# Patient Record
Sex: Female | Born: 1935 | Race: Black or African American | Hispanic: No | Smoking: Never smoker
Health system: Southern US, Community
[De-identification: ages and names within clinical notes are randomized; demographics above are authoritative.]

## PROBLEM LIST (undated history)

## (undated) DIAGNOSIS — M5430 Sciatica, unspecified side: Secondary | ICD-10-CM

## (undated) DIAGNOSIS — F0280 Dementia in other diseases classified elsewhere without behavioral disturbance: Secondary | ICD-10-CM

## (undated) DIAGNOSIS — M1990 Unspecified osteoarthritis, unspecified site: Secondary | ICD-10-CM

## (undated) DIAGNOSIS — H409 Unspecified glaucoma: Secondary | ICD-10-CM

## (undated) DIAGNOSIS — C50919 Malignant neoplasm of unspecified site of unspecified female breast: Secondary | ICD-10-CM

## (undated) DIAGNOSIS — R5381 Other malaise: Secondary | ICD-10-CM

## (undated) DIAGNOSIS — F329 Major depressive disorder, single episode, unspecified: Secondary | ICD-10-CM

## (undated) DIAGNOSIS — N183 Chronic kidney disease, stage 3 unspecified: Secondary | ICD-10-CM

## (undated) DIAGNOSIS — Z9181 History of falling: Secondary | ICD-10-CM

## (undated) DIAGNOSIS — K589 Irritable bowel syndrome without diarrhea: Secondary | ICD-10-CM

## (undated) DIAGNOSIS — K5730 Diverticulosis of large intestine without perforation or abscess without bleeding: Secondary | ICD-10-CM

## (undated) DIAGNOSIS — F32A Depression, unspecified: Secondary | ICD-10-CM

## (undated) DIAGNOSIS — D72819 Decreased white blood cell count, unspecified: Secondary | ICD-10-CM

## (undated) DIAGNOSIS — J069 Acute upper respiratory infection, unspecified: Secondary | ICD-10-CM

## (undated) DIAGNOSIS — L723 Sebaceous cyst: Secondary | ICD-10-CM

## (undated) DIAGNOSIS — M353 Polymyalgia rheumatica: Secondary | ICD-10-CM

## (undated) DIAGNOSIS — N3941 Urge incontinence: Secondary | ICD-10-CM

## (undated) DIAGNOSIS — E785 Hyperlipidemia, unspecified: Secondary | ICD-10-CM

## (undated) DIAGNOSIS — K5090 Crohn's disease, unspecified, without complications: Secondary | ICD-10-CM

## (undated) DIAGNOSIS — R269 Unspecified abnormalities of gait and mobility: Secondary | ICD-10-CM

## (undated) DIAGNOSIS — R42 Dizziness and giddiness: Secondary | ICD-10-CM

## (undated) DIAGNOSIS — D649 Anemia, unspecified: Secondary | ICD-10-CM

## (undated) DIAGNOSIS — I10 Essential (primary) hypertension: Secondary | ICD-10-CM

## (undated) DIAGNOSIS — K219 Gastro-esophageal reflux disease without esophagitis: Secondary | ICD-10-CM

## (undated) DIAGNOSIS — E538 Deficiency of other specified B group vitamins: Secondary | ICD-10-CM

## (undated) DIAGNOSIS — E559 Vitamin D deficiency, unspecified: Secondary | ICD-10-CM

## (undated) DIAGNOSIS — R5383 Other fatigue: Secondary | ICD-10-CM

## (undated) DIAGNOSIS — K5792 Diverticulitis of intestine, part unspecified, without perforation or abscess without bleeding: Secondary | ICD-10-CM

## (undated) DIAGNOSIS — R413 Other amnesia: Secondary | ICD-10-CM

## (undated) DIAGNOSIS — R600 Localized edema: Secondary | ICD-10-CM

## (undated) DIAGNOSIS — R635 Abnormal weight gain: Secondary | ICD-10-CM

## (undated) DIAGNOSIS — K37 Unspecified appendicitis: Secondary | ICD-10-CM

## (undated) DIAGNOSIS — M19019 Primary osteoarthritis, unspecified shoulder: Secondary | ICD-10-CM

## (undated) DIAGNOSIS — N9489 Other specified conditions associated with female genital organs and menstrual cycle: Secondary | ICD-10-CM

## (undated) HISTORY — PX: BREAST SURGERY: SHX581

## (undated) HISTORY — DX: Malignant neoplasm of unspecified site of unspecified female breast: C50.919

## (undated) HISTORY — DX: Major depressive disorder, single episode, unspecified: F32.9

## (undated) HISTORY — DX: Gastro-esophageal reflux disease without esophagitis: K21.9

## (undated) HISTORY — DX: Irritable bowel syndrome, unspecified: K58.9

## (undated) HISTORY — DX: Unspecified glaucoma: H40.9

## (undated) HISTORY — DX: Other malaise: R53.81

## (undated) HISTORY — DX: Depression, unspecified: F32.A

## (undated) HISTORY — DX: Sciatica, unspecified side: M54.30

## (undated) HISTORY — DX: Chronic kidney disease, stage 3 unspecified: N18.30

## (undated) HISTORY — DX: Essential (primary) hypertension: I10

## (undated) HISTORY — DX: Unspecified appendicitis: K37

## (undated) HISTORY — DX: Diverticulitis of intestine, part unspecified, without perforation or abscess without bleeding: K57.92

## (undated) HISTORY — DX: Vitamin D deficiency, unspecified: E55.9

## (undated) HISTORY — DX: Other specified conditions associated with female genital organs and menstrual cycle: N94.89

## (undated) HISTORY — DX: Polymyalgia rheumatica: M35.3

## (undated) HISTORY — DX: Localized edema: R60.0

## (undated) HISTORY — DX: Crohn's disease, unspecified, without complications: K50.90

## (undated) HISTORY — DX: Decreased white blood cell count, unspecified: D72.819

## (undated) HISTORY — DX: Dizziness and giddiness: R42

## (undated) HISTORY — DX: Other amnesia: R41.3

## (undated) HISTORY — DX: Dementia in other diseases classified elsewhere, unspecified severity, without behavioral disturbance, psychotic disturbance, mood disturbance, and anxiety: F02.80

## (undated) HISTORY — DX: Unspecified abnormalities of gait and mobility: R26.9

## (undated) HISTORY — DX: Urge incontinence: N39.41

## (undated) HISTORY — DX: Abnormal weight gain: R63.5

## (undated) HISTORY — DX: Acute upper respiratory infection, unspecified: J06.9

## (undated) HISTORY — DX: Primary osteoarthritis, unspecified shoulder: M19.019

## (undated) HISTORY — DX: Hyperlipidemia, unspecified: E78.5

## (undated) HISTORY — DX: Other fatigue: R53.83

## (undated) HISTORY — DX: Deficiency of other specified B group vitamins: E53.8

## (undated) HISTORY — DX: Anemia, unspecified: D64.9

## (undated) HISTORY — DX: History of falling: Z91.81

## (undated) HISTORY — DX: Chronic kidney disease, stage 3 (moderate): N18.3

## (undated) HISTORY — DX: Sebaceous cyst: L72.3

## (undated) HISTORY — DX: Diverticulosis of large intestine without perforation or abscess without bleeding: K57.30

---

## 1964-01-31 HISTORY — PX: NASAL SINUS SURGERY: SHX719

## 1970-01-30 HISTORY — PX: CHOLECYSTECTOMY: SHX55

## 1988-01-31 HISTORY — PX: CATARACT EXTRACTION: SUR2

## 2009-06-30 LAB — HM DEXA SCAN: HM Dexa Scan: NORMAL

## 2010-04-27 ENCOUNTER — Ambulatory Visit
Admission: RE | Admit: 2010-04-27 | Discharge: 2010-04-27 | Disposition: A | Discharge status: Home / Self Care | Payer: Medicare Other | Source: Ambulatory Visit | Attending: Internal Medicine | Admitting: Internal Medicine

## 2010-04-27 ENCOUNTER — Other Ambulatory Visit: Payer: Self-pay | Admitting: Internal Medicine

## 2010-04-27 DIAGNOSIS — R52 Pain, unspecified: Secondary | ICD-10-CM

## 2010-05-25 ENCOUNTER — Other Ambulatory Visit: Payer: Self-pay | Admitting: Internal Medicine

## 2010-05-25 ENCOUNTER — Ambulatory Visit
Admission: RE | Admit: 2010-05-25 | Discharge: 2010-05-25 | Disposition: A | Discharge status: Home / Self Care | Payer: Medicare Other | Source: Ambulatory Visit | Attending: Internal Medicine | Admitting: Internal Medicine

## 2010-05-25 DIAGNOSIS — M1990 Unspecified osteoarthritis, unspecified site: Secondary | ICD-10-CM

## 2010-08-26 ENCOUNTER — Other Ambulatory Visit: Payer: Self-pay | Admitting: Internal Medicine

## 2010-08-26 DIAGNOSIS — Z1231 Encounter for screening mammogram for malignant neoplasm of breast: Secondary | ICD-10-CM

## 2010-09-05 ENCOUNTER — Encounter: Payer: Medicare Other | Admitting: Oncology

## 2010-09-05 ENCOUNTER — Other Ambulatory Visit: Payer: Self-pay | Admitting: Oncology

## 2010-09-05 DIAGNOSIS — D72819 Decreased white blood cell count, unspecified: Secondary | ICD-10-CM

## 2010-09-05 LAB — HIV ANTIBODY (ROUTINE TESTING W REFLEX): HIV: NONREACTIVE

## 2010-09-05 LAB — CBC WITH DIFFERENTIAL/PLATELET
BASO%: 0.4 % (ref 0.0–2.0)
EOS%: 1.8 % (ref 0.0–7.0)
HCT: 33.2 % — ABNORMAL LOW (ref 34.8–46.6)
LYMPH%: 28.9 % (ref 14.0–49.7)
MCH: 31 pg (ref 25.1–34.0)
MCHC: 33.9 g/dL (ref 31.5–36.0)
NEUT%: 60.4 % (ref 38.4–76.8)
Platelets: 175 10*3/uL (ref 145–400)
RBC: 3.63 10*6/uL — ABNORMAL LOW (ref 3.70–5.45)
WBC: 3.6 10*3/uL — ABNORMAL LOW (ref 3.9–10.3)

## 2010-09-05 LAB — CHCC SMEAR

## 2010-09-16 ENCOUNTER — Ambulatory Visit
Admission: RE | Admit: 2010-09-16 | Discharge: 2010-09-16 | Disposition: A | Discharge status: Home / Self Care | Payer: Medicare Other | Source: Ambulatory Visit | Attending: Internal Medicine | Admitting: Internal Medicine

## 2010-09-16 DIAGNOSIS — Z1231 Encounter for screening mammogram for malignant neoplasm of breast: Secondary | ICD-10-CM

## 2010-09-21 ENCOUNTER — Other Ambulatory Visit: Payer: Self-pay | Admitting: Internal Medicine

## 2010-09-21 DIAGNOSIS — R928 Other abnormal and inconclusive findings on diagnostic imaging of breast: Secondary | ICD-10-CM

## 2010-09-27 ENCOUNTER — Ambulatory Visit
Admission: RE | Admit: 2010-09-27 | Discharge: 2010-09-27 | Disposition: A | Discharge status: Home / Self Care | Payer: Medicare Other | Source: Ambulatory Visit | Attending: Internal Medicine | Admitting: Internal Medicine

## 2010-09-27 ENCOUNTER — Other Ambulatory Visit: Payer: Self-pay | Admitting: Internal Medicine

## 2010-09-27 DIAGNOSIS — R928 Other abnormal and inconclusive findings on diagnostic imaging of breast: Secondary | ICD-10-CM

## 2010-09-27 DIAGNOSIS — R921 Mammographic calcification found on diagnostic imaging of breast: Secondary | ICD-10-CM

## 2010-09-30 ENCOUNTER — Ambulatory Visit
Admission: RE | Admit: 2010-09-30 | Discharge: 2010-09-30 | Disposition: A | Discharge status: Home / Self Care | Payer: Medicare Other | Source: Ambulatory Visit | Attending: Internal Medicine | Admitting: Internal Medicine

## 2010-09-30 ENCOUNTER — Other Ambulatory Visit: Payer: Self-pay | Admitting: Diagnostic Radiology

## 2010-09-30 DIAGNOSIS — R921 Mammographic calcification found on diagnostic imaging of breast: Secondary | ICD-10-CM

## 2010-10-04 ENCOUNTER — Other Ambulatory Visit: Payer: Self-pay | Admitting: Internal Medicine

## 2010-10-04 DIAGNOSIS — C50912 Malignant neoplasm of unspecified site of left female breast: Secondary | ICD-10-CM

## 2010-10-06 ENCOUNTER — Encounter (INDEPENDENT_AMBULATORY_CARE_PROVIDER_SITE_OTHER): Payer: Medicare Other | Admitting: Surgery

## 2010-10-19 ENCOUNTER — Ambulatory Visit (INDEPENDENT_AMBULATORY_CARE_PROVIDER_SITE_OTHER): Payer: Medicare Other | Admitting: General Surgery

## 2010-10-19 ENCOUNTER — Encounter: Payer: Medicare Other | Admitting: Oncology

## 2010-10-19 ENCOUNTER — Other Ambulatory Visit: Payer: Self-pay | Admitting: Oncology

## 2010-10-19 ENCOUNTER — Encounter (INDEPENDENT_AMBULATORY_CARE_PROVIDER_SITE_OTHER): Payer: Self-pay | Admitting: General Surgery

## 2010-10-19 ENCOUNTER — Ambulatory Visit: Payer: Medicare Other | Admitting: Physical Therapy

## 2010-10-19 ENCOUNTER — Ambulatory Visit (HOSPITAL_BASED_OUTPATIENT_CLINIC_OR_DEPARTMENT_OTHER): Payer: Medicare Other | Admitting: General Surgery

## 2010-10-19 ENCOUNTER — Encounter (HOSPITAL_BASED_OUTPATIENT_CLINIC_OR_DEPARTMENT_OTHER): Payer: Medicare Other | Admitting: Oncology

## 2010-10-19 VITALS — BP 138/77 | HR 62 | Temp 97.7°F | Resp 20 | Ht 64.0 in | Wt 173.8 lb

## 2010-10-19 DIAGNOSIS — D0510 Intraductal carcinoma in situ of unspecified breast: Secondary | ICD-10-CM

## 2010-10-19 DIAGNOSIS — D0590 Unspecified type of carcinoma in situ of unspecified breast: Secondary | ICD-10-CM

## 2010-10-19 DIAGNOSIS — D72819 Decreased white blood cell count, unspecified: Secondary | ICD-10-CM

## 2010-10-19 LAB — COMPREHENSIVE METABOLIC PANEL
ALT: 13 U/L (ref 0–35)
AST: 18 U/L (ref 0–37)
Albumin: 3.2 g/dL — ABNORMAL LOW (ref 3.5–5.2)
CO2: 27 mEq/L (ref 19–32)
Calcium: 9.2 mg/dL (ref 8.4–10.5)
Chloride: 105 mEq/L (ref 96–112)
Potassium: 4.5 mEq/L (ref 3.5–5.3)
Sodium: 140 mEq/L (ref 135–145)
Total Protein: 6.8 g/dL (ref 6.0–8.3)

## 2010-10-19 LAB — CBC WITH DIFFERENTIAL/PLATELET
BASO%: 0.3 % (ref 0.0–2.0)
EOS%: 1.5 % (ref 0.0–7.0)
HCT: 34.2 % — ABNORMAL LOW (ref 34.8–46.6)
MCH: 30.7 pg (ref 25.1–34.0)
MCHC: 33.4 g/dL (ref 31.5–36.0)
MONO#: 0.2 10*3/uL (ref 0.1–0.9)
RDW: 13.5 % (ref 11.2–14.5)
WBC: 3.3 10*3/uL — ABNORMAL LOW (ref 3.9–10.3)
lymph#: 0.9 10*3/uL (ref 0.9–3.3)

## 2010-10-19 NOTE — Patient Instructions (Signed)
DO NOT TAKE ASPIRIN OR IBUPROFEN/NAPROXEN WITHIN A WEEK BEFORE SURGERY.  The main risks of surgery are bleeding, infection, damage to other structures, and seroma (accumulation of fluid) under the incision site(s).    These complications may lead to additional procedures such as drainage of seroma/infection.  If cancer is found, you may need other surgeries to obtain negative margins or to take more lymph nodes.   Most women do accumulate fluid in the breast cavity where the specimen was removed. We do not always have to drain this fluid.  If your breast is very tense, painful, or red, then we may need to numb the skin and use a needle to aspirate the fluid.  We do provide patients with a Breast Binder.  The purpose of this is to avoid the use of tape on the sensitive tissue of the breast and to provide some compression to minimize the risk of seroma.  If the binder is uncomfortable, you may find that a tank top with a built-in shelf bra or a loose sports bra works better for you.  I recommend wearing this around the clock for the first 1-2 weeks except in the shower.    You may remove your dressings and may shower 48 hours after surgery     Many patients have some constipation in the week after surgery due to the narcotics and anesthesia.  You may need over the counter stool softeners or laxatives if you experience difficulty having bowel movements.    If the following occur, call our office at [phone]: If you have a fever over 101 or pain that is severe despite narcotics. If you have redness or drainage at the wound. If you develop persistent nausea or vomiting.  I will follow you back up in 1-4 weeks.    Please submit any paperwork about time off work/insurance forms to the front desk.

## 2010-10-19 NOTE — Progress Notes (Signed)
Chief Complaint  Patient presents with  . Breast Cancer    HISTORY: Patient is a 77 year old female who presents with a screening detected left breast mass.  She has had a prior R surgical breast biopsy, but has not had other relatives with breast cancer.  She has eliminated most of her medications.  Her menses was onset at age 14.  She is post menopausal.  She is accompanied by her daughter Michelle [name].  She does not have a palpable abnormality.  This is in the lower outer quadrant of her L breast.  Biopsy demonstrated high grade DCIS.  MRI negative for other lesions.    Past Medical History  Diagnosis Date  . Hypertension   . Hyperlipidemia   . Polymyalgia rheumatica   . GERD (gastroesophageal reflux disease)   . Irritable bowel syndrome   . Diverticulitis   . Sciatica   . Vertigo   . Edema of leg   . Glaucoma   . Appendicitis     Past Surgical History  Procedure Date  . Nasal sinus surgery 1966  . Cholecystectomy 1972  . Cataract extraction 1990  . Breast surgery     R breast lumpectomy for benign disease    Current Outpatient Prescriptions  Medication Sig Dispense Refill  . amLODipine (NORVASC) 5 MG tablet Take 5 mg by mouth daily.        Marland Kitchen aspirin 81 MG chewable tablet Chew 81 mg by mouth daily.        . calcium carbonate 200 MG capsule Take 250 mg by mouth 2 (two) times daily with a meal.        . docusate-casanthranol (PERICOLACE) 100-30 MG per capsule Take 1 capsule by mouth 2 (two) times daily.        Marland Kitchen lovastatin (MEVACOR) 20 MG tablet Take 20 mg by mouth at bedtime.        . Multiple Vitamin (MULTIVITAMIN) capsule Take 1 capsule by mouth daily.        Marland Kitchen omeprazole (PRILOSEC) 10 MG capsule Take 10 mg by mouth daily.        . psyllium (REGULOID) 0.52 G capsule Take 0.52 g by mouth 2 (two) times daily.        Marland Kitchen tolterodine (DETROL LA) 4 MG 24 hr capsule Take 4 mg by mouth 4 (four) times a week.           Allergies  Allergen Reactions  . Codeine     States she blacks  out     Family History  Problem Relation Age of Onset  . Stroke Father   . Hypertension Mother   . Stroke Mother   . Breast cancer Mother   . Heart attack Brother   . Cancer Brother   . Stroke Brother   . Heart attack Son   . Colon cancer Sister      History   Social History  . Marital Status: Widowed    Spouse Name: N/A    Number of Children: N/A  . Years of Education: N/A   Occupational History  . retired    Social History Main Topics  . Smoking status: Never Smoker   . Smokeless tobacco: None  . Alcohol Use: No  . Drug Use: None  . Sexually Active: None   Other Topics Concern  . None   Social History Narrative  . None     REVIEW OF SYSTEMS - PERTINENT POSITIVES ONLY: 12 point review of systems negative other  than HPI and PMH except for shoulder pain, incontinence, and arthritis.    EXAM: Filed Vitals:   10/19/10 1338  BP: 138/77  Pulse: 62  Temp: 97.7 F (36.5 C)  Resp: 20    Gen:  No acute distress.  Well nourished and well groomed.   Neurological: Alert and oriented to person, place, and time. Coordination normal.  Head: Normocephalic and atraumatic.  Eyes: Conjunctivae are normal. Pupils are equal, round, and reactive to light. No scleral icterus.  Neck: Normal range of motion. Neck supple. No tracheal deviation or thyromegaly present.  Cardiovascular: Normal rate, regular rhythm, normal heart sounds and intact distal pulses.  Exam reveals no gallop and no friction rub.  No murmur heard. Breast:  Symmetric ptotic breasts bilaterally.  Biopsy site hematoma at lower left breast 6 o'clock.  No axillary adenopathy.  No masses palpable in either breast.  R breast with old incision upper breast, well healed.   Respiratory: Effort normal.  No respiratory distress. No chest wall tenderness. Breath sounds normal.  No wheezes, rales or rhonchi.  GI: Soft. Bowel sounds are normal. The abdomen is soft and nontender.  There is no rebound and no guarding.    Musculoskeletal: Decreased range of motion R shoulder.  Antalgic gait.   Extremities are nontender.  Lymphadenopathy: No cervical, preauricular, postauricular or axillary adenopathy is present Skin: Skin is warm and dry. No rash noted. No diaphoresis. No erythema. No pallor. No clubbing, cyanosis, or edema.   Psychiatric: Normal mood and affect. Behavior is normal. Judgment and thought content normal.    LABORATORY RESULTS: Available labs are reviewed. CBC with mild anemia.   RADIOLOGY RESULTS: See E-Chart for most recent results.  Images and reports are reviewed.   ASSESSMENT AND PLAN: Left DCIS (ductal carcinoma in situ), lower inner quadrant L breast, +/+ HR Recommend needle localized excisional lumpectomy.    The surgical procedure was described to the patient.  I discussed the incision type and location and whether we would need radiology involved on the day of surgery with a wire marker and/or sentinel node.      The risks and benefits of the procedure were described to the patient and he/she wishes to proceed.    We discussed the risks bleeding, infection, damage to other structures, need for further procedures/surgeries.  We discussed the risk of seroma.  The patient was advised if the area in the breast in cancer, we may need to go back to surgery for additional tissue to obtain negative margins or for a lymph node biopsy. The patient was advised that these are the most common complications, but that others can occur as well.  They were advised against taking aspirin or other anti-inflammatory agents/blood thinners the week before surgery.          Maudry Diego MD Surgical Oncology, General and Endocrine Surgery [facility]      Visit Diagnoses: 1. Ductal carcinoma in situ of breast   2. Left DCIS (ductal carcinoma in situ), lower inner quadrant L breast, +/+ HR     Primary Care Physician: Doran Durand, MD

## 2010-10-19 NOTE — Assessment & Plan Note (Signed)
Recommend needle localized excisional lumpectomy.    The surgical procedure was described to the patient.  I discussed the incision type and location and whether we would need radiology involved on the day of surgery with a wire marker and/or sentinel node.      The risks and benefits of the procedure were described to the patient and he/she wishes to proceed.    We discussed the risks bleeding, infection, damage to other structures, need for further procedures/surgeries.  We discussed the risk of seroma.  The patient was advised if the area in the breast in cancer, we may need to go back to surgery for additional tissue to obtain negative margins or for a lymph node biopsy. The patient was advised that these are the most common complications, but that others can occur as well.  They were advised against taking aspirin or other anti-inflammatory agents/blood thinners the week before surgery.

## 2010-10-20 ENCOUNTER — Other Ambulatory Visit (INDEPENDENT_AMBULATORY_CARE_PROVIDER_SITE_OTHER): Payer: Self-pay | Admitting: General Surgery

## 2010-10-20 DIAGNOSIS — C50919 Malignant neoplasm of unspecified site of unspecified female breast: Secondary | ICD-10-CM

## 2010-10-25 ENCOUNTER — Encounter (HOSPITAL_COMMUNITY)
Admission: RE | Admit: 2010-10-25 | Discharge: 2010-10-25 | Disposition: A | Discharge status: Home / Self Care | Payer: Medicare Other | Source: Ambulatory Visit | Attending: General Surgery | Admitting: General Surgery

## 2010-10-25 LAB — CBC
HCT: 35.1 % — ABNORMAL LOW (ref 36.0–46.0)
MCHC: 31.9 g/dL (ref 30.0–36.0)
Platelets: 160 10*3/uL (ref 150–400)
RDW: 13.2 % (ref 11.5–15.5)
WBC: 3.8 10*3/uL — ABNORMAL LOW (ref 4.0–10.5)

## 2010-10-25 LAB — BASIC METABOLIC PANEL
BUN: 30 mg/dL — ABNORMAL HIGH (ref 6–23)
GFR calc Af Amer: 49 mL/min — ABNORMAL LOW (ref >60)
GFR calc non Af Amer: 41 mL/min — ABNORMAL LOW (ref >60)
Potassium: 4.9 mEq/L (ref 3.5–5.1)
Sodium: 141 mEq/L (ref 135–145)

## 2010-10-25 LAB — SURGICAL PCR SCREEN: MRSA, PCR: NEGATIVE

## 2010-10-26 NOTE — Progress Notes (Signed)
Quick Note:  Labs ok for surgery ______ 

## 2010-10-28 ENCOUNTER — Ambulatory Visit
Admission: RE | Admit: 2010-10-28 | Discharge: 2010-10-28 | Disposition: A | Discharge status: Home / Self Care | Payer: Medicare Other | Source: Ambulatory Visit | Attending: General Surgery | Admitting: General Surgery

## 2010-10-28 ENCOUNTER — Other Ambulatory Visit (INDEPENDENT_AMBULATORY_CARE_PROVIDER_SITE_OTHER): Payer: Self-pay | Admitting: General Surgery

## 2010-10-28 ENCOUNTER — Ambulatory Visit (HOSPITAL_COMMUNITY)
Admission: RE | Admit: 2010-10-28 | Discharge: 2010-10-28 | Disposition: A | Discharge status: Home / Self Care | Payer: Medicare Other | Source: Ambulatory Visit | Attending: General Surgery | Admitting: General Surgery

## 2010-10-28 DIAGNOSIS — C50919 Malignant neoplasm of unspecified site of unspecified female breast: Secondary | ICD-10-CM

## 2010-10-28 DIAGNOSIS — K589 Irritable bowel syndrome without diarrhea: Secondary | ICD-10-CM | POA: Insufficient documentation

## 2010-10-28 DIAGNOSIS — H409 Unspecified glaucoma: Secondary | ICD-10-CM | POA: Insufficient documentation

## 2010-10-28 DIAGNOSIS — Z01812 Encounter for preprocedural laboratory examination: Secondary | ICD-10-CM | POA: Insufficient documentation

## 2010-10-28 DIAGNOSIS — D0590 Unspecified type of carcinoma in situ of unspecified breast: Secondary | ICD-10-CM

## 2010-10-28 DIAGNOSIS — K5730 Diverticulosis of large intestine without perforation or abscess without bleeding: Secondary | ICD-10-CM | POA: Insufficient documentation

## 2010-10-28 DIAGNOSIS — Z01810 Encounter for preprocedural cardiovascular examination: Secondary | ICD-10-CM | POA: Insufficient documentation

## 2010-10-28 HISTORY — PX: BREAST LUMPECTOMY: SHX2

## 2010-11-04 ENCOUNTER — Telehealth (INDEPENDENT_AMBULATORY_CARE_PROVIDER_SITE_OTHER): Payer: Self-pay

## 2010-11-04 NOTE — Telephone Encounter (Signed)
Patient daughter called and said her mother is getting a cleaning at the dentist office and they need medical clearance from Dr. Donell Beers since she just had surgery. I told her I would fax clearance when I got it to [phone]. I paged Donell Beers but have not had a call back yet.

## 2010-11-07 ENCOUNTER — Telehealth (INDEPENDENT_AMBULATORY_CARE_PROVIDER_SITE_OTHER): Payer: Self-pay | Admitting: General Surgery

## 2010-11-07 NOTE — Telephone Encounter (Signed)
[name] [name]. DAUGHTER-IN-LAW OF PT Michelle [name] CALLED TO REQUEST NOTE FAXED RE OK FOR DENTAL CLEANING AFTER SURGERY. DR. Marilynne Halsted NOTIFIED AND SAID OK FOR DENTAL CLEANING PROCEDURE. NOTE FAXED TO [phone] PER REQUEST.

## 2010-11-14 ENCOUNTER — Other Ambulatory Visit (INDEPENDENT_AMBULATORY_CARE_PROVIDER_SITE_OTHER): Payer: Self-pay

## 2010-11-14 ENCOUNTER — Other Ambulatory Visit (INDEPENDENT_AMBULATORY_CARE_PROVIDER_SITE_OTHER): Payer: Self-pay | Admitting: General Surgery

## 2010-11-14 ENCOUNTER — Ambulatory Visit (INDEPENDENT_AMBULATORY_CARE_PROVIDER_SITE_OTHER): Payer: Medicare Other | Admitting: General Surgery

## 2010-11-14 ENCOUNTER — Encounter (INDEPENDENT_AMBULATORY_CARE_PROVIDER_SITE_OTHER): Payer: Self-pay | Admitting: General Surgery

## 2010-11-14 VITALS — BP 106/78 | HR 64 | Temp 96.8°F | Resp 14 | Ht 64.0 in | Wt 174.2 lb

## 2010-11-14 DIAGNOSIS — D0590 Unspecified type of carcinoma in situ of unspecified breast: Secondary | ICD-10-CM

## 2010-11-14 DIAGNOSIS — D0510 Intraductal carcinoma in situ of unspecified breast: Secondary | ICD-10-CM

## 2010-11-14 MED ORDER — HYDROCODONE-ACETAMINOPHEN 5-325 MG PO TABS: 1.0000 | ORAL_TABLET | Freq: Four times a day (QID) | ORAL | Status: DC | PRN

## 2010-11-14 NOTE — Progress Notes (Signed)
HISTORY: Pt doing well post operatively.  She has had significant arthritis pain in her shoulders, which is not unexpected.  She is having minimal breast pain.  She denies fever/chills.  She is not sure when her follow up is with Dr. Darnelle Catalan.  Pt was discussed at breast conference, but at that time, there was an understanding that her lateral margin was close.  This is not the case.    PERTINENT REVIEW OF SYSTEMS: See HPI.  EXAM: Head: Normocephalic and atraumatic.  Eyes:  Conjunctivae are normal. Pupils are equal, round, and reactive to light. No scleral icterus.  Resp: No respiratory distress, normal effort. Breast:  L breast without large seroma or infection.  Incision healing well.    Neurological: Alert and oriented to person, place, and time. Antalgic gait, using walker.  Skin: Skin is warm and dry. No rash noted. No diaphoretic. No erythema. No pallor.  Psychiatric: Normal mood and affect. Normal behavior. Judgment and thought content normal.     Pathology reviewed and demonstrates: High grade DCIS with comedonecrosis. Anterior margin <1 mm (from skin) Other margins negative.    ASSESSMENT AND PLAN:   Left DCIS (ductal carcinoma in situ),LIQ L breast, +/+ HR, s/p lumpectomy 10/28/2010 Surgical scars appear to be healing well. Released to Dr. Darnelle Catalan. Discussed with Dr. Mitzi Hansen and Dr. Colonel Bald.  Only close margin is anterior margin which is skin.  Lateral margin is not less than 2 mm. Follow up in 6 months.      Maudry Diego, MD Surgical Oncology, General & Endocrine Surgery [facility]  Doran Durand, MD Doran Durand, MD

## 2010-11-14 NOTE — Patient Instructions (Signed)
Follow up with Dr. Darnelle Catalan to discuss anti hormone treatment.

## 2010-11-14 NOTE — Assessment & Plan Note (Signed)
Surgical scars appear to be healing well. Released to Dr. Darnelle Catalan. Discussed with Dr. Mitzi Hansen and Dr. Colonel Bald.  Only close margin is anterior margin which is skin.  Lateral margin is not less than 2 mm. Follow up in 6 months.

## 2010-11-15 NOTE — Op Note (Signed)
  [name], Michelle [name] NO.:  [id number]  MEDICAL RECORD NO.:  [id number]  LOCATION:  [facility]                         FACILITY:  [facility]  PHYSICIAN:  Almond Lint, MD       DATE OF BIRTH:  October 02, 1920  DATE OF PROCEDURE:  10/28/2010 DATE OF DISCHARGE:                              OPERATIVE REPORT   PREOPERATIVE DIAGNOSIS:  Left breast ductal carcinoma in situ.  POSTOPERATIVE DIAGNOSIS:  Left breast ductal carcinoma in situ.  PROCEDURE:  Left needle-localized lumpectomy.  SURGEON:  Almond Lint, MD  ASSISTANT:  None.  ANESTHESIA:  General and local.  FINDINGS:  Clips and wire in the specimen.  SPECIMEN:  Left breast lumpectomy to Pathology.  ESTIMATED BLOOD LOSS:  Minimal.  COMPLICATIONS:  None known.  PROCEDURE:  [name] was identified in the holding area and taken to operating room where she was placed supine on the operating room table. General anesthesia was induced.  Her left breast was prepped and draped in sterile fashion.  Time-out was performed according to the surgical safety check list.  When all was correct, we continued.  The area where the wires were was identified and this was cut a little bit shorter to be more manageable.  The skin was anesthetized over the top of this and the skin was incised with a #10 blade.  Skin hooks were used to elevate the skin and thick flaps were created superiorly, inferiorly over thinner flaps created.  The skin overlying the mass was very thin.  The wire was identified and pulled through the skin into this incision.  This breast tissue was then elevated with 2 Allis clamps.  The curved Mayo scissors were used to take the specimen out.  The wire was not encountered.  This was taken in the shape of a cylinder.   The specimen was marked with the margin marker paint kit and then passed off the table for specimen mammogram.  Clips and the wire were in the specimen.  Radiology confirmed that this was accurate.   The posterior portion of the specimen was firm and so an additional posterior and medial margin were taken.  These were marked with a margin marker paint kit.  These were passed off the table.  Hemostasis was achieved with the cautery.  The wound was irrigated.  Large clips were placed in the cavity with one posterior, one medial, one lateral, one superior, and one inferior.  The skin was then reapproximated with 3-0 Vicryl deep dermal interrupted sutures and 4-0 Monocryl running subcuticular sutures.  The wound was cleaned, dried, and dressed with Steri-Strips, gauze, ABDs, and a breast binder.  The patient was awakened from anesthesia and taken to the PACU in stable condition.  Needle, sponge, and instrument counts were correct.     Almond Lint, MD     FB/MEDQ  D:  10/28/2010  T:  10/28/2010  Job:  [id number]  Electronically Signed by Almond Lint MD on 11/15/2010 07:38:20 PM

## 2010-11-23 ENCOUNTER — Encounter (HOSPITAL_BASED_OUTPATIENT_CLINIC_OR_DEPARTMENT_OTHER): Payer: Medicare Other | Admitting: Oncology

## 2010-11-23 DIAGNOSIS — D0590 Unspecified type of carcinoma in situ of unspecified breast: Secondary | ICD-10-CM

## 2010-11-23 DIAGNOSIS — Z170 Estrogen receptor positive status [ER+]: Secondary | ICD-10-CM

## 2010-12-05 ENCOUNTER — Other Ambulatory Visit: Payer: Medicare Other | Admitting: Lab

## 2011-01-14 ENCOUNTER — Telehealth: Payer: Self-pay | Admitting: Oncology

## 2011-01-14 NOTE — Telephone Encounter (Signed)
will mail pts [phone] to her home on 01/14/2011

## 2011-02-12 ENCOUNTER — Encounter (HOSPITAL_COMMUNITY): Payer: Self-pay

## 2011-02-12 ENCOUNTER — Other Ambulatory Visit: Payer: Self-pay

## 2011-02-12 ENCOUNTER — Emergency Department (HOSPITAL_COMMUNITY): Payer: Medicare Other

## 2011-02-12 ENCOUNTER — Observation Stay (HOSPITAL_COMMUNITY)
Admission: EM | Admit: 2011-02-12 | Discharge: 2011-02-13 | Disposition: A | Discharge status: Home / Self Care | Payer: Medicare Other | Attending: Internal Medicine | Admitting: Internal Medicine

## 2011-02-12 DIAGNOSIS — R5383 Other fatigue: Secondary | ICD-10-CM | POA: Diagnosis not present

## 2011-02-12 DIAGNOSIS — T1490XA Injury, unspecified, initial encounter: Secondary | ICD-10-CM | POA: Diagnosis not present

## 2011-02-12 DIAGNOSIS — M25559 Pain in unspecified hip: Secondary | ICD-10-CM | POA: Insufficient documentation

## 2011-02-12 DIAGNOSIS — M47817 Spondylosis without myelopathy or radiculopathy, lumbosacral region: Secondary | ICD-10-CM | POA: Insufficient documentation

## 2011-02-12 DIAGNOSIS — R262 Difficulty in walking, not elsewhere classified: Secondary | ICD-10-CM | POA: Diagnosis present

## 2011-02-12 DIAGNOSIS — M353 Polymyalgia rheumatica: Secondary | ICD-10-CM | POA: Diagnosis present

## 2011-02-12 DIAGNOSIS — W19XXXA Unspecified fall, initial encounter: Secondary | ICD-10-CM | POA: Diagnosis present

## 2011-02-12 DIAGNOSIS — N390 Urinary tract infection, site not specified: Principal | ICD-10-CM | POA: Diagnosis present

## 2011-02-12 DIAGNOSIS — Y92009 Unspecified place in unspecified non-institutional (private) residence as the place of occurrence of the external cause: Secondary | ICD-10-CM | POA: Insufficient documentation

## 2011-02-12 DIAGNOSIS — I10 Essential (primary) hypertension: Secondary | ICD-10-CM | POA: Insufficient documentation

## 2011-02-12 DIAGNOSIS — R5381 Other malaise: Secondary | ICD-10-CM | POA: Diagnosis not present

## 2011-02-12 DIAGNOSIS — E782 Mixed hyperlipidemia: Secondary | ICD-10-CM | POA: Diagnosis not present

## 2011-02-12 DIAGNOSIS — I517 Cardiomegaly: Secondary | ICD-10-CM | POA: Diagnosis not present

## 2011-02-12 DIAGNOSIS — J9819 Other pulmonary collapse: Secondary | ICD-10-CM | POA: Diagnosis not present

## 2011-02-12 DIAGNOSIS — R404 Transient alteration of awareness: Secondary | ICD-10-CM | POA: Diagnosis not present

## 2011-02-12 DIAGNOSIS — D0510 Intraductal carcinoma in situ of unspecified breast: Secondary | ICD-10-CM | POA: Diagnosis present

## 2011-02-12 HISTORY — DX: Unspecified osteoarthritis, unspecified site: M19.90

## 2011-02-12 LAB — URINALYSIS, ROUTINE W REFLEX MICROSCOPIC
Bilirubin Urine: NEGATIVE
Nitrite: POSITIVE — AB
Specific Gravity, Urine: 1.012 (ref 1.005–1.030)
Urobilinogen, UA: 0.2 mg/dL (ref 0.0–1.0)
pH: 7.5 (ref 5.0–8.0)

## 2011-02-12 LAB — URINE MICROSCOPIC-ADD ON

## 2011-02-12 LAB — CBC
HCT: 36.1 % (ref 36.0–46.0)
MCH: 30.1 pg (ref 26.0–34.0)
MCV: 93.8 fL (ref 78.0–100.0)
Platelets: 170 10*3/uL (ref 150–400)
RBC: 3.85 MIL/uL — ABNORMAL LOW (ref 3.87–5.11)
RDW: 13.2 % (ref 11.5–15.5)

## 2011-02-12 LAB — COMPREHENSIVE METABOLIC PANEL
ALT: 12 U/L (ref 0–35)
CO2: 25 mEq/L (ref 19–32)
Calcium: 10.1 mg/dL (ref 8.4–10.5)
Creatinine, Ser: 1.38 mg/dL — ABNORMAL HIGH (ref 0.50–1.10)
GFR calc Af Amer: 38 mL/min — ABNORMAL LOW (ref >90)
GFR calc non Af Amer: 33 mL/min — ABNORMAL LOW (ref >90)
Glucose, Bld: 100 mg/dL — ABNORMAL HIGH (ref 70–99)
Sodium: 139 mEq/L (ref 135–145)
Total Protein: 7.3 g/dL (ref 6.0–8.3)

## 2011-02-12 LAB — URINE CULTURE: Culture  Setup Time: 201301140157

## 2011-02-12 LAB — DIFFERENTIAL
Eosinophils Absolute: 0 10*3/uL (ref 0.0–0.7)
Eosinophils Relative: 1 % (ref 0–5)
Lymphs Abs: 1.3 10*3/uL (ref 0.7–4.0)
Monocytes Absolute: 0.4 10*3/uL (ref 0.1–1.0)

## 2011-02-12 MED ORDER — HYDROCODONE-ACETAMINOPHEN 5-325 MG PO TABS
1.0000 | ORAL_TABLET | Freq: Once | ORAL | Status: AC
  Administered 2011-02-12: 1 via ORAL
  Filled 2011-02-12: qty 1

## 2011-02-12 MED ORDER — SODIUM CHLORIDE 0.9 % IV SOLN
INTRAVENOUS | Status: DC
  Administered 2011-02-12: 125 mL/h via INTRAVENOUS

## 2011-02-12 MED ORDER — SODIUM CHLORIDE 0.9 % IV SOLN: INTRAVENOUS | Status: DC

## 2011-02-12 NOTE — H&P (Signed)
Michelle [name] is an 78 y.o. female.   PCP - [facility]. Chief Complaint: Difficulty walking. HPI: 78 year-old female with history of breast cancer status post lumpectomy last month, history of polymyalgia rheumatica off steroids (patient does not recall if she was on steroids), hypertension had a fall today in the assisted-living facility where she lives. Patient states that she tried to pick the glasses which fell on the floor. In doing so she fell on the floor but did not lose consciousness. Subsequent to which she felt very weak and not able to walk. Patient was brought to the ER and in the ER patient was found to be requiring 2 people to help  his walk and her legs give way. Patient does not have any low back pain did not have any incontinence of urine or bowel. Denies any neck pain. She does have chronic shoulder pain which she states is not new. A CT of the head and neck has been ordered by the ER physicians, results are still pending. Patient will be admitted for further observation and management. In addition patient has UTI. Patient denies any chest pain, shortness of breath, nausea vomiting, abdominal pain, dysuria discharges or diarrhea.  Past Medical History  Diagnosis Date  . Hypertension   . Hyperlipidemia   . Polymyalgia rheumatica   . GERD (gastroesophageal reflux disease)   . Irritable bowel syndrome   . Diverticulitis   . Sciatica   . Vertigo   . Edema of leg   . Glaucoma   . Appendicitis   . Arthritis     Past Surgical History  Procedure Date  . Nasal sinus surgery 1966  . Cholecystectomy 1972  . Cataract extraction 1990  . Breast surgery     R breast lumpectomy for benign disease  . Breast lumpectomy 10/28/10    left breast     Family History  Problem Relation Age of Onset  . Stroke Father   . Hypertension Mother   . Stroke Mother   . Breast cancer Mother   . Heart attack Brother   . Cancer Brother   . Stroke Brother   . Heart attack Son   .  Colon cancer Sister    Social History:  reports that she has never smoked. She has never used smokeless tobacco. She reports that she does not drink alcohol or use illicit drugs.  Allergies:  Allergies  Allergen Reactions  . Codeine     States she blacks out    Medications Prior to Admission  Medication Dose Route Frequency Provider Last Rate Last Dose  . 0.9 %  sodium chloride infusion   Intravenous Continuous Carleene Cooper III, MD 125 mL/hr at 02/12/11 1833 125 mL/hr at 02/12/11 1833  . 0.9 %  sodium chloride infusion   Intravenous Continuous Carleene Cooper III, MD      . HYDROcodone-acetaminophen [facility]) 5-325 MG per tablet 1 tablet  1 tablet Oral Once Carleene Cooper III, MD   1 tablet at 02/12/11 2246   Medications Prior to Admission  Medication Sig Dispense Refill  . aspirin 81 MG chewable tablet Chew 81 mg by mouth daily.        . bimatoprost (LUMIGAN) 0.01 % SOLN Place 1 drop into both eyes at bedtime.       . brimonidine-timolol (COMBIGAN) 0.2-0.5 % ophthalmic solution Place 1 drop into both eyes 2 (two) times daily.       . calcium carbonate 200 MG capsule Take 250 mg by  mouth 2 (two) times daily with a meal.        . dorzolamide (TRUSOPT) 2 % ophthalmic solution Place 1 drop into both eyes 2 (two) times daily.        Marland Kitchen esomeprazole (NEXIUM) 20 MG capsule Take 20 mg by mouth daily before breakfast.        . Multiple Vitamin (MULTIVITAMIN) capsule Take 1 capsule by mouth daily.        Marland Kitchen tolterodine (DETROL LA) 4 MG 24 hr capsule Take 4 mg by mouth 4 (four) times a week.       Marland Kitchen HYDROcodone-acetaminophen (NORCO) 5-325 MG per tablet Take 1 tablet by mouth every 6 (six) hours as needed.  30 tablet  0    Results for orders placed during the hospital encounter of 02/12/11 (from the past 48 hour(s))  CBC     Status: Abnormal   Collection Time   02/12/11  6:28 PM      Component Value Range Comment   WBC 5.0  4.0 - 10.5 (K/uL)    RBC 3.85 (*) 3.87 - 5.11 (MIL/uL)    Hemoglobin 11.6  (*) 12.0 - 15.0 (g/dL)    HCT 21.3  08.6 - 57.8 (%)    MCV 93.8  78.0 - 100.0 (fL)    MCH 30.1  26.0 - 34.0 (pg)    MCHC 32.1  30.0 - 36.0 (g/dL)    RDW 46.9  62.9 - 52.8 (%)    Platelets 170  150 - 400 (K/uL)   DIFFERENTIAL     Status: Normal   Collection Time   02/12/11  6:28 PM      Component Value Range Comment   Neutrophils Relative 66  43 - 77 (%)    Neutro Abs 3.3  1.7 - 7.7 (K/uL)    Lymphocytes Relative 26  12 - 46 (%)    Lymphs Abs 1.3  0.7 - 4.0 (K/uL)    Monocytes Relative 8  3 - 12 (%)    Monocytes Absolute 0.4  0.1 - 1.0 (K/uL)    Eosinophils Relative 1  0 - 5 (%)    Eosinophils Absolute 0.0  0.0 - 0.7 (K/uL)    Basophils Relative 0  0 - 1 (%)    Basophils Absolute 0.0  0.0 - 0.1 (K/uL)   COMPREHENSIVE METABOLIC PANEL     Status: Abnormal   Collection Time   02/12/11  6:28 PM      Component Value Range Comment   Sodium 139  135 - 145 (mEq/L)    Potassium 4.9  3.5 - 5.1 (mEq/L)    Chloride 105  96 - 112 (mEq/L)    CO2 25  19 - 32 (mEq/L)    Glucose, Bld 100 (*) 70 - 99 (mg/dL)    BUN 34 (*) 6 - 23 (mg/dL)    Creatinine, Ser 4.13 (*) 0.50 - 1.10 (mg/dL)    Calcium 24.4  8.4 - 10.5 (mg/dL)    Total Protein 7.3  6.0 - 8.3 (g/dL)    Albumin 3.8  3.5 - 5.2 (g/dL)    AST 21  0 - 37 (U/L)    ALT 12  0 - 35 (U/L)    Alkaline Phosphatase 63  39 - 117 (U/L)    Total Bilirubin 0.5  0.3 - 1.2 (mg/dL)    GFR calc non Af Amer 33 (*) >90 (mL/min)    GFR calc Af Amer 38 (*) >90 (mL/min)   URINALYSIS, ROUTINE W  REFLEX MICROSCOPIC     Status: Abnormal   Collection Time   02/12/11  7:06 PM      Component Value Range Comment   Color, Urine YELLOW  YELLOW     APPearance CLOUDY (*) CLEAR     Specific Gravity, Urine 1.012  1.005 - 1.030     pH 7.5  5.0 - 8.0     Glucose, UA NEGATIVE  NEGATIVE (mg/dL)    Hgb urine dipstick NEGATIVE  NEGATIVE     Bilirubin Urine NEGATIVE  NEGATIVE     Ketones, ur NEGATIVE  NEGATIVE (mg/dL)    Protein, ur NEGATIVE  NEGATIVE (mg/dL)     Urobilinogen, UA 0.2  0.0 - 1.0 (mg/dL)    Nitrite POSITIVE (*) NEGATIVE     Leukocytes, UA LARGE (*) NEGATIVE    URINE MICROSCOPIC-ADD ON     Status: Abnormal   Collection Time   02/12/11  7:06 PM      Component Value Range Comment   Squamous Epithelial / LPF FEW (*) RARE     WBC, UA 11-20  <3 (WBC/hpf)    Bacteria, UA MANY (*) RARE     Dg Chest 1 View  02/12/2011  *RADIOLOGY REPORT*  Clinical Data: Fall, right hip pain  CHEST - 1 VIEW  Comparison: 04/27/2010  Findings: Heart is enlarged with vascular congestion and basilar atelectasis.  Low lung volumes noted.  No definite CHF, pneumonia, collapse, consolidation, effusion, or pneumothorax.  Degenerative changes of the spine.  Atherosclerosis of the aorta.  IMPRESSION: Cardiomegaly with vascular congestion.  Low lung volumes.  Original Report Authenticated By: Judie Petit. Ruel Favors, M.D.   Dg Hip Complete Right  02/12/2011  *RADIOLOGY REPORT*  Clinical Data: Fall, right hip pain  RIGHT HIP - COMPLETE 2+ VIEW  Comparison: None.  Findings: Mild degenerative changes of the lower lumbar spine, pelvis and hips.  Symmetric hips and normal alignment.  No acute displaced fracture or trabecular pattern disruption.  Pelvis intact.  No diastasis.  IMPRESSION: No acute finding by plain radiography.  Original Report Authenticated By: Judie Petit. Ruel Favors, M.D.    Review of Systems  HENT: Negative.   Eyes: Negative.   Respiratory: Negative.   Cardiovascular: Negative.   Gastrointestinal: Negative.   Musculoskeletal: Positive for joint pain.       Both shoulder hurts on abducting.  Skin: Negative.   Neurological: Positive for weakness.  Psychiatric/Behavioral: Negative.     Blood pressure 174/84, pulse 60, temperature 97.3 F (36.3 C), temperature source Oral, resp. rate 13, height 5\' 4"  (1.626 m), weight 79.833 kg (176 lb), SpO2 100.00%. Physical Exam  Constitutional: She is oriented to person, place, and time. She appears well-developed and well-nourished.  No distress.  HENT:  Head: Normocephalic and atraumatic.  Right Ear: External ear normal.  Left Ear: External ear normal.  Nose: Nose normal.  Mouth/Throat: Oropharynx is clear and moist. No oropharyngeal exudate.  Eyes: Conjunctivae and EOM are normal. Pupils are equal, round, and reactive to light. Right eye exhibits no discharge. Left eye exhibits no discharge. No scleral icterus.  Neck: Normal range of motion. Neck supple.  Cardiovascular: Normal rate, regular rhythm and normal heart sounds.   Respiratory: Effort normal and breath sounds normal. No respiratory distress. She has no wheezes. She has no rales.  GI: Soft. Bowel sounds are normal. She exhibits no distension. There is no tenderness. There is no rebound.  Musculoskeletal: She exhibits no edema.       Both shoulders have  pain on abducting. Patient has good strength in all extremities.  Neurological: She is alert and oriented to person, place, and time. She has normal reflexes.       Moves all extremities. Upper extremity movements are limited by shoulder pain. Patient states the shoulder pain are chronic. I don't see any effusion in the joints. There is no local tenderness. Patient has no obvious facial asymmetry. Patient has got good reflexes.  Skin: Skin is warm and dry. No rash noted. She is not diaphoretic. No erythema.  Psychiatric: Her behavior is normal.     Assessment/Plan #1. Difficulty walking - patient has good reflexes. The CT head and CT C-spine results are still pending, results have to be followed. Given her history of breast cancer we have to rule out myositis (like dermatomyositis/polymyositis) so we will check a CK level, and given the history of polymyalgia rheumatica we will check a sedimentation rate and CRP levels. For now will hydrate patient and also get a PT OT consult. Further recommendations based on the tests results. #2. UTI - continue ceftriaxone check urine culture. #3. History of hypertension -  continue present medications.  CODE STATUS - full code.  Eduard Clos. 02/12/2011, 11:41 PM

## 2011-02-12 NOTE — ED Notes (Signed)
Pt started c/o rt hip pain when the ERMD was examining the patient

## 2011-02-12 NOTE — ED Notes (Signed)
Patient report given to [facility], RN.  Patient moved to [place].

## 2011-02-12 NOTE — ED Provider Notes (Cosign Needed Addendum)
History     CSN: [id number]  Arrival date & time 02/12/11  1742   First MD Initiated Contact with Patient 02/12/11 1804      Chief Complaint  Patient presents with  . Weakness  . Fall    (Consider location/radiation/quality/duration/timing/severity/associated sxs/prior treatment) HPI Comments: The patient is an 78 year old woman who said that she fell on the floor. She could not get up. She called EMS, who helped her get up. When she got up she couldn't stand because she was shaking. She says she fell on her right hip, and has mild pain over the right hip. EMS brought her to [facility] for evaluation. There was no loss of consciousness. She had no chest pain, unilateral weakness, or difficulty with speech. There was no prior history of falls.  Patient is a 78 y.o. female presenting with fall. The history is provided by the patient. No language interpreter was used.  Fall The accident occurred less than 1 hour ago. The fall occurred while walking. She fell from a height of 1 to 2 ft. She landed on carpet. There was no blood loss. The point of impact was the right hip. The pain is present in the right hip. The pain is mild. She was not ambulatory at the scene. There was no entrapment after the fall. There was no drug use involved in the accident. There was no alcohol use involved in the accident. Pertinent negatives include no fever. The symptoms are aggravated by standing. Prehospitalization: Transported to ED by EMS.     Past Medical History  Diagnosis Date  . Hypertension   . Hyperlipidemia   . Polymyalgia rheumatica   . GERD (gastroesophageal reflux disease)   . Irritable bowel syndrome   . Diverticulitis   . Sciatica   . Vertigo   . Edema of leg   . Glaucoma   . Appendicitis   . Arthritis     Past Surgical History  Procedure Date  . Nasal sinus surgery 1966  . Cholecystectomy 1972  . Cataract extraction 1990  . Breast surgery     R breast lumpectomy for benign  disease  . Breast lumpectomy 10/28/10    left breast     Family History  Problem Relation Age of Onset  . Stroke Father   . Hypertension Mother   . Stroke Mother   . Breast cancer Mother   . Heart attack Brother   . Cancer Brother   . Stroke Brother   . Heart attack Son   . Colon cancer Sister     History  Substance Use Topics  . Smoking status: Never Smoker   . Smokeless tobacco: Never Used  . Alcohol Use: No    OB History    Grav Para Term Preterm Abortions TAB SAB Ect Mult Living                  Review of Systems  Constitutional: Negative for fever.  HENT: Negative.   Eyes: Negative.   Respiratory: Negative.   Cardiovascular: Negative.   Gastrointestinal: Negative.   Musculoskeletal:       Right hip pain.  Skin: Negative.   Neurological: Positive for weakness.  Psychiatric/Behavioral: Negative.     Allergies  Codeine  Home Medications   Current Outpatient Rx  Name Route Sig Dispense Refill  . AMLODIPINE BESYLATE 2.5 MG PO TABS Oral Take 5 mg by mouth daily.    . ASPIRIN 81 MG PO CHEW Oral Chew 81 mg  by mouth daily.      Marland Kitchen BIMATOPROST 0.01 % OP SOLN  1 drop at bedtime.      Marland Kitchen BRIMONIDINE TARTRATE-TIMOLOL 0.2-0.5 % OP SOLN Both Eyes Place 1 drop into both eyes 2 (two) times daily.     Marland Kitchen CITALOPRAM HYDROBROMIDE 10 MG PO TABS Oral Take by mouth daily. Pt does not remember the dose    . DORZOLAMIDE HCL 2 % OP SOLN Both Eyes Place 1 drop into both eyes 2 (two) times daily.      Marland Kitchen ESOMEPRAZOLE MAGNESIUM 20 MG PO CPDR Oral Take 20 mg by mouth daily before breakfast.      . LETROZOLE 2.5 MG PO TABS Oral Take by mouth daily. Pt does not remember    . MULTIVITAMINS PO CAPS Oral Take 1 capsule by mouth daily.      . TOLTERODINE TARTRATE ER 4 MG PO CP24 Oral Take 4 mg by mouth 4 (four) times a week.     Marland Kitchen CALCIUM CARBONATE 200 MG PO CAPS Oral Take 250 mg by mouth 2 (two) times daily with a meal.      . HYDROCODONE-ACETAMINOPHEN 5-325 MG PO TABS Oral Take 1 tablet  by mouth every 6 (six) hours as needed. 30 tablet 0    BP 171/78  Pulse 60  Temp(Src) 98.3 F (36.8 C) (Oral)  Resp 18  Ht 5\' 4"  (1.626 m)  Wt 176 lb (79.833 kg)  BMI 30.21 kg/m2  SpO2 100%  Physical Exam  Nursing note and vitals reviewed. Constitutional: She is oriented to person, place, and time.       Is an elderly lady who speaks with a mild [place] accident. She is in no distress at rest.  HENT:  Head: Normocephalic and atraumatic.  Right Ear: External ear normal.  Left Ear: External ear normal.  Nose: Nose normal.  Mouth/Throat: Oropharynx is clear and moist.  Eyes: Conjunctivae and EOM are normal. Pupils are equal, round, and reactive to light.  Neck: Normal range of motion. Neck supple.  Cardiovascular: Normal rate, regular rhythm and normal heart sounds.   Pulmonary/Chest: Effort normal and breath sounds normal.  Abdominal: Soft. Bowel sounds are normal. She exhibits no distension. There is no tenderness.  Musculoskeletal:       She localizes pain over the right greater trochanter. Range of motion of the hip is full, without apparent pain. She has no bony deformity of her arms, thighs, lower legs ankles or feet.  Neurological: She is alert and oriented to person, place, and time.       No sensory or motor deficit.  Skin: Skin is warm and dry.  Psychiatric: She has a normal mood and affect. Her behavior is normal.    ED Course  Procedures (including critical care time)   Labs Reviewed  CBC  DIFFERENTIAL  COMPREHENSIVE METABOLIC PANEL  URINALYSIS, ROUTINE W REFLEX MICROSCOPIC  URINE CULTURE   6:20 PM Patient was seen and had physical examination. Laboratory tests and x-rays were ordered. Old charts were reviewed; she had had a left lumpectomy for carcinoma in situ of the breast last Fall.  7:08 PM  Date: 02/12/2011  Rate: 57  Rhythm: normal sinus rhythm  QRS Axis: left  Intervals: normal QRS:  Left ventricular hypertrophy  ST/T Wave abnormalities:  normal  Conduction Disutrbances:none  Narrative Interpretation: Abnormal EKG  Old EKG Reviewed: unchanged  9:03 PM Results for orders placed during the hospital encounter of 02/12/11  CBC  Component Value Range   WBC 5.0  4.0 - 10.5 (K/uL)   RBC 3.85 (*) 3.87 - 5.11 (MIL/uL)   Hemoglobin 11.6 (*) 12.0 - 15.0 (g/dL)   HCT 11.9  14.7 - 82.9 (%)   MCV 93.8  78.0 - 100.0 (fL)   MCH 30.1  26.0 - 34.0 (pg)   MCHC 32.1  30.0 - 36.0 (g/dL)   RDW 56.2  13.0 - 86.5 (%)   Platelets 170  150 - 400 (K/uL)  DIFFERENTIAL      Component Value Range   Neutrophils Relative 66  43 - 77 (%)   Neutro Abs 3.3  1.7 - 7.7 (K/uL)   Lymphocytes Relative 26  12 - 46 (%)   Lymphs Abs 1.3  0.7 - 4.0 (K/uL)   Monocytes Relative 8  3 - 12 (%)   Monocytes Absolute 0.4  0.1 - 1.0 (K/uL)   Eosinophils Relative 1  0 - 5 (%)   Eosinophils Absolute 0.0  0.0 - 0.7 (K/uL)   Basophils Relative 0  0 - 1 (%)   Basophils Absolute 0.0  0.0 - 0.1 (K/uL)  COMPREHENSIVE METABOLIC PANEL      Component Value Range   Sodium 139  135 - 145 (mEq/L)   Potassium 4.9  3.5 - 5.1 (mEq/L)   Chloride 105  96 - 112 (mEq/L)   CO2 25  19 - 32 (mEq/L)   Glucose, Bld 100 (*) 70 - 99 (mg/dL)   BUN 34 (*) 6 - 23 (mg/dL)   Creatinine, Ser 7.84 (*) 0.50 - 1.10 (mg/dL)   Calcium 69.6  8.4 - 10.5 (mg/dL)   Total Protein 7.3  6.0 - 8.3 (g/dL)   Albumin 3.8  3.5 - 5.2 (g/dL)   AST 21  0 - 37 (U/L)   ALT 12  0 - 35 (U/L)   Alkaline Phosphatase 63  39 - 117 (U/L)   Total Bilirubin 0.5  0.3 - 1.2 (mg/dL)   GFR calc non Af Amer 33 (*) >90 (mL/min)   GFR calc Af Amer 38 (*) >90 (mL/min)  URINALYSIS, ROUTINE W REFLEX MICROSCOPIC      Component Value Range   Color, Urine YELLOW  YELLOW    APPearance CLOUDY (*) CLEAR    Specific Gravity, Urine 1.012  1.005 - 1.030    pH 7.5  5.0 - 8.0    Glucose, UA NEGATIVE  NEGATIVE (mg/dL)   Hgb urine dipstick NEGATIVE  NEGATIVE    Bilirubin Urine NEGATIVE  NEGATIVE    Ketones, ur NEGATIVE   NEGATIVE (mg/dL)   Protein, ur NEGATIVE  NEGATIVE (mg/dL)   Urobilinogen, UA 0.2  0.0 - 1.0 (mg/dL)   Nitrite POSITIVE (*) NEGATIVE    Leukocytes, UA LARGE (*) NEGATIVE   URINE MICROSCOPIC-ADD ON      Component Value Range   Squamous Epithelial / LPF FEW (*) RARE    WBC, UA 11-20  <3 (WBC/hpf)   Bacteria, UA MANY (*) RARE    Dg Chest 1 View  02/12/2011  *RADIOLOGY REPORT*  Clinical Data: Fall, right hip pain  CHEST - 1 VIEW  Comparison: 04/27/2010  Findings: Heart is enlarged with vascular congestion and basilar atelectasis.  Low lung volumes noted.  No definite CHF, pneumonia, collapse, consolidation, effusion, or pneumothorax.  Degenerative changes of the spine.  Atherosclerosis of the aorta.  IMPRESSION: Cardiomegaly with vascular congestion.  Low lung volumes.  Original Report Authenticated By: Judie Petit. Ruel Favors, M.D.   Dg Hip Complete Right  02/12/2011  *RADIOLOGY REPORT*  Clinical Data: Fall, right hip pain  RIGHT HIP - COMPLETE 2+ VIEW  Comparison: None.  Findings: Mild degenerative changes of the lower lumbar spine, pelvis and hips.  Symmetric hips and normal alignment.  No acute displaced fracture or trabecular pattern disruption.  Pelvis intact.  No diastasis.  IMPRESSION: No acute finding by plain radiography.  Original Report Authenticated By: Judie Petit. Ruel Favors, M.D.    Lab tests showed a urinary tract infection. X-ray showed no fracture.  Will ask for orthostatic VS and to have pt walk.  If she can walk all right, can go home on po antibiotics.   9:22 PM Pt unable to stand, RN thinks due to weakness.  Will order IV fluids and work towards admission.   1. Fall   2. Urinary tract infection           Carleene Cooper III, MD 02/12/11 1827  Carleene Cooper III, MD 02/12/11 [phone]

## 2011-02-12 NOTE — ED Notes (Signed)
EMS claimed that the patient was found on the floor. Pt used her  alert button and initially just wanted to be helped off the floor but patient was unable to move as normal. Pt A/A/Ox4, denies any dizziness, no LOC no chest pain

## 2011-02-12 NOTE — ED Notes (Signed)
Attempted to give report to [place].  Will call back in 15 minutes.

## 2011-02-12 NOTE — ED Notes (Signed)
MD at bedside. 

## 2011-02-12 NOTE — ED Notes (Signed)
Dr.Davidson given copy of ecg. 

## 2011-02-13 ENCOUNTER — Encounter (HOSPITAL_COMMUNITY): Payer: Self-pay | Admitting: *Deleted

## 2011-02-13 DIAGNOSIS — R262 Difficulty in walking, not elsewhere classified: Secondary | ICD-10-CM | POA: Diagnosis not present

## 2011-02-13 DIAGNOSIS — I10 Essential (primary) hypertension: Secondary | ICD-10-CM | POA: Diagnosis not present

## 2011-02-13 DIAGNOSIS — E782 Mixed hyperlipidemia: Secondary | ICD-10-CM | POA: Diagnosis not present

## 2011-02-13 DIAGNOSIS — M353 Polymyalgia rheumatica: Secondary | ICD-10-CM | POA: Diagnosis not present

## 2011-02-13 DIAGNOSIS — W19XXXA Unspecified fall, initial encounter: Secondary | ICD-10-CM | POA: Diagnosis present

## 2011-02-13 DIAGNOSIS — N390 Urinary tract infection, site not specified: Secondary | ICD-10-CM | POA: Diagnosis present

## 2011-02-13 LAB — SEDIMENTATION RATE: Sed Rate: 40 mm/hr — ABNORMAL HIGH (ref 0–22)

## 2011-02-13 LAB — COMPREHENSIVE METABOLIC PANEL
AST: 24 U/L (ref 0–37)
Albumin: 2.9 g/dL — ABNORMAL LOW (ref 3.5–5.2)
Calcium: 9.1 mg/dL (ref 8.4–10.5)
Chloride: 107 mEq/L (ref 96–112)
Creatinine, Ser: 1.08 mg/dL (ref 0.50–1.10)
Sodium: 140 mEq/L (ref 135–145)

## 2011-02-13 LAB — CBC
HCT: 34.6 % — ABNORMAL LOW (ref 36.0–46.0)
Hemoglobin: 10.9 g/dL — ABNORMAL LOW (ref 12.0–15.0)
MCH: 29.6 pg (ref 26.0–34.0)
MCV: 94 fL (ref 78.0–100.0)
RBC: 3.68 MIL/uL — ABNORMAL LOW (ref 3.87–5.11)

## 2011-02-13 LAB — URINE CULTURE
Colony Count: >=100000
Culture  Setup Time: 201301140837

## 2011-02-13 MED ORDER — TIMOLOL MALEATE 0.5 % OP SOLN
1.0000 [drp] | Freq: Two times a day (BID) | OPHTHALMIC | Status: DC
  Administered 2011-02-13: 1 [drp] via OPHTHALMIC
  Filled 2011-02-13: qty 5

## 2011-02-13 MED ORDER — ADULT MULTIVITAMIN W/MINERALS CH
1.0000 | ORAL_TABLET | Freq: Every day | ORAL | Status: DC
  Administered 2011-02-13: 1 via ORAL
  Filled 2011-02-13: qty 1

## 2011-02-13 MED ORDER — ACETAMINOPHEN 650 MG RE SUPP: 650.0000 mg | Freq: Four times a day (QID) | RECTAL | Status: DC | PRN

## 2011-02-13 MED ORDER — TOLTERODINE TARTRATE ER 4 MG PO CP24: 4.0000 mg | ORAL_CAPSULE | ORAL | Status: DC

## 2011-02-13 MED ORDER — BIMATOPROST 0.01 % OP SOLN
1.0000 [drp] | Freq: Every day | OPHTHALMIC | Status: DC
  Filled 2011-02-13: qty 5

## 2011-02-13 MED ORDER — SODIUM CHLORIDE 0.9 % IV SOLN
INTRAVENOUS | Status: DC
  Administered 2011-02-13: 03:00:00 via INTRAVENOUS

## 2011-02-13 MED ORDER — AMLODIPINE BESYLATE 5 MG PO TABS
5.0000 mg | ORAL_TABLET | Freq: Every day | ORAL | Status: DC
  Administered 2011-02-13: 5 mg via ORAL
  Filled 2011-02-13: qty 1

## 2011-02-13 MED ORDER — LETROZOLE 2.5 MG PO TABS
2.5000 mg | ORAL_TABLET | Freq: Every day | ORAL | Status: DC
  Administered 2011-02-13: 2.5 mg via ORAL
  Filled 2011-02-13: qty 1

## 2011-02-13 MED ORDER — BRIMONIDINE TARTRATE 0.2 % OP SOLN
1.0000 [drp] | Freq: Two times a day (BID) | OPHTHALMIC | Status: DC
  Administered 2011-02-13: 1 [drp] via OPHTHALMIC
  Filled 2011-02-13: qty 5

## 2011-02-13 MED ORDER — CIPROFLOXACIN HCL 500 MG PO TABS: 500.0000 mg | ORAL_TABLET | Freq: Two times a day (BID) | ORAL | Status: AC

## 2011-02-13 MED ORDER — HYDROCODONE-ACETAMINOPHEN 5-325 MG PO TABS: 1.0000 | ORAL_TABLET | Freq: Four times a day (QID) | ORAL | Status: DC | PRN

## 2011-02-13 MED ORDER — BRIMONIDINE TARTRATE-TIMOLOL 0.2-0.5 % OP SOLN: 1.0000 [drp] | Freq: Two times a day (BID) | OPHTHALMIC | Status: DC

## 2011-02-13 MED ORDER — ONDANSETRON HCL 4 MG/2ML IJ SOLN: 4.0000 mg | Freq: Four times a day (QID) | INTRAMUSCULAR | Status: DC | PRN

## 2011-02-13 MED ORDER — CITALOPRAM HYDROBROMIDE 10 MG PO TABS
10.0000 mg | ORAL_TABLET | Freq: Every day | ORAL | Status: DC
  Administered 2011-02-13: 10 mg via ORAL
  Filled 2011-02-13: qty 1

## 2011-02-13 MED ORDER — MULTIVITAMINS PO CAPS: 1.0000 | ORAL_CAPSULE | Freq: Every day | ORAL | Status: DC

## 2011-02-13 MED ORDER — DORZOLAMIDE HCL 2 % OP SOLN
1.0000 [drp] | Freq: Two times a day (BID) | OPHTHALMIC | Status: DC
  Administered 2011-02-13: 1 [drp] via OPHTHALMIC
  Filled 2011-02-13: qty 10

## 2011-02-13 MED ORDER — CITALOPRAM HYDROBROMIDE 10 MG PO TABS: 10.0000 mg | ORAL_TABLET | Freq: Every day | ORAL | Status: DC

## 2011-02-13 MED ORDER — PANTOPRAZOLE SODIUM 40 MG PO TBEC
40.0000 mg | DELAYED_RELEASE_TABLET | Freq: Every day | ORAL | Status: DC
  Administered 2011-02-13: 40 mg via ORAL
  Filled 2011-02-13: qty 1

## 2011-02-13 MED ORDER — ACETAMINOPHEN 325 MG PO TABS: 650.0000 mg | ORAL_TABLET | Freq: Four times a day (QID) | ORAL | Status: DC | PRN

## 2011-02-13 MED ORDER — DEXTROSE 5 % IV SOLN
1.0000 g | Freq: Every day | INTRAVENOUS | Status: DC
  Administered 2011-02-13: 1 g via INTRAVENOUS
  Filled 2011-02-13 (×2): qty 10

## 2011-02-13 MED ORDER — ONDANSETRON HCL 4 MG PO TABS: 4.0000 mg | ORAL_TABLET | Freq: Four times a day (QID) | ORAL | Status: DC | PRN

## 2011-02-13 NOTE — Progress Notes (Signed)
   CARE MANAGEMENT NOTE 02/13/2011  Patient:  Michelle [name], Michelle [name]   Account Number:  [id number]  Date Initiated:  02/13/2011  Documentation initiated by:  Alvira Philips Assessment:   78 yr-old female adm with UTI/fall; lives alone @ [place], has paid caregivers through [facility].     Action/Plan:   Anticipated DC Date:  02/13/2011   Anticipated DC Plan:  HOME W HOME HEALTH SERVICES      DC Planning Services  CM consult      [facility] Choice  HOME HEALTH   Choice offered to / List presented to:  C-4 Adult Children        HH arranged  HH-2 PT      [facility] agency  [facility]   Status of service:  Completed, signed off Medicare Important Message given?   (If response is "NO", the following Medicare IM given date fields will be blank) Date Medicare IM given:   Date Additional Medicare IM given:    Discharge Disposition:  HOME W HOME HEALTH SERVICES  Per UR Regulation:  Reviewed for med. necessity/level of care/duration of stay  Comments:  PCP:  Dr. Doran Durand with [facility]  01/13/12 1420 [address] RN MSN CCM Per aide from [employer], they were providing care previously from 8a-12p and 5p-12a but will now provide 24/7 care.  Pt will need home health PT - provided list to pt but she wants daughter to choose agency.  Daughter to call CM upon arrival @ hospital. 1450 Discussed home health services, [facility] liaison notified, documents provided per request.

## 2011-02-13 NOTE — Progress Notes (Signed)
Patient is being discharged from PT/ OT/ SLP services secondary to:   Pt discharged to home by MD prior to PT arrival.    Theron Arista L. Journie Howson DPT [phone]

## 2011-02-13 NOTE — Discharge Summary (Signed)
Patient ID: Michelle [name] MRN: [id number] DOB/AGE: 1920/06/04 78 y.o.  Admit date: 02/12/2011 Discharge date: 02/13/2011  Primary Care Physician:  Doran Durand, MD  Discharge Diagnoses:    Present on Admission:  .Difficulty walking .UTI (lower urinary tract infection) .Polymyalgia rheumatica .HTN (hypertension) .Left DCIS (ductal carcinoma in situ),LIQ L breast, +/+ HR, s/p lumpectomy 10/28/2010, TIS .Fall .UTI (lower urinary tract infection)  Principal Problem:  *Fall Active Problems:  UTI (lower urinary tract infection)  Difficulty walking  Polymyalgia rheumatica  HTN (hypertension)   Current Discharge Medication List    START taking these medications   Details  ciprofloxacin (CIPRO) 500 MG tablet Take 1 tablet (500 mg total) by mouth 2 (two) times daily. Qty: 10 tablet, Refills: 0      CONTINUE these medications which have CHANGED   Details  HYDROcodone-acetaminophen (NORCO) 5-325 MG per tablet Take 1 tablet by mouth every 6 (six) hours as needed. Qty: 45 tablet, Refills: 0      CONTINUE these medications which have NOT CHANGED   Details  amLODipine (NORVASC) 2.5 MG tablet Take 5 mg by mouth daily.    aspirin 81 MG chewable tablet Chew 81 mg by mouth daily.      bimatoprost (LUMIGAN) 0.01 % SOLN Place 1 drop into both eyes at bedtime.     brimonidine-timolol (COMBIGAN) 0.2-0.5 % ophthalmic solution Place 1 drop into both eyes 2 (two) times daily.     calcium carbonate 200 MG capsule Take 250 mg by mouth 2 (two) times daily with a meal.      dorzolamide (TRUSOPT) 2 % ophthalmic solution Place 1 drop into both eyes 2 (two) times daily.      esomeprazole (NEXIUM) 20 MG capsule Take 20 mg by mouth daily before breakfast.      letrozole (FEMARA) 2.5 MG tablet Take by mouth daily. Pt does not remember    Multiple Vitamin (MULTIVITAMIN) capsule Take 1 capsule by mouth daily.      tolterodine (DETROL LA) 4 MG 24 hr capsule Take 4 mg by mouth 4 (four)  times a week.       STOP taking these medications     citalopram (CELEXA) 10 MG tablet         Disposition and Follow-up: with PCP in 1 week  Consults:   1. Physical therapy  Significant Diagnostic Studies:  No results found.  Brief H and P: 78 year-old female with history of breast cancer status post lumpectomy last month, history of polymyalgia rheumatica off steroids (patient does not recall if she was on steroids), hypertension had a fall today in the assisted-living facility where she lives. Patient states that she tried to pick the glasses which fell on the floor. In doing so she fell on the floor but did not lose consciousness. Subsequent to which she felt very weak and not able to walk. Patient was brought to the ER and in the ER patient was found to be requiring 2 people to help his walk and her legs give way. Patient does not have any low back pain did not have any incontinence of urine or bowel. Denies any neck pain. She does have chronic shoulder pain which she states is not new. A CT of the head and neck has been ordered by the ER physicians, results are still pending. Patient will be admitted for further observation and management. In addition patient has UTI. Patient denies any chest pain, shortness of breath, nausea vomiting, abdominal pain, dysuria discharges or diarrhea.  Physical Exam on Discharge:  Filed Vitals:   02/12/11 2139 02/13/11 0100 02/13/11 0500 02/13/11 1000  BP: 174/84 183/72 152/75 148/76  Pulse: 60 55 58 60  Temp: 97.3 F (36.3 C) 98.5 F (36.9 C) 98.3 F (36.8 C) 98.6 F (37 C)  TempSrc: Oral Oral Oral Oral  Resp: 13 16 16 18   Height:  5\' 4"  (1.626 m)    Weight:  77.6 kg (171 lb 1.2 oz)    SpO2: 100% 96% 94% 95%     Intake/Output Summary (Last 24 hours) at 02/13/11 1337 Last data filed at 02/13/11 0900  Gross per 24 hour  Intake    240 ml  Output      0 ml  Net    240 ml    General: Alert, awake, oriented x3, in no acute  distress. HEENT: No bruits, no goiter. Heart: Regular rate and rhythm, without murmurs, rubs, gallops. Lungs: Clear to auscultation bilaterally. Abdomen: Soft, nontender, nondistended, positive bowel sounds. Extremities: No clubbing cyanosis or edema with positive pedal pulses. Neuro: Grossly intact, nonfocal.  CBC:    Component Value Date/Time   WBC 3.7* 02/13/2011 0655   WBC 3.3* 10/19/2010 1235   HGB 10.9* 02/13/2011 0655   HGB 11.4* 10/19/2010 1235   HCT 34.6* 02/13/2011 0655   HCT 34.2* 10/19/2010 1235   PLT 150 02/13/2011 0655   PLT 166 10/19/2010 1235   MCV 94.0 02/13/2011 0655   MCV 92.0 10/19/2010 1235   NEUTROABS 3.3 02/12/2011 1828   NEUTROABS 2.1 10/19/2010 1235   LYMPHSABS 1.3 02/12/2011 1828   LYMPHSABS 0.9 10/19/2010 1235   MONOABS 0.4 02/12/2011 1828   MONOABS 0.2 10/19/2010 1235   EOSABS 0.0 02/12/2011 1828   EOSABS 0.0 10/19/2010 1235   BASOSABS 0.0 02/12/2011 1828   BASOSABS 0.0 10/19/2010 1235    Basic Metabolic Panel:    Component Value Date/Time   NA 140 02/13/2011 0655   K 4.1 02/13/2011 0655   CL 107 02/13/2011 0655   CO2 22 02/13/2011 0655   BUN 28* 02/13/2011 0655   CREATININE 1.08 02/13/2011 0655   GLUCOSE 93 02/13/2011 0655   CALCIUM 9.1 02/13/2011 0655    Hospital Course:   Principal Problem:   *Fall - no fractures identified on xray's - CT head negative - PT evaluation  Active Problems:   UTI (lower urinary tract infection) - continue ciprofloxacin for 5 days upon discharge   Difficulty walking - PT evaluation   HTN (hypertension) - at goal - continue home medications  Disposition - patient is medically stable to be discharged home   Time spent on Discharge: Greater than 30 minutes  Signed: Aasim Restivo 02/13/2011, 1:37 PM

## 2011-02-13 NOTE — Progress Notes (Signed)
Discussed discharge paperwork and prescriptions with pt and family member. No barriers to learning evident. Assessment unchanged from morning. IV discontinued and belongings sent with patient.

## 2011-02-13 NOTE — Progress Notes (Signed)
Utilization Review Completed.Michelle [name] T1/14/2013   

## 2011-02-14 DIAGNOSIS — R262 Difficulty in walking, not elsewhere classified: Secondary | ICD-10-CM | POA: Diagnosis not present

## 2011-02-14 DIAGNOSIS — I10 Essential (primary) hypertension: Secondary | ICD-10-CM | POA: Diagnosis not present

## 2011-02-14 DIAGNOSIS — M353 Polymyalgia rheumatica: Secondary | ICD-10-CM | POA: Diagnosis not present

## 2011-02-14 DIAGNOSIS — IMO0001 Reserved for inherently not codable concepts without codable children: Secondary | ICD-10-CM | POA: Diagnosis not present

## 2011-02-14 DIAGNOSIS — N390 Urinary tract infection, site not specified: Secondary | ICD-10-CM | POA: Diagnosis not present

## 2011-02-20 DIAGNOSIS — M353 Polymyalgia rheumatica: Secondary | ICD-10-CM | POA: Diagnosis not present

## 2011-02-20 DIAGNOSIS — R262 Difficulty in walking, not elsewhere classified: Secondary | ICD-10-CM | POA: Diagnosis not present

## 2011-02-20 DIAGNOSIS — N390 Urinary tract infection, site not specified: Secondary | ICD-10-CM | POA: Diagnosis not present

## 2011-02-20 DIAGNOSIS — I10 Essential (primary) hypertension: Secondary | ICD-10-CM | POA: Diagnosis not present

## 2011-02-20 DIAGNOSIS — IMO0001 Reserved for inherently not codable concepts without codable children: Secondary | ICD-10-CM | POA: Diagnosis not present

## 2011-02-21 ENCOUNTER — Encounter: Payer: Self-pay | Admitting: *Deleted

## 2011-02-21 DIAGNOSIS — I739 Peripheral vascular disease, unspecified: Secondary | ICD-10-CM | POA: Diagnosis not present

## 2011-02-21 DIAGNOSIS — Q828 Other specified congenital malformations of skin: Secondary | ICD-10-CM | POA: Diagnosis not present

## 2011-02-21 DIAGNOSIS — M216X9 Other acquired deformities of unspecified foot: Secondary | ICD-10-CM | POA: Diagnosis not present

## 2011-02-22 DIAGNOSIS — IMO0001 Reserved for inherently not codable concepts without codable children: Secondary | ICD-10-CM | POA: Diagnosis not present

## 2011-02-22 DIAGNOSIS — I10 Essential (primary) hypertension: Secondary | ICD-10-CM | POA: Diagnosis not present

## 2011-02-22 DIAGNOSIS — N390 Urinary tract infection, site not specified: Secondary | ICD-10-CM | POA: Diagnosis not present

## 2011-02-22 DIAGNOSIS — M353 Polymyalgia rheumatica: Secondary | ICD-10-CM | POA: Diagnosis not present

## 2011-02-22 DIAGNOSIS — R262 Difficulty in walking, not elsewhere classified: Secondary | ICD-10-CM | POA: Diagnosis not present

## 2011-02-23 DIAGNOSIS — N390 Urinary tract infection, site not specified: Secondary | ICD-10-CM | POA: Diagnosis not present

## 2011-02-23 DIAGNOSIS — R269 Unspecified abnormalities of gait and mobility: Secondary | ICD-10-CM | POA: Diagnosis not present

## 2011-02-23 DIAGNOSIS — I10 Essential (primary) hypertension: Secondary | ICD-10-CM | POA: Diagnosis not present

## 2011-02-23 DIAGNOSIS — M353 Polymyalgia rheumatica: Secondary | ICD-10-CM | POA: Diagnosis not present

## 2011-02-27 DIAGNOSIS — I10 Essential (primary) hypertension: Secondary | ICD-10-CM | POA: Diagnosis not present

## 2011-02-27 DIAGNOSIS — IMO0001 Reserved for inherently not codable concepts without codable children: Secondary | ICD-10-CM | POA: Diagnosis not present

## 2011-02-27 DIAGNOSIS — N390 Urinary tract infection, site not specified: Secondary | ICD-10-CM | POA: Diagnosis not present

## 2011-02-27 DIAGNOSIS — M353 Polymyalgia rheumatica: Secondary | ICD-10-CM | POA: Diagnosis not present

## 2011-02-27 DIAGNOSIS — R262 Difficulty in walking, not elsewhere classified: Secondary | ICD-10-CM | POA: Diagnosis not present

## 2011-02-28 DIAGNOSIS — N390 Urinary tract infection, site not specified: Secondary | ICD-10-CM | POA: Diagnosis not present

## 2011-02-28 DIAGNOSIS — M353 Polymyalgia rheumatica: Secondary | ICD-10-CM | POA: Diagnosis not present

## 2011-02-28 DIAGNOSIS — R262 Difficulty in walking, not elsewhere classified: Secondary | ICD-10-CM | POA: Diagnosis not present

## 2011-02-28 DIAGNOSIS — I10 Essential (primary) hypertension: Secondary | ICD-10-CM | POA: Diagnosis not present

## 2011-02-28 DIAGNOSIS — IMO0001 Reserved for inherently not codable concepts without codable children: Secondary | ICD-10-CM | POA: Diagnosis not present

## 2011-03-02 DIAGNOSIS — R262 Difficulty in walking, not elsewhere classified: Secondary | ICD-10-CM | POA: Diagnosis not present

## 2011-03-02 DIAGNOSIS — I10 Essential (primary) hypertension: Secondary | ICD-10-CM | POA: Diagnosis not present

## 2011-03-02 DIAGNOSIS — N390 Urinary tract infection, site not specified: Secondary | ICD-10-CM | POA: Diagnosis not present

## 2011-03-02 DIAGNOSIS — IMO0001 Reserved for inherently not codable concepts without codable children: Secondary | ICD-10-CM | POA: Diagnosis not present

## 2011-03-02 DIAGNOSIS — M353 Polymyalgia rheumatica: Secondary | ICD-10-CM | POA: Diagnosis not present

## 2011-03-06 DIAGNOSIS — IMO0001 Reserved for inherently not codable concepts without codable children: Secondary | ICD-10-CM | POA: Diagnosis not present

## 2011-03-06 DIAGNOSIS — M353 Polymyalgia rheumatica: Secondary | ICD-10-CM | POA: Diagnosis not present

## 2011-03-06 DIAGNOSIS — R262 Difficulty in walking, not elsewhere classified: Secondary | ICD-10-CM | POA: Diagnosis not present

## 2011-03-06 DIAGNOSIS — I10 Essential (primary) hypertension: Secondary | ICD-10-CM | POA: Diagnosis not present

## 2011-03-06 DIAGNOSIS — N390 Urinary tract infection, site not specified: Secondary | ICD-10-CM | POA: Diagnosis not present

## 2011-03-07 DIAGNOSIS — N390 Urinary tract infection, site not specified: Secondary | ICD-10-CM | POA: Diagnosis not present

## 2011-03-07 DIAGNOSIS — M353 Polymyalgia rheumatica: Secondary | ICD-10-CM | POA: Diagnosis not present

## 2011-03-07 DIAGNOSIS — R262 Difficulty in walking, not elsewhere classified: Secondary | ICD-10-CM | POA: Diagnosis not present

## 2011-03-07 DIAGNOSIS — I10 Essential (primary) hypertension: Secondary | ICD-10-CM | POA: Diagnosis not present

## 2011-03-07 DIAGNOSIS — IMO0001 Reserved for inherently not codable concepts without codable children: Secondary | ICD-10-CM | POA: Diagnosis not present

## 2011-03-08 ENCOUNTER — Ambulatory Visit: Payer: Medicare Other | Admitting: Oncology

## 2011-03-08 ENCOUNTER — Other Ambulatory Visit: Payer: Medicare Other | Admitting: Lab

## 2011-03-09 DIAGNOSIS — IMO0001 Reserved for inherently not codable concepts without codable children: Secondary | ICD-10-CM | POA: Diagnosis not present

## 2011-03-09 DIAGNOSIS — I10 Essential (primary) hypertension: Secondary | ICD-10-CM | POA: Diagnosis not present

## 2011-03-09 DIAGNOSIS — R262 Difficulty in walking, not elsewhere classified: Secondary | ICD-10-CM | POA: Diagnosis not present

## 2011-03-09 DIAGNOSIS — M353 Polymyalgia rheumatica: Secondary | ICD-10-CM | POA: Diagnosis not present

## 2011-03-09 DIAGNOSIS — N390 Urinary tract infection, site not specified: Secondary | ICD-10-CM | POA: Diagnosis not present

## 2011-03-11 NOTE — Progress Notes (Signed)
No show

## 2011-03-13 DIAGNOSIS — I10 Essential (primary) hypertension: Secondary | ICD-10-CM | POA: Diagnosis not present

## 2011-03-13 DIAGNOSIS — M353 Polymyalgia rheumatica: Secondary | ICD-10-CM | POA: Diagnosis not present

## 2011-03-13 DIAGNOSIS — N390 Urinary tract infection, site not specified: Secondary | ICD-10-CM | POA: Diagnosis not present

## 2011-03-13 DIAGNOSIS — H4011X0 Primary open-angle glaucoma, stage unspecified: Secondary | ICD-10-CM | POA: Diagnosis not present

## 2011-03-13 DIAGNOSIS — IMO0001 Reserved for inherently not codable concepts without codable children: Secondary | ICD-10-CM | POA: Diagnosis not present

## 2011-03-13 DIAGNOSIS — R262 Difficulty in walking, not elsewhere classified: Secondary | ICD-10-CM | POA: Diagnosis not present

## 2011-03-14 DIAGNOSIS — IMO0001 Reserved for inherently not codable concepts without codable children: Secondary | ICD-10-CM | POA: Diagnosis not present

## 2011-03-14 DIAGNOSIS — M353 Polymyalgia rheumatica: Secondary | ICD-10-CM | POA: Diagnosis not present

## 2011-03-14 DIAGNOSIS — N390 Urinary tract infection, site not specified: Secondary | ICD-10-CM | POA: Diagnosis not present

## 2011-03-14 DIAGNOSIS — I10 Essential (primary) hypertension: Secondary | ICD-10-CM | POA: Diagnosis not present

## 2011-03-14 DIAGNOSIS — R262 Difficulty in walking, not elsewhere classified: Secondary | ICD-10-CM | POA: Diagnosis not present

## 2011-03-16 DIAGNOSIS — M353 Polymyalgia rheumatica: Secondary | ICD-10-CM | POA: Diagnosis not present

## 2011-03-16 DIAGNOSIS — N390 Urinary tract infection, site not specified: Secondary | ICD-10-CM | POA: Diagnosis not present

## 2011-03-16 DIAGNOSIS — R262 Difficulty in walking, not elsewhere classified: Secondary | ICD-10-CM | POA: Diagnosis not present

## 2011-03-16 DIAGNOSIS — I10 Essential (primary) hypertension: Secondary | ICD-10-CM | POA: Diagnosis not present

## 2011-03-16 DIAGNOSIS — IMO0001 Reserved for inherently not codable concepts without codable children: Secondary | ICD-10-CM | POA: Diagnosis not present

## 2011-03-17 DIAGNOSIS — IMO0001 Reserved for inherently not codable concepts without codable children: Secondary | ICD-10-CM | POA: Diagnosis not present

## 2011-03-17 DIAGNOSIS — R262 Difficulty in walking, not elsewhere classified: Secondary | ICD-10-CM | POA: Diagnosis not present

## 2011-03-17 DIAGNOSIS — M353 Polymyalgia rheumatica: Secondary | ICD-10-CM | POA: Diagnosis not present

## 2011-03-17 DIAGNOSIS — N390 Urinary tract infection, site not specified: Secondary | ICD-10-CM | POA: Diagnosis not present

## 2011-03-17 DIAGNOSIS — I10 Essential (primary) hypertension: Secondary | ICD-10-CM | POA: Diagnosis not present

## 2011-03-20 DIAGNOSIS — I10 Essential (primary) hypertension: Secondary | ICD-10-CM | POA: Diagnosis not present

## 2011-03-20 DIAGNOSIS — N390 Urinary tract infection, site not specified: Secondary | ICD-10-CM | POA: Diagnosis not present

## 2011-03-20 DIAGNOSIS — IMO0001 Reserved for inherently not codable concepts without codable children: Secondary | ICD-10-CM | POA: Diagnosis not present

## 2011-03-20 DIAGNOSIS — M353 Polymyalgia rheumatica: Secondary | ICD-10-CM | POA: Diagnosis not present

## 2011-03-20 DIAGNOSIS — R262 Difficulty in walking, not elsewhere classified: Secondary | ICD-10-CM | POA: Diagnosis not present

## 2011-03-21 DIAGNOSIS — I10 Essential (primary) hypertension: Secondary | ICD-10-CM | POA: Diagnosis not present

## 2011-03-21 DIAGNOSIS — M353 Polymyalgia rheumatica: Secondary | ICD-10-CM | POA: Diagnosis not present

## 2011-03-21 DIAGNOSIS — R262 Difficulty in walking, not elsewhere classified: Secondary | ICD-10-CM | POA: Diagnosis not present

## 2011-03-21 DIAGNOSIS — N390 Urinary tract infection, site not specified: Secondary | ICD-10-CM | POA: Diagnosis not present

## 2011-03-21 DIAGNOSIS — IMO0001 Reserved for inherently not codable concepts without codable children: Secondary | ICD-10-CM | POA: Diagnosis not present

## 2011-03-23 DIAGNOSIS — R262 Difficulty in walking, not elsewhere classified: Secondary | ICD-10-CM | POA: Diagnosis not present

## 2011-03-23 DIAGNOSIS — N390 Urinary tract infection, site not specified: Secondary | ICD-10-CM | POA: Diagnosis not present

## 2011-03-23 DIAGNOSIS — I10 Essential (primary) hypertension: Secondary | ICD-10-CM | POA: Diagnosis not present

## 2011-03-23 DIAGNOSIS — IMO0001 Reserved for inherently not codable concepts without codable children: Secondary | ICD-10-CM | POA: Diagnosis not present

## 2011-03-23 DIAGNOSIS — M353 Polymyalgia rheumatica: Secondary | ICD-10-CM | POA: Diagnosis not present

## 2011-03-24 DIAGNOSIS — R262 Difficulty in walking, not elsewhere classified: Secondary | ICD-10-CM | POA: Diagnosis not present

## 2011-03-24 DIAGNOSIS — N390 Urinary tract infection, site not specified: Secondary | ICD-10-CM | POA: Diagnosis not present

## 2011-03-24 DIAGNOSIS — M353 Polymyalgia rheumatica: Secondary | ICD-10-CM | POA: Diagnosis not present

## 2011-03-24 DIAGNOSIS — I10 Essential (primary) hypertension: Secondary | ICD-10-CM | POA: Diagnosis not present

## 2011-03-24 DIAGNOSIS — IMO0001 Reserved for inherently not codable concepts without codable children: Secondary | ICD-10-CM | POA: Diagnosis not present

## 2011-04-06 DIAGNOSIS — N390 Urinary tract infection, site not specified: Secondary | ICD-10-CM | POA: Diagnosis not present

## 2011-04-06 DIAGNOSIS — A009 Cholera, unspecified: Secondary | ICD-10-CM | POA: Diagnosis not present

## 2011-04-06 DIAGNOSIS — I10 Essential (primary) hypertension: Secondary | ICD-10-CM | POA: Diagnosis not present

## 2011-05-15 ENCOUNTER — Ambulatory Visit (INDEPENDENT_AMBULATORY_CARE_PROVIDER_SITE_OTHER): Payer: Medicare Other | Admitting: General Surgery

## 2011-05-15 ENCOUNTER — Encounter (INDEPENDENT_AMBULATORY_CARE_PROVIDER_SITE_OTHER): Payer: Self-pay | Admitting: General Surgery

## 2011-05-15 VITALS — BP 111/75 | HR 53 | Temp 97.4°F | Ht 64.0 in | Wt 170.0 lb

## 2011-05-15 DIAGNOSIS — D0510 Intraductal carcinoma in situ of unspecified breast: Secondary | ICD-10-CM | POA: Insufficient documentation

## 2011-05-15 DIAGNOSIS — D0590 Unspecified type of carcinoma in situ of unspecified breast: Secondary | ICD-10-CM

## 2011-05-15 NOTE — Patient Instructions (Addendum)
Get mammogram in August.  Follow up in 6 months with me and as scheduled with Dr. Darnelle Catalan.

## 2011-05-15 NOTE — Progress Notes (Signed)
HISTORY: Patient has been doing reasonably well since I saw her last.  She is to have issues with unsteady gait bowling. This is unchanged. She has not had any thing happen with her breasts that she is aware of. She denies breast pain, nipple discharge, skin dimpling, new masses. She has not had a mammogram. She is taking Femara at the direction of Dr. Darnelle Catalan without issue. She is not having significant issues with her shoulder arthritis that she was on I saw her last.   PERTINENT REVIEW OF SYSTEMS: Otherwise negative.     EXAM: Head: Normocephalic and atraumatic.  Eyes:  Conjunctivae injected, and there is swelling around both eyes. Pupils are equal, round, and reactive to light. No scleral icterus.  Neck:  Normal range of motion. Neck supple. No tracheal deviation present. No thyromegaly present.  Resp: No respiratory distress, normal effort. Breast:  There are no palpable masses in either breast.  There is some thickening of the scar on the left breast that softens up when examined supine.  There is no skin dimpling or nipple retraction.   Abd:  Abdomen is soft, non distended and non tender. No masses are palpable.  There is no rebound and no guarding.  Neurological: Alert and oriented to person, place, and time. Coordination impaired. Skin: Skin is warm and dry. No rash noted. No diaphoretic. No erythema. No pallor.  Psychiatric: Normal mood and affect. Normal behavior. Judgment and thought content normal.      ASSESSMENT AND PLAN:   DCIS (ductal carcinoma in situ), s/p lumpectomy 10/28/2010 Pt due for mammograms in August 2013.  Pt continues on daily femara.  Follow up in 6 months. No clinical evidence of recurrent disease.         Maudry Diego, MD Surgical Oncology, General & Endocrine Surgery [facility]  REED,TIFFANY L., DO, DO Doran Durand, MD

## 2011-05-15 NOTE — Assessment & Plan Note (Signed)
Pt due for mammograms in August 2013.  Pt continues on daily femara.  Follow up in 6 months. No clinical evidence of recurrent disease.

## 2011-05-25 DIAGNOSIS — M19019 Primary osteoarthritis, unspecified shoulder: Secondary | ICD-10-CM | POA: Diagnosis not present

## 2011-05-25 DIAGNOSIS — K5090 Crohn's disease, unspecified, without complications: Secondary | ICD-10-CM | POA: Diagnosis not present

## 2011-05-25 DIAGNOSIS — I10 Essential (primary) hypertension: Secondary | ICD-10-CM | POA: Diagnosis not present

## 2011-05-25 DIAGNOSIS — M353 Polymyalgia rheumatica: Secondary | ICD-10-CM | POA: Diagnosis not present

## 2011-06-09 DIAGNOSIS — N9489 Other specified conditions associated with female genital organs and menstrual cycle: Secondary | ICD-10-CM | POA: Diagnosis not present

## 2011-06-14 DIAGNOSIS — L723 Sebaceous cyst: Secondary | ICD-10-CM | POA: Diagnosis not present

## 2011-07-10 DIAGNOSIS — L723 Sebaceous cyst: Secondary | ICD-10-CM | POA: Diagnosis not present

## 2011-07-13 DIAGNOSIS — R269 Unspecified abnormalities of gait and mobility: Secondary | ICD-10-CM | POA: Diagnosis not present

## 2011-07-13 DIAGNOSIS — Z9181 History of falling: Secondary | ICD-10-CM | POA: Diagnosis not present

## 2011-07-20 DIAGNOSIS — M353 Polymyalgia rheumatica: Secondary | ICD-10-CM | POA: Diagnosis not present

## 2011-07-20 DIAGNOSIS — R269 Unspecified abnormalities of gait and mobility: Secondary | ICD-10-CM | POA: Diagnosis not present

## 2011-07-20 DIAGNOSIS — I129 Hypertensive chronic kidney disease with stage 1 through stage 4 chronic kidney disease, or unspecified chronic kidney disease: Secondary | ICD-10-CM | POA: Diagnosis not present

## 2011-07-20 DIAGNOSIS — R42 Dizziness and giddiness: Secondary | ICD-10-CM | POA: Diagnosis not present

## 2011-07-21 DIAGNOSIS — M353 Polymyalgia rheumatica: Secondary | ICD-10-CM | POA: Diagnosis not present

## 2011-07-21 DIAGNOSIS — R269 Unspecified abnormalities of gait and mobility: Secondary | ICD-10-CM | POA: Diagnosis not present

## 2011-07-21 DIAGNOSIS — R42 Dizziness and giddiness: Secondary | ICD-10-CM | POA: Diagnosis not present

## 2011-07-21 DIAGNOSIS — I129 Hypertensive chronic kidney disease with stage 1 through stage 4 chronic kidney disease, or unspecified chronic kidney disease: Secondary | ICD-10-CM | POA: Diagnosis not present

## 2011-07-24 DIAGNOSIS — M353 Polymyalgia rheumatica: Secondary | ICD-10-CM | POA: Diagnosis not present

## 2011-07-24 DIAGNOSIS — I129 Hypertensive chronic kidney disease with stage 1 through stage 4 chronic kidney disease, or unspecified chronic kidney disease: Secondary | ICD-10-CM | POA: Diagnosis not present

## 2011-07-24 DIAGNOSIS — R42 Dizziness and giddiness: Secondary | ICD-10-CM | POA: Diagnosis not present

## 2011-07-24 DIAGNOSIS — R269 Unspecified abnormalities of gait and mobility: Secondary | ICD-10-CM | POA: Diagnosis not present

## 2011-07-26 DIAGNOSIS — R42 Dizziness and giddiness: Secondary | ICD-10-CM | POA: Diagnosis not present

## 2011-07-26 DIAGNOSIS — M353 Polymyalgia rheumatica: Secondary | ICD-10-CM | POA: Diagnosis not present

## 2011-07-26 DIAGNOSIS — I129 Hypertensive chronic kidney disease with stage 1 through stage 4 chronic kidney disease, or unspecified chronic kidney disease: Secondary | ICD-10-CM | POA: Diagnosis not present

## 2011-07-26 DIAGNOSIS — R269 Unspecified abnormalities of gait and mobility: Secondary | ICD-10-CM | POA: Diagnosis not present

## 2011-07-27 DIAGNOSIS — L723 Sebaceous cyst: Secondary | ICD-10-CM | POA: Diagnosis not present

## 2011-07-28 DIAGNOSIS — R42 Dizziness and giddiness: Secondary | ICD-10-CM | POA: Diagnosis not present

## 2011-07-28 DIAGNOSIS — M353 Polymyalgia rheumatica: Secondary | ICD-10-CM | POA: Diagnosis not present

## 2011-07-28 DIAGNOSIS — I129 Hypertensive chronic kidney disease with stage 1 through stage 4 chronic kidney disease, or unspecified chronic kidney disease: Secondary | ICD-10-CM | POA: Diagnosis not present

## 2011-07-28 DIAGNOSIS — R269 Unspecified abnormalities of gait and mobility: Secondary | ICD-10-CM | POA: Diagnosis not present

## 2011-07-31 DIAGNOSIS — I129 Hypertensive chronic kidney disease with stage 1 through stage 4 chronic kidney disease, or unspecified chronic kidney disease: Secondary | ICD-10-CM | POA: Diagnosis not present

## 2011-07-31 DIAGNOSIS — M353 Polymyalgia rheumatica: Secondary | ICD-10-CM | POA: Diagnosis not present

## 2011-07-31 DIAGNOSIS — R269 Unspecified abnormalities of gait and mobility: Secondary | ICD-10-CM | POA: Diagnosis not present

## 2011-07-31 DIAGNOSIS — R42 Dizziness and giddiness: Secondary | ICD-10-CM | POA: Diagnosis not present

## 2011-08-01 DIAGNOSIS — R42 Dizziness and giddiness: Secondary | ICD-10-CM | POA: Diagnosis not present

## 2011-08-01 DIAGNOSIS — R269 Unspecified abnormalities of gait and mobility: Secondary | ICD-10-CM | POA: Diagnosis not present

## 2011-08-01 DIAGNOSIS — M353 Polymyalgia rheumatica: Secondary | ICD-10-CM | POA: Diagnosis not present

## 2011-08-01 DIAGNOSIS — I129 Hypertensive chronic kidney disease with stage 1 through stage 4 chronic kidney disease, or unspecified chronic kidney disease: Secondary | ICD-10-CM | POA: Diagnosis not present

## 2011-08-02 DIAGNOSIS — M353 Polymyalgia rheumatica: Secondary | ICD-10-CM | POA: Diagnosis not present

## 2011-08-02 DIAGNOSIS — R269 Unspecified abnormalities of gait and mobility: Secondary | ICD-10-CM | POA: Diagnosis not present

## 2011-08-02 DIAGNOSIS — R42 Dizziness and giddiness: Secondary | ICD-10-CM | POA: Diagnosis not present

## 2011-08-02 DIAGNOSIS — I129 Hypertensive chronic kidney disease with stage 1 through stage 4 chronic kidney disease, or unspecified chronic kidney disease: Secondary | ICD-10-CM | POA: Diagnosis not present

## 2011-08-09 DIAGNOSIS — R269 Unspecified abnormalities of gait and mobility: Secondary | ICD-10-CM | POA: Diagnosis not present

## 2011-08-09 DIAGNOSIS — I129 Hypertensive chronic kidney disease with stage 1 through stage 4 chronic kidney disease, or unspecified chronic kidney disease: Secondary | ICD-10-CM | POA: Diagnosis not present

## 2011-08-09 DIAGNOSIS — M353 Polymyalgia rheumatica: Secondary | ICD-10-CM | POA: Diagnosis not present

## 2011-08-09 DIAGNOSIS — R42 Dizziness and giddiness: Secondary | ICD-10-CM | POA: Diagnosis not present

## 2011-08-11 DIAGNOSIS — R42 Dizziness and giddiness: Secondary | ICD-10-CM | POA: Diagnosis not present

## 2011-08-11 DIAGNOSIS — I129 Hypertensive chronic kidney disease with stage 1 through stage 4 chronic kidney disease, or unspecified chronic kidney disease: Secondary | ICD-10-CM | POA: Diagnosis not present

## 2011-08-11 DIAGNOSIS — M353 Polymyalgia rheumatica: Secondary | ICD-10-CM | POA: Diagnosis not present

## 2011-08-11 DIAGNOSIS — R269 Unspecified abnormalities of gait and mobility: Secondary | ICD-10-CM | POA: Diagnosis not present

## 2011-08-14 DIAGNOSIS — I129 Hypertensive chronic kidney disease with stage 1 through stage 4 chronic kidney disease, or unspecified chronic kidney disease: Secondary | ICD-10-CM | POA: Diagnosis not present

## 2011-08-14 DIAGNOSIS — R42 Dizziness and giddiness: Secondary | ICD-10-CM | POA: Diagnosis not present

## 2011-08-14 DIAGNOSIS — R269 Unspecified abnormalities of gait and mobility: Secondary | ICD-10-CM | POA: Diagnosis not present

## 2011-08-14 DIAGNOSIS — M353 Polymyalgia rheumatica: Secondary | ICD-10-CM | POA: Diagnosis not present

## 2011-08-15 DIAGNOSIS — R269 Unspecified abnormalities of gait and mobility: Secondary | ICD-10-CM | POA: Diagnosis not present

## 2011-08-15 DIAGNOSIS — R42 Dizziness and giddiness: Secondary | ICD-10-CM | POA: Diagnosis not present

## 2011-08-15 DIAGNOSIS — I129 Hypertensive chronic kidney disease with stage 1 through stage 4 chronic kidney disease, or unspecified chronic kidney disease: Secondary | ICD-10-CM | POA: Diagnosis not present

## 2011-08-15 DIAGNOSIS — M353 Polymyalgia rheumatica: Secondary | ICD-10-CM | POA: Diagnosis not present

## 2011-08-17 DIAGNOSIS — I129 Hypertensive chronic kidney disease with stage 1 through stage 4 chronic kidney disease, or unspecified chronic kidney disease: Secondary | ICD-10-CM | POA: Diagnosis not present

## 2011-08-17 DIAGNOSIS — M353 Polymyalgia rheumatica: Secondary | ICD-10-CM | POA: Diagnosis not present

## 2011-08-17 DIAGNOSIS — R42 Dizziness and giddiness: Secondary | ICD-10-CM | POA: Diagnosis not present

## 2011-08-17 DIAGNOSIS — R269 Unspecified abnormalities of gait and mobility: Secondary | ICD-10-CM | POA: Diagnosis not present

## 2011-08-22 DIAGNOSIS — R269 Unspecified abnormalities of gait and mobility: Secondary | ICD-10-CM | POA: Diagnosis not present

## 2011-08-22 DIAGNOSIS — I129 Hypertensive chronic kidney disease with stage 1 through stage 4 chronic kidney disease, or unspecified chronic kidney disease: Secondary | ICD-10-CM | POA: Diagnosis not present

## 2011-08-22 DIAGNOSIS — R42 Dizziness and giddiness: Secondary | ICD-10-CM | POA: Diagnosis not present

## 2011-08-22 DIAGNOSIS — M353 Polymyalgia rheumatica: Secondary | ICD-10-CM | POA: Diagnosis not present

## 2011-08-24 ENCOUNTER — Other Ambulatory Visit: Payer: Self-pay | Admitting: Internal Medicine

## 2011-08-24 DIAGNOSIS — R42 Dizziness and giddiness: Secondary | ICD-10-CM | POA: Diagnosis not present

## 2011-08-24 DIAGNOSIS — Z853 Personal history of malignant neoplasm of breast: Secondary | ICD-10-CM

## 2011-08-24 DIAGNOSIS — R269 Unspecified abnormalities of gait and mobility: Secondary | ICD-10-CM | POA: Diagnosis not present

## 2011-08-24 DIAGNOSIS — I129 Hypertensive chronic kidney disease with stage 1 through stage 4 chronic kidney disease, or unspecified chronic kidney disease: Secondary | ICD-10-CM | POA: Diagnosis not present

## 2011-08-24 DIAGNOSIS — Z9889 Other specified postprocedural states: Secondary | ICD-10-CM

## 2011-08-24 DIAGNOSIS — M353 Polymyalgia rheumatica: Secondary | ICD-10-CM | POA: Diagnosis not present

## 2011-08-29 DIAGNOSIS — M353 Polymyalgia rheumatica: Secondary | ICD-10-CM | POA: Diagnosis not present

## 2011-08-29 DIAGNOSIS — R269 Unspecified abnormalities of gait and mobility: Secondary | ICD-10-CM | POA: Diagnosis not present

## 2011-08-29 DIAGNOSIS — I129 Hypertensive chronic kidney disease with stage 1 through stage 4 chronic kidney disease, or unspecified chronic kidney disease: Secondary | ICD-10-CM | POA: Diagnosis not present

## 2011-08-29 DIAGNOSIS — R42 Dizziness and giddiness: Secondary | ICD-10-CM | POA: Diagnosis not present

## 2011-08-31 DIAGNOSIS — I129 Hypertensive chronic kidney disease with stage 1 through stage 4 chronic kidney disease, or unspecified chronic kidney disease: Secondary | ICD-10-CM | POA: Diagnosis not present

## 2011-08-31 DIAGNOSIS — R42 Dizziness and giddiness: Secondary | ICD-10-CM | POA: Diagnosis not present

## 2011-08-31 DIAGNOSIS — M353 Polymyalgia rheumatica: Secondary | ICD-10-CM | POA: Diagnosis not present

## 2011-08-31 DIAGNOSIS — R269 Unspecified abnormalities of gait and mobility: Secondary | ICD-10-CM | POA: Diagnosis not present

## 2011-09-12 ENCOUNTER — Emergency Department (HOSPITAL_COMMUNITY)
Admission: EM | Admit: 2011-09-12 | Discharge: 2011-09-12 | Disposition: A | Discharge status: Home / Self Care | Payer: Medicare Other | Attending: Emergency Medicine | Admitting: Emergency Medicine

## 2011-09-12 ENCOUNTER — Emergency Department (HOSPITAL_COMMUNITY): Payer: Medicare Other

## 2011-09-12 ENCOUNTER — Encounter (HOSPITAL_COMMUNITY): Payer: Self-pay

## 2011-09-12 DIAGNOSIS — F29 Unspecified psychosis not due to a substance or known physiological condition: Secondary | ICD-10-CM | POA: Insufficient documentation

## 2011-09-12 DIAGNOSIS — I10 Essential (primary) hypertension: Secondary | ICD-10-CM | POA: Diagnosis not present

## 2011-09-12 DIAGNOSIS — R55 Syncope and collapse: Secondary | ICD-10-CM | POA: Diagnosis not present

## 2011-09-12 DIAGNOSIS — T1490XA Injury, unspecified, initial encounter: Secondary | ICD-10-CM | POA: Diagnosis not present

## 2011-09-12 DIAGNOSIS — R51 Headache: Secondary | ICD-10-CM | POA: Diagnosis not present

## 2011-09-12 DIAGNOSIS — M545 Low back pain, unspecified: Secondary | ICD-10-CM | POA: Insufficient documentation

## 2011-09-12 DIAGNOSIS — R6889 Other general symptoms and signs: Secondary | ICD-10-CM | POA: Diagnosis not present

## 2011-09-12 DIAGNOSIS — R296 Repeated falls: Secondary | ICD-10-CM | POA: Diagnosis not present

## 2011-09-12 DIAGNOSIS — R42 Dizziness and giddiness: Secondary | ICD-10-CM | POA: Diagnosis not present

## 2011-09-12 DIAGNOSIS — J90 Pleural effusion, not elsewhere classified: Secondary | ICD-10-CM | POA: Diagnosis not present

## 2011-09-12 DIAGNOSIS — W19XXXA Unspecified fall, initial encounter: Secondary | ICD-10-CM | POA: Insufficient documentation

## 2011-09-12 DIAGNOSIS — M542 Cervicalgia: Secondary | ICD-10-CM | POA: Insufficient documentation

## 2011-09-12 LAB — CBC WITH DIFFERENTIAL/PLATELET
Basophils Absolute: 0 10*3/uL (ref 0.0–0.1)
Basophils Relative: 0 % (ref 0–1)
Eosinophils Relative: 2 % (ref 0–5)
Lymphocytes Relative: 29 % (ref 12–46)
MCHC: 31.8 g/dL (ref 30.0–36.0)
MCV: 94.4 fL (ref 78.0–100.0)
Platelets: 152 10*3/uL (ref 150–400)
RDW: 13.2 % (ref 11.5–15.5)
WBC: 3.7 10*3/uL — ABNORMAL LOW (ref 4.0–10.5)

## 2011-09-12 LAB — URINALYSIS, ROUTINE W REFLEX MICROSCOPIC
Ketones, ur: NEGATIVE mg/dL
Nitrite: POSITIVE — AB
Protein, ur: NEGATIVE mg/dL
Urobilinogen, UA: 0.2 mg/dL (ref 0.0–1.0)

## 2011-09-12 LAB — BASIC METABOLIC PANEL
CO2: 28 mEq/L (ref 19–32)
Calcium: 9.7 mg/dL (ref 8.4–10.5)
Creatinine, Ser: 1.17 mg/dL — ABNORMAL HIGH (ref 0.50–1.10)
GFR calc Af Amer: 46 mL/min — ABNORMAL LOW (ref >90)
GFR calc non Af Amer: 40 mL/min — ABNORMAL LOW (ref >90)
Sodium: 142 mEq/L (ref 135–145)

## 2011-09-12 LAB — TROPONIN I: Troponin I: <0.3 ng/mL (ref <0.30)

## 2011-09-12 MED ORDER — SODIUM CHLORIDE 0.9 % IV SOLN: INTRAVENOUS | Status: DC

## 2011-09-12 NOTE — ED Notes (Addendum)
Pt helped back into gown she arrived in. Care taker at bedside will transport back to independent living facility.

## 2011-09-12 NOTE — ED Notes (Signed)
Pt denies any neck pain at this time, reports mild discomfort to back of head where small hematoma noted. No bleeding. ROM to all extremities intact. Pt eating and drinking without any complaints of nausea or dizziness.

## 2011-09-12 NOTE — ED Notes (Signed)
Patient was brought in by ambulance S/P fall. Patient stated that she was in the tub was trying to get out and fell backwards. Patient is complaining of pain all over, hematoma to the head. Patient denies any LOC.

## 2011-09-12 NOTE — ED Notes (Addendum)
Pt reports that she had finished taking a shower and was standing with her walker and looking in the mirror when her aid walked away and she started to feel dizzy. Pt reports falling backwards and hitting occipital region of head on floor. Denies LOC. PERRL. Pt reports pain to hematoma and upper neck. Denies any dizziness at this time. Moves all extremities without difficulty.

## 2011-09-12 NOTE — ED Notes (Signed)
Pt advised that no PO fluids will be given till CT results, pt verbalized understanding.

## 2011-09-12 NOTE — ED Notes (Signed)
KED removed. Pt placed in gown. Pt and visitor at bedside updated on poc.

## 2011-09-12 NOTE — ED Provider Notes (Addendum)
History     CSN: [id number]  Arrival date & time 09/12/11  1007   First MD Initiated Contact with Patient 09/12/11 1013      Chief Complaint  Patient presents with  . Fall    (Consider location/radiation/quality/duration/timing/severity/associated sxs/prior treatment) Patient is a 78 y.o. female presenting with fall. The history is provided by the patient.  Fall Associated symptoms include headaches. Pertinent negatives include no numbness, no abdominal pain, no nausea and no vomiting.   patient states that she became dizzy while in the shower and fell. She has pain in her neck head and back. No loss of consciousness. She states she has histories of getting dizzy like this. No chest pain. No abdominal pain. No numbness or weakness.  Past Medical History  Diagnosis Date  . Hypertension   . Hyperlipidemia   . Polymyalgia rheumatica   . GERD (gastroesophageal reflux disease)   . Irritable bowel syndrome   . Diverticulitis   . Sciatica   . Vertigo   . Edema of leg   . Glaucoma   . Appendicitis   . Arthritis     Past Surgical History  Procedure Date  . Nasal sinus surgery 1966  . Cholecystectomy 1972  . Cataract extraction 1990  . Breast surgery     R breast lumpectomy for benign disease  . Breast lumpectomy 10/28/10    left breast     Family History  Problem Relation Age of Onset  . Stroke Father   . Hypertension Mother   . Stroke Mother   . Breast cancer Mother   . Heart attack Brother   . Cancer Brother   . Stroke Brother   . Heart attack Son   . Colon cancer Sister     History  Substance Use Topics  . Smoking status: Never Smoker   . Smokeless tobacco: Never Used  . Alcohol Use: No    OB History    Grav Para Term Preterm Abortions TAB SAB Ect Mult Living                  Review of Systems  Constitutional: Negative for activity change and appetite change.  HENT: Positive for neck pain. Negative for neck stiffness.   Eyes: Negative for pain.    Respiratory: Negative for chest tightness and shortness of breath.   Cardiovascular: Negative for chest pain and leg swelling.  Gastrointestinal: Negative for nausea, vomiting, abdominal pain and diarrhea.  Genitourinary: Negative for flank pain.  Musculoskeletal: Positive for back pain.       Neck pain  Skin: Negative for rash.  Neurological: Positive for dizziness and headaches. Negative for weakness and numbness.  Psychiatric/Behavioral: Negative for behavioral problems.    Allergies  Codeine  Home Medications   Current Outpatient Rx  Name Route Sig Dispense Refill  . AMLODIPINE BESYLATE 5 MG PO TABS Oral Take 5 mg by mouth daily.    . ASPIRIN 81 MG PO CHEW Oral Chew 81 mg by mouth every other day.     Marland Kitchen BIMATOPROST 0.01 % OP SOLN Both Eyes Place 1 drop into both eyes at bedtime.     Marland Kitchen BRIMONIDINE TARTRATE-TIMOLOL 0.2-0.5 % OP SOLN Both Eyes Place 1 drop into both eyes 2 (two) times daily.     Marland Kitchen VITAMIN D 1000 UNITS PO TABS Oral Take 1,000 Units by mouth daily.    . COD LIVER OIL PO Oral Take 1 capsule by mouth daily.    . DORZOLAMIDE HCL 2 %  OP SOLN Both Eyes Place 1 drop into both eyes 2 (two) times daily.      Marland Kitchen ESOMEPRAZOLE MAGNESIUM 20 MG PO CPDR Oral Take 20 mg by mouth daily before breakfast.      . ADULT MULTIVITAMIN W/MINERALS CH Oral Take 1 tablet by mouth daily.    . TRAMADOL HCL 50 MG PO TABS Oral Take 50 mg by mouth 2 (two) times daily as needed. For pain      BP 149/76  Pulse 54  Temp 98 F (36.7 C) (Oral)  Resp 18  Ht 5\' 4"  (1.626 m)  Wt 170 lb (77.111 kg)  BMI 29.18 kg/m2  SpO2 94%  Physical Exam  Nursing note and vitals reviewed. Constitutional: She is oriented to person, place, and time. She appears well-developed and well-nourished.  HENT:  Head: Normocephalic and atraumatic.  Eyes: EOM are normal. Pupils are equal, round, and reactive to light.  Neck:       Patient is immobilized in cervical collar. She is tender over proximal cervical spine.   No crepitance or deformity  Cardiovascular: Normal rate, regular rhythm and normal heart sounds.   No murmur heard. Pulmonary/Chest: Effort normal and breath sounds normal. No respiratory distress. She has no wheezes. She has no rales.  Abdominal: Soft. Bowel sounds are normal. She exhibits no distension. There is no tenderness. There is no rebound and no guarding.  Musculoskeletal:       Tenderness over lower thoracic spine. No crepitance or deformity. No ecchymosis.  Neurological: She is alert and oriented to person, place, and time. No cranial nerve deficit.  Skin: Skin is warm and dry.  Psychiatric: She has a normal mood and affect. Her speech is normal.    ED Course  Procedures (including critical care time)  Labs Reviewed  CBC WITH DIFFERENTIAL - Abnormal; Notable for the following:    WBC 3.7 (*)     RBC 3.76 (*)     Hemoglobin 11.3 (*)     HCT 35.5 (*)     All other components within normal limits  BASIC METABOLIC PANEL - Abnormal; Notable for the following:    BUN 27 (*)     Creatinine, Ser 1.17 (*)     GFR calc non Af Amer 40 (*)     GFR calc Af Amer 46 (*)     All other components within normal limits  URINALYSIS, ROUTINE W REFLEX MICROSCOPIC - Abnormal; Notable for the following:    Hgb urine dipstick SMALL (*)     Nitrite POSITIVE (*)     Leukocytes, UA SMALL (*)     All other components within normal limits  URINE MICROSCOPIC-ADD ON - Abnormal; Notable for the following:    Bacteria, UA MANY (*)     All other components within normal limits  TROPONIN I  LAB REPORT - SCANNED   Dg Chest 2 View  09/12/2011  *RADIOLOGY REPORT*  Clinical Data: History of fall with lower back pain.  Syncope.  CHEST - 2 VIEW  Comparison: Chest x-ray 02/12/2011.  Findings: Lung volumes are low.  Small left pleural effusion.  No acute consolidative airspace disease.  Pulmonary vasculature is normal.  Heart size is within normal limits. No pneumothorax.  The patient is rotated to the left  on today's exam, resulting in distortion of the mediastinal contours and reduced diagnostic sensitivity and specificity for mediastinal pathology. Atherosclerosis in the thoracic aorta. No definite acute displaced rib fractures are noted.  Surgical clips in  the left breast.  IMPRESSION: 1.  Low lung volumes with small left-sided pleural effusion. 2.  Atherosclerosis.  Original Report Authenticated By: Florencia Reasons, M.D.   Dg Lumbar Spine Complete  09/12/2011  *RADIOLOGY REPORT*  Clinical Data: 78 year old female status post fall, syncope, confusion, pain.  LUMBAR SPINE - COMPLETE 4+ VIEW  Comparison: None.  Findings: Normal lumbar segmentation, hypoplastic ribs at T12.  No lumbar compression deformity identified.  Intermittent lumbar disc space loss, maximal L4-L5 and L5-S1.  Associated endplate spurring, maximal L4.  No pars fracture.  Moderate lower lumbar facet hypertrophy.  Sacral ala and SI joints within normal limits. Calcified atherosclerosis of the abdominal aorta.  IMPRESSION: No acute fracture or listhesis identified in the lumbar spine.  Original Report Authenticated By: Harley Hallmark, M.D.   Ct Head Wo Contrast  09/12/2011  *RADIOLOGY REPORT*  Clinical Data: Dizzy.  The patient fell backwards hitting occiput region of the head.  No loss of consciousness.  CT HEAD WITHOUT CONTRAST  Technique:  Contiguous axial images were obtained from the base of the skull through the vertex without contrast.  Comparison: None.  Findings: The ventricles are normal in size, for this patient's age, and normal in configuration.  There are no parenchymal masses or mass effect.  There are no areas of abnormal parenchymal attenuation to suggest a recent infarct.  There are no extra-axial masses or abnormal fluid collections.  There is no intracranial hemorrhage.  No skull fracture.  Chronic changes from the right maxillary sinus suggest previous surgery.  This is stable.  Mucous retention cyst in the left  maxillary sinus.  Clear mastoid air cells.  IMPRESSION: No acute intracranial abnormalities.  Original Report Authenticated By: Domenic Moras, M.D.   Ct Cervical Spine Wo Contrast  09/12/2011  *RADIOLOGY REPORT*  Clinical Data: Larey Seat with neck pain.  CT CERVICAL SPINE WITHOUT CONTRAST  Technique:  Multidetector CT imaging of the cervical spine was performed. Multiplanar CT image reconstructions were also generated.  Comparison: None.  Findings: No fracture or spondylolisthesis.  There is mild loss of disc height with small endplate osteophytes at C4-C5 through C6-C7. There is no significant stenosis.  Mild facet degenerative changes are noted most prominently on the right at C4-C5.  The bones are demineralized.  The surrounding soft tissues demonstrate vascular calcifications and mild thyroid enlargement and heterogeneity which suggests multiple nodules.  Soft tissues otherwise unremarkable.  The lung apices are clear.  IMPRESSION: No fracture or acute finding.  Original Report Authenticated By: Domenic Moras, M.D.     1. Fall       MDM  patietn with fall in bathtub. Negative xrays and CT. History of vertigo with previous falls. Will d/c.     Date: 10/09/2011  Rate: 59  Rhythm: sinus bradycardia  QRS Axis: normal  Intervals: normal  ST/T Wave abnormalities: normal  Conduction Disutrbances:none  Narrative Interpretation:   Old EKG Reviewed: unchanged       Wynter Grave R. Rubin Payor, MD 09/13/11 [id number]  Juliet Rude. Rubin Payor, MD 10/09/11 2057

## 2011-09-18 ENCOUNTER — Ambulatory Visit
Admission: RE | Admit: 2011-09-18 | Discharge: 2011-09-18 | Disposition: A | Discharge status: Home / Self Care | Payer: Medicare Other | Source: Ambulatory Visit | Attending: Internal Medicine | Admitting: Internal Medicine

## 2011-09-18 DIAGNOSIS — Z853 Personal history of malignant neoplasm of breast: Secondary | ICD-10-CM | POA: Diagnosis not present

## 2011-09-18 DIAGNOSIS — Z9889 Other specified postprocedural states: Secondary | ICD-10-CM

## 2011-09-21 DIAGNOSIS — R42 Dizziness and giddiness: Secondary | ICD-10-CM | POA: Diagnosis not present

## 2011-09-21 DIAGNOSIS — I10 Essential (primary) hypertension: Secondary | ICD-10-CM | POA: Diagnosis not present

## 2011-09-21 DIAGNOSIS — Z9181 History of falling: Secondary | ICD-10-CM | POA: Diagnosis not present

## 2011-10-19 ENCOUNTER — Encounter (INDEPENDENT_AMBULATORY_CARE_PROVIDER_SITE_OTHER): Payer: Self-pay | Admitting: General Surgery

## 2011-10-26 DIAGNOSIS — Z23 Encounter for immunization: Secondary | ICD-10-CM | POA: Diagnosis not present

## 2011-11-23 DIAGNOSIS — D649 Anemia, unspecified: Secondary | ICD-10-CM | POA: Diagnosis not present

## 2011-11-23 DIAGNOSIS — E538 Deficiency of other specified B group vitamins: Secondary | ICD-10-CM | POA: Diagnosis not present

## 2011-11-23 DIAGNOSIS — R5381 Other malaise: Secondary | ICD-10-CM | POA: Diagnosis not present

## 2011-11-23 DIAGNOSIS — I10 Essential (primary) hypertension: Secondary | ICD-10-CM | POA: Diagnosis not present

## 2011-11-23 DIAGNOSIS — E559 Vitamin D deficiency, unspecified: Secondary | ICD-10-CM | POA: Diagnosis not present

## 2011-11-23 DIAGNOSIS — R413 Other amnesia: Secondary | ICD-10-CM | POA: Diagnosis not present

## 2011-11-23 DIAGNOSIS — R269 Unspecified abnormalities of gait and mobility: Secondary | ICD-10-CM | POA: Diagnosis not present

## 2011-11-23 DIAGNOSIS — R5383 Other fatigue: Secondary | ICD-10-CM | POA: Diagnosis not present

## 2011-11-27 ENCOUNTER — Encounter (INDEPENDENT_AMBULATORY_CARE_PROVIDER_SITE_OTHER): Payer: Self-pay | Admitting: General Surgery

## 2011-11-27 ENCOUNTER — Ambulatory Visit (INDEPENDENT_AMBULATORY_CARE_PROVIDER_SITE_OTHER): Payer: Medicare Other | Admitting: General Surgery

## 2011-11-27 VITALS — BP 132/74 | HR 60 | Temp 98.0°F | Resp 18 | Ht 64.0 in | Wt 178.4 lb

## 2011-11-27 DIAGNOSIS — D0590 Unspecified type of carcinoma in situ of unspecified breast: Secondary | ICD-10-CM | POA: Diagnosis not present

## 2011-11-27 DIAGNOSIS — D0510 Intraductal carcinoma in situ of unspecified breast: Secondary | ICD-10-CM

## 2011-11-27 NOTE — Patient Instructions (Addendum)
Follow up with me in 1 year.    Stay on track with mammograms.  See Dr. Darnelle Catalan as instructed.

## 2011-12-05 DIAGNOSIS — H4011X0 Primary open-angle glaucoma, stage unspecified: Secondary | ICD-10-CM | POA: Diagnosis not present

## 2011-12-05 DIAGNOSIS — H409 Unspecified glaucoma: Secondary | ICD-10-CM | POA: Diagnosis not present

## 2011-12-05 NOTE — Progress Notes (Signed)
HISTORY: Pt has no change in clinical history or breast issues.  She has occasional breast pain on the left.  She has not had new health problems.  She had a mammogram demonstrating scarring, but no concerning lesions.    PERTINENT REVIEW OF SYSTEMS: Otherwise negative.    EXAM: Head: Normocephalic and atraumatic.  Eyes:  Conjunctivae injected, and there is swelling around both eyes. Pupils are equal, round, and reactive to light. No scleral icterus.  Neck:  Normal range of motion. Neck supple. No tracheal deviation present. No thyromegaly present.  Resp: No respiratory distress, normal effort. Breast:  There are no palpable masses in either breast.  There is some thickening of the scar on the left breast that softens up when examined supine.  There is no skin dimpling or nipple retraction.   Abd:  Abdomen is soft, non distended and non tender. No masses are palpable.  There is no rebound and no guarding.  Neurological: Alert and oriented to person, place, and time. Coordination impaired. Skin: Skin is warm and dry. No rash noted. No diaphoretic. No erythema. No pallor.  Psychiatric: Normal mood and affect. Normal behavior. Judgment and thought content normal.      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  Stay on track for mammograms.  Follow up in 1 year.         Maudry Diego, MD Surgical Oncology, General & Endocrine Surgery [facility]  REED, TIFFANY, DO Reed, Tiffany L, DO

## 2011-12-05 NOTE — Assessment & Plan Note (Addendum)
Pt without clinical evidence of disease.  Stay on track for mammograms.  Follow up in 1 year.

## 2011-12-06 ENCOUNTER — Telehealth: Payer: Self-pay | Admitting: Oncology

## 2011-12-06 NOTE — Telephone Encounter (Signed)
This pt called to get an appt with dr Darnelle Catalan . The pt was supposed to have came back in aug 2013. R/s the appts to the next available dates.

## 2012-01-04 ENCOUNTER — Telehealth: Payer: Self-pay | Admitting: *Deleted

## 2012-01-04 ENCOUNTER — Ambulatory Visit (HOSPITAL_BASED_OUTPATIENT_CLINIC_OR_DEPARTMENT_OTHER): Payer: Medicare Other | Admitting: Oncology

## 2012-01-04 ENCOUNTER — Other Ambulatory Visit (HOSPITAL_BASED_OUTPATIENT_CLINIC_OR_DEPARTMENT_OTHER): Payer: Medicare Other | Admitting: Lab

## 2012-01-04 VITALS — BP 130/79 | HR 56 | Temp 98.2°F | Resp 20 | Ht 64.0 in | Wt 178.9 lb

## 2012-01-04 DIAGNOSIS — D0590 Unspecified type of carcinoma in situ of unspecified breast: Secondary | ICD-10-CM

## 2012-01-04 DIAGNOSIS — Z170 Estrogen receptor positive status [ER+]: Secondary | ICD-10-CM | POA: Diagnosis not present

## 2012-01-04 DIAGNOSIS — M353 Polymyalgia rheumatica: Secondary | ICD-10-CM

## 2012-01-04 DIAGNOSIS — I10 Essential (primary) hypertension: Secondary | ICD-10-CM

## 2012-01-04 DIAGNOSIS — D0510 Intraductal carcinoma in situ of unspecified breast: Secondary | ICD-10-CM

## 2012-01-04 LAB — CBC WITH DIFFERENTIAL/PLATELET
Basophils Absolute: 0 10*3/uL (ref 0.0–0.1)
EOS%: 2.5 % (ref 0.0–7.0)
Eosinophils Absolute: 0.1 10*3/uL (ref 0.0–0.5)
HCT: 32.9 % — ABNORMAL LOW (ref 34.8–46.6)
HGB: 10.3 g/dL — ABNORMAL LOW (ref 11.6–15.9)
MCH: 29.6 pg (ref 25.1–34.0)
MCV: 94.5 fL (ref 79.5–101.0)
MONO%: 11.1 % (ref 0.0–14.0)
NEUT#: 1.7 10*3/uL (ref 1.5–6.5)
NEUT%: 52 % (ref 38.4–76.8)

## 2012-01-04 LAB — COMPREHENSIVE METABOLIC PANEL (CC13)
Albumin: 3.3 g/dL — ABNORMAL LOW (ref 3.5–5.0)
Alkaline Phosphatase: 67 U/L (ref 40–150)
BUN: 29 mg/dL — ABNORMAL HIGH (ref 7.0–26.0)
CO2: 25 mEq/L (ref 22–29)
Glucose: 88 mg/dl (ref 70–99)
Potassium: 4.4 mEq/L (ref 3.5–5.1)
Sodium: 141 mEq/L (ref 136–145)
Total Bilirubin: 0.63 mg/dL (ref 0.20–1.20)
Total Protein: 6.5 g/dL (ref 6.4–8.3)

## 2012-01-04 NOTE — Progress Notes (Signed)
ID: Michelle [name]   DOB: December 07, 1920  MR#: [id number]  WUJ#:[id number]  PCP: Michelle Spikes, DO GYN:  SU: faera byerly OTHER MD: Michelle [name]   HISTORY OF PRESENT ILLNESS: Michelle [name] had routine screening mammography September 16, 2010.  There were new microcalcifications noted in the left breast and she was brought back for additional views on September 27, 2010.  Bilateral diagnostic mammography on that day showed a cluster of calcifications in the lower inner quadrant of the left breast measuring up to 2.9 cm.  There was a faint parenchymal density associated with these calcifications.  Accordingly, the patient proceeded to stereotactically guided vacuum-assisted biopsy of the left breast October 04, 2010.  The pathology from that procedure ([id number]) showed a ductal carcinoma in situ.  This appeared to be intermediate to high-grade and it was strongly estrogen receptor positive at 100%, weakly progesterone receptor positive at 6%. Her subsequent history is as detailed below  INTERVAL HISTORY: Michelle [name] returns today with her caregiver Michelle [name] for followup of the patient's breast cancer. The interval history is unremarkable. She has her caregiver there during the day and they took her at night. Her daughter lives nearby. She gets out of the house about 3 or 4 times a week, doing some shopping, going to church, etc.  REVIEW OF SYSTEMS: She thinks she may need glasses to read. She has problems with heartburn, which is helped by her Nexium. She has a bowel movement about every other day. They are not hard. She does have urinary leakage issues and problems with urinary tract infections at times. She has mild chronic back and joint pain. Otherwise a detailed review of systems today was entirely noncontributory  PAST MEDICAL HISTORY: Past Medical History  Diagnosis Date  . Hypertension   . Hyperlipidemia   . Polymyalgia rheumatica   . GERD (gastroesophageal reflux disease)   . Irritable bowel  syndrome   . Diverticulitis   . Sciatica   . Vertigo   . Edema of leg   . Glaucoma(365)   . Appendicitis   . Arthritis   The patient has an extensive medical history including hypertension, hyperlipidemia, polymyalgia rheumatica, GERD, irritable bowel syndrome, diverticulosis, sciatica, hyperactive bladder, vertigo, phlebitis, glaucoma and remote appendicitis.  She has undergone sinus surgery, cataract surgery and is status post cholecystectomy.  PAST SURGICAL HISTORY: Past Surgical History  Procedure Date  . Nasal sinus surgery 1966  . Cholecystectomy 1972  . Cataract extraction 1990  . Breast surgery     R breast lumpectomy for benign disease  . Breast lumpectomy 10/28/10    left breast     FAMILY HISTORY Family History  Problem Relation Age of Onset  . Stroke Father   . Hypertension Mother   . Stroke Mother   . Breast cancer Mother   . Heart attack Brother   . Cancer Brother   . Stroke Brother   . Heart attack Son   . Colon cancer Sister   The patient's father died from a stroke at the age of 75.  The patient's mother died at the age of 18 from breast cancer.  The patient had a brother who died at age 75 from a stroke and a sister who died at age 45 from colon cancer.  Another brother had Hodgkin's disease.  There were altogether 5 brothers and 5 sisters.  GYNECOLOGIC HISTORY: She had menarche age 80.  She is GX, P1, age at first live birth of 59.  She never took hormone replacement.  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   Her daughter, Michelle [name], present today, is Insurance risk surveyor at [employer] for [employer]  The patient's husband, Michelle [name], died many years ago.  Michelle [name] lives in a retirement home and, aside from breakfast, she does not cook any meals, no longer drives and mostly stays around the retirement home.  She does do some rehab exercises there. She at tends [employer] the [employer]   ADVANCED DIRECTIVES:  HEALTH MAINTENANCE: History  Substance Use Topics  . Smoking status: Never Smoker   . Smokeless tobacco: Never Used  . Alcohol Use: No     Colonoscopy:  PAP:  Bone density:  Lipid panel:  Allergies  Allergen Reactions  . Codeine     States she blacks out    Current Outpatient Prescriptions  Medication Sig Dispense Refill  . amLODipine (NORVASC) 5 MG tablet Take 5 mg by mouth daily.      Marland Kitchen aspirin 81 MG chewable tablet Chew 81 mg by mouth every other day.       . bimatoprost (LUMIGAN) 0.01 % SOLN Place 1 drop into both eyes at bedtime.       . brimonidine-timolol (COMBIGAN) 0.2-0.5 % ophthalmic solution Place 1 drop into both eyes 2 (two) times daily.       . cholecalciferol (VITAMIN D) 1000 UNITS tablet Take 1,000 Units by mouth daily.      . COD LIVER OIL PO Take 1 capsule by mouth daily.      . dorzolamide (TRUSOPT) 2 % ophthalmic solution Place 1 drop into both eyes 2 (two) times daily.        Marland Kitchen esomeprazole (NEXIUM) 20 MG capsule Take 20 mg by mouth daily before breakfast.        . Multiple Vitamin (MULTIVITAMIN WITH MINERALS) TABS Take 1 tablet by mouth daily.      . traMADol (ULTRAM) 50 MG tablet Take 50 mg by mouth 2 (two) times daily as needed. For pain        OBJECTIVE: Elderly African American woman examined in a wheelchair Filed Vitals:   01/04/12 1157  BP: 130/79  Pulse: 56  Temp: 98.2 F (36.8 C)  Resp: 20     Body mass index is 30.71 kg/(m^2).    ECOG FS: 2  Sclerae unicteric Oropharynx clear No cervical or supraclavicular adenopathy Lungs no rales or rhonchi Heart regular rate and rhythm Abd benign MSK no focal spinal tenderness Neuro: nonfocal, alert and oriented x3, positive affect Breasts: The right breast is unremarkable. The left breast is status post lumpectomy. There is no evidence of local recurrence. The left axilla is benign   LAB RESULTS: Lab Results  Component Value Date   WBC 3.2* 01/04/2012    NEUTROABS 1.7 01/04/2012   HGB 10.3* 01/04/2012   HCT 32.9* 01/04/2012   MCV 94.5 01/04/2012   PLT 160 01/04/2012      Chemistry      Component Value Date/Time   NA 142 09/12/2011 1057   K 4.4 09/12/2011 1057   CL 107 09/12/2011 1057   CO2 28 09/12/2011 1057   BUN 27* 09/12/2011 1057   CREATININE 1.17* 09/12/2011 1057      Component Value Date/Time   CALCIUM 9.7 09/12/2011 1057   ALKPHOS 56 02/13/2011 0655   AST 24 02/13/2011 0655   ALT 10 02/13/2011 0655   BILITOT 0.5 02/13/2011 4540  No results found for this basename: LABCA2    No components found with this basename: [id number]    No results found for this basename: INR:1;PROTIME:1 in the last 168 hours  Urinalysis    Component Value Date/Time   COLORURINE YELLOW 09/12/2011 1210   APPEARANCEUR CLEAR 09/12/2011 1210   LABSPEC 1.018 09/12/2011 1210   PHURINE 6.5 09/12/2011 1210   GLUCOSEU NEGATIVE 09/12/2011 1210   HGBUR SMALL* 09/12/2011 1210   BILIRUBINUR NEGATIVE 09/12/2011 1210   KETONESUR NEGATIVE 09/12/2011 1210   PROTEINUR NEGATIVE 09/12/2011 1210   UROBILINOGEN 0.2 09/12/2011 1210   NITRITE POSITIVE* 09/12/2011 1210   LEUKOCYTESUR SMALL* 09/12/2011 1210    STUDIES: No results found.  ASSESSMENT: 78 y.o. old [place] woman status post left lumpectomy October 28, 2010 for a 1.8 cm ductal carcinoma in situ, high-grade, estrogen and progesterone receptor positive at 100% and 6%, with negative margins. She did not receive radiation, but took letrozole for one year, stopping October 2013  PLAN:  She has really felt no different since she stopped the letrozole. Nevertheless I am not sure that it is of any benefit for her to take anti-estrogens, given that she will be turning 89 in a few months. We may be weakening her bones at a time when a fall could be fatal for her. For that reason I am comfortable stopping the letrozole and that is what we did today.  Otherwise she will see [place] again in October of 2014. She will be due  for repeat mammography August of 2014. She knows to call for any problems that may develop before the next visit.   Ezekiah Massie C    01/04/2012

## 2012-01-04 NOTE — Telephone Encounter (Signed)
Gave patient appointment for 10-2012

## 2012-01-04 NOTE — Telephone Encounter (Signed)
Gave patient appointment for 11-07-2012 at 12:00pm

## 2012-01-05 NOTE — Addendum Note (Signed)
Addended by: Lorenza Evangelist A on: 01/05/2012 02:25 PM   Modules accepted: Orders

## 2012-01-11 ENCOUNTER — Ambulatory Visit
Admission: RE | Admit: 2012-01-11 | Discharge: 2012-01-11 | Disposition: A | Discharge status: Home / Self Care | Payer: Medicare Other | Source: Ambulatory Visit | Attending: Internal Medicine | Admitting: Internal Medicine

## 2012-01-11 ENCOUNTER — Other Ambulatory Visit: Payer: Self-pay | Admitting: Internal Medicine

## 2012-01-11 DIAGNOSIS — J90 Pleural effusion, not elsewhere classified: Secondary | ICD-10-CM

## 2012-01-11 DIAGNOSIS — N183 Chronic kidney disease, stage 3 unspecified: Secondary | ICD-10-CM | POA: Diagnosis not present

## 2012-01-11 DIAGNOSIS — D649 Anemia, unspecified: Secondary | ICD-10-CM | POA: Diagnosis not present

## 2012-01-11 DIAGNOSIS — R079 Chest pain, unspecified: Secondary | ICD-10-CM | POA: Diagnosis not present

## 2012-01-11 DIAGNOSIS — R0781 Pleurodynia: Secondary | ICD-10-CM

## 2012-01-11 DIAGNOSIS — N390 Urinary tract infection, site not specified: Secondary | ICD-10-CM | POA: Diagnosis not present

## 2012-01-11 DIAGNOSIS — J984 Other disorders of lung: Secondary | ICD-10-CM | POA: Diagnosis not present

## 2012-01-11 DIAGNOSIS — I10 Essential (primary) hypertension: Secondary | ICD-10-CM | POA: Diagnosis not present

## 2012-01-16 DIAGNOSIS — M216X9 Other acquired deformities of unspecified foot: Secondary | ICD-10-CM | POA: Diagnosis not present

## 2012-01-17 DIAGNOSIS — R269 Unspecified abnormalities of gait and mobility: Secondary | ICD-10-CM | POA: Diagnosis not present

## 2012-01-17 DIAGNOSIS — M353 Polymyalgia rheumatica: Secondary | ICD-10-CM | POA: Diagnosis not present

## 2012-01-17 DIAGNOSIS — IMO0001 Reserved for inherently not codable concepts without codable children: Secondary | ICD-10-CM | POA: Diagnosis not present

## 2012-01-17 DIAGNOSIS — I129 Hypertensive chronic kidney disease with stage 1 through stage 4 chronic kidney disease, or unspecified chronic kidney disease: Secondary | ICD-10-CM | POA: Diagnosis not present

## 2012-01-17 DIAGNOSIS — M6281 Muscle weakness (generalized): Secondary | ICD-10-CM | POA: Diagnosis not present

## 2012-01-19 DIAGNOSIS — R269 Unspecified abnormalities of gait and mobility: Secondary | ICD-10-CM | POA: Diagnosis not present

## 2012-01-19 DIAGNOSIS — M353 Polymyalgia rheumatica: Secondary | ICD-10-CM | POA: Diagnosis not present

## 2012-01-19 DIAGNOSIS — I129 Hypertensive chronic kidney disease with stage 1 through stage 4 chronic kidney disease, or unspecified chronic kidney disease: Secondary | ICD-10-CM | POA: Diagnosis not present

## 2012-01-19 DIAGNOSIS — IMO0001 Reserved for inherently not codable concepts without codable children: Secondary | ICD-10-CM | POA: Diagnosis not present

## 2012-01-19 DIAGNOSIS — M6281 Muscle weakness (generalized): Secondary | ICD-10-CM | POA: Diagnosis not present

## 2012-01-22 DIAGNOSIS — R269 Unspecified abnormalities of gait and mobility: Secondary | ICD-10-CM | POA: Diagnosis not present

## 2012-01-22 DIAGNOSIS — M6281 Muscle weakness (generalized): Secondary | ICD-10-CM | POA: Diagnosis not present

## 2012-01-22 DIAGNOSIS — I129 Hypertensive chronic kidney disease with stage 1 through stage 4 chronic kidney disease, or unspecified chronic kidney disease: Secondary | ICD-10-CM | POA: Diagnosis not present

## 2012-01-22 DIAGNOSIS — M353 Polymyalgia rheumatica: Secondary | ICD-10-CM | POA: Diagnosis not present

## 2012-01-22 DIAGNOSIS — IMO0001 Reserved for inherently not codable concepts without codable children: Secondary | ICD-10-CM | POA: Diagnosis not present

## 2012-01-26 DIAGNOSIS — R269 Unspecified abnormalities of gait and mobility: Secondary | ICD-10-CM | POA: Diagnosis not present

## 2012-01-26 DIAGNOSIS — IMO0001 Reserved for inherently not codable concepts without codable children: Secondary | ICD-10-CM | POA: Diagnosis not present

## 2012-01-26 DIAGNOSIS — M6281 Muscle weakness (generalized): Secondary | ICD-10-CM | POA: Diagnosis not present

## 2012-01-26 DIAGNOSIS — I129 Hypertensive chronic kidney disease with stage 1 through stage 4 chronic kidney disease, or unspecified chronic kidney disease: Secondary | ICD-10-CM | POA: Diagnosis not present

## 2012-01-26 DIAGNOSIS — M353 Polymyalgia rheumatica: Secondary | ICD-10-CM | POA: Diagnosis not present

## 2012-01-29 DIAGNOSIS — IMO0001 Reserved for inherently not codable concepts without codable children: Secondary | ICD-10-CM | POA: Diagnosis not present

## 2012-01-29 DIAGNOSIS — M353 Polymyalgia rheumatica: Secondary | ICD-10-CM | POA: Diagnosis not present

## 2012-01-29 DIAGNOSIS — I129 Hypertensive chronic kidney disease with stage 1 through stage 4 chronic kidney disease, or unspecified chronic kidney disease: Secondary | ICD-10-CM | POA: Diagnosis not present

## 2012-01-29 DIAGNOSIS — R269 Unspecified abnormalities of gait and mobility: Secondary | ICD-10-CM | POA: Diagnosis not present

## 2012-01-29 DIAGNOSIS — M6281 Muscle weakness (generalized): Secondary | ICD-10-CM | POA: Diagnosis not present

## 2012-02-01 DIAGNOSIS — R269 Unspecified abnormalities of gait and mobility: Secondary | ICD-10-CM | POA: Diagnosis not present

## 2012-02-01 DIAGNOSIS — I129 Hypertensive chronic kidney disease with stage 1 through stage 4 chronic kidney disease, or unspecified chronic kidney disease: Secondary | ICD-10-CM | POA: Diagnosis not present

## 2012-02-01 DIAGNOSIS — M353 Polymyalgia rheumatica: Secondary | ICD-10-CM | POA: Diagnosis not present

## 2012-02-01 DIAGNOSIS — IMO0001 Reserved for inherently not codable concepts without codable children: Secondary | ICD-10-CM | POA: Diagnosis not present

## 2012-02-01 DIAGNOSIS — M6281 Muscle weakness (generalized): Secondary | ICD-10-CM | POA: Diagnosis not present

## 2012-02-06 DIAGNOSIS — IMO0001 Reserved for inherently not codable concepts without codable children: Secondary | ICD-10-CM | POA: Diagnosis not present

## 2012-02-06 DIAGNOSIS — M6281 Muscle weakness (generalized): Secondary | ICD-10-CM | POA: Diagnosis not present

## 2012-02-06 DIAGNOSIS — R269 Unspecified abnormalities of gait and mobility: Secondary | ICD-10-CM | POA: Diagnosis not present

## 2012-02-06 DIAGNOSIS — M353 Polymyalgia rheumatica: Secondary | ICD-10-CM | POA: Diagnosis not present

## 2012-02-06 DIAGNOSIS — I129 Hypertensive chronic kidney disease with stage 1 through stage 4 chronic kidney disease, or unspecified chronic kidney disease: Secondary | ICD-10-CM | POA: Diagnosis not present

## 2012-02-08 DIAGNOSIS — R269 Unspecified abnormalities of gait and mobility: Secondary | ICD-10-CM | POA: Diagnosis not present

## 2012-02-08 DIAGNOSIS — M6281 Muscle weakness (generalized): Secondary | ICD-10-CM | POA: Diagnosis not present

## 2012-02-08 DIAGNOSIS — I129 Hypertensive chronic kidney disease with stage 1 through stage 4 chronic kidney disease, or unspecified chronic kidney disease: Secondary | ICD-10-CM | POA: Diagnosis not present

## 2012-02-08 DIAGNOSIS — M353 Polymyalgia rheumatica: Secondary | ICD-10-CM | POA: Diagnosis not present

## 2012-02-08 DIAGNOSIS — IMO0001 Reserved for inherently not codable concepts without codable children: Secondary | ICD-10-CM | POA: Diagnosis not present

## 2012-04-04 DIAGNOSIS — N390 Urinary tract infection, site not specified: Secondary | ICD-10-CM | POA: Diagnosis not present

## 2012-04-04 DIAGNOSIS — E785 Hyperlipidemia, unspecified: Secondary | ICD-10-CM | POA: Diagnosis not present

## 2012-04-08 DIAGNOSIS — F0280 Dementia in other diseases classified elsewhere without behavioral disturbance: Secondary | ICD-10-CM | POA: Diagnosis not present

## 2012-04-08 DIAGNOSIS — D649 Anemia, unspecified: Secondary | ICD-10-CM | POA: Diagnosis not present

## 2012-04-08 DIAGNOSIS — E785 Hyperlipidemia, unspecified: Secondary | ICD-10-CM | POA: Diagnosis not present

## 2012-04-08 DIAGNOSIS — I10 Essential (primary) hypertension: Secondary | ICD-10-CM | POA: Diagnosis not present

## 2012-04-08 DIAGNOSIS — E559 Vitamin D deficiency, unspecified: Secondary | ICD-10-CM | POA: Diagnosis not present

## 2012-04-08 DIAGNOSIS — N3941 Urge incontinence: Secondary | ICD-10-CM | POA: Diagnosis not present

## 2012-05-13 ENCOUNTER — Other Ambulatory Visit: Payer: Self-pay | Admitting: *Deleted

## 2012-05-13 MED ORDER — TRAMADOL HCL 50 MG PO TABS: ORAL_TABLET | ORAL | Status: DC

## 2012-05-23 ENCOUNTER — Encounter: Payer: Self-pay | Admitting: Nurse Practitioner

## 2012-05-23 ENCOUNTER — Ambulatory Visit (INDEPENDENT_AMBULATORY_CARE_PROVIDER_SITE_OTHER): Payer: Medicare Other | Admitting: Nurse Practitioner

## 2012-05-23 ENCOUNTER — Ambulatory Visit: Payer: Self-pay | Admitting: Nurse Practitioner

## 2012-05-23 ENCOUNTER — Encounter: Payer: Self-pay | Admitting: Geriatric Medicine

## 2012-05-23 VITALS — BP 110/80 | HR 58 | Temp 97.6°F | Resp 16 | Ht 62.0 in | Wt 175.0 lb

## 2012-05-23 DIAGNOSIS — R42 Dizziness and giddiness: Secondary | ICD-10-CM | POA: Diagnosis not present

## 2012-05-23 DIAGNOSIS — H6120 Impacted cerumen, unspecified ear: Secondary | ICD-10-CM | POA: Diagnosis not present

## 2012-05-23 DIAGNOSIS — W19XXXA Unspecified fall, initial encounter: Secondary | ICD-10-CM | POA: Diagnosis not present

## 2012-05-23 DIAGNOSIS — H6121 Impacted cerumen, right ear: Secondary | ICD-10-CM

## 2012-05-23 DIAGNOSIS — R413 Other amnesia: Secondary | ICD-10-CM | POA: Insufficient documentation

## 2012-05-23 DIAGNOSIS — R262 Difficulty in walking, not elsewhere classified: Secondary | ICD-10-CM

## 2012-05-23 MED ORDER — DONEPEZIL HCL 5 MG PO TABS: 5.0000 mg | ORAL_TABLET | Freq: Every day | ORAL | Status: DC

## 2012-05-23 NOTE — Assessment & Plan Note (Signed)
Score of 20/30 on MMSE failed clock drawing. Will start aricept 5mg  qhs for now and if tolerating this dose can increase to 10mg  when she has her appt with Dr Renato Gails

## 2012-05-23 NOTE — Progress Notes (Signed)
Patient ID: Michelle [name], female   DOB: Jan 08, 1921, 79 y.o.   MRN: [id number]   Allergies  Allergen Reactions  . Codeine     States she blacks out    Chief Complaint  Patient presents with  . Acute Visit    Patient has fallen 4 times in the last month.    HPI: Patient is a 79 y.o. female seen in the office today for an acute visit. Reports she has had 4 falls in the past 1 month.  Reports she has dizziness and this happens before she falls. Reports she passes out for an instant and then realizes she's on the floor and she has fallen. Denies chest pains or palpitation. after being question is she actually unsure if she has actually passed out or not. After a fall she has a life alert and and is able to call EMS to help her. Happens all times of the day- when she is already in motion when she is getting up.  Last fall was 1 week ago- 2 bruises on back and is sore from this.   Review of Systems:  Review of Systems  Constitutional: Negative for fever and chills.  HENT: Negative for congestion, sore throat and tinnitus.   Eyes:       Glaucoma   Respiratory: Negative for cough, sputum production, shortness of breath and wheezing.   Cardiovascular: Negative for chest pain, palpitations and leg swelling.  Gastrointestinal: Negative for abdominal pain, diarrhea and constipation.  Genitourinary: Positive for frequency. Negative for dysuria and urgency.  Musculoskeletal: Positive for myalgias and back pain.  Skin: Negative for itching and rash.       bruising on back due to recent fall  Neurological: Positive for dizziness and weakness. Negative for headaches.  Psychiatric/Behavioral: Negative for depression. The patient is not nervous/anxious and does not have insomnia.      Past Medical History  Diagnosis Date  . Hypertension   . Hyperlipidemia   . Polymyalgia rheumatica   . GERD (gastroesophageal reflux disease)   . Irritable bowel syndrome   . Diverticulitis   . Sciatica   .  Vertigo   . Edema of leg   . Glaucoma(365)   . Appendicitis   . Arthritis   . Acute upper respiratory infections of unspecified site   . Urge incontinence   . Other specified symptom associated with female genital organs   . Regional enteritis of unspecified site   . Sebaceous cyst   . Depression   . Other malaise and fatigue   . Abnormal weight gain   . Leukocytopenia, unspecified   . Dementia in conditions classified elsewhere without behavioral disturbance(294.10)   . Anemia, unspecified   . Chronic kidney disease, stage III (moderate)   . Unspecified arthropathy, shoulder region   . Other B-complex deficiencies   . Unspecified vitamin D deficiency   . Memory loss   . Abnormality of gait   . Personal history of fall   . Malignant neoplasm of breast (female), unspecified site   . Unspecified glaucoma(365.9)   . Diverticulosis of colon (without mention of hemorrhage)   . Irritable bowel syndrome   . Sciatica   . Dizziness and giddiness    Past Surgical History  Procedure Laterality Date  . Nasal sinus surgery  1966  . Cholecystectomy  1972  . Cataract extraction  1990  . Breast surgery      R breast lumpectomy for benign disease  . Breast lumpectomy  10/28/10  left breast    Social History:   reports that she has never smoked. She has never used smokeless tobacco. She reports that she does not drink alcohol or use illicit drugs.  Family History  Problem Relation Age of Onset  . Stroke Father   . Hypertension Mother   . Stroke Mother   . Breast cancer Mother   . Heart attack Brother   . Cancer Brother   . Stroke Brother   . Heart attack Son   . Colon cancer Sister     Medications: Patient's Medications  New Prescriptions   No medications on file  Previous Medications   AMLODIPINE (NORVASC) 5 MG TABLET    Take 5 mg by mouth daily.   ASPIRIN 81 MG CHEWABLE TABLET    Chew 81 mg by mouth every other day.    BIMATOPROST (LUMIGAN) 0.01 % SOLN    Place 1 drop  into both eyes at bedtime.    BRIMONIDINE-TIMOLOL (COMBIGAN) 0.2-0.5 % OPHTHALMIC SOLUTION    Place 1 drop into both eyes 2 (two) times daily.    CALCIUM CARBONATE 200 MG CAPSULE    Take 200 mg by mouth daily.    DORZOLAMIDE (TRUSOPT) 2 % OPHTHALMIC SOLUTION    Place 1 drop into both eyes 2 (two) times daily.     ESOMEPRAZOLE (NEXIUM) 20 MG CAPSULE    Take 20 mg by mouth daily before breakfast.     HYDROCODONE-ACETAMINOPHEN (NORCO/VICODIN) 5-325 MG PER TABLET    Take 1 tablet by mouth every 6 (six) hours as needed.   LETROZOLE (FEMARA) 2.5 MG TABLET    Take 2.5 mg by mouth daily.   MULTIPLE VITAMIN (MULTIVITAMIN WITH MINERALS) TABS    Take 1 tablet by mouth daily.   SIMVASTATIN (ZOCOR) 10 MG TABLET       TOLTERODINE (DETROL LA) 4 MG 24 HR CAPSULE    Take 4 mg by mouth daily.   TRAMADOL (ULTRAM) 50 MG TABLET    Take one tablet three times a day as needed for pain  Modified Medications   No medications on file  Discontinued Medications   CITALOPRAM (CELEXA) 10 MG TABLET    Take 10 mg by mouth daily.     Physical Exam: Physical Exam  Constitutional: She is oriented to person, place, and time. She appears well-developed and well-nourished. No distress.  HENT:  Head: Normocephalic and atraumatic.  Right Ear: External ear normal.  Left Ear: External ear normal.  Nose: Nose normal.  Mouth/Throat: Oropharynx is clear and moist.  Cerumen impaction on the right   Eyes: EOM are normal. Pupils are equal, round, and reactive to light.  Neck: Normal range of motion. Neck supple.  Cardiovascular: Normal rate, regular rhythm and normal heart sounds.   Pulmonary/Chest: Effort normal and breath sounds normal.  Abdominal: Soft. Bowel sounds are normal.  Musculoskeletal:  Slow cautious gait- uses walker  Neurological: She is alert and oriented to person, place, and time.  Skin: Skin is warm and dry. She is not diaphoretic.    Filed Vitals:   05/23/12 0853 05/23/12 0949 05/23/12 0952 05/23/12 0954   BP: 130/70 122/78 118/82 110/80  Pulse: 78 44 86 58  Temp: 97.6 F (36.4 C)     TempSrc: Oral     Resp: 16     Height: 5\' 2"  (1.575 m)     Weight: 175 lb (79.379 kg)     SpO2: 95% 95%        Labs reviewed:  Basic Metabolic Panel:  Recent Labs  16/10/96 1057 01/04/12 1144  NA 142 141  K 4.4 4.4  CL 107 109*  CO2 28 25  GLUCOSE 84 88  BUN 27* 29.0*  CREATININE 1.17* 1.3*  CALCIUM 9.7 9.2   Liver Function Tests:  Recent Labs  01/04/12 1144  AST 17  ALT 13  ALKPHOS 67  BILITOT 0.63  PROT 6.5  ALBUMIN 3.3*   No results found for this basename: LIPASE, AMYLASE,  in the last 8760 hours No results found for this basename: AMMONIA,  in the last 8760 hours CBC:  Recent Labs  09/12/11 1057 01/04/12 1144  WBC 3.7* 3.2*  NEUTROABS 2.2 1.7  HGB 11.3* 10.3*  HCT 35.5* 32.9*  MCV 94.4 94.5  PLT 152 160     Assessment/Plan Fall multiple falls- will consult PT/OT and SW at this time. Also recommend more supervision and help throughout the day- she currently has only 8 hours of help and otherwise she is at home alone.   Difficulty walking Ongoing- slow gait with difficutly walking and balance puts pts at increase risk of falls.  Memory loss Score of 20/30 on MMSE failed clock drawing. Will start aricept 5mg  qhs for now and if tolerating this dose can increase to 10mg  when she has her appt with Dr Renato Gails    Cerumen impaction- ear wash done- pt tolerated well reports she can hear better out of that ear  EKG- normal  Orthostatic VS done without evidence of orthostatic hypotension

## 2012-05-23 NOTE — Assessment & Plan Note (Signed)
multiple falls- will consult PT/OT and SW at this time. Also recommend more supervision and help throughout the day- she currently has only 8 hours of help and otherwise she is at home alone.

## 2012-05-23 NOTE — Patient Instructions (Signed)
Recommend more care throughout the day to help with ambulation Will get PT/OT and HH SW involved  Stop taking detrol Start aricept 5mg  daily and may increase to 10 at next visit if there is no side effects

## 2012-05-23 NOTE — Assessment & Plan Note (Addendum)
Ongoing- slow gait with difficutly walking and balance puts pts at increase risk of falls.

## 2012-05-24 DIAGNOSIS — M79609 Pain in unspecified limb: Secondary | ICD-10-CM | POA: Diagnosis not present

## 2012-05-24 DIAGNOSIS — Z9181 History of falling: Secondary | ICD-10-CM

## 2012-05-24 DIAGNOSIS — I10 Essential (primary) hypertension: Secondary | ICD-10-CM | POA: Diagnosis not present

## 2012-05-24 DIAGNOSIS — B351 Tinea unguium: Secondary | ICD-10-CM | POA: Diagnosis not present

## 2012-05-24 DIAGNOSIS — M6281 Muscle weakness (generalized): Secondary | ICD-10-CM

## 2012-05-24 DIAGNOSIS — R262 Difficulty in walking, not elsewhere classified: Secondary | ICD-10-CM

## 2012-05-24 DIAGNOSIS — R413 Other amnesia: Secondary | ICD-10-CM | POA: Diagnosis not present

## 2012-05-24 DIAGNOSIS — M25519 Pain in unspecified shoulder: Secondary | ICD-10-CM | POA: Diagnosis not present

## 2012-05-24 DIAGNOSIS — L84 Corns and callosities: Secondary | ICD-10-CM | POA: Diagnosis not present

## 2012-05-24 DIAGNOSIS — IMO0001 Reserved for inherently not codable concepts without codable children: Secondary | ICD-10-CM

## 2012-05-28 ENCOUNTER — Telehealth: Payer: Self-pay | Admitting: *Deleted

## 2012-05-28 DIAGNOSIS — IMO0001 Reserved for inherently not codable concepts without codable children: Secondary | ICD-10-CM | POA: Diagnosis not present

## 2012-05-28 DIAGNOSIS — Z9181 History of falling: Secondary | ICD-10-CM | POA: Diagnosis not present

## 2012-05-28 DIAGNOSIS — M25519 Pain in unspecified shoulder: Secondary | ICD-10-CM | POA: Diagnosis not present

## 2012-05-28 DIAGNOSIS — R413 Other amnesia: Secondary | ICD-10-CM | POA: Diagnosis not present

## 2012-05-28 DIAGNOSIS — R262 Difficulty in walking, not elsewhere classified: Secondary | ICD-10-CM | POA: Diagnosis not present

## 2012-05-28 DIAGNOSIS — M6281 Muscle weakness (generalized): Secondary | ICD-10-CM | POA: Diagnosis not present

## 2012-05-28 NOTE — Telephone Encounter (Signed)
Per [name], Patient memory is worse, that she recommend at least 16 to 24 hours a day care someone needs to stay with her. Due to patient increase memory loss and falls. Patient was started on Aricept.   Michelle [name] has been notified

## 2012-05-30 DIAGNOSIS — M25519 Pain in unspecified shoulder: Secondary | ICD-10-CM | POA: Diagnosis not present

## 2012-05-30 DIAGNOSIS — R413 Other amnesia: Secondary | ICD-10-CM | POA: Diagnosis not present

## 2012-05-30 DIAGNOSIS — IMO0001 Reserved for inherently not codable concepts without codable children: Secondary | ICD-10-CM | POA: Diagnosis not present

## 2012-05-30 DIAGNOSIS — Z9181 History of falling: Secondary | ICD-10-CM | POA: Diagnosis not present

## 2012-05-30 DIAGNOSIS — R262 Difficulty in walking, not elsewhere classified: Secondary | ICD-10-CM | POA: Diagnosis not present

## 2012-05-30 DIAGNOSIS — M6281 Muscle weakness (generalized): Secondary | ICD-10-CM | POA: Diagnosis not present

## 2012-06-03 DIAGNOSIS — M25519 Pain in unspecified shoulder: Secondary | ICD-10-CM | POA: Diagnosis not present

## 2012-06-03 DIAGNOSIS — M6281 Muscle weakness (generalized): Secondary | ICD-10-CM | POA: Diagnosis not present

## 2012-06-03 DIAGNOSIS — IMO0001 Reserved for inherently not codable concepts without codable children: Secondary | ICD-10-CM | POA: Diagnosis not present

## 2012-06-03 DIAGNOSIS — R262 Difficulty in walking, not elsewhere classified: Secondary | ICD-10-CM | POA: Diagnosis not present

## 2012-06-03 DIAGNOSIS — R413 Other amnesia: Secondary | ICD-10-CM | POA: Diagnosis not present

## 2012-06-03 DIAGNOSIS — Z9181 History of falling: Secondary | ICD-10-CM | POA: Diagnosis not present

## 2012-06-04 DIAGNOSIS — Z9181 History of falling: Secondary | ICD-10-CM | POA: Diagnosis not present

## 2012-06-04 DIAGNOSIS — M6281 Muscle weakness (generalized): Secondary | ICD-10-CM | POA: Diagnosis not present

## 2012-06-04 DIAGNOSIS — R413 Other amnesia: Secondary | ICD-10-CM | POA: Diagnosis not present

## 2012-06-04 DIAGNOSIS — R262 Difficulty in walking, not elsewhere classified: Secondary | ICD-10-CM | POA: Diagnosis not present

## 2012-06-04 DIAGNOSIS — IMO0001 Reserved for inherently not codable concepts without codable children: Secondary | ICD-10-CM | POA: Diagnosis not present

## 2012-06-04 DIAGNOSIS — M25519 Pain in unspecified shoulder: Secondary | ICD-10-CM | POA: Diagnosis not present

## 2012-06-06 ENCOUNTER — Other Ambulatory Visit: Payer: Self-pay | Admitting: *Deleted

## 2012-06-06 DIAGNOSIS — E559 Vitamin D deficiency, unspecified: Secondary | ICD-10-CM

## 2012-06-06 DIAGNOSIS — D649 Anemia, unspecified: Secondary | ICD-10-CM

## 2012-06-06 DIAGNOSIS — E785 Hyperlipidemia, unspecified: Secondary | ICD-10-CM

## 2012-06-06 DIAGNOSIS — I10 Essential (primary) hypertension: Secondary | ICD-10-CM

## 2012-06-10 DIAGNOSIS — M25519 Pain in unspecified shoulder: Secondary | ICD-10-CM | POA: Diagnosis not present

## 2012-06-10 DIAGNOSIS — R262 Difficulty in walking, not elsewhere classified: Secondary | ICD-10-CM | POA: Diagnosis not present

## 2012-06-10 DIAGNOSIS — R413 Other amnesia: Secondary | ICD-10-CM | POA: Diagnosis not present

## 2012-06-10 DIAGNOSIS — Z9181 History of falling: Secondary | ICD-10-CM | POA: Diagnosis not present

## 2012-06-10 DIAGNOSIS — IMO0001 Reserved for inherently not codable concepts without codable children: Secondary | ICD-10-CM | POA: Diagnosis not present

## 2012-06-10 DIAGNOSIS — M6281 Muscle weakness (generalized): Secondary | ICD-10-CM | POA: Diagnosis not present

## 2012-06-11 DIAGNOSIS — R262 Difficulty in walking, not elsewhere classified: Secondary | ICD-10-CM | POA: Diagnosis not present

## 2012-06-11 DIAGNOSIS — Z9181 History of falling: Secondary | ICD-10-CM | POA: Diagnosis not present

## 2012-06-11 DIAGNOSIS — R413 Other amnesia: Secondary | ICD-10-CM | POA: Diagnosis not present

## 2012-06-11 DIAGNOSIS — H4011X0 Primary open-angle glaucoma, stage unspecified: Secondary | ICD-10-CM | POA: Diagnosis not present

## 2012-06-11 DIAGNOSIS — IMO0001 Reserved for inherently not codable concepts without codable children: Secondary | ICD-10-CM | POA: Diagnosis not present

## 2012-06-11 DIAGNOSIS — M6281 Muscle weakness (generalized): Secondary | ICD-10-CM | POA: Diagnosis not present

## 2012-06-11 DIAGNOSIS — H409 Unspecified glaucoma: Secondary | ICD-10-CM | POA: Diagnosis not present

## 2012-06-11 DIAGNOSIS — M25519 Pain in unspecified shoulder: Secondary | ICD-10-CM | POA: Diagnosis not present

## 2012-06-13 DIAGNOSIS — M6281 Muscle weakness (generalized): Secondary | ICD-10-CM | POA: Diagnosis not present

## 2012-06-13 DIAGNOSIS — M25519 Pain in unspecified shoulder: Secondary | ICD-10-CM | POA: Diagnosis not present

## 2012-06-13 DIAGNOSIS — IMO0001 Reserved for inherently not codable concepts without codable children: Secondary | ICD-10-CM | POA: Diagnosis not present

## 2012-06-13 DIAGNOSIS — R262 Difficulty in walking, not elsewhere classified: Secondary | ICD-10-CM | POA: Diagnosis not present

## 2012-06-13 DIAGNOSIS — Z9181 History of falling: Secondary | ICD-10-CM | POA: Diagnosis not present

## 2012-06-13 DIAGNOSIS — R413 Other amnesia: Secondary | ICD-10-CM | POA: Diagnosis not present

## 2012-06-17 DIAGNOSIS — M25519 Pain in unspecified shoulder: Secondary | ICD-10-CM | POA: Diagnosis not present

## 2012-06-17 DIAGNOSIS — M6281 Muscle weakness (generalized): Secondary | ICD-10-CM | POA: Diagnosis not present

## 2012-06-17 DIAGNOSIS — R262 Difficulty in walking, not elsewhere classified: Secondary | ICD-10-CM | POA: Diagnosis not present

## 2012-06-17 DIAGNOSIS — IMO0001 Reserved for inherently not codable concepts without codable children: Secondary | ICD-10-CM | POA: Diagnosis not present

## 2012-06-17 DIAGNOSIS — R413 Other amnesia: Secondary | ICD-10-CM | POA: Diagnosis not present

## 2012-06-17 DIAGNOSIS — Z9181 History of falling: Secondary | ICD-10-CM | POA: Diagnosis not present

## 2012-06-19 DIAGNOSIS — M25519 Pain in unspecified shoulder: Secondary | ICD-10-CM | POA: Diagnosis not present

## 2012-06-19 DIAGNOSIS — IMO0001 Reserved for inherently not codable concepts without codable children: Secondary | ICD-10-CM | POA: Diagnosis not present

## 2012-06-19 DIAGNOSIS — R262 Difficulty in walking, not elsewhere classified: Secondary | ICD-10-CM | POA: Diagnosis not present

## 2012-06-19 DIAGNOSIS — Z9181 History of falling: Secondary | ICD-10-CM | POA: Diagnosis not present

## 2012-06-19 DIAGNOSIS — M6281 Muscle weakness (generalized): Secondary | ICD-10-CM | POA: Diagnosis not present

## 2012-06-19 DIAGNOSIS — R413 Other amnesia: Secondary | ICD-10-CM | POA: Diagnosis not present

## 2012-06-20 DIAGNOSIS — Z9181 History of falling: Secondary | ICD-10-CM | POA: Diagnosis not present

## 2012-06-20 DIAGNOSIS — IMO0001 Reserved for inherently not codable concepts without codable children: Secondary | ICD-10-CM | POA: Diagnosis not present

## 2012-06-20 DIAGNOSIS — R413 Other amnesia: Secondary | ICD-10-CM | POA: Diagnosis not present

## 2012-06-20 DIAGNOSIS — M6281 Muscle weakness (generalized): Secondary | ICD-10-CM | POA: Diagnosis not present

## 2012-06-20 DIAGNOSIS — M25519 Pain in unspecified shoulder: Secondary | ICD-10-CM | POA: Diagnosis not present

## 2012-06-20 DIAGNOSIS — R262 Difficulty in walking, not elsewhere classified: Secondary | ICD-10-CM | POA: Diagnosis not present

## 2012-06-25 DIAGNOSIS — IMO0001 Reserved for inherently not codable concepts without codable children: Secondary | ICD-10-CM | POA: Diagnosis not present

## 2012-06-25 DIAGNOSIS — R262 Difficulty in walking, not elsewhere classified: Secondary | ICD-10-CM | POA: Diagnosis not present

## 2012-06-25 DIAGNOSIS — M6281 Muscle weakness (generalized): Secondary | ICD-10-CM | POA: Diagnosis not present

## 2012-06-25 DIAGNOSIS — M25519 Pain in unspecified shoulder: Secondary | ICD-10-CM | POA: Diagnosis not present

## 2012-06-25 DIAGNOSIS — R413 Other amnesia: Secondary | ICD-10-CM | POA: Diagnosis not present

## 2012-06-25 DIAGNOSIS — Z9181 History of falling: Secondary | ICD-10-CM | POA: Diagnosis not present

## 2012-06-26 ENCOUNTER — Telehealth: Payer: Self-pay | Admitting: Geriatric Medicine

## 2012-06-26 DIAGNOSIS — Z9181 History of falling: Secondary | ICD-10-CM | POA: Diagnosis not present

## 2012-06-26 DIAGNOSIS — M25519 Pain in unspecified shoulder: Secondary | ICD-10-CM | POA: Diagnosis not present

## 2012-06-26 DIAGNOSIS — M6281 Muscle weakness (generalized): Secondary | ICD-10-CM | POA: Diagnosis not present

## 2012-06-26 DIAGNOSIS — R413 Other amnesia: Secondary | ICD-10-CM | POA: Diagnosis not present

## 2012-06-26 DIAGNOSIS — IMO0001 Reserved for inherently not codable concepts without codable children: Secondary | ICD-10-CM | POA: Diagnosis not present

## 2012-06-26 DIAGNOSIS — R262 Difficulty in walking, not elsewhere classified: Secondary | ICD-10-CM | POA: Diagnosis not present

## 2012-06-26 NOTE — Telephone Encounter (Signed)
Made an appointment for patient to be seen for medications on 06/27/2012

## 2012-06-27 ENCOUNTER — Ambulatory Visit (INDEPENDENT_AMBULATORY_CARE_PROVIDER_SITE_OTHER): Payer: Medicare Other | Admitting: Internal Medicine

## 2012-06-27 ENCOUNTER — Encounter: Payer: Self-pay | Admitting: Internal Medicine

## 2012-06-27 VITALS — BP 126/84 | HR 51 | Temp 98.1°F | Resp 14 | Ht 62.0 in | Wt 185.8 lb

## 2012-06-27 DIAGNOSIS — R262 Difficulty in walking, not elsewhere classified: Secondary | ICD-10-CM | POA: Diagnosis not present

## 2012-06-27 DIAGNOSIS — I10 Essential (primary) hypertension: Secondary | ICD-10-CM | POA: Diagnosis not present

## 2012-06-27 DIAGNOSIS — E559 Vitamin D deficiency, unspecified: Secondary | ICD-10-CM | POA: Diagnosis not present

## 2012-06-27 DIAGNOSIS — D649 Anemia, unspecified: Secondary | ICD-10-CM | POA: Diagnosis not present

## 2012-06-27 DIAGNOSIS — N3941 Urge incontinence: Secondary | ICD-10-CM | POA: Diagnosis not present

## 2012-06-27 DIAGNOSIS — IMO0001 Reserved for inherently not codable concepts without codable children: Secondary | ICD-10-CM | POA: Diagnosis not present

## 2012-06-27 DIAGNOSIS — M353 Polymyalgia rheumatica: Secondary | ICD-10-CM

## 2012-06-27 DIAGNOSIS — Z9181 History of falling: Secondary | ICD-10-CM | POA: Diagnosis not present

## 2012-06-27 DIAGNOSIS — E785 Hyperlipidemia, unspecified: Secondary | ICD-10-CM

## 2012-06-27 DIAGNOSIS — M6281 Muscle weakness (generalized): Secondary | ICD-10-CM | POA: Diagnosis not present

## 2012-06-27 DIAGNOSIS — G309 Alzheimer's disease, unspecified: Secondary | ICD-10-CM

## 2012-06-27 DIAGNOSIS — F0280 Dementia in other diseases classified elsewhere without behavioral disturbance: Secondary | ICD-10-CM

## 2012-06-27 DIAGNOSIS — R413 Other amnesia: Secondary | ICD-10-CM | POA: Diagnosis not present

## 2012-06-27 DIAGNOSIS — M25519 Pain in unspecified shoulder: Secondary | ICD-10-CM | POA: Diagnosis not present

## 2012-06-27 MED ORDER — MIRABEGRON ER 25 MG PO TB24: 25.0000 mg | ORAL_TABLET | Freq: Every day | ORAL | Status: DC

## 2012-06-27 MED ORDER — MIRABEGRON ER 50 MG PO TB24: 50.0000 mg | ORAL_TABLET | Freq: Every day | ORAL | Status: DC

## 2012-06-27 NOTE — Patient Instructions (Addendum)
Checked cholesterol and electrolytes today. Try new medication, myrbetriq for your urinary frequency at night.

## 2012-06-27 NOTE — Progress Notes (Signed)
Patient ID: Michelle [name], female   DOB: Aug 14, 1920, 79 y.o.   MRN: [id number] Code Status: DNR  Allergies  Allergen Reactions  . Codeine     States she blacks out    Chief Complaint  Patient presents with  . Medication Problem    Patient wants to go back on Detrol and stopped taking Aricept because of reaction of a nervous feeling.    HPI: Patient is a 79 y.o. female with dementia, urge urinary incontinence and PMR seen in the office today for medication problems.    Has been up 3x at night to urinate since stopped detrol xl.  Otherwise will wet herself.  I had stopped this b/c it was affecting her mentation and that was notable to staff at her facility and myself at her appointment.    Aricept gave bad dreams for 3 days so stopped it also.    Sometimes constipated.  Hot water helps her to go.    Going to therapy 2x per week.  No falls.  Has helped dramatically.    Review of Systems:  Review of Systems  Constitutional: Positive for malaise/fatigue. Negative for fever and chills.  HENT: Negative for congestion.   Eyes: Negative for blurred vision.  Respiratory: Negative for cough and shortness of breath.   Cardiovascular: Negative for chest pain and leg swelling.  Gastrointestinal: Negative for abdominal pain and constipation.  Genitourinary: Positive for urgency and frequency. Negative for dysuria, hematuria and flank pain.  Musculoskeletal: Negative for falls.  Skin: Negative for rash.  Neurological: Positive for weakness. Negative for focal weakness, loss of consciousness and headaches.  Psychiatric/Behavioral: Positive for depression and memory loss. The patient has insomnia.        Due to urinary frequency     Past Medical History  Diagnosis Date  . Hypertension   . Hyperlipidemia LDL goal < 100   . Polymyalgia rheumatica   . GERD (gastroesophageal reflux disease)   . Irritable bowel syndrome   . Diverticulitis   . Sciatica   . Vertigo   . Edema of leg   .  Glaucoma   . Appendicitis   . Arthritis   . Acute upper respiratory infections of unspecified site   . Urge incontinence   . Other specified symptom associated with female genital organs   . Regional enteritis of unspecified site   . Sebaceous cyst   . Depression   . Other malaise and fatigue   . Abnormal weight gain   . Leukocytopenia, unspecified   . Dementia in conditions classified elsewhere without behavioral disturbance(294.10)   . Anemia, unspecified   . Chronic kidney disease, stage III (moderate)   . Unspecified arthropathy, shoulder region   . Other B-complex deficiencies   . Unspecified vitamin D deficiency   . Memory loss   . Abnormality of gait   . Personal history of fall   . Malignant neoplasm of breast (female), unspecified site   . Unspecified glaucoma(365.9)   . Diverticulosis of colon (without mention of hemorrhage)   . Irritable bowel syndrome   . Sciatica   . Dizziness and giddiness    Past Surgical History  Procedure Laterality Date  . Nasal sinus surgery  1966  . Cholecystectomy  1972  . Cataract extraction  1990  . Breast surgery      R breast lumpectomy for benign disease  . Breast lumpectomy  10/28/10    left breast    Social History:   reports that she has  never smoked. She has never used smokeless tobacco. She reports that she does not drink alcohol or use illicit drugs.  Family History  Problem Relation Age of Onset  . Stroke Father   . Hypertension Mother   . Stroke Mother   . Breast cancer Mother   . Heart attack Brother   . Cancer Brother   . Stroke Brother   . Heart attack Son   . Colon cancer Sister     Medications: Patient's Medications  New Prescriptions   MIRABEGRON ER (MYRBETRIQ) 25 MG TB24    Take 1 tablet (25 mg total) by mouth daily. For one week.   MIRABEGRON ER (MYRBETRIQ) 50 MG TB24    Take 1 tablet (50 mg total) by mouth daily.  Previous Medications   AMLODIPINE (NORVASC) 5 MG TABLET    Take 5 mg by mouth daily.    ASPIRIN 81 MG CHEWABLE TABLET    Chew 81 mg by mouth every other day.    BIMATOPROST (LUMIGAN) 0.01 % SOLN    Place 1 drop into both eyes at bedtime.    BRIMONIDINE-TIMOLOL (COMBIGAN) 0.2-0.5 % OPHTHALMIC SOLUTION    Place 1 drop into both eyes 2 (two) times daily.    CALCIUM CARBONATE 200 MG CAPSULE    Take 200 mg by mouth daily.    CITALOPRAM (CELEXA) 10 MG TABLET       DORZOLAMIDE (TRUSOPT) 2 % OPHTHALMIC SOLUTION    Place 1 drop into both eyes 2 (two) times daily.     HYDROCODONE-ACETAMINOPHEN (NORCO/VICODIN) 5-325 MG PER TABLET    Take 1 tablet by mouth every 6 (six) hours as needed.   LETROZOLE (FEMARA) 2.5 MG TABLET    Take 2.5 mg by mouth daily.   LIDOCAINE (LIDODERM) 5 %       MULTIPLE VITAMIN (MULTIVITAMIN WITH MINERALS) TABS    Take 1 tablet by mouth daily.   SIMVASTATIN (ZOCOR) 10 MG TABLET       TRAMADOL (ULTRAM) 50 MG TABLET    Take one tablet three times a day as needed for pain  Modified Medications   No medications on file  Discontinued Medications   DONEPEZIL (ARICEPT) 5 MG TABLET    Take 1 tablet (5 mg total) by mouth at bedtime.   ESOMEPRAZOLE (NEXIUM) 20 MG CAPSULE    Take 20 mg by mouth daily before breakfast.     TOLTERODINE (DETROL LA) 4 MG 24 HR CAPSULE    Take 4 mg by mouth daily.     Physical Exam: Filed Vitals:   06/27/12 0935  BP: 126/84  Pulse: 51  Temp: 98.1 F (36.7 C)  TempSrc: Oral  Resp: 14  Height: 5\' 2"  (1.575 m)  Weight: 185 lb 12.8 oz (84.278 kg)   Physical Exam  Constitutional: She appears well-developed and well-nourished. No distress.  HENT:  Head: Normocephalic and atraumatic.  Neck: No JVD present.  Cardiovascular: Normal rate, regular rhythm, normal heart sounds and intact distal pulses.   No murmur heard. Pulmonary/Chest: Effort normal and breath sounds normal. No respiratory distress. She has no wheezes. She has no rales.  Abdominal: Soft. Bowel sounds are normal. She exhibits no distension and no mass. There is no tenderness.   Musculoskeletal: Normal range of motion.  Neurological: She is alert. No cranial nerve deficit.  Oriented to person and place, not exact time  Skin: Skin is warm and dry.  Psychiatric: She has a normal mood and affect. Her speech is normal and behavior is  normal. Thought content normal. Cognition and memory are impaired. She expresses inappropriate judgment. She exhibits abnormal recent memory.      Labs reviewed: Basic Metabolic Panel:  Recent Labs  16/10/96 1057 01/04/12 1144 06/27/12 1028  NA 142 141 142  K 4.4 4.4 4.5  CL 107 109* 105  CO2 28 25 24   GLUCOSE 84 88 88  BUN 27* 29.0* 22  CREATININE 1.17* 1.3* 1.30*  CALCIUM 9.7 9.2 9.6   Liver Function Tests:  Recent Labs  01/04/12 1144 06/27/12 1028  AST 17 19  ALT 13 14  ALKPHOS 67 71  BILITOT 0.63 0.7  PROT 6.5 6.6  ALBUMIN 3.3*  --    CBC:  Recent Labs  09/12/11 1057 01/04/12 1144  WBC 3.7* 3.2*  NEUTROABS 2.2 1.7  HGB 11.3* 10.3*  HCT 35.5* 32.9*  MCV 94.4 94.5  PLT 152 160   Assessment/Plan Polymyalgia rheumatica Stable.  Off steroids for a long time.  Does best when she exercises regularly.  HTN (hypertension) At goal with comorbidities and age of 78.  Difficulty walking Continue working with PT on this especially for muscle strengthening and balance  Alzheimer's dementia Scored 20/30 in April off the detrol.  Was started on aricept but she stopped it due to nightmares.  Will plan to start namenda instead next visit.  Urge incontinence Seems to have some aspect of overactive bladder.  Would prefer not to use detrol due to delirium risks and other anticholinergic effects (yes better than ditropan, but myrbetriq is to have even fewer concerning effects).  Will start at 25mg  for one week and go up to 50mg  and monitor incontinence and frequency.  Pt to notify me if she has side effects.  She and her caregiver expressed understanding.  Hyperlipidemia LDL goal < 100 Lipids are at goal with  simvastatin.     Labs/tests ordered:  None today

## 2012-06-28 DIAGNOSIS — M25519 Pain in unspecified shoulder: Secondary | ICD-10-CM | POA: Diagnosis not present

## 2012-06-28 DIAGNOSIS — M6281 Muscle weakness (generalized): Secondary | ICD-10-CM | POA: Diagnosis not present

## 2012-06-28 DIAGNOSIS — R413 Other amnesia: Secondary | ICD-10-CM | POA: Diagnosis not present

## 2012-06-28 DIAGNOSIS — R262 Difficulty in walking, not elsewhere classified: Secondary | ICD-10-CM | POA: Diagnosis not present

## 2012-06-28 DIAGNOSIS — Z9181 History of falling: Secondary | ICD-10-CM | POA: Diagnosis not present

## 2012-06-28 DIAGNOSIS — IMO0001 Reserved for inherently not codable concepts without codable children: Secondary | ICD-10-CM | POA: Diagnosis not present

## 2012-06-28 LAB — LIPID PANEL
Chol/HDL Ratio: 2.7 ratio units (ref 0.0–4.4)
Cholesterol, Total: 170 mg/dL (ref 100–199)
HDL: 62 mg/dL (ref >39)
LDL Calculated: 91 mg/dL (ref 0–99)
Triglycerides: 83 mg/dL (ref 0–149)
VLDL Cholesterol Cal: 17 mg/dL (ref 5–40)

## 2012-06-28 LAB — COMPREHENSIVE METABOLIC PANEL
ALT: 14 IU/L (ref 0–32)
AST: 19 IU/L (ref 0–40)
Albumin/Globulin Ratio: 1.6 (ref 1.1–2.5)
Albumin: 4.1 g/dL (ref 3.2–4.6)
Alkaline Phosphatase: 71 IU/L (ref 39–117)
BUN/Creatinine Ratio: 17 (ref 11–26)
BUN: 22 mg/dL (ref 10–36)
CO2: 24 mmol/L (ref 19–28)
Calcium: 9.6 mg/dL (ref 8.6–10.2)
Chloride: 105 mmol/L (ref 97–108)
Creatinine, Ser: 1.3 mg/dL — ABNORMAL HIGH (ref 0.57–1.00)
GFR calc Af Amer: 42 mL/min/{1.73_m2} — ABNORMAL LOW (ref >59)
GFR calc non Af Amer: 36 mL/min/{1.73_m2} — ABNORMAL LOW (ref >59)
Globulin, Total: 2.5 g/dL (ref 1.5–4.5)
Glucose: 88 mg/dL (ref 65–99)
Potassium: 4.5 mmol/L (ref 3.5–5.2)
Sodium: 142 mmol/L (ref 134–144)
Total Bilirubin: 0.7 mg/dL (ref 0.0–1.2)
Total Protein: 6.6 g/dL (ref 6.0–8.5)

## 2012-06-30 ENCOUNTER — Encounter: Payer: Self-pay | Admitting: Internal Medicine

## 2012-06-30 DIAGNOSIS — N3941 Urge incontinence: Secondary | ICD-10-CM | POA: Insufficient documentation

## 2012-06-30 DIAGNOSIS — E785 Hyperlipidemia, unspecified: Secondary | ICD-10-CM | POA: Insufficient documentation

## 2012-06-30 DIAGNOSIS — F0281 Dementia in other diseases classified elsewhere with behavioral disturbance: Secondary | ICD-10-CM | POA: Insufficient documentation

## 2012-06-30 DIAGNOSIS — G309 Alzheimer's disease, unspecified: Secondary | ICD-10-CM | POA: Insufficient documentation

## 2012-06-30 NOTE — Assessment & Plan Note (Signed)
Continue working with PT on this especially for muscle strengthening and balance

## 2012-06-30 NOTE — Assessment & Plan Note (Signed)
Scored 20/30 in April off the detrol.  Was started on aricept but she stopped it due to nightmares.  Will plan to start namenda instead next visit.

## 2012-06-30 NOTE — Assessment & Plan Note (Signed)
Lipids are at goal with simvastatin.

## 2012-06-30 NOTE — Assessment & Plan Note (Signed)
Stable.  Off steroids for a long time.  Does best when she exercises regularly.

## 2012-06-30 NOTE — Assessment & Plan Note (Signed)
At goal with comorbidities and age of 36.

## 2012-06-30 NOTE — Assessment & Plan Note (Signed)
Seems to have some aspect of overactive bladder.  Would prefer not to use detrol due to delirium risks and other anticholinergic effects (yes better than ditropan, but myrbetriq is to have even fewer concerning effects).  Will start at 25mg  for one week and go up to 50mg  and monitor incontinence and frequency.  Pt to notify me if she has side effects.  She and her caregiver expressed understanding.

## 2012-07-01 DIAGNOSIS — M25519 Pain in unspecified shoulder: Secondary | ICD-10-CM | POA: Diagnosis not present

## 2012-07-01 DIAGNOSIS — Z9181 History of falling: Secondary | ICD-10-CM | POA: Diagnosis not present

## 2012-07-01 DIAGNOSIS — M6281 Muscle weakness (generalized): Secondary | ICD-10-CM | POA: Diagnosis not present

## 2012-07-01 DIAGNOSIS — IMO0001 Reserved for inherently not codable concepts without codable children: Secondary | ICD-10-CM | POA: Diagnosis not present

## 2012-07-01 DIAGNOSIS — R262 Difficulty in walking, not elsewhere classified: Secondary | ICD-10-CM | POA: Diagnosis not present

## 2012-07-01 DIAGNOSIS — R413 Other amnesia: Secondary | ICD-10-CM | POA: Diagnosis not present

## 2012-07-03 DIAGNOSIS — IMO0001 Reserved for inherently not codable concepts without codable children: Secondary | ICD-10-CM | POA: Diagnosis not present

## 2012-07-03 DIAGNOSIS — Z9181 History of falling: Secondary | ICD-10-CM | POA: Diagnosis not present

## 2012-07-03 DIAGNOSIS — M6281 Muscle weakness (generalized): Secondary | ICD-10-CM | POA: Diagnosis not present

## 2012-07-03 DIAGNOSIS — R413 Other amnesia: Secondary | ICD-10-CM | POA: Diagnosis not present

## 2012-07-03 DIAGNOSIS — R262 Difficulty in walking, not elsewhere classified: Secondary | ICD-10-CM | POA: Diagnosis not present

## 2012-07-03 DIAGNOSIS — M25519 Pain in unspecified shoulder: Secondary | ICD-10-CM | POA: Diagnosis not present

## 2012-07-08 DIAGNOSIS — M25519 Pain in unspecified shoulder: Secondary | ICD-10-CM | POA: Diagnosis not present

## 2012-07-08 DIAGNOSIS — R413 Other amnesia: Secondary | ICD-10-CM | POA: Diagnosis not present

## 2012-07-08 DIAGNOSIS — R262 Difficulty in walking, not elsewhere classified: Secondary | ICD-10-CM | POA: Diagnosis not present

## 2012-07-08 DIAGNOSIS — IMO0001 Reserved for inherently not codable concepts without codable children: Secondary | ICD-10-CM | POA: Diagnosis not present

## 2012-07-08 DIAGNOSIS — M6281 Muscle weakness (generalized): Secondary | ICD-10-CM | POA: Diagnosis not present

## 2012-07-08 DIAGNOSIS — Z9181 History of falling: Secondary | ICD-10-CM | POA: Diagnosis not present

## 2012-07-10 DIAGNOSIS — Z9181 History of falling: Secondary | ICD-10-CM | POA: Diagnosis not present

## 2012-07-10 DIAGNOSIS — R262 Difficulty in walking, not elsewhere classified: Secondary | ICD-10-CM | POA: Diagnosis not present

## 2012-07-10 DIAGNOSIS — M25519 Pain in unspecified shoulder: Secondary | ICD-10-CM | POA: Diagnosis not present

## 2012-07-10 DIAGNOSIS — R413 Other amnesia: Secondary | ICD-10-CM | POA: Diagnosis not present

## 2012-07-10 DIAGNOSIS — IMO0001 Reserved for inherently not codable concepts without codable children: Secondary | ICD-10-CM | POA: Diagnosis not present

## 2012-07-10 DIAGNOSIS — M6281 Muscle weakness (generalized): Secondary | ICD-10-CM | POA: Diagnosis not present

## 2012-07-11 ENCOUNTER — Other Ambulatory Visit: Payer: Self-pay

## 2012-07-12 ENCOUNTER — Encounter: Payer: Self-pay | Admitting: *Deleted

## 2012-07-15 ENCOUNTER — Ambulatory Visit (INDEPENDENT_AMBULATORY_CARE_PROVIDER_SITE_OTHER): Payer: Medicare Other | Admitting: Internal Medicine

## 2012-07-15 ENCOUNTER — Ambulatory Visit: Payer: Self-pay | Admitting: Internal Medicine

## 2012-07-15 ENCOUNTER — Encounter: Payer: Self-pay | Admitting: Internal Medicine

## 2012-07-15 VITALS — BP 122/88 | HR 64 | Temp 96.8°F | Resp 18 | Ht 62.0 in | Wt 186.0 lb

## 2012-07-15 DIAGNOSIS — Z9181 History of falling: Secondary | ICD-10-CM | POA: Diagnosis not present

## 2012-07-15 DIAGNOSIS — D0590 Unspecified type of carcinoma in situ of unspecified breast: Secondary | ICD-10-CM

## 2012-07-15 DIAGNOSIS — M6281 Muscle weakness (generalized): Secondary | ICD-10-CM | POA: Diagnosis not present

## 2012-07-15 DIAGNOSIS — R262 Difficulty in walking, not elsewhere classified: Secondary | ICD-10-CM | POA: Diagnosis not present

## 2012-07-15 DIAGNOSIS — N3941 Urge incontinence: Secondary | ICD-10-CM

## 2012-07-15 DIAGNOSIS — F0280 Dementia in other diseases classified elsewhere without behavioral disturbance: Secondary | ICD-10-CM

## 2012-07-15 DIAGNOSIS — E785 Hyperlipidemia, unspecified: Secondary | ICD-10-CM

## 2012-07-15 DIAGNOSIS — IMO0001 Reserved for inherently not codable concepts without codable children: Secondary | ICD-10-CM | POA: Diagnosis not present

## 2012-07-15 DIAGNOSIS — G309 Alzheimer's disease, unspecified: Secondary | ICD-10-CM

## 2012-07-15 DIAGNOSIS — M25519 Pain in unspecified shoulder: Secondary | ICD-10-CM | POA: Diagnosis not present

## 2012-07-15 DIAGNOSIS — D0512 Intraductal carcinoma in situ of left breast: Secondary | ICD-10-CM

## 2012-07-15 DIAGNOSIS — R413 Other amnesia: Secondary | ICD-10-CM | POA: Diagnosis not present

## 2012-07-15 NOTE — Progress Notes (Signed)
Patient ID: Michelle [name], female   DOB: 02/27/1920, 79 y.o.   MRN: [id number] Location:  [facility]   Allergies  Allergen Reactions  . Codeine     States she blacks out    Chief Complaint  Patient presents with  . Medical Managment of Chronic Issues    refills on meds    HPI: Patient is a 79 y.o. female seen in the office today for medical mgt of chronic diseases.    Is getting used to her new medication.  Doing well.  Still urinating every 3 hours, but is able to hold it now with myrbetriq.  Occasional short-lived headache lasting 15 mins or less.  Had been having before the medication.      Is walking twice a day and doing her three pages of exercises from therapy.  No falls since last time.  Amlodipine and celexa refilled.  Review of Systems:  Review of Systems  Constitutional: Negative for fever and chills.  HENT: Negative for congestion.   Eyes: Positive for blurred vision.  Cardiovascular: Negative for chest pain.  Gastrointestinal: Negative for abdominal pain, constipation, blood in stool and melena.  Genitourinary: Positive for urgency and frequency.  Musculoskeletal: Positive for back pain and falls.  Skin: Negative for rash.  Neurological: Positive for dizziness.  Endo/Heme/Allergies: Bruises/bleeds easily.  Psychiatric/Behavioral: Positive for memory loss.    Past Medical History  Diagnosis Date  . Hypertension   . Hyperlipidemia LDL goal < 100   . Polymyalgia rheumatica   . GERD (gastroesophageal reflux disease)   . Irritable bowel syndrome   . Diverticulitis   . Sciatica   . Vertigo   . Edema of leg   . Glaucoma   . Appendicitis   . Arthritis   . Acute upper respiratory infections of unspecified site   . Urge incontinence   . Other specified symptom associated with female genital organs   . Regional enteritis of unspecified site   . Sebaceous cyst   . Depression   . Other malaise and fatigue   .  Abnormal weight gain   . Leukocytopenia, unspecified   . Dementia in conditions classified elsewhere without behavioral disturbance(294.10)   . Anemia, unspecified   . Chronic kidney disease, stage III (moderate)   . Unspecified arthropathy, shoulder region   . Other B-complex deficiencies   . Unspecified vitamin D deficiency   . Memory loss   . Abnormality of gait   . Personal history of fall   . Malignant neoplasm of breast (female), unspecified site   . Unspecified glaucoma(365.9)   . Diverticulosis of colon (without mention of hemorrhage)   . Irritable bowel syndrome   . Sciatica   . Dizziness and giddiness     Past Surgical History  Procedure Laterality Date  . Nasal sinus surgery  1966  . Cholecystectomy  1972  . Cataract extraction  1990  . Breast surgery      R breast lumpectomy for benign disease  . Breast lumpectomy  10/28/10    left breast     Social History:   reports that she has never smoked. She has never used smokeless tobacco. She reports that she does not drink alcohol or use illicit drugs.  Family History  Problem Relation Age of Onset  . Stroke Father   . Hypertension Mother   . Stroke Mother   . Breast cancer Mother   . Heart attack Brother   . Cancer Brother   .  Stroke Brother   . Heart attack Son   . Colon cancer Sister     Medications: Patient's Medications  New Prescriptions   No medications on file  Previous Medications   AMLODIPINE (NORVASC) 5 MG TABLET    Take 5 mg by mouth daily.   ASPIRIN 81 MG CHEWABLE TABLET    Chew 81 mg by mouth every other day.    BIMATOPROST (LUMIGAN) 0.01 % SOLN    Place 1 drop into both eyes at bedtime.    BRIMONIDINE-TIMOLOL (COMBIGAN) 0.2-0.5 % OPHTHALMIC SOLUTION    Place 1 drop into both eyes 2 (two) times daily.    CALCIUM CARBONATE 200 MG CAPSULE    Take 200 mg by mouth daily.    CITALOPRAM (CELEXA) 10 MG TABLET       DORZOLAMIDE (TRUSOPT) 2 % OPHTHALMIC SOLUTION    Place 1 drop into both eyes 2 (two)  times daily.     HYDROCODONE-ACETAMINOPHEN (NORCO/VICODIN) 5-325 MG PER TABLET    Take 1 tablet by mouth every 6 (six) hours as needed.   LETROZOLE (FEMARA) 2.5 MG TABLET    Take 2.5 mg by mouth daily.   LIDOCAINE (LIDODERM) 5 %       MIRABEGRON ER (MYRBETRIQ) 25 MG TB24    Take 1 tablet (25 mg total) by mouth daily. For one week.   MIRABEGRON ER (MYRBETRIQ) 50 MG TB24    Take 1 tablet (50 mg total) by mouth daily.   MULTIPLE VITAMIN (MULTIVITAMIN WITH MINERALS) TABS    Take 1 tablet by mouth daily.   SIMVASTATIN (ZOCOR) 10 MG TABLET       TRAMADOL (ULTRAM) 50 MG TABLET    Take one tablet three times a day as needed for pain  Modified Medications   No medications on file  Discontinued Medications   No medications on file     Physical Exam: Filed Vitals:   07/15/12 1129  BP: 122/88  Pulse: 64  Temp: 96.8 F (36 C)  TempSrc: Oral  Resp: 18  Height: 5\' 2"  (1.575 m)  Weight: 186 lb (84.369 kg)  SpO2: 95%  Physical Exam  Constitutional: She appears well-developed and well-nourished. No distress.  HENT:  Head: Normocephalic and atraumatic.  Cardiovascular: Normal rate, regular rhythm, normal heart sounds and intact distal pulses.   Pulmonary/Chest: Effort normal and breath sounds normal. No respiratory distress.  Abdominal: Soft. Bowel sounds are normal. She exhibits no distension. There is no tenderness.  Musculoskeletal: Normal range of motion.  Walks with walker very slowly  Neurological: She is alert. No cranial nerve deficit.  Skin: Skin is warm and dry.    Labs reviewed: Basic Metabolic Panel:  Recent Labs  78/29/56 1057 01/04/12 1144 06/27/12 1028  NA 142 141 142  K 4.4 4.4 4.5  CL 107 109* 105  CO2 28 25 24   GLUCOSE 84 88 88  BUN 27* 29.0* 22  CREATININE 1.17* 1.3* 1.30*  CALCIUM 9.7 9.2 9.6   Liver Function Tests:  Recent Labs  01/04/12 1144 06/27/12 1028  AST 17 19  ALT 13 14  ALKPHOS 67 71  BILITOT 0.63 0.7  PROT 6.5 6.6  ALBUMIN 3.3*  --    CBC:  Recent Labs  09/12/11 1057 01/04/12 1144  WBC 3.7* 3.2*  NEUTROABS 2.2 1.7  HGB 11.3* 10.3*  HCT 35.5* 32.9*  MCV 94.4 94.5  PLT 152 160   Lipid Panel:  Recent Labs  06/27/12 1028  HDL 62  LDLCALC 91  TRIG 83  CHOLHDL 2.7   Assessment/Plan 1. DCIS (ductal carcinoma in situ), left -in the past--needs f/u mammography - MM DIGITAL DIAGNOSTIC BILATERAL  2. Hyperlipidemia LDL goal < 100 -cont zocor - CMP; Future  3. Urge incontinence - discussed medication therapy options for this--consider myrbetriq, avoid anticholinergics which will worsen her memory and increase her risk of falls and constipation   4. Alzheimer's dementia - high risk for delirium from infections, volume depletion, cont current meds and activities - CBC with Differential; Future - CMP; Future  Labs/tests ordered: Orders Placed This Encounter  Procedures  . MM DIGITAL DIAGNOSTIC BILATERAL  . CBC with Differential    Standing Status: Future     Number of Occurrences:      Standing Expiration Date: 03/17/2013  . CMP    Standing Status: Future     Number of Occurrences:      Standing Expiration Date: 03/17/2013   Next appt:  3 mos

## 2012-07-17 ENCOUNTER — Other Ambulatory Visit: Payer: Self-pay | Admitting: Geriatric Medicine

## 2012-07-17 DIAGNOSIS — Z9181 History of falling: Secondary | ICD-10-CM | POA: Diagnosis not present

## 2012-07-17 DIAGNOSIS — R413 Other amnesia: Secondary | ICD-10-CM | POA: Diagnosis not present

## 2012-07-17 DIAGNOSIS — M25519 Pain in unspecified shoulder: Secondary | ICD-10-CM | POA: Diagnosis not present

## 2012-07-17 DIAGNOSIS — IMO0001 Reserved for inherently not codable concepts without codable children: Secondary | ICD-10-CM | POA: Diagnosis not present

## 2012-07-17 DIAGNOSIS — R262 Difficulty in walking, not elsewhere classified: Secondary | ICD-10-CM | POA: Diagnosis not present

## 2012-07-17 DIAGNOSIS — M6281 Muscle weakness (generalized): Secondary | ICD-10-CM | POA: Diagnosis not present

## 2012-07-17 MED ORDER — CITALOPRAM HYDROBROMIDE 10 MG PO TABS: 10.0000 mg | ORAL_TABLET | Freq: Every day | ORAL | Status: DC

## 2012-07-17 MED ORDER — AMLODIPINE BESYLATE 5 MG PO TABS: 5.0000 mg | ORAL_TABLET | Freq: Every day | ORAL | Status: DC

## 2012-07-30 ENCOUNTER — Ambulatory Visit (INDEPENDENT_AMBULATORY_CARE_PROVIDER_SITE_OTHER): Payer: Medicare Other | Admitting: Nurse Practitioner

## 2012-07-30 ENCOUNTER — Encounter: Payer: Self-pay | Admitting: Nurse Practitioner

## 2012-07-30 VITALS — BP 110/72 | HR 59 | Temp 97.6°F | Resp 12 | Ht 62.0 in | Wt 177.0 lb

## 2012-07-30 DIAGNOSIS — M25569 Pain in unspecified knee: Secondary | ICD-10-CM

## 2012-07-30 DIAGNOSIS — M25561 Pain in right knee: Secondary | ICD-10-CM

## 2012-07-30 NOTE — Progress Notes (Signed)
Patient ID: Michelle [name], female   DOB: 1920/10/18, 79 y.o.   MRN: [id number]   Allergies  Allergen Reactions  . Codeine     States she blacks out    Chief Complaint  Patient presents with  . Acute Visit    right leg pain    HPI: Patient is a 79 y.o. female seen in the office today for pain in leg Reports on Saturday she twisted her knee when trying to get out of bed to go to the bathroom and now she can not stand up due to the pain. Is able to bear weight but the pain is worse. Has been taking tramadol three times a day and a pain cream ointment from [facility]- reports the pain is getting better and has improved significantly from yesterday to today   Review of Systems:  Review of Systems  Constitutional: Negative for fever, chills and malaise/fatigue.  Cardiovascular: Negative for chest pain and leg swelling.  Genitourinary: Negative for dysuria, urgency and frequency.  Musculoskeletal: Positive for joint pain (knee). Negative for myalgias.  Neurological: Negative for weakness.     Past Medical History  Diagnosis Date  . Hypertension   . Hyperlipidemia LDL goal < 100   . Polymyalgia rheumatica   . GERD (gastroesophageal reflux disease)   . Irritable bowel syndrome   . Diverticulitis   . Sciatica   . Vertigo   . Edema of leg   . Glaucoma   . Appendicitis   . Arthritis   . Acute upper respiratory infections of unspecified site   . Urge incontinence   . Other specified symptom associated with female genital organs   . Regional enteritis of unspecified site   . Sebaceous cyst   . Depression   . Other malaise and fatigue   . Abnormal weight gain   . Leukocytopenia, unspecified   . Dementia in conditions classified elsewhere without behavioral disturbance(294.10)   . Anemia, unspecified   . Chronic kidney disease, stage III (moderate)   . Unspecified arthropathy, shoulder region   . Other B-complex deficiencies   . Unspecified vitamin D deficiency   . Memory loss    . Abnormality of gait   . Personal history of fall   . Malignant neoplasm of breast (female), unspecified site   . Unspecified glaucoma(365.9)   . Diverticulosis of colon (without mention of hemorrhage)   . Irritable bowel syndrome   . Sciatica   . Dizziness and giddiness    Past Surgical History  Procedure Laterality Date  . Nasal sinus surgery  1966  . Cholecystectomy  1972  . Cataract extraction  1990  . Breast surgery      R breast lumpectomy for benign disease  . Breast lumpectomy  10/28/10    left breast    Social History:   reports that she has never smoked. She has never used smokeless tobacco. She reports that she does not drink alcohol or use illicit drugs.  Family History  Problem Relation Age of Onset  . Stroke Father   . Hypertension Mother   . Stroke Mother   . Breast cancer Mother   . Heart attack Brother   . Cancer Brother   . Stroke Brother   . Heart attack Son   . Colon cancer Sister     Medications: Patient's Medications  New Prescriptions   No medications on file  Previous Medications   AMLODIPINE (NORVASC) 5 MG TABLET    Take 1 tablet (5 mg total)  by mouth daily.   ASPIRIN 81 MG CHEWABLE TABLET    Chew 81 mg by mouth every other day.    BIMATOPROST (LUMIGAN) 0.01 % SOLN    Place 1 drop into both eyes at bedtime.    BRIMONIDINE-TIMOLOL (COMBIGAN) 0.2-0.5 % OPHTHALMIC SOLUTION    Place 1 drop into both eyes 2 (two) times daily.    CALCIUM CARBONATE 200 MG CAPSULE    Take 200 mg by mouth daily.    CITALOPRAM (CELEXA) 10 MG TABLET    Take 1 tablet (10 mg total) by mouth daily.   DORZOLAMIDE (TRUSOPT) 2 % OPHTHALMIC SOLUTION    Place 1 drop into both eyes 2 (two) times daily.     HYDROCODONE-ACETAMINOPHEN (NORCO/VICODIN) 5-325 MG PER TABLET    Take 1 tablet by mouth every 6 (six) hours as needed.   LETROZOLE (FEMARA) 2.5 MG TABLET    Take 2.5 mg by mouth daily.   LIDOCAINE (LIDODERM) 5 %       MIRABEGRON ER (MYRBETRIQ) 25 MG TB24    Take 1 tablet (25  mg total) by mouth daily. For one week.   MIRABEGRON ER (MYRBETRIQ) 50 MG TB24    Take 1 tablet (50 mg total) by mouth daily.   MULTIPLE VITAMIN (MULTIVITAMIN WITH MINERALS) TABS    Take 1 tablet by mouth daily.   SIMVASTATIN (ZOCOR) 10 MG TABLET       TRAMADOL (ULTRAM) 50 MG TABLET    Take one tablet three times a day as needed for pain  Modified Medications   No medications on file  Discontinued Medications   No medications on file     Physical Exam:  Filed Vitals:   07/30/12 1020  BP: 110/72  Pulse: 59  Temp: 97.6 F (36.4 C)  TempSrc: Oral  Resp: 12  Height: 5\' 2"  (1.575 m)  Weight: 177 lb (80.287 kg)  SpO2: 97%    Physical Exam  Constitutional: She is well-developed, well-nourished, and in no distress. No distress.  Cardiovascular: Normal rate and regular rhythm.   Pulmonary/Chest: Effort normal and breath sounds normal.  Musculoskeletal:       Right knee: She exhibits normal range of motion, no swelling, no effusion, no ecchymosis, no laceration, normal alignment and normal patellar mobility. Tenderness (to lateral side of right knee) found.  Skin: She is not diaphoretic.   (type .physexam)  Labs reviewed: Basic Metabolic Panel:  Recent Labs  16/10/96 1057 01/04/12 1144 06/27/12 1028  NA 142 141 142  K 4.4 4.4 4.5  CL 107 109* 105  CO2 28 25 24   GLUCOSE 84 88 88  BUN 27* 29.0* 22  CREATININE 1.17* 1.3* 1.30*  CALCIUM 9.7 9.2 9.6   Liver Function Tests:  Recent Labs  01/04/12 1144 06/27/12 1028  AST 17 19  ALT 13 14  ALKPHOS 67 71  BILITOT 0.63 0.7  PROT 6.5 6.6  ALBUMIN 3.3*  --    No results found for this basename: LIPASE, AMYLASE,  in the last 8760 hours No results found for this basename: AMMONIA,  in the last 8760 hours CBC:  Recent Labs  09/12/11 1057 01/04/12 1144  WBC 3.7* 3.2*  NEUTROABS 2.2 1.7  HGB 11.3* 10.3*  HCT 35.5* 32.9*  MCV 94.4 94.5  PLT 152 160   Lipid Panel:  Recent Labs  06/27/12 1028  HDL 62  LDLCALC  91  TRIG 83  CHOLHDL 2.7    Assessment/Plan  Knee pain, right- encouraged good body positioning  and help getting out of bed; Place ice on knee for 20 mins 2-3 times daily ;May use Aleve 1 tablet twice daily for 3 days to help with inflammation- if needed may use for 5 days but do not use aleve after that to Increase hydration throughout the day but after 6 pm just minimally to help reduce the need to get up at night to urinate To follow up if pain get worse or pt unable to bear weight on leg; if there is any redness, swelling, or heat to the joint

## 2012-07-30 NOTE — Patient Instructions (Addendum)
Place ice on knee for 20 mins 2-3 times daily  May use Aleve 1 tablet twice daily for 3 days to help with inflammation- if needed may use for 5 days but do not use aleve after that Increase hydration throughout the day but after 6 pm just minimally to help reduce the need to get up at night to urinate

## 2012-08-12 ENCOUNTER — Other Ambulatory Visit: Payer: Self-pay | Admitting: Geriatric Medicine

## 2012-08-12 DIAGNOSIS — N3941 Urge incontinence: Secondary | ICD-10-CM

## 2012-08-12 MED ORDER — MIRABEGRON ER 50 MG PO TB24: 50.0000 mg | ORAL_TABLET | Freq: Every day | ORAL | Status: DC

## 2012-08-15 ENCOUNTER — Telehealth: Payer: Self-pay | Admitting: Geriatric Medicine

## 2012-08-15 ENCOUNTER — Other Ambulatory Visit: Payer: Self-pay | Admitting: Internal Medicine

## 2012-08-15 DIAGNOSIS — Z853 Personal history of malignant neoplasm of breast: Secondary | ICD-10-CM

## 2012-08-15 NOTE — Telephone Encounter (Signed)
I would recommend she take extra strength tylenol 500mg  2 tablets three times a day along with her tramadol for a few days.  Aleve is bad for her kidneys.  She could also use over the counter topical medication like icy hot or theragesic cream.

## 2012-08-15 NOTE — Telephone Encounter (Signed)
Patient aware.

## 2012-08-15 NOTE — Telephone Encounter (Signed)
Patient is having right knee pain. She takes tramadol 3 times daily. Should the patient go back on Aleve for a few days?

## 2012-08-27 ENCOUNTER — Emergency Department (HOSPITAL_COMMUNITY)
Admission: EM | Admit: 2012-08-27 | Discharge: 2012-08-28 | Disposition: A | Discharge status: Home / Self Care | Payer: Medicare Other | Attending: Emergency Medicine | Admitting: Emergency Medicine

## 2012-08-27 ENCOUNTER — Encounter (HOSPITAL_COMMUNITY): Payer: Self-pay

## 2012-08-27 DIAGNOSIS — S0990XA Unspecified injury of head, initial encounter: Secondary | ICD-10-CM | POA: Diagnosis not present

## 2012-08-27 DIAGNOSIS — F329 Major depressive disorder, single episode, unspecified: Secondary | ICD-10-CM | POA: Diagnosis not present

## 2012-08-27 DIAGNOSIS — Z9181 History of falling: Secondary | ICD-10-CM | POA: Insufficient documentation

## 2012-08-27 DIAGNOSIS — M545 Low back pain: Secondary | ICD-10-CM | POA: Diagnosis not present

## 2012-08-27 DIAGNOSIS — M5430 Sciatica, unspecified side: Secondary | ICD-10-CM | POA: Diagnosis not present

## 2012-08-27 DIAGNOSIS — T1490XA Injury, unspecified, initial encounter: Secondary | ICD-10-CM | POA: Diagnosis not present

## 2012-08-27 DIAGNOSIS — M353 Polymyalgia rheumatica: Secondary | ICD-10-CM | POA: Diagnosis not present

## 2012-08-27 DIAGNOSIS — Y929 Unspecified place or not applicable: Secondary | ICD-10-CM | POA: Insufficient documentation

## 2012-08-27 DIAGNOSIS — E785 Hyperlipidemia, unspecified: Secondary | ICD-10-CM | POA: Diagnosis not present

## 2012-08-27 DIAGNOSIS — Z8742 Personal history of other diseases of the female genital tract: Secondary | ICD-10-CM | POA: Diagnosis not present

## 2012-08-27 DIAGNOSIS — M19019 Primary osteoarthritis, unspecified shoulder: Secondary | ICD-10-CM | POA: Insufficient documentation

## 2012-08-27 DIAGNOSIS — R9431 Abnormal electrocardiogram [ECG] [EKG]: Secondary | ICD-10-CM | POA: Diagnosis not present

## 2012-08-27 DIAGNOSIS — M129 Arthropathy, unspecified: Secondary | ICD-10-CM | POA: Diagnosis not present

## 2012-08-27 DIAGNOSIS — W19XXXA Unspecified fall, initial encounter: Secondary | ICD-10-CM

## 2012-08-27 DIAGNOSIS — Z862 Personal history of diseases of the blood and blood-forming organs and certain disorders involving the immune mechanism: Secondary | ICD-10-CM | POA: Diagnosis not present

## 2012-08-27 DIAGNOSIS — Z8679 Personal history of other diseases of the circulatory system: Secondary | ICD-10-CM | POA: Insufficient documentation

## 2012-08-27 DIAGNOSIS — Z79899 Other long term (current) drug therapy: Secondary | ICD-10-CM | POA: Diagnosis not present

## 2012-08-27 DIAGNOSIS — IMO0002 Reserved for concepts with insufficient information to code with codable children: Secondary | ICD-10-CM | POA: Diagnosis not present

## 2012-08-27 DIAGNOSIS — Z853 Personal history of malignant neoplasm of breast: Secondary | ICD-10-CM | POA: Diagnosis not present

## 2012-08-27 DIAGNOSIS — Z872 Personal history of diseases of the skin and subcutaneous tissue: Secondary | ICD-10-CM | POA: Insufficient documentation

## 2012-08-27 DIAGNOSIS — S3981XA Other specified injuries of abdomen, initial encounter: Secondary | ICD-10-CM | POA: Diagnosis not present

## 2012-08-27 DIAGNOSIS — T07XXXA Unspecified multiple injuries, initial encounter: Secondary | ICD-10-CM

## 2012-08-27 DIAGNOSIS — R296 Repeated falls: Secondary | ICD-10-CM | POA: Insufficient documentation

## 2012-08-27 DIAGNOSIS — Z8719 Personal history of other diseases of the digestive system: Secondary | ICD-10-CM | POA: Insufficient documentation

## 2012-08-27 DIAGNOSIS — Z8659 Personal history of other mental and behavioral disorders: Secondary | ICD-10-CM | POA: Insufficient documentation

## 2012-08-27 DIAGNOSIS — F3289 Other specified depressive episodes: Secondary | ICD-10-CM | POA: Insufficient documentation

## 2012-08-27 DIAGNOSIS — I10 Essential (primary) hypertension: Secondary | ICD-10-CM | POA: Insufficient documentation

## 2012-08-27 DIAGNOSIS — Z7982 Long term (current) use of aspirin: Secondary | ICD-10-CM | POA: Diagnosis not present

## 2012-08-27 DIAGNOSIS — H409 Unspecified glaucoma: Secondary | ICD-10-CM | POA: Diagnosis not present

## 2012-08-27 DIAGNOSIS — N9489 Other specified conditions associated with female genital organs and menstrual cycle: Secondary | ICD-10-CM | POA: Diagnosis not present

## 2012-08-27 DIAGNOSIS — S0083XA Contusion of other part of head, initial encounter: Secondary | ICD-10-CM | POA: Diagnosis not present

## 2012-08-27 DIAGNOSIS — Y9389 Activity, other specified: Secondary | ICD-10-CM | POA: Insufficient documentation

## 2012-08-27 DIAGNOSIS — E559 Vitamin D deficiency, unspecified: Secondary | ICD-10-CM | POA: Insufficient documentation

## 2012-08-27 NOTE — ED Notes (Signed)
JYN:[id number]<AO> Expected date:08/27/12<BR> Expected time:10:57 PM<BR> Means of arrival:Ambulance<BR> Comments:<BR> 79 yo F  Fall  Right flank pain, bump on head

## 2012-08-27 NOTE — ED Notes (Signed)
Pt presents from [place] with c/o a fall. Per staff at facility, pt tripped over her walker. Per EMS, pt was sitting upright on the floor when she got there. Pt told EMS that she had right flank pain and pt also has bruise to left side of her forehead.

## 2012-08-28 ENCOUNTER — Emergency Department (HOSPITAL_COMMUNITY): Payer: Medicare Other

## 2012-08-28 DIAGNOSIS — F0390 Unspecified dementia without behavioral disturbance: Secondary | ICD-10-CM | POA: Diagnosis not present

## 2012-08-28 DIAGNOSIS — M19019 Primary osteoarthritis, unspecified shoulder: Secondary | ICD-10-CM | POA: Diagnosis not present

## 2012-08-28 DIAGNOSIS — G40909 Epilepsy, unspecified, not intractable, without status epilepticus: Secondary | ICD-10-CM | POA: Diagnosis not present

## 2012-08-28 DIAGNOSIS — S3981XA Other specified injuries of abdomen, initial encounter: Secondary | ICD-10-CM | POA: Diagnosis not present

## 2012-08-28 DIAGNOSIS — M545 Low back pain: Secondary | ICD-10-CM | POA: Diagnosis not present

## 2012-08-28 DIAGNOSIS — S0990XA Unspecified injury of head, initial encounter: Secondary | ICD-10-CM | POA: Diagnosis not present

## 2012-08-28 DIAGNOSIS — IMO0002 Reserved for concepts with insufficient information to code with codable children: Secondary | ICD-10-CM | POA: Diagnosis not present

## 2012-08-28 MED ORDER — ACETAMINOPHEN 325 MG PO TABS
650.0000 mg | ORAL_TABLET | Freq: Once | ORAL | Status: AC
  Administered 2012-08-28: 650 mg via ORAL
  Filled 2012-08-28: qty 2

## 2012-08-28 NOTE — ED Provider Notes (Signed)
CSN: [id number]     Arrival date & time 08/27/12  2316 History     First MD Initiated Contact with Patient 08/27/12 2339     Chief Complaint  Patient presents with  . Fall   (Consider location/radiation/quality/duration/timing/severity/associated sxs/prior Treatment) HPI Hx per PT -  Getting ready for bed and lost her balance falling onto her back, now has lower back pain.  EMS was called, and reports R flank pain, PT denies any ABD pain or flank pain. She has some "arthritis" in her right shoulder no weakness or numbness to extremities. PT has ecchymosis L forehead - she denies any neck pain. She declines any pain medications - 2 tylenol PTA  Past Medical History  Diagnosis Date  . Hypertension   . Hyperlipidemia LDL goal < 100   . Polymyalgia rheumatica   . GERD (gastroesophageal reflux disease)   . Irritable bowel syndrome   . Diverticulitis   . Sciatica   . Vertigo   . Edema of leg   . Glaucoma   . Appendicitis   . Arthritis   . Acute upper respiratory infections of unspecified site   . Urge incontinence   . Other specified symptom associated with female genital organs   . Regional enteritis of unspecified site   . Sebaceous cyst   . Depression   . Other malaise and fatigue   . Abnormal weight gain   . Leukocytopenia, unspecified   . Dementia in conditions classified elsewhere without behavioral disturbance(294.10)   . Anemia, unspecified   . Chronic kidney disease, stage III (moderate)   . Unspecified arthropathy, shoulder region   . Other B-complex deficiencies   . Unspecified vitamin D deficiency   . Memory loss   . Abnormality of gait   . Personal history of fall   . Malignant neoplasm of breast (female), unspecified site   . Unspecified glaucoma(365.9)   . Diverticulosis of colon (without mention of hemorrhage)   . Irritable bowel syndrome   . Sciatica   . Dizziness and giddiness    Past Surgical History  Procedure Laterality Date  . Nasal sinus  surgery  1966  . Cholecystectomy  1972  . Cataract extraction  1990  . Breast surgery      R breast lumpectomy for benign disease  . Breast lumpectomy  10/28/10    left breast    Family History  Problem Relation Age of Onset  . Stroke Father   . Hypertension Mother   . Stroke Mother   . Breast cancer Mother   . Heart attack Brother   . Cancer Brother   . Stroke Brother   . Heart attack Son   . Colon cancer Sister    History  Substance Use Topics  . Smoking status: Never Smoker   . Smokeless tobacco: Never Used  . Alcohol Use: No   OB History   Grav Para Term Preterm Abortions TAB SAB Ect Mult Living                 Review of Systems  Constitutional: Negative for fever and chills.  HENT: Negative for neck pain and neck stiffness.   Eyes: Negative for visual disturbance.  Respiratory: Negative for shortness of breath.   Cardiovascular: Negative for chest pain.  Gastrointestinal: Negative for vomiting and abdominal pain.  Genitourinary: Negative for flank pain.  Musculoskeletal: Negative for back pain.  Skin: Positive for wound. Negative for rash.  Neurological: Negative for headaches.  All other systems reviewed  and are negative.    Allergies  Codeine  Home Medications   Current Outpatient Rx  Name  Route  Sig  Dispense  Refill  . amLODipine (NORVASC) 5 MG tablet   Oral   Take 1 tablet (5 mg total) by mouth daily.   30 tablet   3   . aspirin 81 MG chewable tablet   Oral   Chew 81 mg by mouth every other day.          . bimatoprost (LUMIGAN) 0.01 % SOLN   Both Eyes   Place 1 drop into both eyes at bedtime.          . brimonidine-timolol (COMBIGAN) 0.2-0.5 % ophthalmic solution   Both Eyes   Place 1 drop into both eyes 2 (two) times daily.          . calcium carbonate 200 MG capsule   Oral   Take 200 mg by mouth daily.          . citalopram (CELEXA) 10 MG tablet   Oral   Take 1 tablet (10 mg total) by mouth daily.   30 tablet   3   .  dorzolamide (TRUSOPT) 2 % ophthalmic solution   Both Eyes   Place 1 drop into both eyes 2 (two) times daily.           Marland Kitchen lidocaine (LIDODERM) 5 %   Transdermal   Place 1 patch onto the skin daily. Arthritis in shoulder         . mirabegron ER (MYRBETRIQ) 50 MG TB24   Oral   Take 1 tablet (50 mg total) by mouth daily.   30 tablet   3   . Multiple Vitamin (MULTIVITAMIN WITH MINERALS) TABS   Oral   Take 1 tablet by mouth daily.         . simvastatin (ZOCOR) 10 MG tablet   Oral   Take 10 mg by mouth at bedtime.          . traMADol (ULTRAM) 50 MG tablet      Take one tablet three times a day as needed for pain   90 tablet   5   . letrozole (FEMARA) 2.5 MG tablet   Oral   Take 2.5 mg by mouth daily.          BP 125/56  Pulse 68  Temp(Src) 97.7 F (36.5 C) (Oral)  Resp 16  SpO2 95% Physical Exam  Constitutional: She is oriented to person, place, and time. She appears well-developed and well-nourished.  HENT:  Head: Normocephalic.  L forehead small area of swelling and ecchymosis, no epistaxis, no trismus  Eyes: EOM are normal. Pupils are equal, round, and reactive to light.  Neck: Neck supple.  No c spine tenderness or deformity  Cardiovascular: Normal rate, regular rhythm and intact distal pulses.   Pulmonary/Chest: Effort normal and breath sounds normal. No respiratory distress. She exhibits no tenderness.  Abdominal: Soft. She exhibits no distension. There is no tenderness.  Musculoskeletal: Normal range of motion. She exhibits no edema and no tenderness.  Lumbar tenderness no deformity, no LE deficits  Neurological: She is alert and oriented to person, place, and time.  Skin: Skin is warm and dry.    ED Course   Procedures (including critical care time)   Date: 08/28/2012  Rate: 56  Rhythm: sinus bradycardia  QRS Axis: left  Intervals: normal  ST/T Wave abnormalities: nonspecific ST changes  Conduction Disutrbances:none  Narrative  Interpretation: Sinus with LVH unchanged from previous EKG 09/12/2011  Old EKG Reviewed: unchanged  Results for orders placed in visit on 06/27/12  COMPREHENSIVE METABOLIC PANEL      Result Value Range   Glucose 88  65 - 99 mg/dL   BUN 22  10 - 36 mg/dL   Creatinine, Ser 1.61 (*) 0.57 - 1.00 mg/dL   GFR calc non Af Amer 36 (*) >59 mL/min/1.73   GFR calc Af Amer 42 (*) >59 mL/min/1.73   BUN/Creatinine Ratio 17  11 - 26   Sodium 142  134 - 144 mmol/L   Potassium 4.5  3.5 - 5.2 mmol/L   Chloride 105  97 - 108 mmol/L   CO2 24  19 - 28 mmol/L   Calcium 9.6  8.6 - 10.2 mg/dL   Total Protein 6.6  6.0 - 8.5 g/dL   Albumin 4.1  3.2 - 4.6 g/dL   Globulin, Total 2.5  1.5 - 4.5 g/dL   Albumin/Globulin Ratio 1.6  1.1 - 2.5   Total Bilirubin 0.7  0.0 - 1.2 mg/dL   Alkaline Phosphatase 71  39 - 117 IU/L   AST 19  0 - 40 IU/L   ALT 14  0 - 32 IU/L  LIPID PANEL      Result Value Range   Cholesterol, Total 170  100 - 199 mg/dL   Triglycerides 83  0 - 149 mg/dL   HDL 62  >09 mg/dL   VLDL Cholesterol Cal 17  5 - 40 mg/dL   LDL Calculated 91  0 - 99 mg/dL   Chol/HDL Ratio 2.7  0.0 - 4.4 ratio units   Dg Lumbar Spine Complete  08/28/2012   *RADIOLOGY REPORT*  Clinical Data: Fall with low back pain.  LUMBAR SPINE - COMPLETE 4+ VIEW  Comparison: 09/14/2011.  Findings: No evidence of acute fracture or subluxation.  The appearance of the lumbar spine is similar to 09/12/2011.  Diffuse degenerative disc narrowing and endplate spurring. There is lower lumbar facet osteoarthritis with sclerosis and bony overgrowth.  Imaged portions of the sacrum and pelvic ring are intact.  No spondylolysis.  Extensive aortic and iliac atherosclerosis.  IMPRESSION: No evidence of acute fracture or subluxation.   Original Report Authenticated By: Tiburcio Pea   Ct Head Wo Contrast  08/28/2012   *RADIOLOGY REPORT*  Clinical Data: Fall.  Head trauma.  CT HEAD WITHOUT CONTRAST  Technique:  Contiguous axial images were  obtained from the base of the skull through the vertex without contrast.  Comparison: 09/12/2011.  Findings: No mass lesion, mass effect, midline shift, hydrocephalus, hemorrhage.  No acute territorial cortical ischemia/infarct. Atrophy and chronic ischemic white matter disease is present.  The calvarium is intact.  The mastoid air cells are clear.  Old right maxillary antrectomy with mucoperiosteal thickening.  Mucous retention cyst/polyp in the left maxillary floor.  Intracranial atherosclerosis.  IMPRESSION: Atrophy and chronic ischemic white matter disease without acute intracranial abnormality.   Original Report Authenticated By: Andreas Newport, M.D.   Patient requesting Tylenol which was provided. Ice applied forehead  On recheck patient states she's feeling better and is requesting to be discharged back to her facility. Plan rest, continue Tylenol and close followup with primary care physician.  MDM  Fall with left for head contusion and low back pain. No lower any deficits.   Imaging reviewed as above - no obvious fractures or abnormalities on imaging  medication provided  Vital signs and nursing notes reviewed and considered  Sunnie Nielsen, MD 08/28/12 [phone]

## 2012-08-28 NOTE — ED Notes (Signed)
PTAR to transport 

## 2012-08-28 NOTE — ED Notes (Signed)
Pt requesting tylenol. Pt asked to be ambulated after she receives medication.

## 2012-08-28 NOTE — ED Notes (Signed)
Pt assisted to sitting position. Upon standing pt with two person assist, pt is very unsteady and unable to keep balance. Pt taken back to sitting position.

## 2012-09-05 ENCOUNTER — Encounter: Payer: Self-pay | Admitting: Nurse Practitioner

## 2012-09-05 ENCOUNTER — Ambulatory Visit (INDEPENDENT_AMBULATORY_CARE_PROVIDER_SITE_OTHER): Payer: Medicare Other | Admitting: Nurse Practitioner

## 2012-09-05 VITALS — BP 108/62 | HR 81 | Temp 97.4°F | Resp 18 | Ht 62.0 in | Wt 189.8 lb

## 2012-09-05 DIAGNOSIS — W19XXXS Unspecified fall, sequela: Secondary | ICD-10-CM

## 2012-09-05 DIAGNOSIS — F0280 Dementia in other diseases classified elsewhere without behavioral disturbance: Secondary | ICD-10-CM | POA: Diagnosis not present

## 2012-09-05 DIAGNOSIS — G309 Alzheimer's disease, unspecified: Secondary | ICD-10-CM

## 2012-09-05 NOTE — Progress Notes (Signed)
Patient ID: Michelle [name], female   DOB: Oct 23, 1920, 79 y.o.   MRN: [id number]   Allergies  Allergen Reactions  . Codeine     States she blacks out    Chief Complaint  Patient presents with  . Hospitalization Follow-up    fell from balance issues,pain from lower back to right hip     HPI: Patient is a 79 y.o. female seen in the office today for follow up fall  She currently has aid from 8-12 and 4-8. After aid left pt was getting ready to go to bed and she fell. Reported she lost her balance falling onto her back, Pt hit head and back. Now has right flank pain  When to the ED and the workup was negative for contusions or fx. Pt reports pain in the back but pain improves with tramadol and tylenol. Bruising has improving.  Pt takes care of her own medication.  [facility] is the agency that Michelle [name] works with and they have sent a nurse out twice to evaluate her after the fall.  Pt with memory loss and frequent falls. It has been recommended for more care  Pt has had 2-3 falls within the last month. Not able to count the amount of times over the past year.  pts daughter is in a different country and unable to be reached Pt reports she can not afford additional care.  Review of Systems:  Review of Systems  Constitutional: Negative for malaise/fatigue.  Eyes:       Wears corrective lens  Respiratory: Negative for cough and shortness of breath.   Cardiovascular: Negative for chest pain and palpitations.  Gastrointestinal: Negative for abdominal pain, diarrhea and constipation.  Genitourinary: Negative for dysuria and urgency. Frequency: improved on myrbetriq.  Musculoskeletal: Positive for myalgias, back pain and falls.  Neurological: Positive for weakness. Negative for dizziness and headaches.  Psychiatric/Behavioral: Positive for memory loss.     Past Medical History  Diagnosis Date  . Hypertension   . Hyperlipidemia LDL goal < 100   . Polymyalgia rheumatica    . GERD (gastroesophageal reflux disease)   . Irritable bowel syndrome   . Diverticulitis   . Sciatica   . Vertigo   . Edema of leg   . Glaucoma   . Appendicitis   . Arthritis   . Acute upper respiratory infections of unspecified site   . Urge incontinence   . Other specified symptom associated with female genital organs   . Regional enteritis of unspecified site   . Sebaceous cyst   . Depression   . Other malaise and fatigue   . Abnormal weight gain   . Leukocytopenia, unspecified   . Dementia in conditions classified elsewhere without behavioral disturbance(294.10)   . Anemia, unspecified   . Chronic kidney disease, stage III (moderate)   . Unspecified arthropathy, shoulder region   . Other B-complex deficiencies   . Unspecified vitamin D deficiency   . Memory loss   . Abnormality of gait   . Personal history of fall   . Malignant neoplasm of breast (female), unspecified site   . Unspecified glaucoma(365.9)   . Diverticulosis of colon (without mention of hemorrhage)   . Irritable bowel syndrome   . Sciatica   . Dizziness and giddiness    Past Surgical History  Procedure Laterality Date  . Nasal sinus surgery  1966  . Cholecystectomy  1972  . Cataract extraction  1990  . Breast surgery  R breast lumpectomy for benign disease  . Breast lumpectomy  10/28/10    left breast    Social History:   reports that she has never smoked. She has never used smokeless tobacco. She reports that she does not drink alcohol or use illicit drugs.  Family History  Problem Relation Age of Onset  . Stroke Father   . Hypertension Mother   . Stroke Mother   . Breast cancer Mother   . Heart attack Brother   . Cancer Brother   . Stroke Brother   . Heart attack Son   . Colon cancer Sister     Medications: Patient's Medications  New Prescriptions   No medications on file  Previous Medications   AMLODIPINE (NORVASC) 5 MG TABLET    Take 1 tablet (5 mg total) by mouth daily.    ASPIRIN 81 MG CHEWABLE TABLET    Chew 81 mg by mouth every other day.    BIMATOPROST (LUMIGAN) 0.01 % SOLN    Place 1 drop into both eyes at bedtime.    BRIMONIDINE-TIMOLOL (COMBIGAN) 0.2-0.5 % OPHTHALMIC SOLUTION    Place 1 drop into both eyes 2 (two) times daily.    CALCIUM CARBONATE 200 MG CAPSULE    Take 200 mg by mouth daily.    CITALOPRAM (CELEXA) 10 MG TABLET    Take 1 tablet (10 mg total) by mouth daily.   DORZOLAMIDE (TRUSOPT) 2 % OPHTHALMIC SOLUTION    Place 1 drop into both eyes 2 (two) times daily.     LETROZOLE (FEMARA) 2.5 MG TABLET    Take 2.5 mg by mouth daily.   LIDOCAINE (LIDODERM) 5 %    Place 1 patch onto the skin daily. Arthritis in shoulder   MIRABEGRON ER (MYRBETRIQ) 50 MG TB24    Take 1 tablet (50 mg total) by mouth daily.   MULTIPLE VITAMIN (MULTIVITAMIN WITH MINERALS) TABS    Take 1 tablet by mouth daily.   SIMVASTATIN (ZOCOR) 10 MG TABLET    Take 10 mg by mouth at bedtime.    TRAMADOL (ULTRAM) 50 MG TABLET    Take one tablet three times a day as needed for pain  Modified Medications   No medications on file  Discontinued Medications   No medications on file     Physical Exam:  Filed Vitals:   09/05/12 1046  BP: 108/62  Pulse: 81  Temp: 97.4 F (36.3 C)  TempSrc: Oral  Resp: 18  Height: 5\' 2"  (1.575 m)  Weight: 189 lb 12.8 oz (86.093 kg)  SpO2: 95%    Physical Exam  Vitals reviewed. Constitutional: She is oriented to person, place, and time and well-developed, well-nourished, and in no distress. No distress.  Cardiovascular: Normal rate, regular rhythm and normal heart sounds.   Pulmonary/Chest: Effort normal and breath sounds normal.  Abdominal: Soft. Bowel sounds are normal.  Musculoskeletal: Normal range of motion. She exhibits tenderness (to right flank/back area- no tenderness over spine). She exhibits no edema.  Neurological: She is alert and oriented to person, place, and time.  Skin: Skin is warm and dry. She is not diaphoretic.  Small  amount of ecchymosis to right flank     Labs reviewed: Basic Metabolic Panel:  Recent Labs  16/10/96 1057 01/04/12 1144 06/27/12 1028  NA 142 141 142  K 4.4 4.4 4.5  CL 107 109* 105  CO2 28 25 24   GLUCOSE 84 88 88  BUN 27* 29.0* 22  CREATININE 1.17* 1.3* 1.30*  CALCIUM 9.7 9.2 9.6   Liver Function Tests:  Recent Labs  01/04/12 1144 06/27/12 1028  AST 17 19  ALT 13 14  ALKPHOS 67 71  BILITOT 0.63 0.7  PROT 6.5 6.6  ALBUMIN 3.3*  --    No results found for this basename: LIPASE, AMYLASE,  in the last 8760 hours No results found for this basename: AMMONIA,  in the last 8760 hours CBC:  Recent Labs  09/12/11 1057 01/04/12 1144  WBC 3.7* 3.2*  NEUTROABS 2.2 1.7  HGB 11.3* 10.3*  HCT 35.5* 32.9*  MCV 94.4 94.5  PLT 152 160   Lipid Panel:  Recent Labs  06/27/12 1028  HDL 62  LDLCALC 91  TRIG 83  CHOLHDL 2.7    Assessment/Plan 1. Fall, sequela multiple falls will consult  Ambulatory referral to Home Health  2. Alzheimer's dementia Increased memory loss todays MMSE has actually improved from previous exams however pt does have ongoing memory loss with multiple falls will consult [facility] to make sure pt has proper care and to evaluate need for additional help - Ambulatory referral to Home Health

## 2012-09-05 NOTE — Patient Instructions (Signed)
recommenced to get additional care or look into assisted living  Will get home heath nrusing and SW to come out to house to evaluate

## 2012-09-12 DIAGNOSIS — M79609 Pain in unspecified limb: Secondary | ICD-10-CM | POA: Diagnosis not present

## 2012-09-12 DIAGNOSIS — Q828 Other specified congenital malformations of skin: Secondary | ICD-10-CM | POA: Diagnosis not present

## 2012-09-12 DIAGNOSIS — B351 Tinea unguium: Secondary | ICD-10-CM | POA: Diagnosis not present

## 2012-09-18 ENCOUNTER — Ambulatory Visit
Admission: RE | Admit: 2012-09-18 | Discharge: 2012-09-18 | Disposition: A | Discharge status: Home / Self Care | Payer: Medicare Other | Source: Ambulatory Visit | Attending: Internal Medicine | Admitting: Internal Medicine

## 2012-09-18 DIAGNOSIS — Z853 Personal history of malignant neoplasm of breast: Secondary | ICD-10-CM | POA: Diagnosis not present

## 2012-09-20 ENCOUNTER — Encounter: Payer: Self-pay | Admitting: Geriatric Medicine

## 2012-09-27 ENCOUNTER — Other Ambulatory Visit: Payer: Self-pay | Admitting: *Deleted

## 2012-09-27 MED ORDER — LIDOCAINE 5 % EX PTCH: MEDICATED_PATCH | CUTANEOUS | Status: DC

## 2012-10-09 ENCOUNTER — Encounter (INDEPENDENT_AMBULATORY_CARE_PROVIDER_SITE_OTHER): Payer: Self-pay | Admitting: General Surgery

## 2012-10-28 ENCOUNTER — Ambulatory Visit (INDEPENDENT_AMBULATORY_CARE_PROVIDER_SITE_OTHER): Payer: Medicare Other | Admitting: Internal Medicine

## 2012-10-28 ENCOUNTER — Encounter: Payer: Self-pay | Admitting: Internal Medicine

## 2012-10-28 VITALS — BP 134/78 | HR 56 | Temp 97.7°F | Wt 186.0 lb

## 2012-10-28 DIAGNOSIS — R1011 Right upper quadrant pain: Secondary | ICD-10-CM | POA: Diagnosis not present

## 2012-10-28 DIAGNOSIS — R197 Diarrhea, unspecified: Secondary | ICD-10-CM

## 2012-10-28 NOTE — Progress Notes (Signed)
Patient ID: Michelle [name], female   DOB: Nov 09, 1920, 79 y.o.   MRN: [id number] Location:  [facility]   Allergies  Allergen Reactions  . Codeine     States she blacks out    Chief Complaint  Patient presents with  . Diarrhea    off/on x 2 weeks, last episode this am   . Flank Pain    right side x 1 week, no known injury     HPI: Patient is a 79 y.o.  female seen in the office today for diarrhea and right side pain--lives at [facility] .   For the last 3 weeks, they (home care) thought she had the stomach flu, so they began to push fluids, the diarrhea improved at first. Then returned. Now patient is having right side pain and a small amount of diarrhea this morning. Pt. Has not taken any medications. Caregiver states that the right side is tender to palpation. Pt. Denies having a fall and trauma to the right side. Caregiver states that she has not had much of an appetite. Pt. States she notices the pain more after she eats. States there are certain things she can no longer such as pork. Pt. States she had her gall bladder removed several years back.   Review of Systems:  Review of Systems  Constitutional: Positive for malaise/fatigue. Negative for fever and weight loss.  Gastrointestinal: Positive for heartburn, abdominal pain and diarrhea. Negative for vomiting.       Bitter taste in mouth  flautus   Genitourinary: Positive for urgency.       Overactive bladder- Medication was changed and it has improved urgency  Musculoskeletal: Negative for falls.     Past Medical History  Diagnosis Date  . Hypertension   . Hyperlipidemia LDL goal < 100   . Polymyalgia rheumatica   . GERD (gastroesophageal reflux disease)   . Irritable bowel syndrome   . Diverticulitis   . Sciatica   . Vertigo   . Edema of leg   . Glaucoma   . Appendicitis   . Arthritis   . Acute upper respiratory infections of unspecified site   . Urge incontinence    . Other specified symptom associated with female genital organs   . Regional enteritis of unspecified site   . Sebaceous cyst   . Depression   . Other malaise and fatigue   . Abnormal weight gain   . Leukocytopenia, unspecified   . Dementia in conditions classified elsewhere without behavioral disturbance(294.10)   . Anemia, unspecified   . Chronic kidney disease, stage III (moderate)   . Unspecified arthropathy, shoulder region   . Other B-complex deficiencies   . Unspecified vitamin D deficiency   . Memory loss   . Abnormality of gait   . Personal history of fall   . Malignant neoplasm of breast (female), unspecified site   . Unspecified glaucoma(365.9)   . Diverticulosis of colon (without mention of hemorrhage)   . Irritable bowel syndrome   . Sciatica   . Dizziness and giddiness     Past Surgical History  Procedure Laterality Date  . Nasal sinus surgery  1966  . Cholecystectomy  1972  . Cataract extraction  1990  . Breast surgery      R breast lumpectomy for benign disease  . Breast lumpectomy  10/28/10    left breast     Social History:   reports that she has never smoked. She has  never used smokeless tobacco. She reports that she does not drink alcohol or use illicit drugs.  Family History  Problem Relation Age of Onset  . Stroke Father   . Hypertension Mother   . Stroke Mother   . Breast cancer Mother   . Heart attack Brother   . Cancer Brother   . Stroke Brother   . Heart attack Son   . Colon cancer Sister     Medications: Patient's Medications  New Prescriptions   No medications on file  Previous Medications   AMLODIPINE (NORVASC) 5 MG TABLET    Take 1 tablet (5 mg total) by mouth daily.   ASPIRIN 81 MG CHEWABLE TABLET    Chew 81 mg by mouth every other day.    BIMATOPROST (LUMIGAN) 0.01 % SOLN    Place 1 drop into both eyes at bedtime.    BRIMONIDINE-TIMOLOL (COMBIGAN) 0.2-0.5 % OPHTHALMIC SOLUTION    Place 1 drop into both eyes 2 (two) times  daily.    CALCIUM CARBONATE 200 MG CAPSULE    Take 200 mg by mouth daily.    CITALOPRAM (CELEXA) 10 MG TABLET    Take 1 tablet (10 mg total) by mouth daily.   DORZOLAMIDE (TRUSOPT) 2 % OPHTHALMIC SOLUTION    Place 1 drop into both eyes 2 (two) times daily.     LIDOCAINE (LIDODERM) 5 %    Apply one patch to each shoulder, Keep on 12 hours, Then keep off for 12 hours   MIRABEGRON ER (MYRBETRIQ) 50 MG TB24    Take 1 tablet (50 mg total) by mouth daily.   MULTIPLE VITAMIN (MULTIVITAMIN WITH MINERALS) TABS    Take 1 tablet by mouth daily.   SIMVASTATIN (ZOCOR) 10 MG TABLET    Take 10 mg by mouth at bedtime.    TRAMADOL (ULTRAM) 50 MG TABLET    Take one tablet three times a day as needed for pain  Modified Medications   No medications on file  Discontinued Medications   LETROZOLE (FEMARA) 2.5 MG TABLET    Take 2.5 mg by mouth daily.     Physical Exam: Filed Vitals:   10/28/12 1512  BP: 134/78  Pulse: 56  Temp: 97.7 F (36.5 C)  TempSrc: Oral  Weight: 186 lb (84.369 kg)  SpO2: 95%   Physical Exam  Constitutional: She appears well-developed and well-nourished.  Cardiovascular: Normal rate, regular rhythm, normal heart sounds and intact distal pulses.   Pulmonary/Chest: Effort normal and breath sounds normal.  Abdominal: Soft. Bowel sounds are normal. There is tenderness.  RUQ  Musculoskeletal:  Walking much slower today and more difficulty sitting up on her own  Neurological: She is alert.  Skin: Skin is dry.  Psychiatric: She has a normal mood and affect.     Labs reviewed: Basic Metabolic Panel:  Recent Labs  24/40/10 1144 06/27/12 1028  NA 141 142  K 4.4 4.5  CL 109* 105  CO2 25 24  GLUCOSE 88 88  BUN 29.0* 22  CREATININE 1.3* 1.30*  CALCIUM 9.2 9.6   Liver Function Tests:  Recent Labs  01/04/12 1144 06/27/12 1028  AST 17 19  ALT 13 14  ALKPHOS 67 71  BILITOT 0.63 0.7  PROT 6.5 6.6  ALBUMIN 3.3*  --     CBC:  Recent Labs  01/04/12 1144  WBC 3.2*   NEUTROABS 1.7  HGB 10.3*  HCT 32.9*  MCV 94.5  PLT 160   Lipid Panel:  Recent Labs  06/27/12 1028  HDL 62  LDLCALC 91  TRIG 83  CHOLHDL 2.7   Assessment/Plan 1. Abdominal pain, right upper quadrant - CBC with Differential - Lipase - Amylase - Hepatic Function Panel -etiology unclear at this time w/o fever and intermittent nature 2. Diarrhea -push po fluids and cont to monitor, is weaker than usual during exam (walking much more slowly) -will order imaging if it does not get better and depending on results  Labs/tests ordered:  Cbc, amylase, lipase, hepatic function Next appt:  As scheduled

## 2012-10-28 NOTE — Patient Instructions (Signed)
Push fluids Will check labs to make sure liver, pancreas are ok and blood counts

## 2012-10-29 LAB — HEPATIC FUNCTION PANEL
ALT: 12 IU/L (ref 0–32)
AST: 20 IU/L (ref 0–40)
Albumin: 4 g/dL (ref 3.2–4.6)
Alkaline Phosphatase: 82 IU/L (ref 39–117)
Bilirubin, Direct: 0.16 mg/dL (ref 0.00–0.40)
Total Bilirubin: 0.6 mg/dL (ref 0.0–1.2)
Total Protein: 6.6 g/dL (ref 6.0–8.5)

## 2012-10-29 LAB — CBC WITH DIFFERENTIAL/PLATELET
Basophils Absolute: 0 10*3/uL (ref 0.0–0.2)
Basos: 0 %
Eos: 3 %
Eosinophils Absolute: 0.1 10*3/uL (ref 0.0–0.4)
HCT: 34.7 % (ref 34.0–46.6)
Hemoglobin: 11.2 g/dL (ref 11.1–15.9)
Immature Grans (Abs): 0 10*3/uL (ref 0.0–0.1)
Immature Granulocytes: 0 %
Lymphocytes Absolute: 1.2 10*3/uL (ref 0.7–3.1)
Lymphs: 34 %
MCH: 30.2 pg (ref 26.6–33.0)
MCHC: 32.3 g/dL (ref 31.5–35.7)
MCV: 94 fL (ref 79–97)
Monocytes Absolute: 0.5 10*3/uL (ref 0.1–0.9)
Monocytes: 14 %
Neutrophils Absolute: 1.7 10*3/uL (ref 1.4–7.0)
Neutrophils Relative %: 49 %
RBC: 3.71 x10E6/uL — ABNORMAL LOW (ref 3.77–5.28)
RDW: 13.2 % (ref 12.3–15.4)
WBC: 3.4 10*3/uL (ref 3.4–10.8)

## 2012-10-29 LAB — LIPASE: Lipase: 13 U/L (ref 0–59)

## 2012-10-29 LAB — AMYLASE: Amylase: 33 U/L (ref 31–124)

## 2012-11-07 ENCOUNTER — Telehealth: Payer: Self-pay | Admitting: *Deleted

## 2012-11-07 ENCOUNTER — Ambulatory Visit (HOSPITAL_BASED_OUTPATIENT_CLINIC_OR_DEPARTMENT_OTHER): Payer: Medicare Other | Admitting: Oncology

## 2012-11-07 VITALS — BP 139/81 | HR 60 | Temp 97.7°F | Resp 19 | Ht 62.0 in | Wt 189.3 lb

## 2012-11-07 DIAGNOSIS — D0512 Intraductal carcinoma in situ of left breast: Secondary | ICD-10-CM

## 2012-11-07 DIAGNOSIS — D0590 Unspecified type of carcinoma in situ of unspecified breast: Secondary | ICD-10-CM

## 2012-11-07 DIAGNOSIS — Z170 Estrogen receptor positive status [ER+]: Secondary | ICD-10-CM | POA: Diagnosis not present

## 2012-11-07 DIAGNOSIS — Z23 Encounter for immunization: Secondary | ICD-10-CM

## 2012-11-07 DIAGNOSIS — K5900 Constipation, unspecified: Secondary | ICD-10-CM

## 2012-11-07 DIAGNOSIS — K219 Gastro-esophageal reflux disease without esophagitis: Secondary | ICD-10-CM

## 2012-11-07 DIAGNOSIS — C50919 Malignant neoplasm of unspecified site of unspecified female breast: Secondary | ICD-10-CM

## 2012-11-07 MED ORDER — OMEPRAZOLE 20 MG PO CPDR: 20.0000 mg | DELAYED_RELEASE_CAPSULE | Freq: Every day | ORAL | Status: DC

## 2012-11-07 MED ORDER — INFLUENZA VAC SPLIT QUAD 0.5 ML IM SUSP
0.5000 mL | Freq: Once | INTRAMUSCULAR | Status: AC
  Administered 2012-11-07: 0.5 mL via INTRAMUSCULAR
  Filled 2012-11-07: qty 0.5

## 2012-11-07 MED ORDER — POLYETHYLENE GLYCOL 3350 17 G PO PACK: 17.0000 g | PACK | Freq: Every day | ORAL | Status: DC

## 2012-11-07 NOTE — Progress Notes (Signed)
ID: Michelle [name]   DOB: [birth year]  MR#: [id number]  WUJ#:[id number]  PCP: Michelle Spikes, DO GYN:  SU: Almond Lint OTHER MD: Dorothy Puffer   HISTORY OF PRESENT ILLNESS: Ms. [name] had routine screening mammography September 16, 2010.  There were new microcalcifications noted in the left breast and she was brought back for additional views on September 27, 2010.  Bilateral diagnostic mammography on that day showed a cluster of calcifications in the lower inner quadrant of the left breast measuring up to 2.9 cm.  There was a faint parenchymal density associated with these calcifications.  Accordingly, the patient proceeded to stereotactically guided vacuum-assisted biopsy of the left breast October 04, 2010.  The pathology from that procedure ([id number]) showed a ductal carcinoma in situ.  This appeared to be intermediate to high-grade and it was strongly estrogen receptor positive at 100%, weakly progesterone receptor positive at 6%. Her subsequent history is as detailed below  INTERVAL HISTORY: Michelle [name] returns today with her caregiverfor followup of the patient's breast cancer. The interval history is unremarkable. "They do a lot for me", so during the day she reads, prays, and listens to CDs.  REVIEW OF SYSTEMS: When asked whether worse problem is she points straight to the middle of her epigastrium and says it hurts right here. This is not accompanied by nausea or vomiting. When she eats she has a lot of gas. Her stool is very hard. He took her 1 hour to have a bowel movement today and "it was like a break". She describes herself is fatigued. She has some pain in her shoulders and hips. This is not more intense or persistent than before. She does not hear very well. She has a bit of a running nose and some sinus symptoms secondary to the change in weather. Her ankles swell chronically. She has urinary stress incontinence. No these symptoms are new or more worrisome than before. A detailed review of  systems was otherwise stable.  PAST MEDICAL HISTORY: Past Medical History  Diagnosis Date  . Hypertension   . Hyperlipidemia LDL goal < 100   . Polymyalgia rheumatica   . GERD (gastroesophageal reflux disease)   . Irritable bowel syndrome   . Diverticulitis   . Sciatica   . Vertigo   . Edema of leg   . Glaucoma   . Appendicitis   . Arthritis   . Acute upper respiratory infections of unspecified site   . Urge incontinence   . Other specified symptom associated with female genital organs   . Regional enteritis of unspecified site   . Sebaceous cyst   . Depression   . Other malaise and fatigue   . Abnormal weight gain   . Leukocytopenia, unspecified   . Dementia in conditions classified elsewhere without behavioral disturbance(294.10)   . Anemia, unspecified   . Chronic kidney disease, stage III (moderate)   . Unspecified arthropathy, shoulder region   . Other B-complex deficiencies   . Unspecified vitamin D deficiency   . Memory loss   . Abnormality of gait   . Personal history of fall   . Malignant neoplasm of breast (female), unspecified site   . Unspecified glaucoma(365.9)   . Diverticulosis of colon (without mention of hemorrhage)   . Irritable bowel syndrome   . Sciatica   . Dizziness and giddiness   The patient has an extensive medical history including hypertension, hyperlipidemia, polymyalgia rheumatica, GERD, irritable bowel syndrome, diverticulosis, sciatica, hyperactive bladder, vertigo, phlebitis, glaucoma and remote appendicitis.  She has undergone sinus surgery, cataract surgery and is status post cholecystectomy.  PAST SURGICAL HISTORY: Past Surgical History  Procedure Laterality Date  . Nasal sinus surgery  1966  . Cholecystectomy  1972  . Cataract extraction  1990  . Breast surgery      R breast lumpectomy for benign disease  . Breast lumpectomy  10/28/10    left breast     FAMILY HISTORY Family History  Problem Relation Age of Onset  . Stroke  Father   . Hypertension Mother   . Stroke Mother   . Breast cancer Mother   . Heart attack Brother   . Cancer Brother   . Stroke Brother   . Heart attack Son   . Colon cancer Sister   The patient's father died from a stroke at the age of 82.  The patient's mother died at the age of 38 from breast cancer.  The patient had a brother who died at age 24 from a stroke and a sister who died at age 20 from colon cancer.  Another brother had Hodgkin's disease.  There were altogether 5 brothers and 5 sisters.  GYNECOLOGIC HISTORY: She had menarche age 76.  She is GX, P1, age at first live birth of 68.  She never took hormone replacement.  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   Her daughter, Darrold Span, present today, is Insurance risk surveyor at [employer] for [employer]  The patient's husband, Michelle [name], died many years ago.  Michelle [name] lives in a retirement home and, aside from breakfast, she does not cook any meals, no longer drives and mostly stays around the retirement home.  She does do some rehab exercises there. She at tends [employer] the [employer]   ADVANCED DIRECTIVES:  HEALTH MAINTENANCE: History  Substance Use Topics  . Smoking status: Never Smoker   . Smokeless tobacco: Never Used  . Alcohol Use: No     Colonoscopy:  PAP:  Bone density:  Lipid panel:  Allergies  Allergen Reactions  . Codeine     States she blacks out    Current Outpatient Prescriptions  Medication Sig Dispense Refill  . amLODipine (NORVASC) 5 MG tablet Take 1 tablet (5 mg total) by mouth daily.  30 tablet  3  . aspirin 81 MG chewable tablet Chew 81 mg by mouth every other day.       . bimatoprost (LUMIGAN) 0.01 % SOLN Place 1 drop into both eyes at bedtime.       . brimonidine-timolol (COMBIGAN) 0.2-0.5 % ophthalmic solution Place 1 drop into both eyes 2 (two) times daily.       . calcium carbonate 200 MG capsule Take 200  mg by mouth daily.       . citalopram (CELEXA) 10 MG tablet Take 1 tablet (10 mg total) by mouth daily.  30 tablet  3  . dorzolamide (TRUSOPT) 2 % ophthalmic solution Place 1 drop into both eyes 2 (two) times daily.        Marland Kitchen lidocaine (LIDODERM) 5 % Apply one patch to each shoulder, Keep on 12 hours, Then keep off for 12 hours  60 patch  5  . mirabegron ER (MYRBETRIQ) 50 MG TB24 Take 1 tablet (50 mg total) by mouth daily.  30 tablet  3  . Multiple Vitamin (MULTIVITAMIN WITH MINERALS) TABS Take 1 tablet by mouth daily.      Marland Kitchen  simvastatin (ZOCOR) 10 MG tablet Take 10 mg by mouth at bedtime.       . traMADol (ULTRAM) 50 MG tablet Take one tablet three times a day as needed for pain  90 tablet  5   No current facility-administered medications for this visit.    OBJECTIVE: Elderly African American woman examined in a wheelchair Filed Vitals:   11/07/12 1148  BP: 139/81  Pulse: 60  Temp: 97.7 F (36.5 C)  Resp: 19     Body mass index is 34.61 kg/(m^2).    ECOG FS: 2  Sclerae unicteric, extraocular movements intact Oropharynx no thrush or lesions No cervical or supraclavicular adenopathy Lungs no rales or rhonchi Heart regular rate and rhythm Abd soft, nontender, positive bowel sounds MSK no focal spinal tenderness, no upper extremity lymphedema Neuro: nonfocal, well oriented, positive affect ("I am happy person".) Breasts: The right breast is unremarkable. The left breast is status post lumpectomy. There is no evidence of local recurrence. The left axilla is benign Skin: She wanted me to see a "spot" in her lower abdomen. This is a seborrheic keratosis. I reassured her that it was benign  LAB RESULTS: Lab Results  Component Value Date   WBC 3.4 10/28/2012   NEUTROABS 1.7 10/28/2012   HGB 11.2 10/28/2012   HCT 34.7 10/28/2012   MCV 94 10/28/2012   PLT 160 01/04/2012      Chemistry      Component Value Date/Time   NA 142 06/27/2012 1028   NA 141 01/04/2012 1144   NA 142 09/12/2011  1057   K 4.5 06/27/2012 1028   K 4.4 01/04/2012 1144   CL 105 06/27/2012 1028   CL 109* 01/04/2012 1144   CO2 24 06/27/2012 1028   CO2 25 01/04/2012 1144   BUN 22 06/27/2012 1028   BUN 29.0* 01/04/2012 1144   BUN 27* 09/12/2011 1057   CREATININE 1.30* 06/27/2012 1028   CREATININE 1.3* 01/04/2012 1144      Component Value Date/Time   CALCIUM 9.6 06/27/2012 1028   CALCIUM 9.2 01/04/2012 1144   ALKPHOS 82 10/28/2012 1602   ALKPHOS 67 01/04/2012 1144   AST 20 10/28/2012 1602   AST 17 01/04/2012 1144   ALT 12 10/28/2012 1602   ALT 13 01/04/2012 1144   BILITOT 0.6 10/28/2012 1602   BILITOT 0.63 01/04/2012 1144       No results found for this basename: LABCA2    No components found with this basename: LABCA125    No results found for this basename: INR,  in the last 168 hours  Urinalysis    Component Value Date/Time   COLORURINE YELLOW 09/12/2011 1210   APPEARANCEUR CLEAR 09/12/2011 1210   LABSPEC 1.018 09/12/2011 1210   PHURINE 6.5 09/12/2011 1210   GLUCOSEU NEGATIVE 09/12/2011 1210   HGBUR SMALL* 09/12/2011 1210   BILIRUBINUR NEGATIVE 09/12/2011 1210   KETONESUR NEGATIVE 09/12/2011 1210   PROTEINUR NEGATIVE 09/12/2011 1210   UROBILINOGEN 0.2 09/12/2011 1210   NITRITE POSITIVE* 09/12/2011 1210   LEUKOCYTESUR SMALL* 09/12/2011 1210    STUDIES: Mammography 09/18/2012 was unremarkable   ASSESSMENT: 79 y.o. old [place] woman status post left lumpectomy October 28, 2010 for a 1.8 cm ductal carcinoma in situ, high-grade, estrogen and progesterone receptor positive at 100% and 6%, with negative margins. She did not receive radiation, but took letrozole for one year, stopping October 2013  PLAN:  She is doing fine from a breast cancer point of view, and of course has an  excellent prognosis. I think she is having significant issues with reflux and I wrote her for omeprazole 20 mg  to take every evening. She is also having significant constipation and very hard bowel movements. I have started her on  MiraLAX daily. If that proves to be too much she will take it every other day.  Otherwise she will return to see [place] again in one year. She knows to call for any problems that may develop before her next visit here.   Edem Tiegs C    11/07/2012

## 2012-11-07 NOTE — Telephone Encounter (Signed)
appts made and printed...td 

## 2012-11-08 ENCOUNTER — Other Ambulatory Visit: Payer: Self-pay | Admitting: *Deleted

## 2012-11-08 MED ORDER — AMLODIPINE BESYLATE 5 MG PO TABS: 5.0000 mg | ORAL_TABLET | Freq: Every day | ORAL | Status: DC

## 2012-11-12 ENCOUNTER — Telehealth: Payer: Self-pay | Admitting: *Deleted

## 2012-11-12 NOTE — Telephone Encounter (Signed)
Lm gv appt for dermatologist for 03/24/13 @ 2pm. i also gv info for the the address and phone...td

## 2012-11-14 ENCOUNTER — Ambulatory Visit: Payer: Medicare Other | Admitting: Internal Medicine

## 2012-11-22 ENCOUNTER — Other Ambulatory Visit: Payer: Self-pay | Admitting: *Deleted

## 2012-11-22 MED ORDER — CITALOPRAM HYDROBROMIDE 10 MG PO TABS: 10.0000 mg | ORAL_TABLET | Freq: Every day | ORAL | Status: DC

## 2012-11-25 DIAGNOSIS — Z9181 History of falling: Secondary | ICD-10-CM

## 2012-11-25 DIAGNOSIS — M6281 Muscle weakness (generalized): Secondary | ICD-10-CM

## 2012-11-25 DIAGNOSIS — I10 Essential (primary) hypertension: Secondary | ICD-10-CM | POA: Diagnosis not present

## 2012-11-25 DIAGNOSIS — R262 Difficulty in walking, not elsewhere classified: Secondary | ICD-10-CM | POA: Diagnosis not present

## 2012-11-25 DIAGNOSIS — M353 Polymyalgia rheumatica: Secondary | ICD-10-CM

## 2012-11-25 DIAGNOSIS — F0390 Unspecified dementia without behavioral disturbance: Secondary | ICD-10-CM | POA: Diagnosis not present

## 2012-11-25 DIAGNOSIS — F329 Major depressive disorder, single episode, unspecified: Secondary | ICD-10-CM | POA: Diagnosis not present

## 2012-11-25 DIAGNOSIS — Z5189 Encounter for other specified aftercare: Secondary | ICD-10-CM

## 2012-11-27 DIAGNOSIS — F0390 Unspecified dementia without behavioral disturbance: Secondary | ICD-10-CM | POA: Diagnosis not present

## 2012-11-27 DIAGNOSIS — Z9181 History of falling: Secondary | ICD-10-CM | POA: Diagnosis not present

## 2012-11-27 DIAGNOSIS — R262 Difficulty in walking, not elsewhere classified: Secondary | ICD-10-CM | POA: Diagnosis not present

## 2012-11-27 DIAGNOSIS — M353 Polymyalgia rheumatica: Secondary | ICD-10-CM | POA: Diagnosis not present

## 2012-11-27 DIAGNOSIS — Z5189 Encounter for other specified aftercare: Secondary | ICD-10-CM | POA: Diagnosis not present

## 2012-11-27 DIAGNOSIS — M6281 Muscle weakness (generalized): Secondary | ICD-10-CM | POA: Diagnosis not present

## 2012-11-28 ENCOUNTER — Ambulatory Visit (INDEPENDENT_AMBULATORY_CARE_PROVIDER_SITE_OTHER): Payer: Medicare Other | Admitting: Internal Medicine

## 2012-11-28 ENCOUNTER — Encounter: Payer: Self-pay | Admitting: Internal Medicine

## 2012-11-28 VITALS — BP 122/74 | HR 65 | Temp 97.6°F | Wt 188.6 lb

## 2012-11-28 DIAGNOSIS — R197 Diarrhea, unspecified: Secondary | ICD-10-CM | POA: Diagnosis not present

## 2012-11-28 DIAGNOSIS — N3941 Urge incontinence: Secondary | ICD-10-CM

## 2012-11-28 DIAGNOSIS — M159 Polyosteoarthritis, unspecified: Secondary | ICD-10-CM | POA: Diagnosis not present

## 2012-11-28 DIAGNOSIS — I10 Essential (primary) hypertension: Secondary | ICD-10-CM

## 2012-11-28 DIAGNOSIS — M353 Polymyalgia rheumatica: Secondary | ICD-10-CM | POA: Diagnosis not present

## 2012-11-28 DIAGNOSIS — F0390 Unspecified dementia without behavioral disturbance: Secondary | ICD-10-CM | POA: Diagnosis not present

## 2012-11-28 DIAGNOSIS — Z9181 History of falling: Secondary | ICD-10-CM | POA: Diagnosis not present

## 2012-11-28 DIAGNOSIS — R262 Difficulty in walking, not elsewhere classified: Secondary | ICD-10-CM | POA: Diagnosis not present

## 2012-11-28 DIAGNOSIS — E785 Hyperlipidemia, unspecified: Secondary | ICD-10-CM | POA: Diagnosis not present

## 2012-11-28 DIAGNOSIS — Z5189 Encounter for other specified aftercare: Secondary | ICD-10-CM | POA: Diagnosis not present

## 2012-11-28 DIAGNOSIS — M6281 Muscle weakness (generalized): Secondary | ICD-10-CM | POA: Diagnosis not present

## 2012-11-28 NOTE — Progress Notes (Signed)
Patient ID: Michelle [name], female   DOB: Mar 28, 1920, 79 y.o.   MRN: [id number] Location:  [facility]  Code Status: DNR   Allergies  Allergen Reactions  . Codeine     States she blacks out    Chief Complaint  Patient presents with  . Medical Managment of Chronic Issues    4 month f/u   . Immunizations    HPI: Patient is a 79 y.o. female seen in the office today for medical management of chronic issues.  Pt. States that she is not feeling good. Pt states that she has pain all overall, in her arms, legs and back. Started hurting about two weeks ago and its getting worse. States that tylenol helps as well as the tramadol. she takes this in the evenings because her pain is worse then. Denies having any other symptoms. Pt. states that the pain is due to her arthritis.   States that she has a BM everyday since starting Miralax, stool is formed.  States her oncologist started her on Miralax and Prilosec and those medications have helped with her constipation and acid reflux.   Pt. States that at [facility] her BP has been running low SBP in the 90s. States that staff there tell her to lay in bed and drink plenty of fluids. Her Aid is with her today and has been with her for the past two days. States that her BP for the last two days were 140/81, 146/82.   Pt. states hat her mood and anxiety level has improved with the medication.   Review of Systems:  Review of Systems  Constitutional: Negative for fever, chills, weight loss and malaise/fatigue.  HENT:       Has headaches sometimes in the evening but resolve after taking tylenol  Eyes: Negative for blurred vision and double vision.  Respiratory: Negative for shortness of breath.   Cardiovascular: Negative for chest pain.  Gastrointestinal: Negative for nausea, vomiting, diarrhea and constipation.  Musculoskeletal: Negative for falls.  Neurological: Positive for dizziness and headaches.        Sometimes when she stands up or just when she is walking.   Psychiatric/Behavioral: Negative for depression and memory loss. The patient is not nervous/anxious.      Past Medical History  Diagnosis Date  . Hypertension   . Hyperlipidemia LDL goal < 100   . Polymyalgia rheumatica   . GERD (gastroesophageal reflux disease)   . Irritable bowel syndrome   . Diverticulitis   . Sciatica   . Vertigo   . Edema of leg   . Glaucoma   . Appendicitis   . Arthritis   . Acute upper respiratory infections of unspecified site   . Urge incontinence   . Other specified symptom associated with female genital organs   . Regional enteritis of unspecified site   . Sebaceous cyst   . Depression   . Other malaise and fatigue   . Abnormal weight gain   . Leukocytopenia, unspecified   . Dementia in conditions classified elsewhere without behavioral disturbance(294.10)   . Anemia, unspecified   . Chronic kidney disease, stage III (moderate)   . Unspecified arthropathy, shoulder region   . Other B-complex deficiencies   . Unspecified vitamin D deficiency   . Memory loss   . Abnormality of gait   . Personal history of fall   . Malignant neoplasm of breast (female), unspecified site   . Unspecified glaucoma(365.9)   .  Diverticulosis of colon (without mention of hemorrhage)   . Irritable bowel syndrome   . Sciatica   . Dizziness and giddiness     Past Surgical History  Procedure Laterality Date  . Nasal sinus surgery  1966  . Cholecystectomy  1972  . Cataract extraction  1990  . Breast surgery      R breast lumpectomy for benign disease  . Breast lumpectomy  10/28/10    left breast     Social History:   reports that she has never smoked. She has never used smokeless tobacco. She reports that she does not drink alcohol or use illicit drugs.  Family History  Problem Relation Age of Onset  . Stroke Father   . Hypertension Mother   . Stroke Mother   . Breast cancer Mother   .  Heart attack Brother   . Cancer Brother   . Stroke Brother   . Heart attack Son   . Colon cancer Sister     Medications: Patient's Medications  New Prescriptions   No medications on file  Previous Medications   AMLODIPINE (NORVASC) 5 MG TABLET    Take 1 tablet (5 mg total) by mouth daily.   ASPIRIN 81 MG CHEWABLE TABLET    Chew 81 mg by mouth every other day.    BIMATOPROST (LUMIGAN) 0.01 % SOLN    Place 1 drop into both eyes at bedtime.    BRIMONIDINE-TIMOLOL (COMBIGAN) 0.2-0.5 % OPHTHALMIC SOLUTION    Place 1 drop into both eyes 2 (two) times daily.    CALCIUM CARBONATE 200 MG CAPSULE    Take 200 mg by mouth daily.    CITALOPRAM (CELEXA) 10 MG TABLET    Take 1 tablet (10 mg total) by mouth daily.   DORZOLAMIDE (TRUSOPT) 2 % OPHTHALMIC SOLUTION    Place 1 drop into both eyes 2 (two) times daily.     LIDOCAINE (LIDODERM) 5 %    Apply one patch to each shoulder, Keep on 12 hours, Then keep off for 12 hours   MIRABEGRON ER (MYRBETRIQ) 50 MG TB24    Take 1 tablet (50 mg total) by mouth daily.   MULTIPLE VITAMIN (MULTIVITAMIN WITH MINERALS) TABS    Take 1 tablet by mouth daily.   OMEPRAZOLE (PRILOSEC) 20 MG CAPSULE    Take 1 capsule (20 mg total) by mouth at bedtime.   POLYETHYLENE GLYCOL (MIRALAX / GLYCOLAX) PACKET    Take 17 g by mouth daily.   SIMVASTATIN (ZOCOR) 10 MG TABLET    Take 10 mg by mouth at bedtime.    TRAMADOL (ULTRAM) 50 MG TABLET    Take one tablet three times a day as needed for pain  Modified Medications   No medications on file  Discontinued Medications   No medications on file     Physical Exam: Filed Vitals:   11/28/12 1053  BP: 122/74  Pulse: 65  Temp: 97.6 F (36.4 C)  TempSrc: Oral  Weight: 188 lb 9.6 oz (85.548 kg)  SpO2: 95%   Physical Exam  Constitutional: She is oriented to person, place, and time. She appears well-developed and well-nourished.  Cardiovascular: Normal rate, regular rhythm, normal heart sounds and intact distal pulses.    Pulmonary/Chest: Effort normal and breath sounds normal.  Abdominal: Soft. Bowel sounds are normal.  Musculoskeletal: She exhibits edema.  Bilateral LE  Neurological: She is alert and oriented to person, place, and time.  Skin: Skin is warm and dry.  Psychiatric: She has a  normal mood and affect.   Labs reviewed: Basic Metabolic Panel:  Recent Labs  84/69/62 1144 06/27/12 1028  NA 141 142  K 4.4 4.5  CL 109* 105  CO2 25 24  GLUCOSE 88 88  BUN 29.0* 22  CREATININE 1.3* 1.30*  CALCIUM 9.2 9.6   Liver Function Tests:  Recent Labs  01/04/12 1144 06/27/12 1028 10/28/12 1602  AST 17 19 20   ALT 13 14 12   ALKPHOS 67 71 82  BILITOT 0.63 0.7 0.6  PROT 6.5 6.6 6.6  ALBUMIN 3.3*  --   --     Recent Labs  10/28/12 1602  LIPASE 13  AMYLASE 33  CBC:  Recent Labs  01/04/12 1144 10/28/12 1602  WBC 3.2* 3.4  NEUTROABS 1.7 1.7  HGB 10.3* 11.2  HCT 32.9* 34.7  MCV 94.5 94  PLT 160  --    Lipid Panel:  Recent Labs  06/27/12 1028  HDL 62  LDLCALC 91  TRIG 83  CHOLHDL 2.7   Assessment/Plan 1. Diarrhea -resolved -how having daily BMs with use of miralax  2. Unspecified essential hypertension -bps questionable--pt says running low, but new caregiver says they have been in the low 140s the past two days systolic -cont to monitor and they will inform me if running low consistently  3. Hyperlipidemia LDL goal < 100 At goal with current therapy as of may--will f/u annually -thinks she needs refills, but will check with her pharmacy  4. Generalized osteoarthritis of multiple sites -is well controlled with tylenol and prn tramadol -is working with PT for her gait stability and walking much better this visit than last  5.  Urge incontinence -better able to "hold it" with myrbetriq we started last time--continue this  Labs/tests ordered:  None today--just had labs at acute visit Next appt:  3 mos

## 2012-11-28 NOTE — Patient Instructions (Signed)
Please let MD at [facility] know if systolic blood pressure  is consistently running 90 or below.

## 2012-12-02 ENCOUNTER — Ambulatory Visit (INDEPENDENT_AMBULATORY_CARE_PROVIDER_SITE_OTHER): Payer: Medicare Other | Admitting: General Surgery

## 2012-12-02 DIAGNOSIS — R262 Difficulty in walking, not elsewhere classified: Secondary | ICD-10-CM | POA: Diagnosis not present

## 2012-12-02 DIAGNOSIS — M6281 Muscle weakness (generalized): Secondary | ICD-10-CM | POA: Diagnosis not present

## 2012-12-02 DIAGNOSIS — Z9181 History of falling: Secondary | ICD-10-CM | POA: Diagnosis not present

## 2012-12-02 DIAGNOSIS — F0390 Unspecified dementia without behavioral disturbance: Secondary | ICD-10-CM | POA: Diagnosis not present

## 2012-12-02 DIAGNOSIS — Z5189 Encounter for other specified aftercare: Secondary | ICD-10-CM | POA: Diagnosis not present

## 2012-12-02 DIAGNOSIS — M353 Polymyalgia rheumatica: Secondary | ICD-10-CM | POA: Diagnosis not present

## 2012-12-03 DIAGNOSIS — R262 Difficulty in walking, not elsewhere classified: Secondary | ICD-10-CM | POA: Diagnosis not present

## 2012-12-03 DIAGNOSIS — M353 Polymyalgia rheumatica: Secondary | ICD-10-CM | POA: Diagnosis not present

## 2012-12-03 DIAGNOSIS — M6281 Muscle weakness (generalized): Secondary | ICD-10-CM | POA: Diagnosis not present

## 2012-12-03 DIAGNOSIS — Z5189 Encounter for other specified aftercare: Secondary | ICD-10-CM | POA: Diagnosis not present

## 2012-12-03 DIAGNOSIS — Z9181 History of falling: Secondary | ICD-10-CM | POA: Diagnosis not present

## 2012-12-03 DIAGNOSIS — F0390 Unspecified dementia without behavioral disturbance: Secondary | ICD-10-CM | POA: Diagnosis not present

## 2012-12-04 DIAGNOSIS — F0390 Unspecified dementia without behavioral disturbance: Secondary | ICD-10-CM | POA: Diagnosis not present

## 2012-12-04 DIAGNOSIS — Z9181 History of falling: Secondary | ICD-10-CM | POA: Diagnosis not present

## 2012-12-04 DIAGNOSIS — M6281 Muscle weakness (generalized): Secondary | ICD-10-CM | POA: Diagnosis not present

## 2012-12-04 DIAGNOSIS — M353 Polymyalgia rheumatica: Secondary | ICD-10-CM | POA: Diagnosis not present

## 2012-12-04 DIAGNOSIS — Z5189 Encounter for other specified aftercare: Secondary | ICD-10-CM | POA: Diagnosis not present

## 2012-12-04 DIAGNOSIS — R262 Difficulty in walking, not elsewhere classified: Secondary | ICD-10-CM | POA: Diagnosis not present

## 2012-12-05 ENCOUNTER — Other Ambulatory Visit: Payer: Self-pay | Admitting: Internal Medicine

## 2012-12-05 DIAGNOSIS — Z9181 History of falling: Secondary | ICD-10-CM | POA: Diagnosis not present

## 2012-12-05 DIAGNOSIS — R262 Difficulty in walking, not elsewhere classified: Secondary | ICD-10-CM | POA: Diagnosis not present

## 2012-12-05 DIAGNOSIS — Z5189 Encounter for other specified aftercare: Secondary | ICD-10-CM | POA: Diagnosis not present

## 2012-12-05 DIAGNOSIS — M6281 Muscle weakness (generalized): Secondary | ICD-10-CM | POA: Diagnosis not present

## 2012-12-05 DIAGNOSIS — F0390 Unspecified dementia without behavioral disturbance: Secondary | ICD-10-CM | POA: Diagnosis not present

## 2012-12-05 DIAGNOSIS — M353 Polymyalgia rheumatica: Secondary | ICD-10-CM | POA: Diagnosis not present

## 2012-12-05 MED ORDER — TRAMADOL HCL 50 MG PO TABS: ORAL_TABLET | ORAL | Status: DC

## 2012-12-09 DIAGNOSIS — R262 Difficulty in walking, not elsewhere classified: Secondary | ICD-10-CM | POA: Diagnosis not present

## 2012-12-09 DIAGNOSIS — M353 Polymyalgia rheumatica: Secondary | ICD-10-CM | POA: Diagnosis not present

## 2012-12-09 DIAGNOSIS — Z5189 Encounter for other specified aftercare: Secondary | ICD-10-CM | POA: Diagnosis not present

## 2012-12-09 DIAGNOSIS — M6281 Muscle weakness (generalized): Secondary | ICD-10-CM | POA: Diagnosis not present

## 2012-12-09 DIAGNOSIS — F0390 Unspecified dementia without behavioral disturbance: Secondary | ICD-10-CM | POA: Diagnosis not present

## 2012-12-09 DIAGNOSIS — Z9181 History of falling: Secondary | ICD-10-CM | POA: Diagnosis not present

## 2012-12-10 DIAGNOSIS — F0390 Unspecified dementia without behavioral disturbance: Secondary | ICD-10-CM | POA: Diagnosis not present

## 2012-12-10 DIAGNOSIS — M353 Polymyalgia rheumatica: Secondary | ICD-10-CM | POA: Diagnosis not present

## 2012-12-10 DIAGNOSIS — Z9181 History of falling: Secondary | ICD-10-CM | POA: Diagnosis not present

## 2012-12-10 DIAGNOSIS — M6281 Muscle weakness (generalized): Secondary | ICD-10-CM | POA: Diagnosis not present

## 2012-12-10 DIAGNOSIS — R262 Difficulty in walking, not elsewhere classified: Secondary | ICD-10-CM | POA: Diagnosis not present

## 2012-12-10 DIAGNOSIS — Z5189 Encounter for other specified aftercare: Secondary | ICD-10-CM | POA: Diagnosis not present

## 2012-12-11 DIAGNOSIS — H409 Unspecified glaucoma: Secondary | ICD-10-CM | POA: Diagnosis not present

## 2012-12-11 DIAGNOSIS — F0390 Unspecified dementia without behavioral disturbance: Secondary | ICD-10-CM | POA: Diagnosis not present

## 2012-12-11 DIAGNOSIS — R262 Difficulty in walking, not elsewhere classified: Secondary | ICD-10-CM | POA: Diagnosis not present

## 2012-12-11 DIAGNOSIS — Z5189 Encounter for other specified aftercare: Secondary | ICD-10-CM | POA: Diagnosis not present

## 2012-12-11 DIAGNOSIS — Z961 Presence of intraocular lens: Secondary | ICD-10-CM | POA: Diagnosis not present

## 2012-12-11 DIAGNOSIS — M353 Polymyalgia rheumatica: Secondary | ICD-10-CM | POA: Diagnosis not present

## 2012-12-11 DIAGNOSIS — M6281 Muscle weakness (generalized): Secondary | ICD-10-CM | POA: Diagnosis not present

## 2012-12-11 DIAGNOSIS — Z9181 History of falling: Secondary | ICD-10-CM | POA: Diagnosis not present

## 2012-12-11 DIAGNOSIS — H4011X0 Primary open-angle glaucoma, stage unspecified: Secondary | ICD-10-CM | POA: Diagnosis not present

## 2012-12-12 ENCOUNTER — Ambulatory Visit: Payer: Medicare Other | Admitting: Podiatry

## 2012-12-12 DIAGNOSIS — Z9181 History of falling: Secondary | ICD-10-CM | POA: Diagnosis not present

## 2012-12-12 DIAGNOSIS — M353 Polymyalgia rheumatica: Secondary | ICD-10-CM | POA: Diagnosis not present

## 2012-12-12 DIAGNOSIS — R262 Difficulty in walking, not elsewhere classified: Secondary | ICD-10-CM | POA: Diagnosis not present

## 2012-12-12 DIAGNOSIS — M6281 Muscle weakness (generalized): Secondary | ICD-10-CM | POA: Diagnosis not present

## 2012-12-12 DIAGNOSIS — Z5189 Encounter for other specified aftercare: Secondary | ICD-10-CM | POA: Diagnosis not present

## 2012-12-12 DIAGNOSIS — F0390 Unspecified dementia without behavioral disturbance: Secondary | ICD-10-CM | POA: Diagnosis not present

## 2012-12-13 ENCOUNTER — Encounter (INDEPENDENT_AMBULATORY_CARE_PROVIDER_SITE_OTHER): Payer: Self-pay

## 2012-12-13 ENCOUNTER — Encounter (INDEPENDENT_AMBULATORY_CARE_PROVIDER_SITE_OTHER): Payer: Self-pay | Admitting: General Surgery

## 2012-12-13 ENCOUNTER — Ambulatory Visit (INDEPENDENT_AMBULATORY_CARE_PROVIDER_SITE_OTHER): Payer: Medicare Other | Admitting: General Surgery

## 2012-12-13 VITALS — BP 150/90 | HR 64 | Temp 97.6°F | Resp 14 | Ht 64.0 in | Wt 188.4 lb

## 2012-12-13 DIAGNOSIS — C50919 Malignant neoplasm of unspecified site of unspecified female breast: Secondary | ICD-10-CM

## 2012-12-13 DIAGNOSIS — D0512 Intraductal carcinoma in situ of left breast: Secondary | ICD-10-CM

## 2012-12-13 NOTE — Patient Instructions (Signed)
Follow up in late April.    Get mammogram prior to visit.

## 2012-12-16 DIAGNOSIS — Z9181 History of falling: Secondary | ICD-10-CM | POA: Diagnosis not present

## 2012-12-16 DIAGNOSIS — F0390 Unspecified dementia without behavioral disturbance: Secondary | ICD-10-CM | POA: Diagnosis not present

## 2012-12-16 DIAGNOSIS — Z5189 Encounter for other specified aftercare: Secondary | ICD-10-CM | POA: Diagnosis not present

## 2012-12-16 DIAGNOSIS — M6281 Muscle weakness (generalized): Secondary | ICD-10-CM | POA: Diagnosis not present

## 2012-12-16 DIAGNOSIS — R262 Difficulty in walking, not elsewhere classified: Secondary | ICD-10-CM | POA: Diagnosis not present

## 2012-12-16 DIAGNOSIS — M353 Polymyalgia rheumatica: Secondary | ICD-10-CM | POA: Diagnosis not present

## 2012-12-16 NOTE — Progress Notes (Signed)
HISTORY: Pt has no change in clinical history or breast issues.  She has occasional breast pain on the left.  She continues to do well.  She has had no new health problems.    PERTINENT REVIEW OF SYSTEMS: Otherwise negative.    EXAM: Head: Normocephalic and atraumatic.  Eyes:  Conjunctivae injected, and there is swelling around both eyes. Pupils are equal, round, and reactive to light. No scleral icterus.  Neck:  Normal range of motion. Neck supple. No tracheal deviation present. No thyromegaly present. No LAD.   Resp: No respiratory distress, normal effort. Breast:  There are no palpable masses in either breast.  There is some thickening of the scar on the left breast that remains.  There is no skin dimpling or nipple retraction.   Abd:  Abdomen is soft, non distended and non tender. No masses are palpable.  There is no rebound and no guarding.  Neurological: Alert and oriented to person, place, and time. Coordination impaired. Skin: Skin is warm and dry. No rash noted. No diaphoretic. No erythema. No pallor.  Psychiatric: Normal mood and affect. Normal behavior. Judgment and thought content normal.   Mammogram IMPRESSION:  No specific mammographic evidence of breast malignancy.    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    Mammogram in August.        Maudry Diego, MD Surgical Oncology, General & Endocrine Surgery [facility]  REED, TIFFANY, DO Reed, Tiffany L, DO

## 2012-12-16 NOTE — Assessment & Plan Note (Addendum)
No clinical evidence of disease.    Follow up in 6 months after mammogram.

## 2012-12-17 DIAGNOSIS — M6281 Muscle weakness (generalized): Secondary | ICD-10-CM | POA: Diagnosis not present

## 2012-12-17 DIAGNOSIS — F0390 Unspecified dementia without behavioral disturbance: Secondary | ICD-10-CM | POA: Diagnosis not present

## 2012-12-17 DIAGNOSIS — Z5189 Encounter for other specified aftercare: Secondary | ICD-10-CM | POA: Diagnosis not present

## 2012-12-17 DIAGNOSIS — R262 Difficulty in walking, not elsewhere classified: Secondary | ICD-10-CM | POA: Diagnosis not present

## 2012-12-17 DIAGNOSIS — Z9181 History of falling: Secondary | ICD-10-CM | POA: Diagnosis not present

## 2012-12-17 DIAGNOSIS — M353 Polymyalgia rheumatica: Secondary | ICD-10-CM | POA: Diagnosis not present

## 2012-12-18 ENCOUNTER — Encounter: Payer: Self-pay | Admitting: Podiatry

## 2012-12-18 ENCOUNTER — Ambulatory Visit (INDEPENDENT_AMBULATORY_CARE_PROVIDER_SITE_OTHER): Payer: Medicare Other | Admitting: Podiatry

## 2012-12-18 VITALS — BP 122/70 | HR 54 | Resp 14

## 2012-12-18 DIAGNOSIS — L84 Corns and callosities: Secondary | ICD-10-CM

## 2012-12-18 DIAGNOSIS — M6281 Muscle weakness (generalized): Secondary | ICD-10-CM | POA: Diagnosis not present

## 2012-12-18 DIAGNOSIS — M79609 Pain in unspecified limb: Secondary | ICD-10-CM | POA: Diagnosis not present

## 2012-12-18 DIAGNOSIS — B351 Tinea unguium: Secondary | ICD-10-CM

## 2012-12-18 DIAGNOSIS — F0390 Unspecified dementia without behavioral disturbance: Secondary | ICD-10-CM | POA: Diagnosis not present

## 2012-12-18 DIAGNOSIS — R262 Difficulty in walking, not elsewhere classified: Secondary | ICD-10-CM | POA: Diagnosis not present

## 2012-12-18 DIAGNOSIS — Z9181 History of falling: Secondary | ICD-10-CM | POA: Diagnosis not present

## 2012-12-18 DIAGNOSIS — M353 Polymyalgia rheumatica: Secondary | ICD-10-CM | POA: Diagnosis not present

## 2012-12-18 DIAGNOSIS — Z5189 Encounter for other specified aftercare: Secondary | ICD-10-CM | POA: Diagnosis not present

## 2012-12-18 NOTE — Progress Notes (Signed)
Subjective:     Patient ID: Michelle [name], female   DOB: 09/01/1920, 79 y.o.   MRN: [id number]  HPI patient has nail disease 1-5 bilateral with thickness noted and lesion formation on both feet with both of them being tender and painful   Review of Systems     Objective:   Physical Exam Neurovascular status unchanged and nail disease 1-5 bilateral with thick debris    Assessment:     Painful mycotic nail infection with pain and lesions that are discomfort    Plan:     Debridement of nailbeds 1-5 bilateral and debridement of lesions with no bleeding noted

## 2012-12-19 DIAGNOSIS — M6281 Muscle weakness (generalized): Secondary | ICD-10-CM | POA: Diagnosis not present

## 2012-12-19 DIAGNOSIS — F0390 Unspecified dementia without behavioral disturbance: Secondary | ICD-10-CM | POA: Diagnosis not present

## 2012-12-19 DIAGNOSIS — M353 Polymyalgia rheumatica: Secondary | ICD-10-CM | POA: Diagnosis not present

## 2012-12-19 DIAGNOSIS — Z5189 Encounter for other specified aftercare: Secondary | ICD-10-CM | POA: Diagnosis not present

## 2012-12-19 DIAGNOSIS — R262 Difficulty in walking, not elsewhere classified: Secondary | ICD-10-CM | POA: Diagnosis not present

## 2012-12-19 DIAGNOSIS — Z9181 History of falling: Secondary | ICD-10-CM | POA: Diagnosis not present

## 2012-12-20 ENCOUNTER — Other Ambulatory Visit: Payer: Self-pay | Admitting: Nurse Practitioner

## 2012-12-24 DIAGNOSIS — R262 Difficulty in walking, not elsewhere classified: Secondary | ICD-10-CM | POA: Diagnosis not present

## 2012-12-24 DIAGNOSIS — M353 Polymyalgia rheumatica: Secondary | ICD-10-CM | POA: Diagnosis not present

## 2012-12-24 DIAGNOSIS — F0390 Unspecified dementia without behavioral disturbance: Secondary | ICD-10-CM | POA: Diagnosis not present

## 2012-12-24 DIAGNOSIS — M6281 Muscle weakness (generalized): Secondary | ICD-10-CM | POA: Diagnosis not present

## 2012-12-24 DIAGNOSIS — Z9181 History of falling: Secondary | ICD-10-CM | POA: Diagnosis not present

## 2012-12-24 DIAGNOSIS — Z5189 Encounter for other specified aftercare: Secondary | ICD-10-CM | POA: Diagnosis not present

## 2012-12-25 ENCOUNTER — Encounter: Payer: Self-pay | Admitting: Nurse Practitioner

## 2012-12-25 ENCOUNTER — Ambulatory Visit (INDEPENDENT_AMBULATORY_CARE_PROVIDER_SITE_OTHER): Payer: Medicare Other | Admitting: Nurse Practitioner

## 2012-12-25 VITALS — BP 132/76 | HR 74 | Temp 97.7°F | Resp 14 | Wt 189.2 lb

## 2012-12-25 DIAGNOSIS — J019 Acute sinusitis, unspecified: Secondary | ICD-10-CM

## 2012-12-25 MED ORDER — AMOXICILLIN 500 MG PO CAPS: 500.0000 mg | ORAL_CAPSULE | Freq: Three times a day (TID) | ORAL | Status: DC

## 2012-12-25 NOTE — Patient Instructions (Addendum)
Take Claritin (loraditine) 10 mg daily Take Mucinex DM 1 tablet twice a day with a full glass of water PLAIN NASAL SALINE spray in nose 3-4 times daily  If no better by Friday start antibiotic amoxicillin to take three times daily for 7 days

## 2012-12-25 NOTE — Progress Notes (Signed)
Patient ID: Michelle [name], female   DOB: 16-Jun-1920, 79 y.o.   MRN: [id number]    Allergies  Allergen Reactions  . Codeine     States she blacks out    Chief Complaint  Patient presents with  . Acute Visit    nasal/head congestion along with HA and dizziness x 4-5 days, has taken Tylenol and NyQuil  with some relief    HPI: Patient is a 79 y.o. female seen in the office today for congestion for several days; for the past 4-5 days has had headache, congestion, stuffy nose (not runny),  No fever or chills, eating and drinking good, not getting worse or better  Headache is the worse and feeling unsteady tylenol has helped her headache Cough and increase in mucus in the morning  No shortness of breath, or chest pain  Review of Systems:  Review of Systems  Constitutional: Positive for malaise/fatigue. Negative for fever and chills.  HENT: Positive for congestion. Negative for sore throat.        Fullness in ears  Respiratory: Positive for cough and sputum production. Negative for shortness of breath.   Cardiovascular: Negative for chest pain.  Gastrointestinal: Negative for diarrhea and constipation.  Genitourinary: Negative for dysuria.  Musculoskeletal: Positive for joint pain (shoulder ). Negative for myalgias.  Neurological: Positive for headaches. Negative for weakness.       Unbalanced     Past Medical History  Diagnosis Date  . Hypertension   . Hyperlipidemia LDL goal < 100   . Polymyalgia rheumatica   . GERD (gastroesophageal reflux disease)   . Irritable bowel syndrome   . Diverticulitis   . Sciatica   . Vertigo   . Edema of leg   . Glaucoma   . Appendicitis   . Arthritis   . Acute upper respiratory infections of unspecified site   . Urge incontinence   . Other specified symptom associated with female genital organs   . Regional enteritis of unspecified site   . Sebaceous cyst   . Depression   . Other malaise and fatigue   . Abnormal weight gain   .  Leukocytopenia, unspecified   . Dementia in conditions classified elsewhere without behavioral disturbance(294.10)   . Anemia, unspecified   . Chronic kidney disease, stage III (moderate)   . Unspecified arthropathy, shoulder region   . Other B-complex deficiencies   . Unspecified vitamin D deficiency   . Memory loss   . Abnormality of gait   . Personal history of fall   . Malignant neoplasm of breast (female), unspecified site   . Unspecified glaucoma(365.9)   . Diverticulosis of colon (without mention of hemorrhage)   . Irritable bowel syndrome   . Sciatica   . Dizziness and giddiness    Past Surgical History  Procedure Laterality Date  . Nasal sinus surgery  1966  . Cholecystectomy  1972  . Cataract extraction  1990  . Breast surgery      R breast lumpectomy for benign disease  . Breast lumpectomy  10/28/10    left breast    Social History:   reports that she has never smoked. She has never used smokeless tobacco. She reports that she does not drink alcohol or use illicit drugs.  Family History  Problem Relation Age of Onset  . Stroke Father   . Hypertension Mother   . Stroke Mother   . Breast cancer Mother   . Heart attack Brother   . Cancer Brother   .  Stroke Brother   . Heart attack Son   . Colon cancer Sister     Medications: Patient's Medications  New Prescriptions   No medications on file  Previous Medications   AMLODIPINE (NORVASC) 5 MG TABLET    Take 1 tablet (5 mg total) by mouth daily.   ASPIRIN 81 MG CHEWABLE TABLET    Chew 81 mg by mouth every other day.    BIMATOPROST (LUMIGAN) 0.01 % SOLN    Place 1 drop into both eyes at bedtime.    BRIMONIDINE-TIMOLOL (COMBIGAN) 0.2-0.5 % OPHTHALMIC SOLUTION    Place 1 drop into both eyes 2 (two) times daily.    CALCIUM CARBONATE 200 MG CAPSULE    Take 200 mg by mouth daily.    CITALOPRAM (CELEXA) 10 MG TABLET    Take 1 tablet (10 mg total) by mouth daily.   DORZOLAMIDE (TRUSOPT) 2 % OPHTHALMIC SOLUTION    Place  1 drop into both eyes 2 (two) times daily.     LIDOCAINE (LIDODERM) 5 %    Apply one patch to each shoulder, Keep on 12 hours, Then keep off for 12 hours   MULTIPLE VITAMIN (MULTIVITAMIN WITH MINERALS) TABS    Take 1 tablet by mouth daily.   MYRBETRIQ 50 MG TB24 TABLET    TAKE 1 TABLET ONCE DAILY.   OMEPRAZOLE (PRILOSEC) 20 MG CAPSULE    Take 1 capsule (20 mg total) by mouth at bedtime.   POLYETHYLENE GLYCOL (MIRALAX / GLYCOLAX) PACKET    Take 17 g by mouth daily.   SIMVASTATIN (ZOCOR) 10 MG TABLET    TAKE 1 TABLET AT BEDTIME FOR CHOLESTEROL.   TRAMADOL (ULTRAM) 50 MG TABLET    Take one tablet three times a day as needed for pain  Modified Medications   No medications on file  Discontinued Medications   No medications on file     Physical Exam:  Filed Vitals:   12/25/12 1537  BP: 132/76  Pulse: 74  Temp: 97.7 F (36.5 C)  TempSrc: Oral  Resp: 14  Weight: 189 lb 3.2 oz (85.821 kg)  SpO2: 95%    Physical Exam  Constitutional: She is well-developed, well-nourished, and in no distress. No distress.  HENT:  Head: Normocephalic and atraumatic.  Right Ear: External ear normal.  Left Ear: External ear normal.  Nose: Nose normal.  Mouth/Throat: Oropharynx is clear and moist. No oropharyngeal exudate.  Eyes: Conjunctivae and EOM are normal. Pupils are equal, round, and reactive to light.  Neck: Normal range of motion. Neck supple. No thyromegaly present.  Cardiovascular: Normal rate, regular rhythm and normal heart sounds.   Pulmonary/Chest: Effort normal and breath sounds normal. No respiratory distress.  Abdominal: Soft. Bowel sounds are normal. She exhibits no distension.  Musculoskeletal: She exhibits no edema.  Lymphadenopathy:    She has no cervical adenopathy.  Neurological: She is alert.  Skin: Skin is warm and dry. She is not diaphoretic.  Psychiatric: Affect normal.     Labs reviewed: Basic Metabolic Panel:  Recent Labs  16/10/96 1144 06/27/12 1028  NA 141  142  K 4.4 4.5  CL 109* 105  CO2 25 24  GLUCOSE 88 88  BUN 29.0* 22  CREATININE 1.3* 1.30*  CALCIUM 9.2 9.6   Liver Function Tests:  Recent Labs  01/04/12 1144 06/27/12 1028 10/28/12 1602  AST 17 19 20   ALT 13 14 12   ALKPHOS 67 71 82  BILITOT 0.63 0.7 0.6  PROT 6.5 6.6 6.6  ALBUMIN 3.3*  --   --     Recent Labs  10/28/12 1602  LIPASE 13  AMYLASE 33   No results found for this basename: AMMONIA,  in the last 8760 hours CBC:  Recent Labs  01/04/12 1144 10/28/12 1602  WBC 3.2* 3.4  NEUTROABS 1.7 1.7  HGB 10.3* 11.2  HCT 32.9* 34.7  MCV 94.5 94  PLT 160  --    Lipid Panel:  Recent Labs  06/27/12 1028  HDL 62  LDLCALC 91  TRIG 83  CHOLHDL 2.7   TSH: No results found for this basename: TSH,  in the last 8760 hours A1C: No components found with this basename: A1C,    Assessment/Plan 1. Acute sinus infection -increase water intake -mucinex DM BID with full glass of water Claritin 10 mg daily -stop taking nyquil - amoxicillin (AMOXIL) 500 MG capsule; Take 1 capsule (500 mg total) by mouth 3 (three) times daily.  Dispense: 21 capsule; Refill: 0 to start in 2 days if no better - to seek medical attention if no better or worse on antibiotics

## 2012-12-30 DIAGNOSIS — R262 Difficulty in walking, not elsewhere classified: Secondary | ICD-10-CM | POA: Diagnosis not present

## 2012-12-30 DIAGNOSIS — M6281 Muscle weakness (generalized): Secondary | ICD-10-CM | POA: Diagnosis not present

## 2012-12-30 DIAGNOSIS — F0390 Unspecified dementia without behavioral disturbance: Secondary | ICD-10-CM | POA: Diagnosis not present

## 2012-12-30 DIAGNOSIS — M353 Polymyalgia rheumatica: Secondary | ICD-10-CM | POA: Diagnosis not present

## 2012-12-30 DIAGNOSIS — Z9181 History of falling: Secondary | ICD-10-CM | POA: Diagnosis not present

## 2012-12-30 DIAGNOSIS — Z5189 Encounter for other specified aftercare: Secondary | ICD-10-CM | POA: Diagnosis not present

## 2012-12-31 DIAGNOSIS — Z9181 History of falling: Secondary | ICD-10-CM | POA: Diagnosis not present

## 2012-12-31 DIAGNOSIS — R262 Difficulty in walking, not elsewhere classified: Secondary | ICD-10-CM | POA: Diagnosis not present

## 2012-12-31 DIAGNOSIS — M353 Polymyalgia rheumatica: Secondary | ICD-10-CM | POA: Diagnosis not present

## 2012-12-31 DIAGNOSIS — F0390 Unspecified dementia without behavioral disturbance: Secondary | ICD-10-CM | POA: Diagnosis not present

## 2012-12-31 DIAGNOSIS — M6281 Muscle weakness (generalized): Secondary | ICD-10-CM | POA: Diagnosis not present

## 2012-12-31 DIAGNOSIS — Z5189 Encounter for other specified aftercare: Secondary | ICD-10-CM | POA: Diagnosis not present

## 2013-01-01 DIAGNOSIS — F0390 Unspecified dementia without behavioral disturbance: Secondary | ICD-10-CM | POA: Diagnosis not present

## 2013-01-01 DIAGNOSIS — M6281 Muscle weakness (generalized): Secondary | ICD-10-CM | POA: Diagnosis not present

## 2013-01-01 DIAGNOSIS — Z9181 History of falling: Secondary | ICD-10-CM | POA: Diagnosis not present

## 2013-01-01 DIAGNOSIS — Z5189 Encounter for other specified aftercare: Secondary | ICD-10-CM | POA: Diagnosis not present

## 2013-01-01 DIAGNOSIS — M353 Polymyalgia rheumatica: Secondary | ICD-10-CM | POA: Diagnosis not present

## 2013-01-01 DIAGNOSIS — R262 Difficulty in walking, not elsewhere classified: Secondary | ICD-10-CM | POA: Diagnosis not present

## 2013-01-06 DIAGNOSIS — F0390 Unspecified dementia without behavioral disturbance: Secondary | ICD-10-CM | POA: Diagnosis not present

## 2013-01-06 DIAGNOSIS — Z9181 History of falling: Secondary | ICD-10-CM | POA: Diagnosis not present

## 2013-01-06 DIAGNOSIS — M6281 Muscle weakness (generalized): Secondary | ICD-10-CM | POA: Diagnosis not present

## 2013-01-06 DIAGNOSIS — M353 Polymyalgia rheumatica: Secondary | ICD-10-CM | POA: Diagnosis not present

## 2013-01-06 DIAGNOSIS — R262 Difficulty in walking, not elsewhere classified: Secondary | ICD-10-CM | POA: Diagnosis not present

## 2013-01-06 DIAGNOSIS — Z5189 Encounter for other specified aftercare: Secondary | ICD-10-CM | POA: Diagnosis not present

## 2013-01-07 DIAGNOSIS — F0390 Unspecified dementia without behavioral disturbance: Secondary | ICD-10-CM | POA: Diagnosis not present

## 2013-01-07 DIAGNOSIS — M353 Polymyalgia rheumatica: Secondary | ICD-10-CM | POA: Diagnosis not present

## 2013-01-07 DIAGNOSIS — R262 Difficulty in walking, not elsewhere classified: Secondary | ICD-10-CM | POA: Diagnosis not present

## 2013-01-07 DIAGNOSIS — M6281 Muscle weakness (generalized): Secondary | ICD-10-CM | POA: Diagnosis not present

## 2013-01-07 DIAGNOSIS — Z9181 History of falling: Secondary | ICD-10-CM | POA: Diagnosis not present

## 2013-01-07 DIAGNOSIS — Z5189 Encounter for other specified aftercare: Secondary | ICD-10-CM | POA: Diagnosis not present

## 2013-01-09 DIAGNOSIS — Z9181 History of falling: Secondary | ICD-10-CM | POA: Diagnosis not present

## 2013-01-09 DIAGNOSIS — M6281 Muscle weakness (generalized): Secondary | ICD-10-CM | POA: Diagnosis not present

## 2013-01-09 DIAGNOSIS — R262 Difficulty in walking, not elsewhere classified: Secondary | ICD-10-CM | POA: Diagnosis not present

## 2013-01-09 DIAGNOSIS — Z5189 Encounter for other specified aftercare: Secondary | ICD-10-CM | POA: Diagnosis not present

## 2013-01-09 DIAGNOSIS — F0390 Unspecified dementia without behavioral disturbance: Secondary | ICD-10-CM | POA: Diagnosis not present

## 2013-01-09 DIAGNOSIS — M353 Polymyalgia rheumatica: Secondary | ICD-10-CM | POA: Diagnosis not present

## 2013-01-10 DIAGNOSIS — F0390 Unspecified dementia without behavioral disturbance: Secondary | ICD-10-CM | POA: Diagnosis not present

## 2013-01-10 DIAGNOSIS — M6281 Muscle weakness (generalized): Secondary | ICD-10-CM | POA: Diagnosis not present

## 2013-01-10 DIAGNOSIS — M353 Polymyalgia rheumatica: Secondary | ICD-10-CM | POA: Diagnosis not present

## 2013-01-10 DIAGNOSIS — Z5189 Encounter for other specified aftercare: Secondary | ICD-10-CM | POA: Diagnosis not present

## 2013-01-10 DIAGNOSIS — Z9181 History of falling: Secondary | ICD-10-CM | POA: Diagnosis not present

## 2013-01-10 DIAGNOSIS — R262 Difficulty in walking, not elsewhere classified: Secondary | ICD-10-CM | POA: Diagnosis not present

## 2013-01-13 DIAGNOSIS — Z9181 History of falling: Secondary | ICD-10-CM | POA: Diagnosis not present

## 2013-01-13 DIAGNOSIS — M353 Polymyalgia rheumatica: Secondary | ICD-10-CM | POA: Diagnosis not present

## 2013-01-13 DIAGNOSIS — Z5189 Encounter for other specified aftercare: Secondary | ICD-10-CM | POA: Diagnosis not present

## 2013-01-13 DIAGNOSIS — M6281 Muscle weakness (generalized): Secondary | ICD-10-CM | POA: Diagnosis not present

## 2013-01-13 DIAGNOSIS — R262 Difficulty in walking, not elsewhere classified: Secondary | ICD-10-CM | POA: Diagnosis not present

## 2013-01-13 DIAGNOSIS — F0390 Unspecified dementia without behavioral disturbance: Secondary | ICD-10-CM | POA: Diagnosis not present

## 2013-01-14 DIAGNOSIS — Z9181 History of falling: Secondary | ICD-10-CM | POA: Diagnosis not present

## 2013-01-14 DIAGNOSIS — F0390 Unspecified dementia without behavioral disturbance: Secondary | ICD-10-CM | POA: Diagnosis not present

## 2013-01-14 DIAGNOSIS — M6281 Muscle weakness (generalized): Secondary | ICD-10-CM | POA: Diagnosis not present

## 2013-01-14 DIAGNOSIS — R262 Difficulty in walking, not elsewhere classified: Secondary | ICD-10-CM | POA: Diagnosis not present

## 2013-01-14 DIAGNOSIS — Z5189 Encounter for other specified aftercare: Secondary | ICD-10-CM | POA: Diagnosis not present

## 2013-01-14 DIAGNOSIS — M353 Polymyalgia rheumatica: Secondary | ICD-10-CM | POA: Diagnosis not present

## 2013-01-15 DIAGNOSIS — F0390 Unspecified dementia without behavioral disturbance: Secondary | ICD-10-CM | POA: Diagnosis not present

## 2013-01-15 DIAGNOSIS — Z5189 Encounter for other specified aftercare: Secondary | ICD-10-CM | POA: Diagnosis not present

## 2013-01-15 DIAGNOSIS — Z9181 History of falling: Secondary | ICD-10-CM | POA: Diagnosis not present

## 2013-01-15 DIAGNOSIS — M6281 Muscle weakness (generalized): Secondary | ICD-10-CM | POA: Diagnosis not present

## 2013-01-15 DIAGNOSIS — R262 Difficulty in walking, not elsewhere classified: Secondary | ICD-10-CM | POA: Diagnosis not present

## 2013-01-15 DIAGNOSIS — M353 Polymyalgia rheumatica: Secondary | ICD-10-CM | POA: Diagnosis not present

## 2013-01-16 DIAGNOSIS — Z9181 History of falling: Secondary | ICD-10-CM | POA: Diagnosis not present

## 2013-01-16 DIAGNOSIS — M353 Polymyalgia rheumatica: Secondary | ICD-10-CM | POA: Diagnosis not present

## 2013-01-16 DIAGNOSIS — M6281 Muscle weakness (generalized): Secondary | ICD-10-CM | POA: Diagnosis not present

## 2013-01-16 DIAGNOSIS — Z5189 Encounter for other specified aftercare: Secondary | ICD-10-CM | POA: Diagnosis not present

## 2013-01-16 DIAGNOSIS — R262 Difficulty in walking, not elsewhere classified: Secondary | ICD-10-CM | POA: Diagnosis not present

## 2013-01-16 DIAGNOSIS — F0390 Unspecified dementia without behavioral disturbance: Secondary | ICD-10-CM | POA: Diagnosis not present

## 2013-02-17 ENCOUNTER — Encounter: Payer: Self-pay | Admitting: Internal Medicine

## 2013-02-17 ENCOUNTER — Ambulatory Visit (INDEPENDENT_AMBULATORY_CARE_PROVIDER_SITE_OTHER): Payer: Medicare Other | Admitting: Internal Medicine

## 2013-02-17 VITALS — BP 126/70 | HR 52 | Temp 97.0°F | Wt 191.0 lb

## 2013-02-17 DIAGNOSIS — J329 Chronic sinusitis, unspecified: Secondary | ICD-10-CM

## 2013-02-17 DIAGNOSIS — M353 Polymyalgia rheumatica: Secondary | ICD-10-CM

## 2013-02-17 DIAGNOSIS — N3941 Urge incontinence: Secondary | ICD-10-CM

## 2013-02-17 DIAGNOSIS — R1031 Right lower quadrant pain: Secondary | ICD-10-CM

## 2013-02-17 DIAGNOSIS — R609 Edema, unspecified: Secondary | ICD-10-CM

## 2013-02-17 DIAGNOSIS — R410 Disorientation, unspecified: Secondary | ICD-10-CM

## 2013-02-17 DIAGNOSIS — R404 Transient alteration of awareness: Secondary | ICD-10-CM | POA: Diagnosis not present

## 2013-02-17 DIAGNOSIS — R300 Dysuria: Secondary | ICD-10-CM | POA: Diagnosis not present

## 2013-02-17 LAB — POCT URINALYSIS DIPSTICK
Bilirubin, UA: NEGATIVE
Blood, UA: NEGATIVE
Glucose, UA: NEGATIVE
Ketones, UA: NEGATIVE
Nitrite, UA: NEGATIVE
Protein, UA: NEGATIVE
Spec Grav, UA: 1.015
Urobilinogen, UA: 0.2
pH, UA: 6.5

## 2013-02-17 MED ORDER — MOMETASONE FUROATE 50 MCG/ACT NA SUSP: 2.0000 | Freq: Every day | NASAL | Status: DC

## 2013-02-17 NOTE — Progress Notes (Signed)
Patient ID: Michelle [name], female   DOB: Jan 16, 1921, 80 y.o.   MRN: [id number]   Location:  [facility]  Code Status: DNR   Allergies  Allergen Reactions  . Codeine     States she blacks out    Chief Complaint  Patient presents with  . Medical Managment of Chronic Issues    3 month follow-up   . Dysuria    Pain when urinating since the weekend   . URI    Cold symptoms     HPI: Patient is a 80 y.o. female seen in the office today for medical mgt of chronic diseases.  She has been having cold symptoms.  3-4 wks ago had cold.  Still hasn't completely recovered.  Still coughing in evening.  Has a lot of mucus in the morning when she gets up.  Did not take medicine daily like she was supposed to per aide.   Was using tylenol 2 twice a day.  Didn't finish the amoxicillin, did not take the claritin.  Did use the mucinex and still has some.  No appetite.  Says not drinking much fluids.  Drinking a lot of tea.  She has dysuria.  Stayed in bed all day Sat.  Having right groin pain over the weekend also.    Notes edema of bilateral feet.  Apparently sits with lift chair in elevated position per aide.    Review of Systems:  Review of Systems  Constitutional: Positive for weight loss and malaise/fatigue. Negative for fever and chills.       Weaker than usual  Eyes: Negative for blurred vision.  Respiratory: Negative for shortness of breath.   Cardiovascular: Negative for chest pain and leg swelling.  Gastrointestinal: Negative for heartburn.  Genitourinary: Positive for dysuria. Negative for urgency, frequency, hematuria and flank pain.  Musculoskeletal: Positive for joint pain. Negative for falls.       Shoulder pain  Skin: Negative for rash.  Neurological: Positive for dizziness and weakness. Negative for loss of consciousness and headaches.  Psychiatric/Behavioral: Positive for depression and memory loss.       Memory much worse than usual  today     Past Medical History  Diagnosis Date  . Hypertension   . Hyperlipidemia LDL goal < 100   . Polymyalgia rheumatica   . GERD (gastroesophageal reflux disease)   . Irritable bowel syndrome   . Diverticulitis   . Sciatica   . Vertigo   . Edema of leg   . Glaucoma   . Appendicitis   . Arthritis   . Acute upper respiratory infections of unspecified site   . Urge incontinence   . Other specified symptom associated with female genital organs   . Regional enteritis of unspecified site   . Sebaceous cyst   . Depression   . Other malaise and fatigue   . Abnormal weight gain   . Leukocytopenia, unspecified   . Dementia in conditions classified elsewhere without behavioral disturbance   . Anemia, unspecified   . Chronic kidney disease, stage III (moderate)   . Unspecified arthropathy, shoulder region   . Other B-complex deficiencies   . Unspecified vitamin D deficiency   . Memory loss   . Abnormality of gait   . Personal history of fall   . Malignant neoplasm of breast (female), unspecified site   . Unspecified glaucoma   . Diverticulosis of colon (without mention of hemorrhage)   . Irritable bowel syndrome   .  Sciatica   . Dizziness and giddiness     Past Surgical History  Procedure Laterality Date  . Nasal sinus surgery  1966  . Cholecystectomy  1972  . Cataract extraction  1990  . Breast surgery      R breast lumpectomy for benign disease  . Breast lumpectomy  10/28/10    left breast     Social History:   reports that she has never smoked. She has never used smokeless tobacco. She reports that she does not drink alcohol or use illicit drugs.  Family History  Problem Relation Age of Onset  . Stroke Father   . Hypertension Mother   . Stroke Mother   . Breast cancer Mother   . Heart attack Brother   . Cancer Brother   . Stroke Brother   . Heart attack Son   . Colon cancer Sister     Medications: Patient's Medications  New Prescriptions   No  medications on file  Previous Medications   AMLODIPINE (NORVASC) 5 MG TABLET    Take 1 tablet (5 mg total) by mouth daily.   ASPIRIN 81 MG CHEWABLE TABLET    Chew 81 mg by mouth every other day.    BIMATOPROST (LUMIGAN) 0.01 % SOLN    Place 1 drop into both eyes at bedtime.    BRIMONIDINE-TIMOLOL (COMBIGAN) 0.2-0.5 % OPHTHALMIC SOLUTION    Place 1 drop into both eyes 2 (two) times daily.    CALCIUM CARBONATE 200 MG CAPSULE    Take 200 mg by mouth daily.    CITALOPRAM (CELEXA) 10 MG TABLET    Take 1 tablet (10 mg total) by mouth daily.   DORZOLAMIDE (TRUSOPT) 2 % OPHTHALMIC SOLUTION    Place 1 drop into both eyes 2 (two) times daily.     LIDOCAINE (LIDODERM) 5 %    Apply one patch to each shoulder, Keep on 12 hours, Then keep off for 12 hours   MULTIPLE VITAMIN (MULTIVITAMIN WITH MINERALS) TABS    Take 1 tablet by mouth daily.   MYRBETRIQ 50 MG TB24 TABLET    TAKE 1 TABLET ONCE DAILY.   SIMVASTATIN (ZOCOR) 10 MG TABLET    TAKE 1 TABLET AT BEDTIME FOR CHOLESTEROL.   TRAMADOL (ULTRAM) 50 MG TABLET    Take one tablet three times a day as needed for pain  Modified Medications   No medications on file  Discontinued Medications   AMOXICILLIN (AMOXIL) 500 MG CAPSULE    Take 1 capsule (500 mg total) by mouth 3 (three) times daily.   OMEPRAZOLE (PRILOSEC) 20 MG CAPSULE    Take 1 capsule (20 mg total) by mouth at bedtime.   POLYETHYLENE GLYCOL (MIRALAX / GLYCOLAX) PACKET    Take 17 g by mouth daily.     Physical Exam: Filed Vitals:   02/17/13 1126  BP: 126/70  Pulse: 52  Temp: 97 F (36.1 C)  TempSrc: Oral  Weight: 191 lb (86.637 kg)  SpO2: 96%  Physical Exam  Constitutional: She appears well-developed and well-nourished. No distress.  HENT:  Head: Normocephalic and atraumatic.  Cardiovascular: Normal rate, regular rhythm, normal heart sounds and intact distal pulses.   Pulmonary/Chest: Effort normal and breath sounds normal. No respiratory distress.  Abdominal: Soft. Bowel sounds are  normal. She exhibits no distension. There is tenderness.  RLQ  Musculoskeletal:  Required her aide to assist her on standing today due to proximal muscle wasting  Neurological: She is alert. No cranial nerve deficit.  More  confused than her usual self  Skin: Skin is warm and dry.    Labs reviewed: Basic Metabolic Panel:  Recent Labs  06/27/12 1028  NA 142  K 4.5  CL 105  CO2 24  GLUCOSE 88  BUN 22  CREATININE 1.30*  CALCIUM 9.6   Liver Function Tests:  Recent Labs  06/27/12 1028 10/28/12 1602  AST 19 20  ALT 14 12  ALKPHOS 71 82  BILITOT 0.7 0.6  PROT 6.6 6.6    Recent Labs  10/28/12 1602  LIPASE 13  AMYLASE 33   No results found for this basename: AMMONIA,  in the last 8760 hours CBC:  Recent Labs  10/28/12 1602  WBC 3.4  NEUTROABS 1.7  HGB 11.2  HCT 34.7  MCV 94   Lipid Panel:  Recent Labs  06/27/12 1028  HDL 62  LDLCALC 91  TRIG 83  CHOLHDL 2.7   Assessment/Plan 1. Dysuria -doubt UTI, more likely dehydration from inadequate water intake again -check urine dip and c+s -also check bmp  2. Right lower quadrant pain -suspect urine is concentrated and causing discomfort, but r/o uti  3. Sinusitis, chronic -add nasonex spray for congestion -says she is coughing and still ill from URI/sinus infection, but no evidence of this on exam   4. Acute delirium -likely due to dehydration, r/o uti also -check bmp, ua c+s  5. Urge urinary incontinence -cont myrbetriq  6. Polymyalgia rheumatica -cont therapy and exercises, tramadol for pain  7. Edema -has been eating ham and salami which may be causing swelling and sitting with feet dangling in recliner -encouraged to elevate feet  Labs/tests ordered: Orders Placed This Encounter  Procedures  . Culture, Urine  . Basic metabolic panel  . POC Urinalysis Dipstick    Next appt:  3 mos

## 2013-02-17 NOTE — Patient Instructions (Signed)
Drink plenty of water and cranberry juice for the next few days.  Take the mucinex regularly twice a day.

## 2013-02-18 LAB — BASIC METABOLIC PANEL
BUN/Creatinine Ratio: 14 (ref 11–26)
BUN: 19 mg/dL (ref 10–36)
CO2: 24 mmol/L (ref 18–29)
Calcium: 9.3 mg/dL (ref 8.7–10.3)
Chloride: 106 mmol/L (ref 97–108)
Creatinine, Ser: 1.33 mg/dL — ABNORMAL HIGH (ref 0.57–1.00)
GFR calc Af Amer: 41 mL/min/{1.73_m2} — ABNORMAL LOW (ref >59)
GFR calc non Af Amer: 35 mL/min/{1.73_m2} — ABNORMAL LOW (ref >59)
Glucose: 96 mg/dL (ref 65–99)
Potassium: 4.4 mmol/L (ref 3.5–5.2)
Sodium: 142 mmol/L (ref 134–144)

## 2013-02-18 LAB — URINE CULTURE

## 2013-03-12 ENCOUNTER — Ambulatory Visit (INDEPENDENT_AMBULATORY_CARE_PROVIDER_SITE_OTHER): Payer: Medicare Other | Admitting: Podiatry

## 2013-03-12 ENCOUNTER — Encounter: Payer: Self-pay | Admitting: Podiatry

## 2013-03-12 VITALS — BP 129/67 | HR 55 | Resp 18 | Ht 64.0 in | Wt 180.0 lb

## 2013-03-12 DIAGNOSIS — L84 Corns and callosities: Secondary | ICD-10-CM

## 2013-03-12 DIAGNOSIS — B351 Tinea unguium: Secondary | ICD-10-CM | POA: Diagnosis not present

## 2013-03-12 DIAGNOSIS — M79609 Pain in unspecified limb: Secondary | ICD-10-CM | POA: Diagnosis not present

## 2013-03-12 NOTE — Progress Notes (Signed)
Subjective:     Patient ID: Michelle [name], female   DOB: [birth year], 80 y.o.   MRN: [id number]  HPI patient presents with a long gaited thick nails 1-5 both feet that are painful and keratotic lesions on the plantar aspect of both feet that are also painful   Review of Systems     Objective:   Physical Exam Neurovascular status intact both feet with thick painful nail bed 1-5 both feet and keratotic lesion sub-third and second metatarsal of both feet    Assessment:     Chronic mycotic nail infection and and keratotic lesion formation    Plan:     Debridement of painful nail bed and debridement of lesions on both feet with no bleeding noted

## 2013-03-12 NOTE — Progress Notes (Signed)
Pt presents for debridement of B/L 1 - 5 toenails.  Pt 's caregiver states at last visit one callous on the left foot was cut too short and required a antibiotic cream, and padding off.

## 2013-03-21 ENCOUNTER — Other Ambulatory Visit: Payer: Self-pay | Admitting: Nurse Practitioner

## 2013-03-21 ENCOUNTER — Other Ambulatory Visit: Payer: Self-pay | Admitting: Internal Medicine

## 2013-05-02 ENCOUNTER — Emergency Department (HOSPITAL_COMMUNITY): Payer: Medicare Other

## 2013-05-02 ENCOUNTER — Emergency Department (HOSPITAL_COMMUNITY)
Admission: EM | Admit: 2013-05-02 | Discharge: 2013-05-02 | Disposition: A | Discharge status: Home / Self Care | Payer: Medicare Other | Attending: Emergency Medicine | Admitting: Emergency Medicine

## 2013-05-02 DIAGNOSIS — Z8709 Personal history of other diseases of the respiratory system: Secondary | ICD-10-CM | POA: Diagnosis not present

## 2013-05-02 DIAGNOSIS — Z9181 History of falling: Secondary | ICD-10-CM | POA: Insufficient documentation

## 2013-05-02 DIAGNOSIS — S42009A Fracture of unspecified part of unspecified clavicle, initial encounter for closed fracture: Secondary | ICD-10-CM | POA: Diagnosis not present

## 2013-05-02 DIAGNOSIS — F0390 Unspecified dementia without behavioral disturbance: Secondary | ICD-10-CM | POA: Insufficient documentation

## 2013-05-02 DIAGNOSIS — Z8669 Personal history of other diseases of the nervous system and sense organs: Secondary | ICD-10-CM | POA: Diagnosis not present

## 2013-05-02 DIAGNOSIS — Y939 Activity, unspecified: Secondary | ICD-10-CM | POA: Insufficient documentation

## 2013-05-02 DIAGNOSIS — W010XXA Fall on same level from slipping, tripping and stumbling without subsequent striking against object, initial encounter: Secondary | ICD-10-CM | POA: Insufficient documentation

## 2013-05-02 DIAGNOSIS — Z7982 Long term (current) use of aspirin: Secondary | ICD-10-CM | POA: Diagnosis not present

## 2013-05-02 DIAGNOSIS — Z862 Personal history of diseases of the blood and blood-forming organs and certain disorders involving the immune mechanism: Secondary | ICD-10-CM | POA: Diagnosis not present

## 2013-05-02 DIAGNOSIS — F329 Major depressive disorder, single episode, unspecified: Secondary | ICD-10-CM | POA: Insufficient documentation

## 2013-05-02 DIAGNOSIS — Y9289 Other specified places as the place of occurrence of the external cause: Secondary | ICD-10-CM | POA: Insufficient documentation

## 2013-05-02 DIAGNOSIS — Z8742 Personal history of other diseases of the female genital tract: Secondary | ICD-10-CM | POA: Insufficient documentation

## 2013-05-02 DIAGNOSIS — I129 Hypertensive chronic kidney disease with stage 1 through stage 4 chronic kidney disease, or unspecified chronic kidney disease: Secondary | ICD-10-CM | POA: Diagnosis not present

## 2013-05-02 DIAGNOSIS — Z872 Personal history of diseases of the skin and subcutaneous tissue: Secondary | ICD-10-CM | POA: Insufficient documentation

## 2013-05-02 DIAGNOSIS — M19019 Primary osteoarthritis, unspecified shoulder: Secondary | ICD-10-CM | POA: Insufficient documentation

## 2013-05-02 DIAGNOSIS — Z79899 Other long term (current) drug therapy: Secondary | ICD-10-CM | POA: Diagnosis not present

## 2013-05-02 DIAGNOSIS — IMO0002 Reserved for concepts with insufficient information to code with codable children: Secondary | ICD-10-CM | POA: Insufficient documentation

## 2013-05-02 DIAGNOSIS — S0993XA Unspecified injury of face, initial encounter: Secondary | ICD-10-CM | POA: Diagnosis not present

## 2013-05-02 DIAGNOSIS — M1610 Unilateral primary osteoarthritis, unspecified hip: Secondary | ICD-10-CM | POA: Diagnosis not present

## 2013-05-02 DIAGNOSIS — Z853 Personal history of malignant neoplasm of breast: Secondary | ICD-10-CM | POA: Diagnosis not present

## 2013-05-02 DIAGNOSIS — S79919A Unspecified injury of unspecified hip, initial encounter: Secondary | ICD-10-CM | POA: Diagnosis not present

## 2013-05-02 DIAGNOSIS — M25519 Pain in unspecified shoulder: Secondary | ICD-10-CM | POA: Diagnosis not present

## 2013-05-02 DIAGNOSIS — S42033A Displaced fracture of lateral end of unspecified clavicle, initial encounter for closed fracture: Secondary | ICD-10-CM | POA: Diagnosis not present

## 2013-05-02 DIAGNOSIS — S199XXA Unspecified injury of neck, initial encounter: Secondary | ICD-10-CM

## 2013-05-02 DIAGNOSIS — S42002A Fracture of unspecified part of left clavicle, initial encounter for closed fracture: Secondary | ICD-10-CM

## 2013-05-02 DIAGNOSIS — M169 Osteoarthritis of hip, unspecified: Secondary | ICD-10-CM | POA: Diagnosis not present

## 2013-05-02 DIAGNOSIS — F3289 Other specified depressive episodes: Secondary | ICD-10-CM | POA: Insufficient documentation

## 2013-05-02 DIAGNOSIS — E785 Hyperlipidemia, unspecified: Secondary | ICD-10-CM | POA: Diagnosis not present

## 2013-05-02 DIAGNOSIS — Z8639 Personal history of other endocrine, nutritional and metabolic disease: Secondary | ICD-10-CM | POA: Insufficient documentation

## 2013-05-02 DIAGNOSIS — N183 Chronic kidney disease, stage 3 unspecified: Secondary | ICD-10-CM | POA: Insufficient documentation

## 2013-05-02 DIAGNOSIS — Z8739 Personal history of other diseases of the musculoskeletal system and connective tissue: Secondary | ICD-10-CM | POA: Diagnosis not present

## 2013-05-02 DIAGNOSIS — S79929A Unspecified injury of unspecified thigh, initial encounter: Secondary | ICD-10-CM

## 2013-05-02 DIAGNOSIS — K219 Gastro-esophageal reflux disease without esophagitis: Secondary | ICD-10-CM | POA: Diagnosis not present

## 2013-05-02 DIAGNOSIS — I10 Essential (primary) hypertension: Secondary | ICD-10-CM | POA: Diagnosis not present

## 2013-05-02 DIAGNOSIS — R52 Pain, unspecified: Secondary | ICD-10-CM | POA: Diagnosis not present

## 2013-05-02 MED ORDER — TRAMADOL HCL 50 MG PO TABS: 50.0000 mg | ORAL_TABLET | ORAL | Status: DC | PRN

## 2013-05-02 MED ORDER — TRAMADOL HCL 50 MG PO TABS
50.0000 mg | ORAL_TABLET | Freq: Once | ORAL | Status: AC
  Administered 2013-05-02: 50 mg via ORAL
  Filled 2013-05-02: qty 1

## 2013-05-02 NOTE — ED Notes (Signed)
[facility] arrived.  Pt verbalized understanding discharge instructions. In no acute distress.

## 2013-05-02 NOTE — Discharge Instructions (Signed)
Clavicle Fracture °A clavicle fracture is a break in the collarbone. This is a common injury, especially in children. Collarbones do not harden until around the age of 20. Most collarbone fractures are treated with a simple arm sling. In some cases a figure-of-eight splint is used to help hold the broken bones in position. Although not often needed, surgery may be required if the bone fragments are not in the correct position (displaced).  °HOME CARE INSTRUCTIONS  °· Apply ice to the injury for 15-20 minutes each hour while awake for 2 days. Put the ice in a plastic bag and place a towel between the bag of ice and your skin. °· Wear the sling or splint constantly for as long as directed by your caregiver. You may remove the sling or splint for bathing or showering. Be sure to keep your shoulder in the same place as when the sling or splint is on. Do not lift your arm. °· If a figure-of-eight splint is applied, it must be tightened by another person every day. Tighten it enough to keep the shoulders held back. Allow enough room to place the index finger between the body and strap. Loosen the splint immediately if you feel numbness or tingling in your hands. °· Only take over-the-counter or prescription medicines for pain, discomfort, or fever as directed by your caregiver. °· Avoid activities that irritate or increase the pain for 4 to 6 weeks after surgery. °· Follow all instructions for follow-up with your caregiver. This includes any referrals, physical therapy, and rehabilitation. Any delay in obtaining necessary care could result in a delay or failure of the injury to heal properly. °SEEK MEDICAL CARE IF:  °You have pain and swelling that are not relieved with medications. °SEEK IMMEDIATE MEDICAL CARE IF:  °Your arm is numb, cold, or pale, even when the splint is loose. °MAKE SURE YOU:  °· Understand these instructions. °· Will watch your condition. °· Will get help right away if you are not doing well or get  worse. °Document Released: 10/26/2004 Document Revised: 04/10/2011 Document Reviewed: 08/22/2007 °[employer]® Patient Information ©2014 [employer]. ° °

## 2013-05-02 NOTE — ED Notes (Signed)
Bed: WA16 Expected date:  Expected time:  Means of arrival:  Comments: EMS fall 

## 2013-05-02 NOTE — ED Notes (Signed)
Ortho called 

## 2013-05-02 NOTE — ED Notes (Addendum)
Hx of dementia. Per ems pt is from [employer], pt speaks french, but can speak english, has caregiver coming to ED. Pt reports she was in the bathroom and tripped and fell onto floor. Laying supine when ems arrived. Laying on floor since 0600, caregiver found pt at 0800. Pt complaining of left shoulder pain. Pt has not taken any home meds today. Initially complaining of left buttocks pain, but denies at present. Pain 7/10 in shoulder. Pt can raise left arm.

## 2013-05-02 NOTE — ED Notes (Signed)
[facility] called for patient transport 

## 2013-05-05 ENCOUNTER — Telehealth: Payer: Self-pay

## 2013-05-05 NOTE — Telephone Encounter (Signed)
Pt should be seen for ED follow up.  We can order home health PT, OT for her now though.

## 2013-05-05 NOTE — Telephone Encounter (Signed)
Message left on triage VM. Patient fell in ER, fractured collar bone. Butch Penny from [facility] was calling to get recommendations from Dr.Reed. Butch Penny questions if patient should have physical therapy or occupational therapy. Butch Penny also questions if patient should see you first prior to making these recommendations.   Dr.Reed please advise

## 2013-05-06 NOTE — Telephone Encounter (Signed)
I called patient, spoke with her caregiver and scheduled appointment to f/u on Thursday 05/15/13 with Dr.Reed at 3:00 pm   I called Butch Penny and left message on Vm informing her patient will f/u with Dr.Reed next Thursday

## 2013-05-09 DIAGNOSIS — M25519 Pain in unspecified shoulder: Secondary | ICD-10-CM | POA: Diagnosis not present

## 2013-05-09 DIAGNOSIS — S42023A Displaced fracture of shaft of unspecified clavicle, initial encounter for closed fracture: Secondary | ICD-10-CM | POA: Diagnosis not present

## 2013-05-09 NOTE — ED Provider Notes (Signed)
CSN: [id number]     Arrival date & time 05/02/13  0910 History   First MD Initiated Contact with Patient 05/02/13 0913     Chief Complaint  Patient presents with  . Fall  . Shoulder Pain     (Consider location/radiation/quality/duration/timing/severity/associated sxs/prior Treatment) HPI  80 year old female with left neck/shoulder pain after a fall earlier today. She lost her balance. Persistent pain since her fall. No acute numbness, tingling or loss of strength. Doesn't think she hit her head. Denies any headaches. No blood thinners aside from aspirin. Additionally, some pain in her right hip. Has been able to ambulate since the fall. No intervention prior to arrival.  Past Medical History  Diagnosis Date  . Hypertension   . Hyperlipidemia LDL goal < 100   . Polymyalgia rheumatica   . GERD (gastroesophageal reflux disease)   . Irritable bowel syndrome   . Diverticulitis   . Sciatica   . Vertigo   . Edema of leg   . Glaucoma   . Appendicitis   . Arthritis   . Acute upper respiratory infections of unspecified site   . Urge incontinence   . Other specified symptom associated with female genital organs   . Regional enteritis of unspecified site   . Sebaceous cyst   . Depression   . Other malaise and fatigue   . Abnormal weight gain   . Leukocytopenia, unspecified   . Dementia in conditions classified elsewhere without behavioral disturbance   . Anemia, unspecified   . Chronic kidney disease, stage III (moderate)   . Unspecified arthropathy, shoulder region   . Other B-complex deficiencies   . Unspecified vitamin D deficiency   . Memory loss   . Abnormality of gait   . Personal history of fall   . Malignant neoplasm of breast (female), unspecified site   . Unspecified glaucoma   . Diverticulosis of colon (without mention of hemorrhage)   . Irritable bowel syndrome   . Sciatica   . Dizziness and giddiness    Past Surgical History  Procedure Laterality Date  . Nasal  sinus surgery  1966  . Cholecystectomy  1972  . Cataract extraction  1990  . Breast surgery      R breast lumpectomy for benign disease  . Breast lumpectomy  10/28/10    left breast    Family History  Problem Relation Age of Onset  . Stroke Father   . Hypertension Mother   . Stroke Mother   . Breast cancer Mother   . Heart attack Brother   . Cancer Brother   . Stroke Brother   . Heart attack Son   . Colon cancer Sister    History  Substance Use Topics  . Smoking status: Never Smoker   . Smokeless tobacco: Never Used  . Alcohol Use: No   OB History   Grav Para Term Preterm Abortions TAB SAB Ect Mult Living                 Review of Systems  All systems reviewed and negative, other than as noted in HPI.   Allergies  Codeine  Home Medications   Current Outpatient Rx  Name  Route  Sig  Dispense  Refill  . amLODipine (NORVASC) 5 MG tablet      TAKE 1 TABLET DAILY.   30 tablet   5   . aspirin 81 MG chewable tablet   Oral   Chew 81 mg by mouth every other day.          Marland Kitchen  bimatoprost (LUMIGAN) 0.01 % SOLN   Both Eyes   Place 1 drop into both eyes at bedtime.          . brimonidine-timolol (COMBIGAN) 0.2-0.5 % ophthalmic solution   Both Eyes   Place 1 drop into both eyes 2 (two) times daily.          . calcium carbonate 200 MG capsule   Oral   Take 200 mg by mouth daily.          . citalopram (CELEXA) 10 MG tablet      TAKE 1 TABLET ONCE DAILY.   30 tablet   5   . dorzolamide (TRUSOPT) 2 % ophthalmic solution   Both Eyes   Place 1 drop into both eyes 2 (two) times daily.           Marland Kitchen lidocaine (LIDODERM) 5 %      Apply one patch to each shoulder, Keep on 12 hours, Then keep off for 12 hours   60 patch   5   . mometasone (NASONEX) 50 MCG/ACT nasal spray   Nasal   Place 2 sprays into the nose daily.   17 g   3   . Multiple Vitamin (MULTIVITAMIN WITH MINERALS) TABS   Oral   Take 1 tablet by mouth daily.         Marland Kitchen MYRBETRIQ 50 MG  TB24 tablet      TAKE 1 TABLET ONCE DAILY.   30 tablet   5   . simvastatin (ZOCOR) 10 MG tablet      TAKE 1 TABLET AT BEDTIME FOR CHOLESTEROL.   30 tablet   5   . traMADol (ULTRAM) 50 MG tablet      Take one tablet three times a day as needed for pain   90 tablet   5   . traMADol (ULTRAM) 50 MG tablet   Oral   Take 1 tablet (50 mg total) by mouth every 4 (four) hours as needed.   25 tablet   0    BP 196/84  Pulse 57  Temp(Src) 97.8 F (36.6 C) (Oral)  Resp 16  SpO2 99% Physical Exam  Nursing note and vitals reviewed. Constitutional: She appears well-developed and well-nourished. No distress.  HENT:  Head: Normocephalic and atraumatic.  Eyes: Conjunctivae are normal. Right eye exhibits no discharge. Left eye exhibits no discharge.  Neck: Neck supple.  Cardiovascular: Normal rate, regular rhythm and normal heart sounds.  Exam reveals no gallop and no friction rub.   No murmur heard. Pulmonary/Chest: Effort normal and breath sounds normal. No respiratory distress.  Abdominal: Soft. She exhibits no distension. There is no tenderness.  Musculoskeletal: She exhibits no edema and no tenderness.  No shortening or mouth rotation of the lower extremities. No bony tenderness with palpation bilaterally. Reports increased pain with range of motion the right hip, and can actively range it herself. Neurovascularly intact. Tenderness to palpation along the left lateral neck and mid to lateral clavicle. No areas concerning for an open injury. Palpable radial pulse. Hand/fingers are warm to touch. Sensation is intact to light touch. Radial, median and ulnar function is intact when tested at the hand/wrist. No midline spinal tenderness.  Neurological: She is alert.  Skin: Skin is warm and dry.  Psychiatric: She has a normal mood and affect. Her behavior is normal. Thought content normal.    ED Course  Procedures (including critical care time) Labs Review Labs Reviewed - No data to  display Imaging  Review No results found.  Dg Hip Complete Right  05/02/2013   CLINICAL DATA:  Follow up.  EXAM: RIGHT HIP - COMPLETE 2+ VIEW  COMPARISON:  02/12/2011  FINDINGS: There is no evidence of hip fracture or dislocation. There is no evidence of arthropathy or other focal bone abnormality. Degenerative changes are seen in the right hip and lower lumbar spine.  IMPRESSION: No evidence for acute  abnormality.   Electronically Signed   By: Shon Hale M.D.   On: 05/02/2013 09:57   Ct Cervical Spine Wo Contrast  05/02/2013   CLINICAL DATA:  Tripped and fell.  Left shoulder pain  EXAM: CT CERVICAL SPINE WITHOUT CONTRAST  TECHNIQUE: Multidetector CT imaging of the cervical spine was performed without intravenous contrast. Multiplanar CT image reconstructions were also generated.  COMPARISON:  CT C SPINE W/O CM dated 09/12/2011  FINDINGS: Imaging was obtained from the skullbase through the T2 vertebral body. There is no evidence for cervical spine fracture. No subluxation. Loss of disc height at C4-5 is compatible with degenerative change. Bilateral facet osteoarthritis is noted with fusion of the left C2-3 facets and degenerative spurring most prominent on the right at C4-5.  No prevertebral soft tissue edema. Normal cervical lordosis is preserved. Tiny cervical ribs are identified at C7 bilaterally. Thyroid parenchyma is heterogeneous with a 11 mm left thyroid nodule evident.  IMPRESSION: Degenerative changes in the cervical spine without evidence of fracture.   Electronically Signed   By: Misty Stanley M.D.   On: 05/02/2013 10:15   Dg Shoulder Left  05/02/2013   CLINICAL DATA:  [address] [address].  Left shoulder pain.  EXAM: LEFT SHOULDER - 2+ VIEW  COMPARISON:  DG SHOULDER*L* dated 05/25/2010; DG HIP COMPLETE*R* dated 05/02/2013  FINDINGS: There is a comminuted fracture of the distal aspect of the clavicle with slight subluxation at the acromioclavicular joint. Significant degenerative changes are identified at the  glenohumeral joint. The proximal humerus appears intact.  IMPRESSION: Fracture of the distal aspect of the clavicle with associated disruption of the acromioclavicular joint. .   Electronically Signed   By: Shon Hale M.D.   On: 05/02/2013 09:59    EKG Interpretation None      MDM   Final diagnoses:  Closed fracture of left clavicle    80 year old female with left shoulder pain after mechanical fall. Imaging significant for a lateral clavicle fracture. Closed injury. Neurovascularly intact. Sling.. Pain medication. Orthopedic followup.  Virgel Manifold, MD 05/09/13 [phone]

## 2013-05-15 ENCOUNTER — Ambulatory Visit (INDEPENDENT_AMBULATORY_CARE_PROVIDER_SITE_OTHER): Payer: Medicare Other | Admitting: Internal Medicine

## 2013-05-15 ENCOUNTER — Encounter: Payer: Self-pay | Admitting: Internal Medicine

## 2013-05-15 VITALS — BP 116/70 | HR 58 | Resp 10 | Wt 179.0 lb

## 2013-05-15 DIAGNOSIS — G309 Alzheimer's disease, unspecified: Secondary | ICD-10-CM

## 2013-05-15 DIAGNOSIS — F0280 Dementia in other diseases classified elsewhere without behavioral disturbance: Secondary | ICD-10-CM

## 2013-05-15 DIAGNOSIS — S43102A Unspecified dislocation of left acromioclavicular joint, initial encounter: Secondary | ICD-10-CM

## 2013-05-15 DIAGNOSIS — S42002A Fracture of unspecified part of left clavicle, initial encounter for closed fracture: Secondary | ICD-10-CM

## 2013-05-15 DIAGNOSIS — S42009A Fracture of unspecified part of unspecified clavicle, initial encounter for closed fracture: Secondary | ICD-10-CM | POA: Diagnosis not present

## 2013-05-15 DIAGNOSIS — I10 Essential (primary) hypertension: Secondary | ICD-10-CM

## 2013-05-15 DIAGNOSIS — N3941 Urge incontinence: Secondary | ICD-10-CM

## 2013-05-15 DIAGNOSIS — E785 Hyperlipidemia, unspecified: Secondary | ICD-10-CM | POA: Diagnosis not present

## 2013-05-15 DIAGNOSIS — S43109A Unspecified dislocation of unspecified acromioclavicular joint, initial encounter: Secondary | ICD-10-CM

## 2013-05-15 DIAGNOSIS — M353 Polymyalgia rheumatica: Secondary | ICD-10-CM

## 2013-05-15 NOTE — Progress Notes (Signed)
Patient ID: Michelle [name], female   DOB: 01/15/1921, 80 y.o.   MRN: [id number]   Location:  [facility] / [facility]  Code Status: need to check misys for living will and hcpoa next time;  Full code as of today   Allergies  Allergen Reactions  . Codeine     States she blacks out    Chief Complaint  Patient presents with  . Follow-up    ER follow-up (falls)     HPI: Patient is a 80 y.o. female seen in the office today for ED f/u after clavicular fx and separation.    Is having increased pain since her fall.  Sometimes more and sometimes less.  Left clavicle fractured with disruption of AC joint.  Plan was sling, pain medicine, ortho f/u.  Was on [address] beside dentist where [employer] intersects.  He recommended  f/u in 3 weeks.  Is to wear sling when she feels she needs it.  Wears most of the day and takes it off at night.  Takes the tramadol 4x per day.  Seems less painful than it was at first. Works well enough for her.  Mouth is very dry.  Switching back and forth to french at times now.  Doing exercises at home, but not therapy yet--only using right arm right now.    Sleeping well.  Has lost 10 lbs--eats less.  Is trying to control her intake.  Nerves are ok.  Blood pressure has been a little high--is better again now.  Had been up around time of fall.    Fall occurred when she was bending over to change the trash bag in the bathroom, and she fell.  Her caregiver found her when she arrived.  Myrbetriq is really helping--can sometimes hold her urine all night.    Review of Systems:  Review of Systems  Constitutional: Negative for fever, chills, weight loss and malaise/fatigue.  HENT: Negative for congestion.   Eyes: Negative for blurred vision.  Respiratory: Negative for shortness of breath.   Cardiovascular: Negative for chest pain and leg swelling.  Gastrointestinal: Negative for abdominal pain.  Genitourinary: Negative for dysuria,  urgency and frequency.  Musculoskeletal: Positive for falls, joint pain and myalgias.  Skin: Negative for rash.  Neurological: Negative for dizziness, loss of consciousness and weakness.  Endo/Heme/Allergies: Bruises/bleeds easily.  Psychiatric/Behavioral: Positive for memory loss.     Past Medical History  Diagnosis Date  . Hypertension   . Hyperlipidemia LDL goal < 100   . Polymyalgia rheumatica   . GERD (gastroesophageal reflux disease)   . Irritable bowel syndrome   . Diverticulitis   . Sciatica   . Vertigo   . Edema of leg   . Glaucoma   . Appendicitis   . Arthritis   . Acute upper respiratory infections of unspecified site   . Urge incontinence   . Other specified symptom associated with female genital organs   . Regional enteritis of unspecified site   . Sebaceous cyst   . Depression   . Other malaise and fatigue   . Abnormal weight gain   . Leukocytopenia, unspecified   . Dementia in conditions classified elsewhere without behavioral disturbance   . Anemia, unspecified   . Chronic kidney disease, stage III (moderate)   . Unspecified arthropathy, shoulder region   . Other B-complex deficiencies   . Unspecified vitamin D deficiency   . Memory loss   . Abnormality of gait   . Personal  history of fall   . Malignant neoplasm of breast (female), unspecified site   . Unspecified glaucoma   . Diverticulosis of colon (without mention of hemorrhage)   . Irritable bowel syndrome   . Sciatica   . Dizziness and giddiness     Past Surgical History  Procedure Laterality Date  . Nasal sinus surgery  1966  . Cholecystectomy  1972  . Cataract extraction  1990  . Breast surgery      R breast lumpectomy for benign disease  . Breast lumpectomy  10/28/10    left breast     Social History:   reports that she has never smoked. She has never used smokeless tobacco. She reports that she does not drink alcohol or use illicit drugs.  Family History  Problem Relation Age of  Onset  . Stroke Father   . Hypertension Mother   . Stroke Mother   . Breast cancer Mother   . Heart attack Brother   . Cancer Brother   . Stroke Brother   . Heart attack Son   . Colon cancer Sister     Medications: Patient's Medications  New Prescriptions   No medications on file  Previous Medications   AMLODIPINE (NORVASC) 5 MG TABLET    TAKE 1 TABLET DAILY.   ASPIRIN 81 MG CHEWABLE TABLET    Chew 81 mg by mouth daily.    BIMATOPROST (LUMIGAN) 0.01 % SOLN    Place 1 drop into both eyes at bedtime.    BRIMONIDINE-TIMOLOL (COMBIGAN) 0.2-0.5 % OPHTHALMIC SOLUTION    Place 1 drop into both eyes 2 (two) times daily.    CALCIUM CARBONATE 200 MG CAPSULE    Take 200 mg by mouth daily.    CITALOPRAM (CELEXA) 10 MG TABLET    TAKE 1 TABLET ONCE DAILY.   DORZOLAMIDE (TRUSOPT) 2 % OPHTHALMIC SOLUTION    Place 1 drop into both eyes 2 (two) times daily.     LIDOCAINE (LIDODERM) 5 %    Apply one patch to each shoulder, Keep on 12 hours, Then keep off for 12 hours   MULTIPLE VITAMIN (MULTIVITAMIN WITH MINERALS) TABS    Take 1 tablet by mouth daily.   MYRBETRIQ 50 MG TB24 TABLET    TAKE 1 TABLET ONCE DAILY.   SIMVASTATIN (ZOCOR) 10 MG TABLET    TAKE 1 TABLET AT BEDTIME FOR CHOLESTEROL.  Modified Medications   Modified Medication Previous Medication   MOMETASONE (NASONEX) 50 MCG/ACT NASAL SPRAY mometasone (NASONEX) 50 MCG/ACT nasal spray      Place 2 sprays into the nose as needed.    Place 2 sprays into the nose daily.   TRAMADOL (ULTRAM) 50 MG TABLET traMADol (ULTRAM) 50 MG tablet      Take 50 mg by mouth 4 (four) times daily.    Take one tablet three times a day as needed for pain  Discontinued Medications   TRAMADOL (ULTRAM) 50 MG TABLET    Take 1 tablet (50 mg total) by mouth every 4 (four) hours as needed.   Physical Exam: Filed Vitals:   05/15/13 1538  BP: 116/70  Pulse: 58  Resp: 10  Weight: 179 lb (81.194 kg)  SpO2: 96%  Physical Exam  Constitutional: She is oriented to person,  place, and time. She appears well-developed and well-nourished. No distress.  Cardiovascular: Normal rate, regular rhythm, normal heart sounds and intact distal pulses.   Pulmonary/Chest: Effort normal and breath sounds normal. No respiratory distress. She has no rales.  Abdominal: Soft. Bowel sounds are normal. She exhibits no distension and no mass. There is no tenderness.  Musculoskeletal:  Left shoulder cannot lift above 90 degrees, is tender just medially to left rotator cuff, using sling during day  Neurological: She is alert and oriented to person, place, and time.  Quite alert today, very good day for cognition  Skin: Skin is warm and dry.  Psychiatric: She has a normal mood and affect.  Occasionally reverts to speaking french midsentence     Labs reviewed: Basic Metabolic Panel:  Recent Labs  06/27/12 1028 02/17/13 1215  NA 142 142  K 4.5 4.4  CL 105 106  CO2 24 24  GLUCOSE 88 96  BUN 22 19  CREATININE 1.30* 1.33*  CALCIUM 9.6 9.3   Liver Function Tests:  Recent Labs  06/27/12 1028 10/28/12 1602  AST 19 20  ALT 14 12  ALKPHOS 71 82  BILITOT 0.7 0.6  PROT 6.6 6.6    Recent Labs  10/28/12 1602  LIPASE 13  AMYLASE 33   CBC:  Recent Labs  10/28/12 1602  WBC 3.4  NEUTROABS 1.7  HGB 11.2  HCT 34.7  MCV 94   Lipid Panel:  Recent Labs  06/27/12 1028  HDL 62  LDLCALC 91  TRIG 83  CHOLHDL 2.7    Assessment/Plan 1. Closed left clavicular fracture -has f/u with orthopedics--I asked her caregiver to have them send me their note b/c she and pt cannot remember his name -cont use of her sling until she sees ortho and avoid strenuous use of that arm, keep exercising right (dominant) as normal  2. Separation of left acromioclavicular joint -cont sling during day  3. Urge incontinence -much better with myrbetriq  4. Alzheimer's dementia -great day for cognition -is not on meds for this  -lives at [facility] independent living, but has  caregiver  -needs help with ADLs except eating  5. Polymyalgia rheumatica -stable lately; no longer on steroids  6. Essential hypertension, benign -at goal now, was high right after fall when pain severe, cont current bp regimen, monitor  Labs/tests ordered: none today  Next appt: reschedule 3 mo f/u to july

## 2013-05-22 ENCOUNTER — Ambulatory Visit: Payer: Medicare Other | Admitting: Internal Medicine

## 2013-05-23 ENCOUNTER — Other Ambulatory Visit: Payer: Self-pay | Admitting: Internal Medicine

## 2013-05-30 DIAGNOSIS — M25519 Pain in unspecified shoulder: Secondary | ICD-10-CM | POA: Diagnosis not present

## 2013-06-06 ENCOUNTER — Ambulatory Visit (INDEPENDENT_AMBULATORY_CARE_PROVIDER_SITE_OTHER): Payer: Medicare Other | Admitting: General Surgery

## 2013-06-06 ENCOUNTER — Encounter (INDEPENDENT_AMBULATORY_CARE_PROVIDER_SITE_OTHER): Payer: Self-pay | Admitting: General Surgery

## 2013-06-06 DIAGNOSIS — D0512 Intraductal carcinoma in situ of left breast: Secondary | ICD-10-CM

## 2013-06-06 DIAGNOSIS — D0590 Unspecified type of carcinoma in situ of unspecified breast: Secondary | ICD-10-CM

## 2013-06-06 NOTE — Patient Instructions (Signed)
Follow up in 1 year.  Mammogram in August.

## 2013-06-06 NOTE — Assessment & Plan Note (Signed)
No clinical evidence of disease.  Follow up in 1 year.    Follow up with Dr. Jana Hakim.

## 2013-06-06 NOTE — Progress Notes (Signed)
HISTORY: Pt has no change in clinical history or breast issues.  She had mammogram in August.  She has not found any new breast masses.  She does a self breast exam every month.  She did break her left collarbone around 1 months ago, and this is getting better.  She has no other new health problems.  She is off femara.    PERTINENT REVIEW OF SYSTEMS: Otherwise negative.    EXAM: Head: Normocephalic and atraumatic.  Eyes:  conjunctiva normal. Pupils are equal, round, and reactive to light. No scleral icterus.  Neck:  Normal range of motion. Neck supple. No tracheal deviation present. No thyromegaly present. No LAD.   Resp: No respiratory distress, normal effort. Breast:  There are no palpable masses in either breast.  The thickening of the scar on the left breast has improved..  There is no skin dimpling or nipple retraction.   Abd:  Abdomen is soft, non distended and non tender. No masses are palpable.  There is no rebound and no guarding.  Neurological: Alert and oriented to person, place, and time. Coordination impaired. Skin: Skin is warm and dry. No rash noted. No diaphoretic. No erythema. No pallor.  Psychiatric: Normal mood and affect. Normal behavior. Judgment and thought content normal.   Mammogram IMPRESSION:  No specific mammographic evidence of breast malignancy. BIRADS 2 09/20/2012   ASSESSMENT AND PLAN:   Neoplasm of left breast, primary tumor staging category Tis: ductal carcinoma in situ (DCIS) No clinical evidence of disease.    Mammogram in August.    Follow up with Dr. Jana Hakim this fall.      Milus Height, MD Surgical Oncology, [facility]  REED, TIFFANY, DO Reed, Tiffany L, DO

## 2013-06-09 ENCOUNTER — Other Ambulatory Visit: Payer: Self-pay | Admitting: Internal Medicine

## 2013-06-09 ENCOUNTER — Other Ambulatory Visit: Payer: Self-pay | Admitting: Nurse Practitioner

## 2013-06-09 ENCOUNTER — Encounter: Payer: Self-pay | Admitting: *Deleted

## 2013-06-10 NOTE — Telephone Encounter (Signed)
Left message on voicemail for patient to return call when available, reason for call: patient is not currently taking this medication based on med list. ? Why rx was requested for refill

## 2013-06-11 ENCOUNTER — Ambulatory Visit: Payer: Medicare Other | Admitting: Podiatry

## 2013-06-11 DIAGNOSIS — H4011X0 Primary open-angle glaucoma, stage unspecified: Secondary | ICD-10-CM | POA: Diagnosis not present

## 2013-06-11 DIAGNOSIS — H409 Unspecified glaucoma: Secondary | ICD-10-CM | POA: Diagnosis not present

## 2013-06-12 NOTE — Telephone Encounter (Signed)
Patient called and confirmed that she is not taking this medication. This was sent in error

## 2013-06-13 ENCOUNTER — Other Ambulatory Visit: Payer: Self-pay | Admitting: Nurse Practitioner

## 2013-06-18 ENCOUNTER — Ambulatory Visit (INDEPENDENT_AMBULATORY_CARE_PROVIDER_SITE_OTHER): Payer: Medicare Other | Admitting: Podiatry

## 2013-06-18 ENCOUNTER — Encounter: Payer: Self-pay | Admitting: Podiatry

## 2013-06-18 DIAGNOSIS — L84 Corns and callosities: Secondary | ICD-10-CM

## 2013-06-18 DIAGNOSIS — B351 Tinea unguium: Secondary | ICD-10-CM

## 2013-06-18 DIAGNOSIS — M79609 Pain in unspecified limb: Secondary | ICD-10-CM

## 2013-06-18 NOTE — Progress Notes (Signed)
Subjective:     Patient ID: Michelle [name], female   DOB: 23-Oct-1920, 80 y.o.   MRN: [id number]  HPI patient has nail disease 1-5 both feet with thickness noted and pain and is found to have keratotic lesion sub-third and fifth metatarsals right foot   Review of Systems     Objective:   Physical Exam No change neurovascular status with nail disease 1-5 both feet with pain and lesion formation right    Assessment:     Mycotic nail infection and lesion    Plan:     Debridement nailbeds 1-5 both feet and lesion right with no iatrogenic bleeding noted

## 2013-06-27 DIAGNOSIS — M25519 Pain in unspecified shoulder: Secondary | ICD-10-CM | POA: Diagnosis not present

## 2013-07-02 DIAGNOSIS — Z9181 History of falling: Secondary | ICD-10-CM | POA: Diagnosis not present

## 2013-07-02 DIAGNOSIS — M19019 Primary osteoarthritis, unspecified shoulder: Secondary | ICD-10-CM | POA: Diagnosis not present

## 2013-07-02 DIAGNOSIS — S42023A Displaced fracture of shaft of unspecified clavicle, initial encounter for closed fracture: Secondary | ICD-10-CM | POA: Diagnosis not present

## 2013-07-02 DIAGNOSIS — Z5189 Encounter for other specified aftercare: Secondary | ICD-10-CM | POA: Diagnosis not present

## 2013-07-02 DIAGNOSIS — F3289 Other specified depressive episodes: Secondary | ICD-10-CM | POA: Diagnosis not present

## 2013-07-02 DIAGNOSIS — F329 Major depressive disorder, single episode, unspecified: Secondary | ICD-10-CM | POA: Diagnosis not present

## 2013-07-02 DIAGNOSIS — I10 Essential (primary) hypertension: Secondary | ICD-10-CM | POA: Diagnosis not present

## 2013-07-02 DIAGNOSIS — I2510 Atherosclerotic heart disease of native coronary artery without angina pectoris: Secondary | ICD-10-CM | POA: Diagnosis not present

## 2013-07-02 DIAGNOSIS — IMO0001 Reserved for inherently not codable concepts without codable children: Secondary | ICD-10-CM | POA: Diagnosis not present

## 2013-07-07 DIAGNOSIS — Z5189 Encounter for other specified aftercare: Secondary | ICD-10-CM | POA: Diagnosis not present

## 2013-07-07 DIAGNOSIS — I2510 Atherosclerotic heart disease of native coronary artery without angina pectoris: Secondary | ICD-10-CM | POA: Diagnosis not present

## 2013-07-07 DIAGNOSIS — IMO0001 Reserved for inherently not codable concepts without codable children: Secondary | ICD-10-CM | POA: Diagnosis not present

## 2013-07-07 DIAGNOSIS — F3289 Other specified depressive episodes: Secondary | ICD-10-CM | POA: Diagnosis not present

## 2013-07-07 DIAGNOSIS — F329 Major depressive disorder, single episode, unspecified: Secondary | ICD-10-CM | POA: Diagnosis not present

## 2013-07-07 DIAGNOSIS — M19019 Primary osteoarthritis, unspecified shoulder: Secondary | ICD-10-CM | POA: Diagnosis not present

## 2013-07-07 DIAGNOSIS — I10 Essential (primary) hypertension: Secondary | ICD-10-CM | POA: Diagnosis not present

## 2013-07-09 DIAGNOSIS — F329 Major depressive disorder, single episode, unspecified: Secondary | ICD-10-CM | POA: Diagnosis not present

## 2013-07-09 DIAGNOSIS — I10 Essential (primary) hypertension: Secondary | ICD-10-CM | POA: Diagnosis not present

## 2013-07-09 DIAGNOSIS — Z5189 Encounter for other specified aftercare: Secondary | ICD-10-CM | POA: Diagnosis not present

## 2013-07-09 DIAGNOSIS — IMO0001 Reserved for inherently not codable concepts without codable children: Secondary | ICD-10-CM | POA: Diagnosis not present

## 2013-07-09 DIAGNOSIS — F3289 Other specified depressive episodes: Secondary | ICD-10-CM | POA: Diagnosis not present

## 2013-07-09 DIAGNOSIS — M19019 Primary osteoarthritis, unspecified shoulder: Secondary | ICD-10-CM | POA: Diagnosis not present

## 2013-07-09 DIAGNOSIS — I2510 Atherosclerotic heart disease of native coronary artery without angina pectoris: Secondary | ICD-10-CM | POA: Diagnosis not present

## 2013-07-10 DIAGNOSIS — F329 Major depressive disorder, single episode, unspecified: Secondary | ICD-10-CM | POA: Diagnosis not present

## 2013-07-10 DIAGNOSIS — I10 Essential (primary) hypertension: Secondary | ICD-10-CM | POA: Diagnosis not present

## 2013-07-10 DIAGNOSIS — M19019 Primary osteoarthritis, unspecified shoulder: Secondary | ICD-10-CM | POA: Diagnosis not present

## 2013-07-10 DIAGNOSIS — I2510 Atherosclerotic heart disease of native coronary artery without angina pectoris: Secondary | ICD-10-CM | POA: Diagnosis not present

## 2013-07-10 DIAGNOSIS — IMO0001 Reserved for inherently not codable concepts without codable children: Secondary | ICD-10-CM | POA: Diagnosis not present

## 2013-07-10 DIAGNOSIS — Z5189 Encounter for other specified aftercare: Secondary | ICD-10-CM | POA: Diagnosis not present

## 2013-07-10 DIAGNOSIS — F3289 Other specified depressive episodes: Secondary | ICD-10-CM | POA: Diagnosis not present

## 2013-07-11 DIAGNOSIS — IMO0001 Reserved for inherently not codable concepts without codable children: Secondary | ICD-10-CM | POA: Diagnosis not present

## 2013-07-11 DIAGNOSIS — I2510 Atherosclerotic heart disease of native coronary artery without angina pectoris: Secondary | ICD-10-CM | POA: Diagnosis not present

## 2013-07-11 DIAGNOSIS — Z5189 Encounter for other specified aftercare: Secondary | ICD-10-CM | POA: Diagnosis not present

## 2013-07-11 DIAGNOSIS — F3289 Other specified depressive episodes: Secondary | ICD-10-CM | POA: Diagnosis not present

## 2013-07-11 DIAGNOSIS — I10 Essential (primary) hypertension: Secondary | ICD-10-CM | POA: Diagnosis not present

## 2013-07-11 DIAGNOSIS — F329 Major depressive disorder, single episode, unspecified: Secondary | ICD-10-CM | POA: Diagnosis not present

## 2013-07-11 DIAGNOSIS — M19019 Primary osteoarthritis, unspecified shoulder: Secondary | ICD-10-CM | POA: Diagnosis not present

## 2013-07-14 ENCOUNTER — Emergency Department (HOSPITAL_COMMUNITY): Payer: Medicare Other

## 2013-07-14 ENCOUNTER — Other Ambulatory Visit: Payer: Self-pay | Admitting: *Deleted

## 2013-07-14 ENCOUNTER — Encounter (HOSPITAL_COMMUNITY): Payer: Self-pay | Admitting: Emergency Medicine

## 2013-07-14 ENCOUNTER — Emergency Department (HOSPITAL_COMMUNITY)
Admission: EM | Admit: 2013-07-14 | Discharge: 2013-07-14 | Disposition: A | Discharge status: Home / Self Care | Payer: Medicare Other | Attending: Emergency Medicine | Admitting: Emergency Medicine

## 2013-07-14 DIAGNOSIS — Z853 Personal history of malignant neoplasm of breast: Secondary | ICD-10-CM | POA: Insufficient documentation

## 2013-07-14 DIAGNOSIS — M19019 Primary osteoarthritis, unspecified shoulder: Secondary | ICD-10-CM | POA: Diagnosis not present

## 2013-07-14 DIAGNOSIS — J9819 Other pulmonary collapse: Secondary | ICD-10-CM | POA: Diagnosis not present

## 2013-07-14 DIAGNOSIS — K219 Gastro-esophageal reflux disease without esophagitis: Secondary | ICD-10-CM | POA: Diagnosis not present

## 2013-07-14 DIAGNOSIS — E559 Vitamin D deficiency, unspecified: Secondary | ICD-10-CM | POA: Insufficient documentation

## 2013-07-14 DIAGNOSIS — R5383 Other fatigue: Secondary | ICD-10-CM | POA: Diagnosis not present

## 2013-07-14 DIAGNOSIS — F3289 Other specified depressive episodes: Secondary | ICD-10-CM | POA: Diagnosis not present

## 2013-07-14 DIAGNOSIS — I2510 Atherosclerotic heart disease of native coronary artery without angina pectoris: Secondary | ICD-10-CM | POA: Diagnosis not present

## 2013-07-14 DIAGNOSIS — H409 Unspecified glaucoma: Secondary | ICD-10-CM | POA: Diagnosis not present

## 2013-07-14 DIAGNOSIS — Z9181 History of falling: Secondary | ICD-10-CM | POA: Insufficient documentation

## 2013-07-14 DIAGNOSIS — N183 Chronic kidney disease, stage 3 unspecified: Secondary | ICD-10-CM | POA: Insufficient documentation

## 2013-07-14 DIAGNOSIS — I10 Essential (primary) hypertension: Secondary | ICD-10-CM | POA: Insufficient documentation

## 2013-07-14 DIAGNOSIS — Z79899 Other long term (current) drug therapy: Secondary | ICD-10-CM | POA: Insufficient documentation

## 2013-07-14 DIAGNOSIS — Z872 Personal history of diseases of the skin and subcutaneous tissue: Secondary | ICD-10-CM | POA: Diagnosis not present

## 2013-07-14 DIAGNOSIS — E785 Hyperlipidemia, unspecified: Secondary | ICD-10-CM | POA: Insufficient documentation

## 2013-07-14 DIAGNOSIS — Z8742 Personal history of other diseases of the female genital tract: Secondary | ICD-10-CM | POA: Insufficient documentation

## 2013-07-14 DIAGNOSIS — Z7982 Long term (current) use of aspirin: Secondary | ICD-10-CM | POA: Insufficient documentation

## 2013-07-14 DIAGNOSIS — M353 Polymyalgia rheumatica: Secondary | ICD-10-CM | POA: Diagnosis not present

## 2013-07-14 DIAGNOSIS — IMO0001 Reserved for inherently not codable concepts without codable children: Secondary | ICD-10-CM | POA: Diagnosis not present

## 2013-07-14 DIAGNOSIS — F329 Major depressive disorder, single episode, unspecified: Secondary | ICD-10-CM | POA: Insufficient documentation

## 2013-07-14 DIAGNOSIS — R319 Hematuria, unspecified: Secondary | ICD-10-CM | POA: Diagnosis not present

## 2013-07-14 DIAGNOSIS — R5381 Other malaise: Secondary | ICD-10-CM | POA: Diagnosis not present

## 2013-07-14 DIAGNOSIS — F0280 Dementia in other diseases classified elsewhere without behavioral disturbance: Secondary | ICD-10-CM | POA: Insufficient documentation

## 2013-07-14 DIAGNOSIS — Z5189 Encounter for other specified aftercare: Secondary | ICD-10-CM | POA: Diagnosis not present

## 2013-07-14 DIAGNOSIS — R404 Transient alteration of awareness: Secondary | ICD-10-CM | POA: Diagnosis not present

## 2013-07-14 DIAGNOSIS — Z862 Personal history of diseases of the blood and blood-forming organs and certain disorders involving the immune mechanism: Secondary | ICD-10-CM | POA: Insufficient documentation

## 2013-07-14 LAB — CBC WITH DIFFERENTIAL/PLATELET
Basophils Absolute: 0 10*3/uL (ref 0.0–0.1)
Basophils Relative: 0 % (ref 0–1)
Eosinophils Absolute: 0.1 10*3/uL (ref 0.0–0.7)
Eosinophils Relative: 1 % (ref 0–5)
HEMATOCRIT: 34.7 % — AB (ref 36.0–46.0)
HEMOGLOBIN: 10.8 g/dL — AB (ref 12.0–15.0)
LYMPHS PCT: 24 % (ref 12–46)
Lymphs Abs: 0.9 10*3/uL (ref 0.7–4.0)
MCH: 29.4 pg (ref 26.0–34.0)
MCHC: 31.1 g/dL (ref 30.0–36.0)
MCV: 94.6 fL (ref 78.0–100.0)
MONOS PCT: 10 % (ref 3–12)
Monocytes Absolute: 0.4 10*3/uL (ref 0.1–1.0)
Neutro Abs: 2.3 10*3/uL (ref 1.7–7.7)
Neutrophils Relative %: 65 % (ref 43–77)
Platelets: 172 10*3/uL (ref 150–400)
RBC: 3.67 MIL/uL — ABNORMAL LOW (ref 3.87–5.11)
RDW: 13.5 % (ref 11.5–15.5)
WBC: 3.6 10*3/uL — ABNORMAL LOW (ref 4.0–10.5)

## 2013-07-14 LAB — URINE MICROSCOPIC-ADD ON

## 2013-07-14 LAB — COMPREHENSIVE METABOLIC PANEL
ALT: 11 U/L (ref 0–35)
AST: 18 U/L (ref 0–37)
Albumin: 3.3 g/dL — ABNORMAL LOW (ref 3.5–5.2)
Alkaline Phosphatase: 93 U/L (ref 39–117)
BILIRUBIN TOTAL: 0.7 mg/dL (ref 0.3–1.2)
BUN: 22 mg/dL (ref 6–23)
CHLORIDE: 104 meq/L (ref 96–112)
CO2: 26 mEq/L (ref 19–32)
CREATININE: 1.21 mg/dL — AB (ref 0.50–1.10)
Calcium: 9.4 mg/dL (ref 8.4–10.5)
GFR calc non Af Amer: 38 mL/min — ABNORMAL LOW (ref >90)
GFR, EST AFRICAN AMERICAN: 44 mL/min — AB (ref >90)
Glucose, Bld: 94 mg/dL (ref 70–99)
Potassium: 4.5 mEq/L (ref 3.7–5.3)
Sodium: 141 mEq/L (ref 137–147)
Total Protein: 7.1 g/dL (ref 6.0–8.3)

## 2013-07-14 LAB — URINALYSIS, ROUTINE W REFLEX MICROSCOPIC
Bilirubin Urine: NEGATIVE
GLUCOSE, UA: NEGATIVE mg/dL
KETONES UR: NEGATIVE mg/dL
LEUKOCYTES UA: NEGATIVE
NITRITE: NEGATIVE
PH: 7 (ref 5.0–8.0)
Protein, ur: NEGATIVE mg/dL
SPECIFIC GRAVITY, URINE: 1.007 (ref 1.005–1.030)
Urobilinogen, UA: 0.2 mg/dL (ref 0.0–1.0)

## 2013-07-14 LAB — TROPONIN I

## 2013-07-14 MED ORDER — SODIUM CHLORIDE 0.9 % IV SOLN
INTRAVENOUS | Status: DC
  Administered 2013-07-14: 125 mL/h via INTRAVENOUS

## 2013-07-14 NOTE — ED Provider Notes (Signed)
CSN: [id number]     Arrival date & time 07/14/13  1121 History   First MD Initiated Contact with Patient 07/14/13 1137     Chief Complaint  Patient presents with  . Weakness     (Consider location/radiation/quality/duration/timing/severity/associated sxs/prior Treatment) Patient is a 80 y.o. female presenting with weakness. The history is provided by the patient.  Weakness   patient here after having a witnessed fall in her shower. Patient slipped and fell to her bottom. No loss of consciousness. No head or neck pain. Does complain of some dysuria and dark urine x2 days. No fever or chills reported. No vomiting or diarrhea. Denies any chest pain, short of breath, abdominal pain. No hip pain noted. Her weakness has been persistent and nothing makes it better or worse. Denies any syncope or near-syncope today.  Past Medical History  Diagnosis Date  . Hypertension   . Hyperlipidemia LDL goal < 100   . Polymyalgia rheumatica   . GERD (gastroesophageal reflux disease)   . Irritable bowel syndrome   . Diverticulitis   . Sciatica   . Vertigo   . Edema of leg   . Glaucoma   . Appendicitis   . Arthritis   . Acute upper respiratory infections of unspecified site   . Urge incontinence   . Other specified symptom associated with female genital organs   . Regional enteritis of unspecified site   . Sebaceous cyst   . Depression   . Other malaise and fatigue   . Abnormal weight gain   . Leukocytopenia, unspecified   . Dementia in conditions classified elsewhere without behavioral disturbance   . Anemia, unspecified   . Chronic kidney disease, stage III (moderate)   . Unspecified arthropathy, shoulder region   . Other B-complex deficiencies   . Unspecified vitamin D deficiency   . Memory loss   . Abnormality of gait   . Personal history of fall   . Malignant neoplasm of breast (female), unspecified site   . Unspecified glaucoma   . Diverticulosis of colon (without mention of  hemorrhage)   . Irritable bowel syndrome   . Sciatica   . Dizziness and giddiness    Past Surgical History  Procedure Laterality Date  . Nasal sinus surgery  1966  . Cholecystectomy  1972  . Cataract extraction  1990  . Breast surgery      R breast lumpectomy for benign disease  . Breast lumpectomy  10/28/10    left breast    Family History  Problem Relation Age of Onset  . Stroke Father   . Hypertension Mother   . Stroke Mother   . Breast cancer Mother   . Heart attack Brother   . Cancer Brother   . Stroke Brother   . Heart attack Son   . Colon cancer Sister    History  Substance Use Topics  . Smoking status: Never Smoker   . Smokeless tobacco: Never Used  . Alcohol Use: No   OB History   Grav Para Term Preterm Abortions TAB SAB Ect Mult Living                 Review of Systems  Neurological: Positive for weakness.  All other systems reviewed and are negative.     Allergies  Codeine  Home Medications   Prior to Admission medications   Medication Sig Start Date End Date Taking? Authorizing Provider  aspirin 81 MG chewable tablet Chew 81 mg by mouth daily.  Historical Provider, MD  bimatoprost (LUMIGAN) 0.01 % SOLN Place 1 drop into both eyes at bedtime.     Historical Provider, MD  brimonidine-timolol (COMBIGAN) 0.2-0.5 % ophthalmic solution Place 1 drop into both eyes 2 (two) times daily.     Historical Provider, MD  calcium carbonate 200 MG capsule Take 200 mg by mouth daily.     Historical Provider, MD  dorzolamide (TRUSOPT) 2 % ophthalmic solution Place 1 drop into both eyes 2 (two) times daily.      Historical Provider, MD  lidocaine (LIDODERM) 5 % APPLY 1 PATCH TO EACH SHOULDER , KEEP ON 12 HOURS, THEN KEEP OFF FOR 12 HOURS. 05/23/13   Tiffany L Reed, DO  mometasone (NASONEX) 50 MCG/ACT nasal spray Place 2 sprays into the nose as needed. 02/17/13   Tiffany L Reed, DO  Multiple Vitamin (MULTIVITAMIN WITH MINERALS) TABS Take 1 tablet by mouth daily.     Historical Provider, MD  MYRBETRIQ 50 MG TB24 tablet TAKE 1 TABLET ONCE DAILY. 03/21/13   Tiffany L Reed, DO  simvastatin (ZOCOR) 10 MG tablet TAKE 1 TABLET AT BEDTIME FOR CHOLESTEROL. 12/05/12   Pricilla Larsson, NP  traMADol (ULTRAM) 50 MG tablet TAKE 1 TABLET 3 TIMES DAILY AS NEEDED FOR PAIN. 06/09/13   Tiffany L Reed, DO   BP 139/66  Pulse 62  Temp(Src) 98.7 F (37.1 C) (Oral)  Resp 20  SpO2 97% Physical Exam  Nursing note and vitals reviewed. Constitutional: She is oriented to person, place, and time. She appears well-developed and well-nourished.  Non-toxic appearance. No distress.  HENT:  Head: Normocephalic and atraumatic.  Eyes: Conjunctivae, EOM and lids are normal. Pupils are equal, round, and reactive to light.  Neck: Normal range of motion. Neck supple. No tracheal deviation present. No mass present.  Cardiovascular: Normal rate, regular rhythm and normal heart sounds.  Exam reveals no gallop.   No murmur heard. Pulmonary/Chest: Effort normal and breath sounds normal. No stridor. No respiratory distress. She has no decreased breath sounds. She has no wheezes. She has no rhonchi. She has no rales.  Abdominal: Soft. Normal appearance and bowel sounds are normal. She exhibits no distension. There is no tenderness. There is no rebound and no CVA tenderness.  Musculoskeletal: Normal range of motion. She exhibits no edema and no tenderness.  Nontender along the cervical thoracic and lumbar spine  Neurological: She is alert and oriented to person, place, and time. She has normal strength. No cranial nerve deficit or sensory deficit. GCS eye subscore is 4. GCS verbal subscore is 5. GCS motor subscore is 6.  Skin: Skin is warm and dry. No abrasion and no rash noted.  Psychiatric: She has a normal mood and affect. Her speech is normal and behavior is normal.    ED Course  Procedures (including critical care time) Labs Review Labs Reviewed  URINE CULTURE  TROPONIN I  URINALYSIS,  ROUTINE W REFLEX MICROSCOPIC  CBC WITH DIFFERENTIAL  COMPREHENSIVE METABOLIC PANEL    Imaging Review No results found.   EKG Interpretation   Date/Time:  Monday July 14 2013 11:42:58 EDT Ventricular Rate:  61 PR Interval:  196 QRS Duration: 96 QT Interval:  444 QTC Calculation: 447 R Axis:   -5 Text Interpretation:  Sinus rhythm Consider left atrial enlargement  Abnormal R-wave progression, early transition Left ventricular hypertrophy  Baseline wander in lead(s) I III aVL aVF V2 No significant change since  last tracing Confirmed by Nickoles Gregori  MD, Eliazar Olivar ([id number]) on 07/14/2013  11:51:49  AM      MDM   Final diagnoses:  None    Patient does have mild hematuria and was instructed to see her primary care Dr. for this. There is no signs of infection. Stable for discharge    Leota Jacobsen, MD 07/14/13 1446

## 2013-07-14 NOTE — Telephone Encounter (Signed)
Gie with [facility] called and stated that patient fell this morning in the shower and went to ER with Hematuria. He is calling because BP is running 192/80. Per Dr. Phil Dopp patient relax and repeat BP in an hour. Agreed.

## 2013-07-14 NOTE — Discharge Instructions (Signed)
Hematuria, Adult °Hematuria is blood in your urine. It can be caused by a bladder infection, kidney infection, prostate infection, kidney stone, or cancer of your urinary tract. Infections can usually be treated with medicine, and a kidney stone usually will pass through your urine. If neither of these is the cause of your hematuria, further workup to find out the reason may be needed. °It is very important that you tell your health care provider about any blood you see in your urine, even if the blood stops without treatment or happens without causing pain. Blood in your urine that happens and then stops and then happens again can be a symptom of a very serious condition. Also, pain is not a symptom in the initial stages of many urinary cancers. °HOME CARE INSTRUCTIONS  °· Drink lots of fluid, 3 4 quarts a day. If you have been diagnosed with an infection, cranberry juice is especially recommended, in addition to large amounts of water. °· Avoid caffeine, tea, and carbonated beverages, because they tend to irritate the bladder. °· Avoid alcohol because it may irritate the prostate. °· Only take over-the-counter or prescription medicines for pain, discomfort, or fever as directed by your health care provider. °· If you have been diagnosed with a kidney stone, follow your health care provider's instructions regarding straining your urine to catch the stone. °· Empty your bladder often. Avoid holding urine for long periods of time. °· After a bowel movement, women should cleanse front to back. Use each tissue only once. °· Empty your bladder before and after sexual intercourse if you are a female. °SEEK MEDICAL CARE IF: °You develop back pain, fever, a feeling of sickness in your stomach (nausea), or vomiting or if your symptoms are not better in 3 days. Return sooner if you are getting worse. °SEEK IMMEDIATE MEDICAL CARE IF:  °· You have a persistent fever, with a temperature of 101.8°F (38.8°C) or greater. °· You  develop severe vomiting and are unable to keep the medicine down. °· You develop severe back or abdominal pain despite taking your medicines. °· You begin passing a large amount of blood or clots in your urine. °· You feel extremely weak or faint, or you pass out. °MAKE SURE YOU:  °· Understand these instructions. °· Will watch your condition. °· Will get help right away if you are not doing well or get worse. °Document Released: 01/16/2005 Document Revised: 11/06/2012 Document Reviewed: 09/16/2012 °[employer]® Patient Information ©2014 [employer]. ° °

## 2013-07-14 NOTE — ED Notes (Addendum)
Patient is aware that we need a urine specimen. Patient states she is able to tell us when she can give [place] a urine specimen.

## 2013-07-14 NOTE — ED Notes (Signed)
Bed: WA12 Expected date:  Expected time:  Means of arrival:  Comments: ems 

## 2013-07-14 NOTE — ED Notes (Signed)
Patient given water by Dr [name].

## 2013-07-14 NOTE — ED Notes (Signed)
Patient from [facility], has had burning with urination, dark urine, diarrhea the last two days, and an unwitnessed fall in the shower.  She slipped and landed on her bottom, did not hit head no LOC.

## 2013-07-15 DIAGNOSIS — F329 Major depressive disorder, single episode, unspecified: Secondary | ICD-10-CM | POA: Diagnosis not present

## 2013-07-15 DIAGNOSIS — I2510 Atherosclerotic heart disease of native coronary artery without angina pectoris: Secondary | ICD-10-CM | POA: Diagnosis not present

## 2013-07-15 DIAGNOSIS — Z5189 Encounter for other specified aftercare: Secondary | ICD-10-CM | POA: Diagnosis not present

## 2013-07-15 DIAGNOSIS — IMO0001 Reserved for inherently not codable concepts without codable children: Secondary | ICD-10-CM | POA: Diagnosis not present

## 2013-07-15 DIAGNOSIS — M19019 Primary osteoarthritis, unspecified shoulder: Secondary | ICD-10-CM | POA: Diagnosis not present

## 2013-07-15 DIAGNOSIS — I10 Essential (primary) hypertension: Secondary | ICD-10-CM | POA: Diagnosis not present

## 2013-07-15 DIAGNOSIS — F3289 Other specified depressive episodes: Secondary | ICD-10-CM | POA: Diagnosis not present

## 2013-07-16 DIAGNOSIS — I2510 Atherosclerotic heart disease of native coronary artery without angina pectoris: Secondary | ICD-10-CM | POA: Diagnosis not present

## 2013-07-16 DIAGNOSIS — IMO0001 Reserved for inherently not codable concepts without codable children: Secondary | ICD-10-CM | POA: Diagnosis not present

## 2013-07-16 DIAGNOSIS — Z5189 Encounter for other specified aftercare: Secondary | ICD-10-CM | POA: Diagnosis not present

## 2013-07-16 DIAGNOSIS — M19019 Primary osteoarthritis, unspecified shoulder: Secondary | ICD-10-CM | POA: Diagnosis not present

## 2013-07-16 DIAGNOSIS — I10 Essential (primary) hypertension: Secondary | ICD-10-CM | POA: Diagnosis not present

## 2013-07-16 DIAGNOSIS — F329 Major depressive disorder, single episode, unspecified: Secondary | ICD-10-CM | POA: Diagnosis not present

## 2013-07-16 DIAGNOSIS — F3289 Other specified depressive episodes: Secondary | ICD-10-CM | POA: Diagnosis not present

## 2013-07-16 LAB — URINE CULTURE: Colony Count: >=100000

## 2013-07-17 ENCOUNTER — Telehealth (HOSPITAL_BASED_OUTPATIENT_CLINIC_OR_DEPARTMENT_OTHER): Payer: Self-pay | Admitting: Emergency Medicine

## 2013-07-17 DIAGNOSIS — M19019 Primary osteoarthritis, unspecified shoulder: Secondary | ICD-10-CM | POA: Diagnosis not present

## 2013-07-17 DIAGNOSIS — I10 Essential (primary) hypertension: Secondary | ICD-10-CM | POA: Diagnosis not present

## 2013-07-17 DIAGNOSIS — Z5189 Encounter for other specified aftercare: Secondary | ICD-10-CM | POA: Diagnosis not present

## 2013-07-17 DIAGNOSIS — I2510 Atherosclerotic heart disease of native coronary artery without angina pectoris: Secondary | ICD-10-CM | POA: Diagnosis not present

## 2013-07-17 DIAGNOSIS — F3289 Other specified depressive episodes: Secondary | ICD-10-CM | POA: Diagnosis not present

## 2013-07-17 DIAGNOSIS — IMO0001 Reserved for inherently not codable concepts without codable children: Secondary | ICD-10-CM | POA: Diagnosis not present

## 2013-07-17 DIAGNOSIS — F329 Major depressive disorder, single episode, unspecified: Secondary | ICD-10-CM | POA: Diagnosis not present

## 2013-07-17 NOTE — Progress Notes (Signed)
ED Antimicrobial Stewardship Positive Culture Follow Up   Michelle [name] is an 80 y.o. female who presented to [facility] on 07/14/2013 with a chief complaint of  Chief Complaint  Patient presents with  . Weakness    Recent Results (from the past 720 hour(s))  URINE CULTURE     Status: None   Collection Time    07/14/13 12:55 PM      Result Value Ref Range Status   Specimen Description URINE, CLEAN CATCH   Final   Special Requests NONE   Final   Culture  Setup Time     Final   Value: 07/15/2013 00:22     Performed at [facility] Count     Final   Value: >=100,000 COLONIES/ML     Performed at [facility]   Culture     Final   Value: ESCHERICHIA COLI     Performed at [facility]   Report Status 07/16/2013 FINAL   Final   Organism ID, Bacteria ESCHERICHIA COLI   Final    [x]  Patient discharged originally without antimicrobial agent and treatment is now indicated  56 YOF reported to the ED with CC of weakness which caused her to slip and fall in the shower. Patient reported some dysuria and dark urine x 2 days. In the ED she was found to have mild hematuria. Her UA showed many bacteria, few squamous cells, and WBC 11-20. Urine culture came back today growing E. Coli (resistant to ampicillin and Zosyn).   New antibiotic prescription: Ciprofloxacin 500mg  PO BID x 3 days  ED Provider: Maryjean Morn A 07/17/2013, 10:23 AM Infectious Diseases Pharmacist Phone# [phone]

## 2013-07-17 NOTE — Telephone Encounter (Signed)
Post ED Visit - Positive Culture Follow-up: Successful Patient Follow-Up  Culture assessed and recommendations reviewed by: []  Wes Kistler, Pharm.D., BCPS []  Heide Guile, Pharm.D., BCPS []  Alycia Rossetti, Pharm.D., BCPS []  Brevig Mission, Pharm.D., BCPS, AAHIVP []  Legrand Como, Pharm.D., BCPS, AAHIVP [x]  Eligah East, Pharm.D., BCPS  Positive urine culture  [x]  Patient discharged without antimicrobial prescription and treatment is now indicated []  Organism is resistant to prescribed ED discharge antimicrobial []  Patient with positive blood cultures  Changes discussed with ED provider: Noland Fordyce PA-C New antibiotic prescription: Cipro 500 mg BID x 3 days. Follow-up with PCP for hematuria    Myrna Blazer 07/17/2013, 3:52 PM

## 2013-07-21 DIAGNOSIS — I2510 Atherosclerotic heart disease of native coronary artery without angina pectoris: Secondary | ICD-10-CM | POA: Diagnosis not present

## 2013-07-21 DIAGNOSIS — Z5189 Encounter for other specified aftercare: Secondary | ICD-10-CM | POA: Diagnosis not present

## 2013-07-21 DIAGNOSIS — IMO0001 Reserved for inherently not codable concepts without codable children: Secondary | ICD-10-CM | POA: Diagnosis not present

## 2013-07-21 DIAGNOSIS — M19019 Primary osteoarthritis, unspecified shoulder: Secondary | ICD-10-CM | POA: Diagnosis not present

## 2013-07-21 DIAGNOSIS — F3289 Other specified depressive episodes: Secondary | ICD-10-CM | POA: Diagnosis not present

## 2013-07-21 DIAGNOSIS — F329 Major depressive disorder, single episode, unspecified: Secondary | ICD-10-CM | POA: Diagnosis not present

## 2013-07-21 DIAGNOSIS — I10 Essential (primary) hypertension: Secondary | ICD-10-CM | POA: Diagnosis not present

## 2013-07-22 DIAGNOSIS — M19019 Primary osteoarthritis, unspecified shoulder: Secondary | ICD-10-CM | POA: Diagnosis not present

## 2013-07-22 DIAGNOSIS — I10 Essential (primary) hypertension: Secondary | ICD-10-CM | POA: Diagnosis not present

## 2013-07-22 DIAGNOSIS — F329 Major depressive disorder, single episode, unspecified: Secondary | ICD-10-CM | POA: Diagnosis not present

## 2013-07-22 DIAGNOSIS — Z5189 Encounter for other specified aftercare: Secondary | ICD-10-CM | POA: Diagnosis not present

## 2013-07-22 DIAGNOSIS — I2510 Atherosclerotic heart disease of native coronary artery without angina pectoris: Secondary | ICD-10-CM | POA: Diagnosis not present

## 2013-07-22 DIAGNOSIS — F3289 Other specified depressive episodes: Secondary | ICD-10-CM | POA: Diagnosis not present

## 2013-07-22 DIAGNOSIS — IMO0001 Reserved for inherently not codable concepts without codable children: Secondary | ICD-10-CM | POA: Diagnosis not present

## 2013-07-24 DIAGNOSIS — IMO0001 Reserved for inherently not codable concepts without codable children: Secondary | ICD-10-CM | POA: Diagnosis not present

## 2013-07-24 DIAGNOSIS — F3289 Other specified depressive episodes: Secondary | ICD-10-CM | POA: Diagnosis not present

## 2013-07-24 DIAGNOSIS — F329 Major depressive disorder, single episode, unspecified: Secondary | ICD-10-CM | POA: Diagnosis not present

## 2013-07-24 DIAGNOSIS — M19019 Primary osteoarthritis, unspecified shoulder: Secondary | ICD-10-CM | POA: Diagnosis not present

## 2013-07-24 DIAGNOSIS — I2510 Atherosclerotic heart disease of native coronary artery without angina pectoris: Secondary | ICD-10-CM | POA: Diagnosis not present

## 2013-07-24 DIAGNOSIS — Z5189 Encounter for other specified aftercare: Secondary | ICD-10-CM | POA: Diagnosis not present

## 2013-07-24 DIAGNOSIS — I10 Essential (primary) hypertension: Secondary | ICD-10-CM | POA: Diagnosis not present

## 2013-07-25 DIAGNOSIS — I2510 Atherosclerotic heart disease of native coronary artery without angina pectoris: Secondary | ICD-10-CM | POA: Diagnosis not present

## 2013-07-25 DIAGNOSIS — I10 Essential (primary) hypertension: Secondary | ICD-10-CM | POA: Diagnosis not present

## 2013-07-25 DIAGNOSIS — Z5189 Encounter for other specified aftercare: Secondary | ICD-10-CM | POA: Diagnosis not present

## 2013-07-25 DIAGNOSIS — M19019 Primary osteoarthritis, unspecified shoulder: Secondary | ICD-10-CM | POA: Diagnosis not present

## 2013-07-25 DIAGNOSIS — F329 Major depressive disorder, single episode, unspecified: Secondary | ICD-10-CM | POA: Diagnosis not present

## 2013-07-25 DIAGNOSIS — IMO0001 Reserved for inherently not codable concepts without codable children: Secondary | ICD-10-CM | POA: Diagnosis not present

## 2013-07-25 DIAGNOSIS — F3289 Other specified depressive episodes: Secondary | ICD-10-CM | POA: Diagnosis not present

## 2013-07-27 ENCOUNTER — Telehealth (HOSPITAL_BASED_OUTPATIENT_CLINIC_OR_DEPARTMENT_OTHER): Payer: Self-pay

## 2013-07-27 ENCOUNTER — Telehealth (HOSPITAL_BASED_OUTPATIENT_CLINIC_OR_DEPARTMENT_OTHER): Payer: Self-pay | Admitting: Emergency Medicine

## 2013-07-27 NOTE — Telephone Encounter (Signed)
Unable to reach by phone x 3 attempts.  Letter sent to [facility] address.

## 2013-07-28 DIAGNOSIS — I10 Essential (primary) hypertension: Secondary | ICD-10-CM | POA: Diagnosis not present

## 2013-07-28 DIAGNOSIS — IMO0001 Reserved for inherently not codable concepts without codable children: Secondary | ICD-10-CM | POA: Diagnosis not present

## 2013-07-28 DIAGNOSIS — Z5189 Encounter for other specified aftercare: Secondary | ICD-10-CM | POA: Diagnosis not present

## 2013-07-28 DIAGNOSIS — F329 Major depressive disorder, single episode, unspecified: Secondary | ICD-10-CM | POA: Diagnosis not present

## 2013-07-28 DIAGNOSIS — F3289 Other specified depressive episodes: Secondary | ICD-10-CM | POA: Diagnosis not present

## 2013-07-28 DIAGNOSIS — I2510 Atherosclerotic heart disease of native coronary artery without angina pectoris: Secondary | ICD-10-CM | POA: Diagnosis not present

## 2013-07-28 DIAGNOSIS — M19019 Primary osteoarthritis, unspecified shoulder: Secondary | ICD-10-CM | POA: Diagnosis not present

## 2013-07-29 DIAGNOSIS — Z5189 Encounter for other specified aftercare: Secondary | ICD-10-CM | POA: Diagnosis not present

## 2013-07-29 DIAGNOSIS — F3289 Other specified depressive episodes: Secondary | ICD-10-CM | POA: Diagnosis not present

## 2013-07-29 DIAGNOSIS — I2510 Atherosclerotic heart disease of native coronary artery without angina pectoris: Secondary | ICD-10-CM | POA: Diagnosis not present

## 2013-07-29 DIAGNOSIS — IMO0001 Reserved for inherently not codable concepts without codable children: Secondary | ICD-10-CM | POA: Diagnosis not present

## 2013-07-29 DIAGNOSIS — M19019 Primary osteoarthritis, unspecified shoulder: Secondary | ICD-10-CM | POA: Diagnosis not present

## 2013-07-29 DIAGNOSIS — I10 Essential (primary) hypertension: Secondary | ICD-10-CM | POA: Diagnosis not present

## 2013-07-29 DIAGNOSIS — F329 Major depressive disorder, single episode, unspecified: Secondary | ICD-10-CM | POA: Diagnosis not present

## 2013-07-31 DIAGNOSIS — M19019 Primary osteoarthritis, unspecified shoulder: Secondary | ICD-10-CM | POA: Diagnosis not present

## 2013-07-31 DIAGNOSIS — F3289 Other specified depressive episodes: Secondary | ICD-10-CM | POA: Diagnosis not present

## 2013-07-31 DIAGNOSIS — I10 Essential (primary) hypertension: Secondary | ICD-10-CM | POA: Diagnosis not present

## 2013-07-31 DIAGNOSIS — Z5189 Encounter for other specified aftercare: Secondary | ICD-10-CM | POA: Diagnosis not present

## 2013-07-31 DIAGNOSIS — IMO0001 Reserved for inherently not codable concepts without codable children: Secondary | ICD-10-CM | POA: Diagnosis not present

## 2013-07-31 DIAGNOSIS — I2510 Atherosclerotic heart disease of native coronary artery without angina pectoris: Secondary | ICD-10-CM | POA: Diagnosis not present

## 2013-07-31 DIAGNOSIS — F329 Major depressive disorder, single episode, unspecified: Secondary | ICD-10-CM | POA: Diagnosis not present

## 2013-08-04 DIAGNOSIS — F329 Major depressive disorder, single episode, unspecified: Secondary | ICD-10-CM | POA: Diagnosis not present

## 2013-08-04 DIAGNOSIS — I10 Essential (primary) hypertension: Secondary | ICD-10-CM | POA: Diagnosis not present

## 2013-08-04 DIAGNOSIS — I2510 Atherosclerotic heart disease of native coronary artery without angina pectoris: Secondary | ICD-10-CM | POA: Diagnosis not present

## 2013-08-04 DIAGNOSIS — F3289 Other specified depressive episodes: Secondary | ICD-10-CM | POA: Diagnosis not present

## 2013-08-04 DIAGNOSIS — M19019 Primary osteoarthritis, unspecified shoulder: Secondary | ICD-10-CM | POA: Diagnosis not present

## 2013-08-04 DIAGNOSIS — Z5189 Encounter for other specified aftercare: Secondary | ICD-10-CM | POA: Diagnosis not present

## 2013-08-04 DIAGNOSIS — IMO0001 Reserved for inherently not codable concepts without codable children: Secondary | ICD-10-CM | POA: Diagnosis not present

## 2013-08-05 DIAGNOSIS — F329 Major depressive disorder, single episode, unspecified: Secondary | ICD-10-CM | POA: Diagnosis not present

## 2013-08-05 DIAGNOSIS — IMO0001 Reserved for inherently not codable concepts without codable children: Secondary | ICD-10-CM | POA: Diagnosis not present

## 2013-08-05 DIAGNOSIS — F3289 Other specified depressive episodes: Secondary | ICD-10-CM | POA: Diagnosis not present

## 2013-08-05 DIAGNOSIS — M19019 Primary osteoarthritis, unspecified shoulder: Secondary | ICD-10-CM | POA: Diagnosis not present

## 2013-08-05 DIAGNOSIS — Z5189 Encounter for other specified aftercare: Secondary | ICD-10-CM | POA: Diagnosis not present

## 2013-08-05 DIAGNOSIS — I2510 Atherosclerotic heart disease of native coronary artery without angina pectoris: Secondary | ICD-10-CM | POA: Diagnosis not present

## 2013-08-05 DIAGNOSIS — I10 Essential (primary) hypertension: Secondary | ICD-10-CM | POA: Diagnosis not present

## 2013-08-06 ENCOUNTER — Telehealth: Payer: Self-pay | Admitting: *Deleted

## 2013-08-06 DIAGNOSIS — IMO0001 Reserved for inherently not codable concepts without codable children: Secondary | ICD-10-CM | POA: Diagnosis not present

## 2013-08-06 DIAGNOSIS — F3289 Other specified depressive episodes: Secondary | ICD-10-CM | POA: Diagnosis not present

## 2013-08-06 DIAGNOSIS — I10 Essential (primary) hypertension: Secondary | ICD-10-CM | POA: Diagnosis not present

## 2013-08-06 DIAGNOSIS — M19019 Primary osteoarthritis, unspecified shoulder: Secondary | ICD-10-CM | POA: Diagnosis not present

## 2013-08-06 DIAGNOSIS — I2510 Atherosclerotic heart disease of native coronary artery without angina pectoris: Secondary | ICD-10-CM | POA: Diagnosis not present

## 2013-08-06 DIAGNOSIS — F329 Major depressive disorder, single episode, unspecified: Secondary | ICD-10-CM | POA: Diagnosis not present

## 2013-08-06 DIAGNOSIS — Z5189 Encounter for other specified aftercare: Secondary | ICD-10-CM | POA: Diagnosis not present

## 2013-08-06 NOTE — Telephone Encounter (Signed)
High blood pressure readings 191/92 waited 5 minutes and it is 184/84. Morning readings are good. This BP was taken after patient walked and patient stated that she did wake up this morning with a Headache. Caregiver will continue to monitor blood pressure. Please Advise.  Carollee Herter with [employer]

## 2013-08-07 DIAGNOSIS — IMO0001 Reserved for inherently not codable concepts without codable children: Secondary | ICD-10-CM | POA: Diagnosis not present

## 2013-08-07 DIAGNOSIS — Z5189 Encounter for other specified aftercare: Secondary | ICD-10-CM | POA: Diagnosis not present

## 2013-08-07 DIAGNOSIS — I2510 Atherosclerotic heart disease of native coronary artery without angina pectoris: Secondary | ICD-10-CM | POA: Diagnosis not present

## 2013-08-07 DIAGNOSIS — F329 Major depressive disorder, single episode, unspecified: Secondary | ICD-10-CM | POA: Diagnosis not present

## 2013-08-07 DIAGNOSIS — M19019 Primary osteoarthritis, unspecified shoulder: Secondary | ICD-10-CM | POA: Diagnosis not present

## 2013-08-07 DIAGNOSIS — F3289 Other specified depressive episodes: Secondary | ICD-10-CM | POA: Diagnosis not present

## 2013-08-07 DIAGNOSIS — I10 Essential (primary) hypertension: Secondary | ICD-10-CM | POA: Diagnosis not present

## 2013-08-07 NOTE — Telephone Encounter (Signed)
LMOM to return call.

## 2013-08-07 NOTE — Telephone Encounter (Signed)
Was this after his blood pressure medications?  Other than the headache, was she in pain or anxious/agitated about anything?  Have bps since been normal?  Please let me know.

## 2013-08-08 NOTE — Telephone Encounter (Signed)
Caregiver states that BP Readings have been good. She stated that the [place] takes her BP after she walks and does her Physical Therapy. No current headache. Not agitated or anxious and no pain. BP readings are fine now.

## 2013-08-11 ENCOUNTER — Other Ambulatory Visit: Payer: Self-pay

## 2013-08-11 DIAGNOSIS — F329 Major depressive disorder, single episode, unspecified: Secondary | ICD-10-CM | POA: Diagnosis not present

## 2013-08-11 DIAGNOSIS — F3289 Other specified depressive episodes: Secondary | ICD-10-CM | POA: Diagnosis not present

## 2013-08-11 DIAGNOSIS — M19019 Primary osteoarthritis, unspecified shoulder: Secondary | ICD-10-CM | POA: Diagnosis not present

## 2013-08-11 DIAGNOSIS — I2510 Atherosclerotic heart disease of native coronary artery without angina pectoris: Secondary | ICD-10-CM | POA: Diagnosis not present

## 2013-08-11 DIAGNOSIS — Z5189 Encounter for other specified aftercare: Secondary | ICD-10-CM | POA: Diagnosis not present

## 2013-08-11 DIAGNOSIS — IMO0001 Reserved for inherently not codable concepts without codable children: Secondary | ICD-10-CM | POA: Diagnosis not present

## 2013-08-11 DIAGNOSIS — I10 Essential (primary) hypertension: Secondary | ICD-10-CM | POA: Diagnosis not present

## 2013-08-11 MED ORDER — TRAMADOL HCL 50 MG PO TABS: ORAL_TABLET | ORAL | Status: DC

## 2013-08-11 NOTE — Telephone Encounter (Signed)
RX called into [facility]

## 2013-08-12 DIAGNOSIS — M19019 Primary osteoarthritis, unspecified shoulder: Secondary | ICD-10-CM | POA: Diagnosis not present

## 2013-08-12 DIAGNOSIS — F3289 Other specified depressive episodes: Secondary | ICD-10-CM | POA: Diagnosis not present

## 2013-08-12 DIAGNOSIS — Z5189 Encounter for other specified aftercare: Secondary | ICD-10-CM | POA: Diagnosis not present

## 2013-08-12 DIAGNOSIS — I2510 Atherosclerotic heart disease of native coronary artery without angina pectoris: Secondary | ICD-10-CM | POA: Diagnosis not present

## 2013-08-12 DIAGNOSIS — I10 Essential (primary) hypertension: Secondary | ICD-10-CM | POA: Diagnosis not present

## 2013-08-12 DIAGNOSIS — IMO0001 Reserved for inherently not codable concepts without codable children: Secondary | ICD-10-CM | POA: Diagnosis not present

## 2013-08-12 DIAGNOSIS — F329 Major depressive disorder, single episode, unspecified: Secondary | ICD-10-CM | POA: Diagnosis not present

## 2013-08-13 DIAGNOSIS — F329 Major depressive disorder, single episode, unspecified: Secondary | ICD-10-CM | POA: Diagnosis not present

## 2013-08-13 DIAGNOSIS — I10 Essential (primary) hypertension: Secondary | ICD-10-CM | POA: Diagnosis not present

## 2013-08-13 DIAGNOSIS — IMO0001 Reserved for inherently not codable concepts without codable children: Secondary | ICD-10-CM | POA: Diagnosis not present

## 2013-08-13 DIAGNOSIS — I2510 Atherosclerotic heart disease of native coronary artery without angina pectoris: Secondary | ICD-10-CM | POA: Diagnosis not present

## 2013-08-13 DIAGNOSIS — F3289 Other specified depressive episodes: Secondary | ICD-10-CM | POA: Diagnosis not present

## 2013-08-13 DIAGNOSIS — M19019 Primary osteoarthritis, unspecified shoulder: Secondary | ICD-10-CM | POA: Diagnosis not present

## 2013-08-13 DIAGNOSIS — Z5189 Encounter for other specified aftercare: Secondary | ICD-10-CM | POA: Diagnosis not present

## 2013-08-14 DIAGNOSIS — F329 Major depressive disorder, single episode, unspecified: Secondary | ICD-10-CM | POA: Diagnosis not present

## 2013-08-14 DIAGNOSIS — I10 Essential (primary) hypertension: Secondary | ICD-10-CM | POA: Diagnosis not present

## 2013-08-14 DIAGNOSIS — Z5189 Encounter for other specified aftercare: Secondary | ICD-10-CM | POA: Diagnosis not present

## 2013-08-14 DIAGNOSIS — F3289 Other specified depressive episodes: Secondary | ICD-10-CM | POA: Diagnosis not present

## 2013-08-14 DIAGNOSIS — I2510 Atherosclerotic heart disease of native coronary artery without angina pectoris: Secondary | ICD-10-CM | POA: Diagnosis not present

## 2013-08-14 DIAGNOSIS — IMO0001 Reserved for inherently not codable concepts without codable children: Secondary | ICD-10-CM | POA: Diagnosis not present

## 2013-08-14 DIAGNOSIS — M19019 Primary osteoarthritis, unspecified shoulder: Secondary | ICD-10-CM | POA: Diagnosis not present

## 2013-08-15 ENCOUNTER — Telehealth: Payer: Self-pay

## 2013-08-15 ENCOUNTER — Other Ambulatory Visit (INDEPENDENT_AMBULATORY_CARE_PROVIDER_SITE_OTHER): Payer: Medicare Other

## 2013-08-15 DIAGNOSIS — Z87440 Personal history of urinary (tract) infections: Secondary | ICD-10-CM

## 2013-08-15 LAB — POCT URINALYSIS DIPSTICK
Bilirubin, UA: NEGATIVE
Blood, UA: NEGATIVE
Glucose, UA: NEGATIVE
Ketones, UA: NEGATIVE
Leukocytes, UA: NEGATIVE
Nitrite, UA: NEGATIVE
Protein, UA: NEGATIVE
Spec Grav, UA: 1.01
Urobilinogen, UA: 0.2
pH, UA: 7

## 2013-08-15 NOTE — Telephone Encounter (Signed)
I left message on voicemail for [facility] ([place]) informing her of the results of urine dip today. Urinalysis was normal, per Dr.Reed no further recommendations at this time, urine was not sent for culture (per MD order).  Left message on home phone for patient/caregiver informing them as well of urine dip results.

## 2013-08-19 ENCOUNTER — Other Ambulatory Visit: Payer: Self-pay | Admitting: Internal Medicine

## 2013-08-19 DIAGNOSIS — D0512 Intraductal carcinoma in situ of left breast: Secondary | ICD-10-CM

## 2013-08-22 ENCOUNTER — Ambulatory Visit: Payer: Medicare Other | Admitting: Internal Medicine

## 2013-09-09 ENCOUNTER — Other Ambulatory Visit: Payer: Self-pay | Admitting: Internal Medicine

## 2013-09-17 ENCOUNTER — Ambulatory Visit (INDEPENDENT_AMBULATORY_CARE_PROVIDER_SITE_OTHER): Payer: Medicare Other | Admitting: Podiatry

## 2013-09-17 DIAGNOSIS — B351 Tinea unguium: Secondary | ICD-10-CM | POA: Diagnosis not present

## 2013-09-17 DIAGNOSIS — L84 Corns and callosities: Secondary | ICD-10-CM

## 2013-09-17 DIAGNOSIS — M79609 Pain in unspecified limb: Secondary | ICD-10-CM | POA: Diagnosis not present

## 2013-09-17 DIAGNOSIS — M79673 Pain in unspecified foot: Secondary | ICD-10-CM

## 2013-09-18 NOTE — Progress Notes (Signed)
Subjective:     Patient ID: Michelle [name], female   DOB: April 25, 1920, 80 y.o.   MRN: [id number]  HPI patient is found to have nail disease 1-5 of both feet with thick subungual debris that are painful and lesions on the bottom of both feet that she cannot take care of herself   Review of Systems     Objective:   Physical Exam Neurovascular status intact with thick yellow brittle nails 1-5 both feet that her pain full when pressed dorsally and also noted to have keratotic lesions plantar aspect of both the    Assessment:     Mycotic nail infection with pain 1-5 both feet and lesions plantar aspect both feet    Plan:     Debris painful nailbeds 1-5 both feet and debridement lesions on both feet with no iatrogenic bleeding noted

## 2013-09-19 ENCOUNTER — Other Ambulatory Visit: Payer: Self-pay | Admitting: Internal Medicine

## 2013-09-19 ENCOUNTER — Other Ambulatory Visit: Payer: Self-pay

## 2013-09-19 DIAGNOSIS — D0512 Intraductal carcinoma in situ of left breast: Secondary | ICD-10-CM

## 2013-09-22 ENCOUNTER — Other Ambulatory Visit: Payer: Self-pay | Admitting: Internal Medicine

## 2013-09-22 ENCOUNTER — Ambulatory Visit: Payer: Medicare Other | Admitting: Internal Medicine

## 2013-09-29 ENCOUNTER — Ambulatory Visit: Payer: Medicare Other | Admitting: Internal Medicine

## 2013-09-30 ENCOUNTER — Ambulatory Visit
Admission: RE | Admit: 2013-09-30 | Discharge: 2013-09-30 | Disposition: A | Discharge status: Home / Self Care | Payer: Medicare Other | Source: Ambulatory Visit | Attending: Internal Medicine | Admitting: Internal Medicine

## 2013-09-30 DIAGNOSIS — N6489 Other specified disorders of breast: Secondary | ICD-10-CM | POA: Diagnosis not present

## 2013-09-30 DIAGNOSIS — D0512 Intraductal carcinoma in situ of left breast: Secondary | ICD-10-CM

## 2013-09-30 DIAGNOSIS — Z853 Personal history of malignant neoplasm of breast: Secondary | ICD-10-CM | POA: Diagnosis not present

## 2013-10-03 ENCOUNTER — Encounter: Payer: Self-pay | Admitting: Internal Medicine

## 2013-10-03 ENCOUNTER — Telehealth: Payer: Self-pay | Admitting: *Deleted

## 2013-10-03 ENCOUNTER — Ambulatory Visit (INDEPENDENT_AMBULATORY_CARE_PROVIDER_SITE_OTHER): Payer: Medicare Other | Admitting: Internal Medicine

## 2013-10-03 VITALS — BP 132/74 | HR 77 | Temp 97.7°F | Resp 18 | Ht 64.0 in | Wt 195.2 lb

## 2013-10-03 DIAGNOSIS — Z23 Encounter for immunization: Secondary | ICD-10-CM

## 2013-10-03 DIAGNOSIS — H409 Unspecified glaucoma: Secondary | ICD-10-CM

## 2013-10-03 DIAGNOSIS — I10 Essential (primary) hypertension: Secondary | ICD-10-CM | POA: Insufficient documentation

## 2013-10-03 DIAGNOSIS — N3941 Urge incontinence: Secondary | ICD-10-CM | POA: Diagnosis not present

## 2013-10-03 DIAGNOSIS — R0781 Pleurodynia: Secondary | ICD-10-CM

## 2013-10-03 DIAGNOSIS — R079 Chest pain, unspecified: Secondary | ICD-10-CM

## 2013-10-03 DIAGNOSIS — R059 Cough, unspecified: Secondary | ICD-10-CM | POA: Diagnosis not present

## 2013-10-03 DIAGNOSIS — Z5181 Encounter for therapeutic drug level monitoring: Secondary | ICD-10-CM

## 2013-10-03 DIAGNOSIS — R05 Cough: Secondary | ICD-10-CM

## 2013-10-03 DIAGNOSIS — IMO0001 Reserved for inherently not codable concepts without codable children: Secondary | ICD-10-CM | POA: Insufficient documentation

## 2013-10-03 NOTE — Telephone Encounter (Signed)
Message copied by Eilene Ghazi on Fri Oct 03, 2013  3:46 PM ------      Message from: [place], Rexene Edison      Created: Tue Sep 30, 2013  7:51 PM       Mammogram was normal.  F/u in 1 year with bilateral diagnostic mammogram ------

## 2013-10-03 NOTE — Patient Instructions (Signed)
Use lidoderm patches or heat patches on right rib area  Stop statin due to muscle pains and genetic test results  Stop myrbetriq due to interaction with glaucoma drops.

## 2013-10-03 NOTE — Progress Notes (Signed)
Patient ID: Michelle [name], female   DOB: August 01, 1920, 80 y.o.   MRN: [id number]   Location:  [facility]  Code Status: need to review advance directive with her next visit  Allergies  Allergen Reactions  . Codeine     States she blacks out    Chief Complaint  Patient presents with  . Medical Management of Chronic Issues    back pain( right side) cough during night ( clear mucus)    HPI: Patient is a 80 y.o. female seen in the office today for medical mgt of chronic diseases and two acute complaints:   1)  Right sided back pain 2)  Cough during night productive of clear mucus  Review of records indicates she saw Dr. Paulla Dolly with podiatry on 5/20 She went to the ED 07/14/13 with hematuria--had UTI with E coli--was treated with cipro 500mg  po bid for 3 days. BP was high when checked 08/06/13 by Carollee Herter with [facility] and pt had headache.  I sent several questions.  The bps were right after she walked with her therapy and had no other problems. Apparently someone wanted a repeat urine done and urine dip was negative 7/17.   She saw Dr. Paulla Dolly again 8/20 for mycotic nail infection   Having constant pain in her back since fall about 2 wks ago.  Fell on left, but right hurts.  Coughing makes it worse.  No fever, but did have a lot of mucus.  Spent some time in bed, but did not get better.  Went back to regular life going to meals and exercise.  It hurts no matter what she is doing.  Tried hot compress yesterday--did help her.  Used two hot patches at night with help.  Using tramadol and tylenol.  Help her to fall asleep.  Still coughs on and off.  Her caregiver hasn't noticed her coughing too much.  Her nurse has also assessed that this is probably injury to her ribs.  Still has a lot of urgency.  Sometimes can hold urine.  Not making it a lot of the time per her caregiver. Using myrbetriq, but this may interact with her eyedrops.  Discussed the interaction  today.  Continues with muscle pain in shoulders--previously had PMR and remains on lidoderm patches.  Has been on low dose statin with normal values in 2014.    Review of Systems:  Review of Systems  Constitutional: Negative for fever, chills and weight loss.       Weight gain  HENT: Negative for congestion.   Eyes: Negative for blurred vision.  Respiratory: Negative for cough and shortness of breath.   Cardiovascular: Negative for chest pain and leg swelling.  Gastrointestinal: Negative for abdominal pain, constipation, blood in stool and melena.  Genitourinary: Negative for dysuria, urgency and frequency.  Musculoskeletal: Positive for falls and myalgias.       Right posterior rib pain  Skin: Negative for rash.  Neurological: Negative for dizziness and loss of consciousness.  Endo/Heme/Allergies: Does not bruise/bleed easily.  Psychiatric/Behavioral: Positive for memory loss.       Mood is good these days     Past Medical History  Diagnosis Date  . Hypertension   . Hyperlipidemia LDL goal < 100   . Polymyalgia rheumatica   . GERD (gastroesophageal reflux disease)   . Irritable bowel syndrome   . Diverticulitis   . Sciatica   . Vertigo   . Edema of leg   .  Glaucoma   . Appendicitis   . Arthritis   . Acute upper respiratory infections of unspecified site   . Urge incontinence   . Other specified symptom associated with female genital organs   . Regional enteritis of unspecified site   . Sebaceous cyst   . Depression   . Other malaise and fatigue   . Abnormal weight gain   . Leukocytopenia, unspecified   . Dementia in conditions classified elsewhere without behavioral disturbance   . Anemia, unspecified   . Chronic kidney disease, stage III (moderate)   . Unspecified arthropathy, shoulder region   . Other B-complex deficiencies   . Unspecified vitamin D deficiency   . Memory loss   . Abnormality of gait   . Personal history of fall   . Malignant neoplasm of  breast (female), unspecified site   . Unspecified glaucoma   . Diverticulosis of colon (without mention of hemorrhage)   . Irritable bowel syndrome   . Sciatica   . Dizziness and giddiness     Past Surgical History  Procedure Laterality Date  . Nasal sinus surgery  1966  . Cholecystectomy  1972  . Cataract extraction  1990  . Breast surgery      R breast lumpectomy for benign disease  . Breast lumpectomy  10/28/10    left breast     Social History:   reports that she has never smoked. She has never used smokeless tobacco. She reports that she does not drink alcohol or use illicit drugs.  Family History  Problem Relation Age of Onset  . Stroke Father   . Hypertension Mother   . Stroke Mother   . Breast cancer Mother   . Heart attack Brother   . Cancer Brother   . Stroke Brother   . Heart attack Son   . Colon cancer Sister     Medications: Patient's Medications  New Prescriptions   No medications on file  Previous Medications   AMLODIPINE (NORVASC) 5 MG TABLET    TAKE 1 TABLET DAILY.   ASPIRIN 81 MG CHEWABLE TABLET    Chew 81 mg by mouth daily.    BIMATOPROST (LUMIGAN) 0.01 % SOLN    Place 1 drop into both eyes at bedtime.    BRIMONIDINE-TIMOLOL (COMBIGAN) 0.2-0.5 % OPHTHALMIC SOLUTION    Place 1 drop into both eyes 2 (two) times daily.    CALCIUM CARBONATE 200 MG CAPSULE    Take 200 mg by mouth daily.    CITALOPRAM (CELEXA) 10 MG TABLET    TAKE 1 TABLET ONCE DAILY.   DORZOLAMIDE (TRUSOPT) 2 % OPHTHALMIC SOLUTION    Place 1 drop into both eyes 2 (two) times daily.     LIDOCAINE (LIDODERM) 5 %    APPLY 1 PATCH TO EACH SHOULDER , KEEP ON 12 HOURS, THEN KEEP OFF FOR 12 HOURS.   MOMETASONE (NASONEX) 50 MCG/ACT NASAL SPRAY    Place 2 sprays into the nose as needed.   MULTIPLE VITAMIN (MULTIVITAMIN WITH MINERALS) TABS    Take 1 tablet by mouth daily.   MYRBETRIQ 50 MG TB24 TABLET    TAKE 1 TABLET ONCE DAILY.   SIMVASTATIN (ZOCOR) 10 MG TABLET    Take 10 mg by mouth at  bedtime.   TRAMADOL (ULTRAM) 50 MG TABLET    1 by mouth three times daily as needed for pain  Modified Medications   No medications on file  Discontinued Medications   No medications on file  Physical Exam: Filed Vitals:   10/03/13 1104  BP: 132/74  Pulse: 77  Temp: 97.7 F (36.5 C)  TempSrc: Oral  Resp: 18  Height: 5\' 4"  (1.626 m)  Weight: 195 lb 3.2 oz (88.542 kg)  SpO2: 95%  Physical Exam  Constitutional: She appears well-developed and well-nourished. No distress.  HENT:  Head: Normocephalic and atraumatic.  Eyes:  glasses  Cardiovascular: Normal rate, regular rhythm, normal heart sounds and intact distal pulses.   Pulmonary/Chest: Effort normal and breath sounds normal. No respiratory distress.  Abdominal: Soft. Bowel sounds are normal. She exhibits no distension and no mass. There is no tenderness.  Musculoskeletal: Normal range of motion. She exhibits tenderness.  Of right posterior lower ribs  Neurological: She is alert. No cranial nerve deficit.  Skin: Skin is warm and dry.  Psychiatric: She has a normal mood and affect.     Labs reviewed: Basic Metabolic Panel:  Recent Labs  02/17/13 1215 07/14/13 1150  NA 142 141  K 4.4 4.5  CL 106 104  CO2 24 26  GLUCOSE 96 94  BUN 19 22  CREATININE 1.33* 1.21*  CALCIUM 9.3 9.4   Liver Function Tests:  Recent Labs  10/28/12 1602 07/14/13 1150  AST 20 18  ALT 12 11  ALKPHOS 82 93  BILITOT 0.6 0.7  PROT 6.6 7.1  ALBUMIN  --  3.3*    Recent Labs  10/28/12 1602  LIPASE 13  AMYLASE 33  CBC:  Recent Labs  10/28/12 1602 07/14/13 1150  WBC 3.4 3.6*  NEUTROABS 1.7 2.3  HGB 11.2 10.8*  HCT 34.7 34.7*  MCV 94 94.6  PLT  --  172   Diagnostic MM 09/30/13:  Fibroglandular density, but no malignancy, f/u 1 year  Assessment/Plan 1. Rib pain on right side -suspect due to her recent cold and coughing, but lungs now clear and cough improved -may use warm compresses or lidoderm patch over the  area -discussed that rib xrays will not really help b/c no procedure would be done if broken anyway  2. Cough -resolved  3. Urge incontinence -myrbetriq interacting with her glaucoma drops so will stop it b/c it is not helping much with her incontinence per her caregiver anyway  4. Glaucoma -cont current eyedrops  5. Essential hypertension, benign -at goal with current mgt  6. Myalgia and myositis -has h/o PMR--off steroids (side effects) and uses lidoderm patches on shoulders -will d/c simvastatin due to age and goals of care at this point which are more quality than quantity  7.  Need for influenza Vaccine given today  Labs/tests ordered:  Will f/u labs at next appt Next appt:  3 mos

## 2013-10-07 ENCOUNTER — Other Ambulatory Visit: Payer: Self-pay | Admitting: *Deleted

## 2013-10-07 MED ORDER — TRAMADOL HCL 50 MG PO TABS: ORAL_TABLET | ORAL | Status: DC

## 2013-10-07 NOTE — Telephone Encounter (Signed)
[facility]  

## 2013-10-17 ENCOUNTER — Other Ambulatory Visit: Payer: Self-pay | Admitting: Internal Medicine

## 2013-10-30 ENCOUNTER — Ambulatory Visit: Payer: Medicare Other | Admitting: Internal Medicine

## 2013-11-10 ENCOUNTER — Ambulatory Visit (HOSPITAL_BASED_OUTPATIENT_CLINIC_OR_DEPARTMENT_OTHER): Payer: Medicare Other | Admitting: Nurse Practitioner

## 2013-11-10 ENCOUNTER — Other Ambulatory Visit: Payer: Self-pay | Admitting: *Deleted

## 2013-11-10 ENCOUNTER — Encounter: Payer: Self-pay | Admitting: Nurse Practitioner

## 2013-11-10 ENCOUNTER — Telehealth: Payer: Self-pay | Admitting: Nurse Practitioner

## 2013-11-10 ENCOUNTER — Other Ambulatory Visit (HOSPITAL_BASED_OUTPATIENT_CLINIC_OR_DEPARTMENT_OTHER): Payer: Medicare Other

## 2013-11-10 VITALS — BP 140/77 | HR 56 | Temp 98.2°F | Resp 18 | Ht 64.0 in | Wt 197.5 lb

## 2013-11-10 DIAGNOSIS — Z853 Personal history of malignant neoplasm of breast: Secondary | ICD-10-CM

## 2013-11-10 DIAGNOSIS — D0512 Intraductal carcinoma in situ of left breast: Secondary | ICD-10-CM

## 2013-11-10 LAB — CBC WITH DIFFERENTIAL/PLATELET
BASO%: 1 % (ref 0.0–2.0)
BASOS ABS: 0 10*3/uL (ref 0.0–0.1)
EOS ABS: 0.1 10*3/uL (ref 0.0–0.5)
EOS%: 2.1 % (ref 0.0–7.0)
HEMATOCRIT: 34.5 % — AB (ref 34.8–46.6)
HEMOGLOBIN: 10.9 g/dL — AB (ref 11.6–15.9)
LYMPH%: 31.3 % (ref 14.0–49.7)
MCH: 29.6 pg (ref 25.1–34.0)
MCHC: 31.7 g/dL (ref 31.5–36.0)
MCV: 93.6 fL (ref 79.5–101.0)
MONO#: 0.4 10*3/uL (ref 0.1–0.9)
MONO%: 9.7 % (ref 0.0–14.0)
NEUT%: 55.9 % (ref 38.4–76.8)
NEUTROS ABS: 2 10*3/uL (ref 1.5–6.5)
Platelets: 178 10*3/uL (ref 145–400)
RBC: 3.69 10*6/uL — ABNORMAL LOW (ref 3.70–5.45)
RDW: 14.1 % (ref 11.2–14.5)
WBC: 3.6 10*3/uL — ABNORMAL LOW (ref 3.9–10.3)
lymph#: 1.1 10*3/uL (ref 0.9–3.3)

## 2013-11-10 LAB — COMPREHENSIVE METABOLIC PANEL (CC13)
ALBUMIN: 3.3 g/dL — AB (ref 3.5–5.0)
ALK PHOS: 66 U/L (ref 40–150)
ALT: 13 U/L (ref 0–55)
AST: 17 U/L (ref 5–34)
Anion Gap: 7 mEq/L (ref 3–11)
BUN: 24.1 mg/dL (ref 7.0–26.0)
CO2: 26 mEq/L (ref 22–29)
Calcium: 9.6 mg/dL (ref 8.4–10.4)
Chloride: 110 mEq/L — ABNORMAL HIGH (ref 98–109)
Creatinine: 1.2 mg/dL — ABNORMAL HIGH (ref 0.6–1.1)
GLUCOSE: 94 mg/dL (ref 70–140)
Potassium: 4.7 mEq/L (ref 3.5–5.1)
Sodium: 142 mEq/L (ref 136–145)
Total Bilirubin: 0.63 mg/dL (ref 0.20–1.20)
Total Protein: 6.9 g/dL (ref 6.4–8.3)

## 2013-11-10 NOTE — Progress Notes (Signed)
ID: Ellender Hose   DOB: [birth year]  MR#: [id number]  TIW#:[id number]  PCP: Hollace Kinnier, DO GYN:  SU: Stark Klein OTHER MD: Kyung Rudd   HISTORY OF PRESENT ILLNESS: Ms. Parchment had routine screening mammography September 16, 2010.  There were new microcalcifications noted in the left breast and she was brought back for additional views on September 27, 2010.  Bilateral diagnostic mammography on that day showed a cluster of calcifications in the lower inner quadrant of the left breast measuring up to 2.9 cm.  There was a faint parenchymal density associated with these calcifications.  Accordingly, the patient proceeded to stereotactically guided vacuum-assisted biopsy of the left breast October 04, 2010.  The pathology from that procedure ([id number]) showed a ductal carcinoma in situ.  This appeared to be intermediate to high-grade and it was strongly estrogen receptor positive at 100%, weakly progesterone receptor positive at 6%. Her subsequent history is as detailed below  INTERVAL HISTORY: Mrs. Mealy returns today for follow up of her breast cancer, accompanied by a hired caregiver. The interval history is unremarkable. No new medical or surgical history to add.   REVIEW OF SYSTEMS: Mrs. Pearlman denies fevers, chills, nausea or vomiting. She has some constipation, but this is managed well with prune juice PRN. She has a bowel movement at least every other day this way. She has no shortness of breath, chest pain, cough, or palpitations. Her right shoulder, hips, and lower back continue to ache. This pain is managed well with lidocaine patches and tramadol. She is currently seated in a wheelchair, but she can walk short distances. Her appetite is good. She has no unexplained weight loss or night sweats. A detailed review of systems is otherwise noncontributory.   PAST MEDICAL HISTORY: Past Medical History  Diagnosis Date  . Hypertension   . Hyperlipidemia LDL goal < 100   . Polymyalgia  rheumatica   . GERD (gastroesophageal reflux disease)   . Irritable bowel syndrome   . Diverticulitis   . Sciatica   . Vertigo   . Edema of leg   . Glaucoma   . Appendicitis   . Arthritis   . Acute upper respiratory infections of unspecified site   . Urge incontinence   . Other specified symptom associated with female genital organs   . Regional enteritis of unspecified site   . Sebaceous cyst   . Depression   . Other malaise and fatigue   . Abnormal weight gain   . Leukocytopenia, unspecified   . Dementia in conditions classified elsewhere without behavioral disturbance   . Anemia, unspecified   . Chronic kidney disease, stage III (moderate)   . Unspecified arthropathy, shoulder region   . Other B-complex deficiencies   . Unspecified vitamin D deficiency   . Memory loss   . Abnormality of gait   . Personal history of fall   . Malignant neoplasm of breast (female), unspecified site   . Unspecified glaucoma   . Diverticulosis of colon (without mention of hemorrhage)   . Irritable bowel syndrome   . Sciatica   . Dizziness and giddiness   The patient has an extensive medical history including hypertension, hyperlipidemia, polymyalgia rheumatica, GERD, irritable bowel syndrome, diverticulosis, sciatica, hyperactive bladder, vertigo, phlebitis, glaucoma and remote appendicitis.  She has undergone sinus surgery, cataract surgery and is status post cholecystectomy.  PAST SURGICAL HISTORY: Past Surgical History  Procedure Laterality Date  . Nasal sinus surgery  1966  . Cholecystectomy  1972  . Cataract extraction  1990  . Breast surgery      R breast lumpectomy for benign disease  . Breast lumpectomy  10/28/10    left breast     FAMILY HISTORY Family History  Problem Relation Age of Onset  . Stroke Father   . Hypertension Mother   . Stroke Mother   . Breast cancer Mother   . Heart attack Brother   . Cancer Brother   . Stroke Brother   . Heart attack Son   . Colon  cancer Sister   The patient's father died from a stroke at the age of 59.  The patient's mother died at the age of 93 from breast cancer.  The patient had a brother who died at age 49 from a stroke and a sister who died at age 31 from colon cancer.  Another brother had Hodgkin's disease.  There were altogether 5 brothers and 5 sisters.  GYNECOLOGIC HISTORY: She had menarche age 52.  She is GX, P1, age at first live birth of 68.  She never took hormone replacement.  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   Her daughter, Eulis Canner, present today, is Customer service manager at [employer] for [employer].  The patient's husband, Geoffery Spruce, died many years ago.  [name] lives in a retirement home and, aside from breakfast, she does not cook any meals, no longer drives and mostly stays around the retirement home.  She does do some rehab exercises there. She at tends [place] the [place] DIRECTIVES:  HEALTH MAINTENANCE: History  Substance Use Topics  . Smoking status: Never Smoker   . Smokeless tobacco: Never Used  . Alcohol Use: No     Colonoscopy:  PAP:  Bone density:  Lipid panel:  Allergies  Allergen Reactions  . Codeine     States she blacks out    Current Outpatient Prescriptions  Medication Sig Dispense Refill  . amLODipine (NORVASC) 5 MG tablet TAKE 1 TABLET DAILY.  30 tablet  5  . aspirin 81 MG chewable tablet Chew 81 mg by mouth daily.       . bimatoprost (LUMIGAN) 0.01 % SOLN Place 1 drop into both eyes at bedtime.       . brimonidine-timolol (COMBIGAN) 0.2-0.5 % ophthalmic solution Place 1 drop into both eyes 2 (two) times daily.       . calcium carbonate 200 MG capsule Take 200 mg by mouth daily.       . citalopram (CELEXA) 10 MG tablet TAKE 1 TABLET ONCE DAILY.  30 tablet  5  . dorzolamide (TRUSOPT) 2 % ophthalmic solution Place 1 drop into both eyes 2 (two) times daily.         Marland Kitchen lidocaine (LIDODERM) 5 % APPLY 1 PATCH TO EACH SHOULDER , KEEP ON 12 HOURS, THEN KEEP OFF FOR 12 HOURS.  60 patch  1  . Multiple Vitamin (MULTIVITAMIN WITH MINERALS) TABS Take 1 tablet by mouth daily.      . traMADol (ULTRAM) 50 MG tablet Take one tablet by mouth three times daily as needed for pain  90 tablet  0  . mometasone (NASONEX) 50 MCG/ACT nasal spray Place 2 sprays into the nose as needed.       No current facility-administered medications for this visit.    OBJECTIVE: Elderly African American woman examined in a wheelchair Filed Vitals:   11/10/13 1207  BP: 140/77  Pulse: 56  Temp: 98.2 F (36.8 C)  Resp: 18     Body mass index is 33.88 kg/(m^2).    ECOG FS: 2  Skin: warm, dry  HEENT: sclerae anicteric, conjunctivae pink, oropharynx clear. No thrush or mucositis.  Lymph Nodes: No cervical or supraclavicular lymphadenopathy  Lungs: clear to auscultation bilaterally, no rales, wheezes, or rhonci  Heart: regular rate and rhythm  Abdomen: round, soft, non tender, positive bowel sounds  Musculoskeletal: No focal spinal tenderness, bilateral ankles with 1+ non-pitting edema  Neuro: non focal, well oriented, positive affect  Breasts: left breast status post lumpectomy. No evidence of recurrent disease. Left axilla benign. Right  Breast unremarkable.   LAB RESULTS: Lab Results  Component Value Date   WBC 3.6* 11/10/2013   NEUTROABS 2.0 11/10/2013   HGB 10.9* 11/10/2013   HCT 34.5* 11/10/2013   MCV 93.6 11/10/2013   PLT 178 11/10/2013      Chemistry      Component Value Date/Time   NA 142 11/10/2013 1153   NA 141 07/14/2013 1150   NA 142 02/17/2013 1215   K 4.7 11/10/2013 1153   K 4.5 07/14/2013 1150   CL 104 07/14/2013 1150   CL 109* 01/04/2012 1144   CO2 26 11/10/2013 1153   CO2 26 07/14/2013 1150   BUN 24.1 11/10/2013 1153   BUN 22 07/14/2013 1150   BUN 19 02/17/2013 1215   CREATININE 1.2* 11/10/2013 1153   CREATININE 1.21* 07/14/2013 1150      Component Value  Date/Time   CALCIUM 9.6 11/10/2013 1153   CALCIUM 9.4 07/14/2013 1150   ALKPHOS 66 11/10/2013 1153   ALKPHOS 93 07/14/2013 1150   AST 17 11/10/2013 1153   AST 18 07/14/2013 1150   ALT 13 11/10/2013 1153   ALT 11 07/14/2013 1150   BILITOT 0.63 11/10/2013 1153   BILITOT 0.7 07/14/2013 1150       No results found for this basename: LABCA2    No components found with this basename: LABCA125    No results found for this basename: INR,  in the last 168 hours  Urinalysis    Component Value Date/Time   COLORURINE YELLOW 07/14/2013 1255   APPEARANCEUR CLOUDY* 07/14/2013 1255   LABSPEC 1.007 07/14/2013 1255   PHURINE 7.0 07/14/2013 1255   GLUCOSEU NEGATIVE 07/14/2013 1255   HGBUR TRACE* 07/14/2013 1255   BILIRUBINUR Neg 08/15/2013 [place] 07/14/2013 [place] 07/14/2013 1255   PROTEINUR Neg 08/15/2013 1044   PROTEINUR NEGATIVE 07/14/2013 1255   UROBILINOGEN 0.2 08/15/2013 1044   UROBILINOGEN 0.2 07/14/2013 1255   NITRITE Neg 08/15/2013 1044   NITRITE NEGATIVE 07/14/2013 1255   LEUKOCYTESUR Negative 08/15/2013 1044    STUDIES: Most recent mammogram on 09/30/13 was unremarkable.   ASSESSMENT:  80 y.o. old [place] woman status post left lumpectomy October 28, 2010 for a 1.8 cm ductal carcinoma in situ, high-grade, estrogen and progesterone receptor positive at 100% and 6%, with negative margins. She did not receive radiation, but took letrozole for one year, stopping October 2013  PLAN:   Mrs. Nicholson continues to do well. She is now 3 years out from her definitive surgery with no evidence of recurrent disease. The labs were reviewed in detail and were stable.   Though happy with her care, Mrs. Stoneberg was disappointed that her appointment today was not with Dr. Jana Hakim. She would prefer to remain on his service. She has requested to see him before her visit  in October of next year. An appointment was made to see in him 3 months at the end of January 2016.    Mrs. Riedl understands and agrees with this plan. She has been encouraged to call with any issues that might arise before her next visit here.   Marcelino Duster    11/10/2013

## 2013-11-18 ENCOUNTER — Other Ambulatory Visit: Payer: Self-pay | Admitting: *Deleted

## 2013-11-18 MED ORDER — TRAMADOL HCL 50 MG PO TABS: ORAL_TABLET | ORAL | Status: DC

## 2013-11-18 NOTE — Telephone Encounter (Signed)
[facility]  

## 2013-12-03 ENCOUNTER — Other Ambulatory Visit: Payer: Self-pay | Admitting: *Deleted

## 2013-12-03 MED ORDER — LIDOCAINE 5 % EX PTCH: MEDICATED_PATCH | CUTANEOUS | Status: DC

## 2013-12-03 NOTE — Telephone Encounter (Signed)
[facility]  

## 2013-12-15 DIAGNOSIS — H52203 Unspecified astigmatism, bilateral: Secondary | ICD-10-CM | POA: Diagnosis not present

## 2013-12-15 DIAGNOSIS — H532 Diplopia: Secondary | ICD-10-CM | POA: Diagnosis not present

## 2013-12-15 DIAGNOSIS — H4011X3 Primary open-angle glaucoma, severe stage: Secondary | ICD-10-CM | POA: Diagnosis not present

## 2013-12-15 IMAGING — CR DG CHEST 2V
2 series · 2 of 2 positions shown · non-contrast
Comparison: Chest x-ray 02/12/2011.

CLINICAL DATA: History of fall with lower back pain.  Syncope.

CHEST - 2 VIEW

[w chest lat]
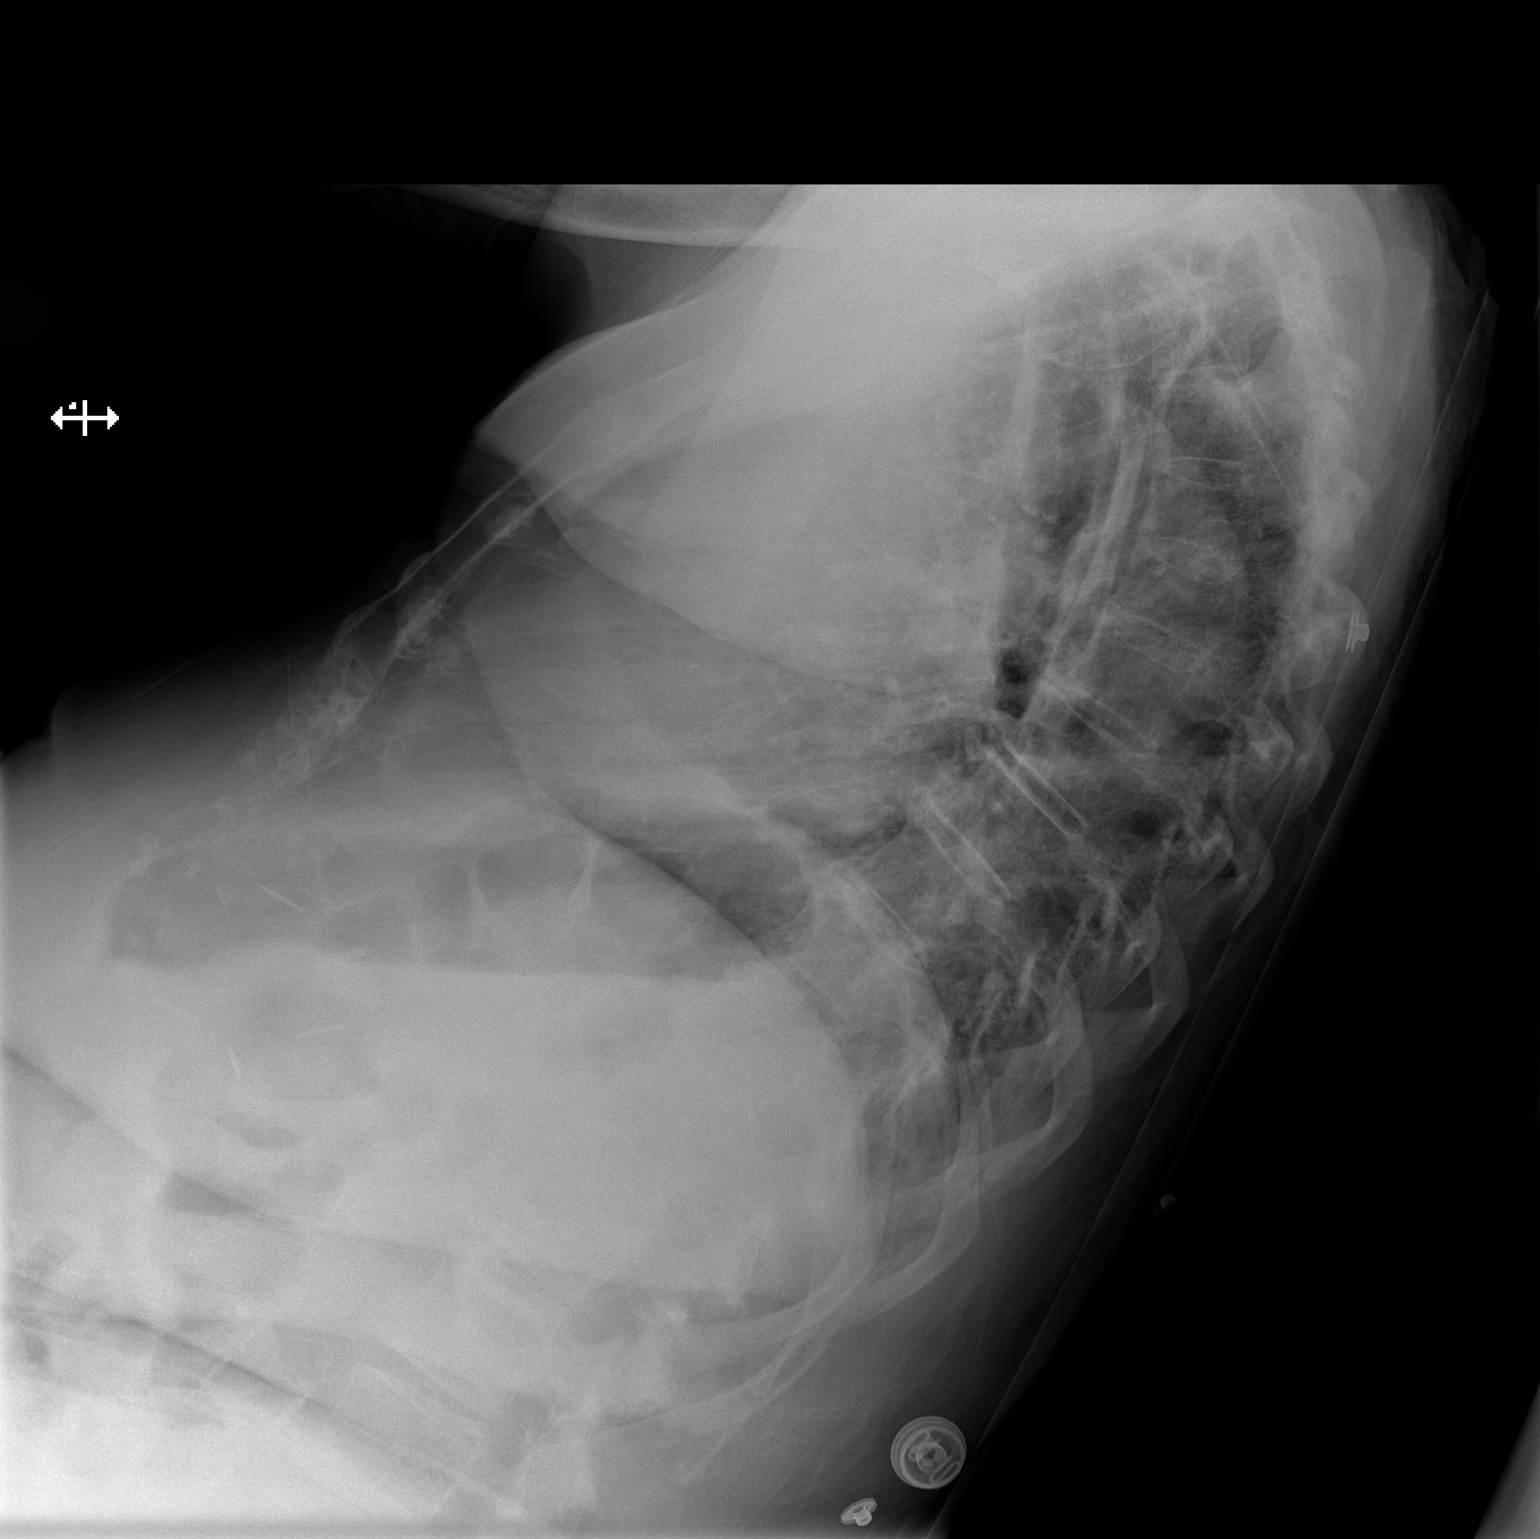

[x chest ap]
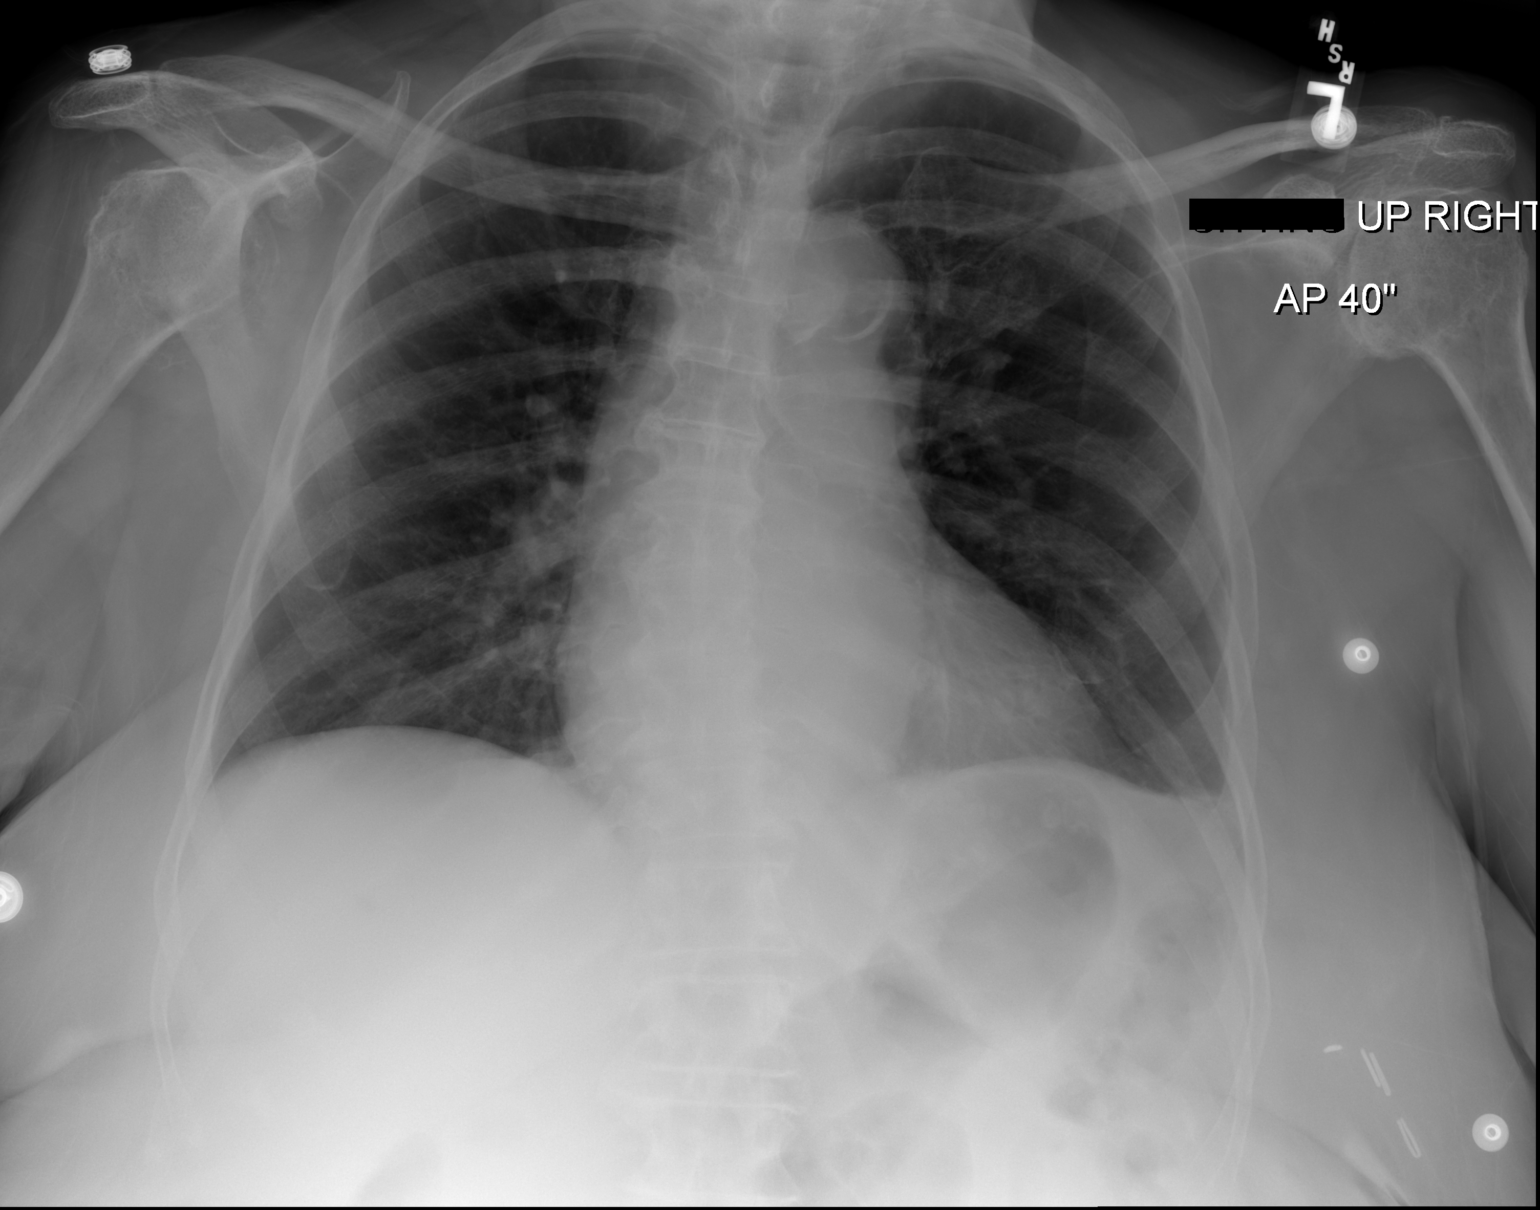

[2 of 2 positions shown; findings below may reference images not displayed]

FINDINGS: Lung volumes are low.  Small left pleural effusion.  No
acute consolidative airspace disease.  Pulmonary vasculature is
normal.  Heart size is within normal limits. No pneumothorax.  The
patient is rotated to the left on today's exam, resulting in
distortion of the mediastinal contours and reduced diagnostic
sensitivity and specificity for mediastinal pathology.
Atherosclerosis in the thoracic aorta. No definite acute displaced
rib fractures are noted.  Surgical clips in the left breast.
IMPRESSION: 1.  Low lung volumes with small left-sided pleural effusion.
2.  Atherosclerosis.

## 2013-12-16 ENCOUNTER — Ambulatory Visit (INDEPENDENT_AMBULATORY_CARE_PROVIDER_SITE_OTHER): Payer: Medicare Other | Admitting: Internal Medicine

## 2013-12-16 ENCOUNTER — Encounter: Payer: Self-pay | Admitting: Internal Medicine

## 2013-12-16 VITALS — BP 122/78 | Temp 97.8°F | Resp 10 | Wt 189.0 lb

## 2013-12-16 DIAGNOSIS — R309 Painful micturition, unspecified: Secondary | ICD-10-CM

## 2013-12-16 DIAGNOSIS — F411 Generalized anxiety disorder: Secondary | ICD-10-CM

## 2013-12-16 DIAGNOSIS — R29898 Other symptoms and signs involving the musculoskeletal system: Secondary | ICD-10-CM | POA: Diagnosis not present

## 2013-12-16 DIAGNOSIS — R300 Dysuria: Secondary | ICD-10-CM | POA: Diagnosis not present

## 2013-12-16 LAB — POCT URINALYSIS DIPSTICK
Bilirubin, UA: NEGATIVE
Blood, UA: NEGATIVE
Glucose, UA: NEGATIVE
Ketones, UA: NEGATIVE
NITRITE UA: NEGATIVE
Protein, UA: NEGATIVE
Spec Grav, UA: 1.015
Urobilinogen, UA: 0.2
pH, UA: 6

## 2013-12-16 MED ORDER — PHENAZOPYRIDINE HCL 97.2 MG PO TABS: 97.0000 mg | ORAL_TABLET | Freq: Three times a day (TID) | ORAL | Status: DC | PRN

## 2013-12-16 MED ORDER — SULFAMETHOXAZOLE-TRIMETHOPRIM 800-160 MG PO TABS: 1.0000 | ORAL_TABLET | Freq: Two times a day (BID) | ORAL | Status: DC

## 2013-12-16 NOTE — Progress Notes (Signed)
Patient ID: Michelle [name], female   DOB: 03-Oct-1920, 80 y.o.   MRN: [id number]    Chief Complaint  Patient presents with  . Acute Visit    UTI- painful when urinating x 1 week   . Extremity Weakness    Ongoing leg weakness   . Anxiety    Patient's sister deceased x 2 weeks ago   Allergies  Allergen Reactions  . Codeine     States she blacks out   HPI 80 y/o female patient here with her caregiver/nurse for acute concerns. She has burning and pain with urination for a week. She also complaints of lower abdomen discomfort. Has chronic low back pain. Denies flank pain or radiation of pain to groin area. Denies nausea or vomiting No fever or chills No acute behavioral changes noted by family/ caregiver Lost her sister 2 weeks back and family feels this has made her more anxious and week. Pt mentions this has disturbed her but she is coping well with current situation. She denies any suicidal ideation.  She also mentions that her bilateral lower leg weakness has worsened over past month. She has had this weakness for several months and therapy in past has helped and would like to see if she could undergo therapy again. Denies numbness or tingling in her legs On wheelchair at present Denies bowel or bladder incontinence Denies UE weakness  ROS See hpi  Past Medical History  Diagnosis Date  . Hypertension   . Hyperlipidemia LDL goal < 100   . Polymyalgia rheumatica   . GERD (gastroesophageal reflux disease)   . Irritable bowel syndrome   . Diverticulitis   . Sciatica   . Vertigo   . Edema of leg   . Glaucoma   . Appendicitis   . Arthritis   . Acute upper respiratory infections of unspecified site   . Urge incontinence   . Other specified symptom associated with female genital organs   . Regional enteritis of unspecified site   . Sebaceous cyst   . Depression   . Other malaise and fatigue   . Abnormal weight gain   . Leukocytopenia, unspecified   . Dementia in  conditions classified elsewhere without behavioral disturbance   . Anemia, unspecified   . Chronic kidney disease, stage III (moderate)   . Unspecified arthropathy, shoulder region   . Other B-complex deficiencies   . Unspecified vitamin D deficiency   . Memory loss   . Abnormality of gait   . Personal history of fall   . Malignant neoplasm of breast (female), unspecified site   . Unspecified glaucoma   . Diverticulosis of colon (without mention of hemorrhage)   . Irritable bowel syndrome   . Sciatica   . Dizziness and giddiness    Medication reviewed. See [facility]  Physical exam BP 122/78 mmHg  Temp(Src) 97.8 F (36.6 C) (Oral)  Resp 10  Wt 189 lb (85.73 kg)  General- elderly female in no acute distress Head- atraumatic, normocephalic Neck- no lymphadenopathy Cardiovascular- normal s1,s2, no murmurs Respiratory- bilateral clear to auscultation, no wheeze, no rhonchi, no crackles Abdomen- bowel sounds present, soft, non tender, no CVA tenderness, no suprapubic tenderness Musculoskeletal- able to move all 4 extremities, LE strength 4/5, sensation to touch intact, no spinal and paraspinal tenderness, on a wheelchair  Neurological- no focal deficit Skin- warm and dry Psychiatry- normal mood and affect  Assessment/plan  1. Painful urination Urine dipstick shows negative for all components, positive for leukocytes. Given her symptoms  with treat her empirically with bactrim ds 1 tab bid for a week and pyridium tid prn for pain. F/u on culture report for antibiotic sensitivity. Encouraged hydration - POCT urinalysis dipstick - Urine culture  2. Weakness of both lower extremities Ongoing with progression. No neurological deficit. Home health [facility] for strengthening exercises - Ambulatory referral to [facility]  3. Anxiety In grief period. Calm this visit. Pt refuses anxiolytics at present. Monitor clinically. Continue her celexa

## 2013-12-17 ENCOUNTER — Ambulatory Visit: Payer: Medicare Other | Admitting: Podiatry

## 2013-12-18 DIAGNOSIS — I129 Hypertensive chronic kidney disease with stage 1 through stage 4 chronic kidney disease, or unspecified chronic kidney disease: Secondary | ICD-10-CM

## 2013-12-18 DIAGNOSIS — F419 Anxiety disorder, unspecified: Secondary | ICD-10-CM | POA: Diagnosis not present

## 2013-12-18 DIAGNOSIS — N189 Chronic kidney disease, unspecified: Secondary | ICD-10-CM | POA: Diagnosis not present

## 2013-12-18 DIAGNOSIS — F329 Major depressive disorder, single episode, unspecified: Secondary | ICD-10-CM | POA: Diagnosis not present

## 2013-12-18 DIAGNOSIS — G309 Alzheimer's disease, unspecified: Secondary | ICD-10-CM

## 2013-12-18 DIAGNOSIS — F0280 Dementia in other diseases classified elsewhere without behavioral disturbance: Secondary | ICD-10-CM | POA: Diagnosis not present

## 2013-12-18 DIAGNOSIS — M6281 Muscle weakness (generalized): Secondary | ICD-10-CM | POA: Diagnosis not present

## 2013-12-18 LAB — URINE CULTURE

## 2013-12-23 DIAGNOSIS — G309 Alzheimer's disease, unspecified: Secondary | ICD-10-CM | POA: Diagnosis not present

## 2013-12-23 DIAGNOSIS — I129 Hypertensive chronic kidney disease with stage 1 through stage 4 chronic kidney disease, or unspecified chronic kidney disease: Secondary | ICD-10-CM | POA: Diagnosis not present

## 2013-12-23 DIAGNOSIS — M6281 Muscle weakness (generalized): Secondary | ICD-10-CM | POA: Diagnosis not present

## 2013-12-23 DIAGNOSIS — F329 Major depressive disorder, single episode, unspecified: Secondary | ICD-10-CM | POA: Diagnosis not present

## 2013-12-23 DIAGNOSIS — F0280 Dementia in other diseases classified elsewhere without behavioral disturbance: Secondary | ICD-10-CM | POA: Diagnosis not present

## 2013-12-23 DIAGNOSIS — N189 Chronic kidney disease, unspecified: Secondary | ICD-10-CM | POA: Diagnosis not present

## 2013-12-24 DIAGNOSIS — N189 Chronic kidney disease, unspecified: Secondary | ICD-10-CM | POA: Diagnosis not present

## 2013-12-24 DIAGNOSIS — M6281 Muscle weakness (generalized): Secondary | ICD-10-CM | POA: Diagnosis not present

## 2013-12-24 DIAGNOSIS — F329 Major depressive disorder, single episode, unspecified: Secondary | ICD-10-CM | POA: Diagnosis not present

## 2013-12-24 DIAGNOSIS — F0280 Dementia in other diseases classified elsewhere without behavioral disturbance: Secondary | ICD-10-CM | POA: Diagnosis not present

## 2013-12-24 DIAGNOSIS — I129 Hypertensive chronic kidney disease with stage 1 through stage 4 chronic kidney disease, or unspecified chronic kidney disease: Secondary | ICD-10-CM | POA: Diagnosis not present

## 2013-12-24 DIAGNOSIS — G309 Alzheimer's disease, unspecified: Secondary | ICD-10-CM | POA: Diagnosis not present

## 2013-12-26 DIAGNOSIS — N189 Chronic kidney disease, unspecified: Secondary | ICD-10-CM | POA: Diagnosis not present

## 2013-12-26 DIAGNOSIS — M6281 Muscle weakness (generalized): Secondary | ICD-10-CM | POA: Diagnosis not present

## 2013-12-26 DIAGNOSIS — F329 Major depressive disorder, single episode, unspecified: Secondary | ICD-10-CM | POA: Diagnosis not present

## 2013-12-26 DIAGNOSIS — G309 Alzheimer's disease, unspecified: Secondary | ICD-10-CM | POA: Diagnosis not present

## 2013-12-26 DIAGNOSIS — I129 Hypertensive chronic kidney disease with stage 1 through stage 4 chronic kidney disease, or unspecified chronic kidney disease: Secondary | ICD-10-CM | POA: Diagnosis not present

## 2013-12-26 DIAGNOSIS — F0280 Dementia in other diseases classified elsewhere without behavioral disturbance: Secondary | ICD-10-CM | POA: Diagnosis not present

## 2013-12-29 ENCOUNTER — Ambulatory Visit (INDEPENDENT_AMBULATORY_CARE_PROVIDER_SITE_OTHER): Payer: Medicare Other | Admitting: Podiatry

## 2013-12-29 ENCOUNTER — Encounter: Payer: Self-pay | Admitting: Podiatry

## 2013-12-29 DIAGNOSIS — B351 Tinea unguium: Secondary | ICD-10-CM | POA: Diagnosis not present

## 2013-12-29 DIAGNOSIS — L84 Corns and callosities: Secondary | ICD-10-CM

## 2013-12-29 DIAGNOSIS — M79673 Pain in unspecified foot: Secondary | ICD-10-CM

## 2013-12-29 NOTE — Progress Notes (Signed)
Subjective:     Patient ID: Michelle [name], female   DOB: 10-29-1920, 80 y.o.   MRN: [id number]  HPI patient is found to have nail disease 1-5 of both feet with thick subungual debris that are painful and lesions on the bottom of both feet that she cannot take care of herself   Review of Systems     Objective:   Physical Exam Neurovascular status intact with thick yellow brittle nails 1-5 both feet that her pain full when pressed dorsally and also noted to have keratotic lesions plantar aspect of both the    Assessment:     Mycotic nail infection with pain 1-5 both feet and lesions plantar aspect both feet    Plan:     Debris painful nailbeds 1-5 both feet and debridement lesions on both feet with no iatrogenic bleeding noted

## 2013-12-30 DIAGNOSIS — F329 Major depressive disorder, single episode, unspecified: Secondary | ICD-10-CM | POA: Diagnosis not present

## 2013-12-30 DIAGNOSIS — F0280 Dementia in other diseases classified elsewhere without behavioral disturbance: Secondary | ICD-10-CM | POA: Diagnosis not present

## 2013-12-30 DIAGNOSIS — I129 Hypertensive chronic kidney disease with stage 1 through stage 4 chronic kidney disease, or unspecified chronic kidney disease: Secondary | ICD-10-CM | POA: Diagnosis not present

## 2013-12-30 DIAGNOSIS — N189 Chronic kidney disease, unspecified: Secondary | ICD-10-CM | POA: Diagnosis not present

## 2013-12-30 DIAGNOSIS — G309 Alzheimer's disease, unspecified: Secondary | ICD-10-CM | POA: Diagnosis not present

## 2013-12-30 DIAGNOSIS — M6281 Muscle weakness (generalized): Secondary | ICD-10-CM | POA: Diagnosis not present

## 2013-12-31 DIAGNOSIS — I129 Hypertensive chronic kidney disease with stage 1 through stage 4 chronic kidney disease, or unspecified chronic kidney disease: Secondary | ICD-10-CM | POA: Diagnosis not present

## 2013-12-31 DIAGNOSIS — N189 Chronic kidney disease, unspecified: Secondary | ICD-10-CM | POA: Diagnosis not present

## 2013-12-31 DIAGNOSIS — F0280 Dementia in other diseases classified elsewhere without behavioral disturbance: Secondary | ICD-10-CM | POA: Diagnosis not present

## 2013-12-31 DIAGNOSIS — G309 Alzheimer's disease, unspecified: Secondary | ICD-10-CM | POA: Diagnosis not present

## 2013-12-31 DIAGNOSIS — M6281 Muscle weakness (generalized): Secondary | ICD-10-CM | POA: Diagnosis not present

## 2013-12-31 DIAGNOSIS — F329 Major depressive disorder, single episode, unspecified: Secondary | ICD-10-CM | POA: Diagnosis not present

## 2014-01-01 ENCOUNTER — Ambulatory Visit: Payer: Medicare Other | Admitting: Internal Medicine

## 2014-01-01 ENCOUNTER — Encounter: Payer: Self-pay | Admitting: Internal Medicine

## 2014-01-01 DIAGNOSIS — Z0289 Encounter for other administrative examinations: Secondary | ICD-10-CM

## 2014-01-02 DIAGNOSIS — I129 Hypertensive chronic kidney disease with stage 1 through stage 4 chronic kidney disease, or unspecified chronic kidney disease: Secondary | ICD-10-CM | POA: Diagnosis not present

## 2014-01-02 DIAGNOSIS — F329 Major depressive disorder, single episode, unspecified: Secondary | ICD-10-CM | POA: Diagnosis not present

## 2014-01-02 DIAGNOSIS — M6281 Muscle weakness (generalized): Secondary | ICD-10-CM | POA: Diagnosis not present

## 2014-01-02 DIAGNOSIS — F0280 Dementia in other diseases classified elsewhere without behavioral disturbance: Secondary | ICD-10-CM | POA: Diagnosis not present

## 2014-01-02 DIAGNOSIS — G309 Alzheimer's disease, unspecified: Secondary | ICD-10-CM | POA: Diagnosis not present

## 2014-01-02 DIAGNOSIS — N189 Chronic kidney disease, unspecified: Secondary | ICD-10-CM | POA: Diagnosis not present

## 2014-01-05 ENCOUNTER — Ambulatory Visit (INDEPENDENT_AMBULATORY_CARE_PROVIDER_SITE_OTHER): Payer: Medicare Other | Admitting: Internal Medicine

## 2014-01-05 ENCOUNTER — Encounter: Payer: Self-pay | Admitting: Internal Medicine

## 2014-01-05 VITALS — BP 126/84 | HR 58 | Temp 98.0°F | Resp 12 | Ht 64.0 in | Wt 195.0 lb

## 2014-01-05 DIAGNOSIS — N189 Chronic kidney disease, unspecified: Secondary | ICD-10-CM | POA: Diagnosis not present

## 2014-01-05 DIAGNOSIS — F329 Major depressive disorder, single episode, unspecified: Secondary | ICD-10-CM | POA: Diagnosis not present

## 2014-01-05 DIAGNOSIS — G309 Alzheimer's disease, unspecified: Secondary | ICD-10-CM | POA: Diagnosis not present

## 2014-01-05 DIAGNOSIS — Z23 Encounter for immunization: Secondary | ICD-10-CM

## 2014-01-05 DIAGNOSIS — I129 Hypertensive chronic kidney disease with stage 1 through stage 4 chronic kidney disease, or unspecified chronic kidney disease: Secondary | ICD-10-CM | POA: Diagnosis not present

## 2014-01-05 DIAGNOSIS — R635 Abnormal weight gain: Secondary | ICD-10-CM

## 2014-01-05 DIAGNOSIS — H409 Unspecified glaucoma: Secondary | ICD-10-CM | POA: Diagnosis not present

## 2014-01-05 DIAGNOSIS — F0280 Dementia in other diseases classified elsewhere without behavioral disturbance: Secondary | ICD-10-CM | POA: Diagnosis not present

## 2014-01-05 DIAGNOSIS — I10 Essential (primary) hypertension: Secondary | ICD-10-CM

## 2014-01-05 DIAGNOSIS — M5442 Lumbago with sciatica, left side: Secondary | ICD-10-CM

## 2014-01-05 DIAGNOSIS — M6281 Muscle weakness (generalized): Secondary | ICD-10-CM | POA: Diagnosis not present

## 2014-01-05 NOTE — Patient Instructions (Signed)
May use warm compress over left sacroiliac area/upper buttock.  Continue tylenol and tramadol for pain and, most importantly, therapy 3x per week.

## 2014-01-05 NOTE — Progress Notes (Signed)
Patient ID: Michelle [name], female   DOB: Oct 07, 1920, 80 y.o.   MRN: [id number]   Location:  [facility] [place] [facility]  Code Status: need to discuss advance directives next time  Allergies  Allergen Reactions  . Codeine     States she blacks out    Chief Complaint  Patient presents with  . Medical Management of Chronic Issues    3 month follow-up, no recent labs   . Flank Pain    Left lower side- ? pulled muscle     HPI: Patient is a 80 y.o.  female seen in the office today for med mgt of chronic diseases and pain in her left hip.    About a week ago, twisted the wrong way an now has pain in her left side above her hip. Happened when getting out of bed.  Still hurts.  No pain if sitting still.  If gets up and moves around it hurts.  Had negative UA.  Doing therapy three times weekly.  Has not fallen for several months (and not related to this pain). No topicals.  Has been using her tramadol and tylenol.  Three times daily.  These are helpful.  Not always taking 3x per day, but does always take the evening dose for sure.  A  Is gaining weight and doesn't like it. Drinks a lot of water, she says.    Eyes are bad.  Have been progressing.  Right one is very bad. [facility] ophthalmology.  Next appt 03/17/14 Dr. Charlotte Sanes.  Sees about q 3 mos.    Mood is good.  She is not depressed.    Bowels are good lately. Once in a while, two days of diarrhea, then stops.  Loves salad.  Uses bleu cheese dressing.    Review of Systems:  Review of Systems  Constitutional: Negative for fever.  HENT: Positive for hearing loss.   Eyes: Positive for blurred vision.  Respiratory: Negative for shortness of breath.   Cardiovascular: Negative for chest pain.  Gastrointestinal: Positive for diarrhea. Negative for abdominal pain, constipation, blood in stool and melena.  Genitourinary: Positive for urgency. Negative for dysuria and frequency.  Musculoskeletal: Positive for  myalgias and back pain. Negative for falls.  Skin: Negative for rash.  Neurological: Positive for weakness. Negative for dizziness and headaches.  Endo/Heme/Allergies: Bruises/bleeds easily.  Psychiatric/Behavioral: Positive for memory loss. Negative for depression.     Past Medical History  Diagnosis Date  . Hypertension   . Hyperlipidemia LDL goal < 100   . Polymyalgia rheumatica   . GERD (gastroesophageal reflux disease)   . Irritable bowel syndrome   . Diverticulitis   . Sciatica   . Vertigo   . Edema of leg   . Glaucoma   . Appendicitis   . Arthritis   . Acute upper respiratory infections of unspecified site   . Urge incontinence   . Other specified symptom associated with female genital organs   . Regional enteritis of unspecified site   . Sebaceous cyst   . Depression   . Other malaise and fatigue   . Abnormal weight gain   . Leukocytopenia, unspecified   . Dementia in conditions classified elsewhere without behavioral disturbance   . Anemia, unspecified   . Chronic kidney disease, stage III (moderate)   . Unspecified arthropathy, shoulder region   . Other B-complex deficiencies   . Unspecified vitamin D deficiency   . Memory loss   . Abnormality of gait   .  Personal history of fall   . Malignant neoplasm of breast (female), unspecified site   . Unspecified glaucoma   . Diverticulosis of colon (without mention of hemorrhage)   . Irritable bowel syndrome   . Sciatica   . Dizziness and giddiness     Past Surgical History  Procedure Laterality Date  . Nasal sinus surgery  1966  . Cholecystectomy  1972  . Cataract extraction  1990  . Breast surgery      R breast lumpectomy for benign disease  . Breast lumpectomy  10/28/10    left breast     Social History:   reports that she has never smoked. She has never used smokeless tobacco. She reports that she does not drink alcohol or use illicit drugs.  Family History  Problem Relation Age of Onset  . Stroke  Father   . Hypertension Mother   . Stroke Mother   . Breast cancer Mother   . Heart attack Brother   . Cancer Brother   . Stroke Brother   . Heart attack Son   . Colon cancer Sister     Medications: Patient's Medications  New Prescriptions   No medications on file  Previous Medications   AMLODIPINE (NORVASC) 5 MG TABLET    TAKE 1 TABLET DAILY.   ASPIRIN 81 MG CHEWABLE TABLET    Chew 81 mg by mouth daily.    BIMATOPROST (LUMIGAN) 0.01 % SOLN    Place 1 drop into both eyes at bedtime.    BRIMONIDINE-TIMOLOL (COMBIGAN) 0.2-0.5 % OPHTHALMIC SOLUTION    Place 1 drop into both eyes 2 (two) times daily.    CALCIUM CARBONATE 200 MG CAPSULE    Take 200 mg by mouth daily.    CITALOPRAM (CELEXA) 10 MG TABLET    TAKE 1 TABLET ONCE DAILY.   DORZOLAMIDE (TRUSOPT) 2 % OPHTHALMIC SOLUTION    Place 1 drop into both eyes 2 (two) times daily.     LIDOCAINE (LIDODERM) 5 %    Apply one patch to each shoulder, keep on 12 hours, then keep off for 12 hours for pain   MULTIPLE VITAMIN (MULTIVITAMIN WITH MINERALS) TABS    Take 1 tablet by mouth daily.   TRAMADOL (ULTRAM) 50 MG TABLET    Take one tablet by mouth three times daily as needed for pain  Modified Medications   No medications on file  Discontinued Medications   MOMETASONE (NASONEX) 50 MCG/ACT NASAL SPRAY    Place 2 sprays into the nose as needed.   PHENAZOPYRIDINE (PYRIDIUM) 97 MG TABLET    Take 1 tablet (97 mg total) by mouth 3 (three) times daily as needed for pain.   SULFAMETHOXAZOLE-TRIMETHOPRIM (BACTRIM DS,SEPTRA DS) 800-160 MG PER TABLET    Take 1 tablet by mouth 2 (two) times daily.     Physical Exam: Filed Vitals:   01/05/14 1042  BP: 126/84  Pulse: 58  Temp: 98 F (36.7 C)  TempSrc: Oral  Resp: 12  Height: 5\' 4"  (1.626 m)  Weight: 195 lb (88.451 kg)  SpO2: 98%  Physical Exam  Constitutional: She appears well-developed and well-nourished. No distress.  Cardiovascular: Normal rate, regular rhythm, normal heart sounds and  intact distal pulses.   Pulmonary/Chest: Effort normal and breath sounds normal. No respiratory distress.  Abdominal: Soft. Bowel sounds are normal. She exhibits no distension and no mass. There is no tenderness.  Musculoskeletal: Normal range of motion.  Difficulty standing on her own;   Neurological: She is alert.  Skin: Skin is warm and dry.     Labs reviewed: Basic Metabolic Panel:  Recent Labs  28/41/32 1215 07/14/13 1150 11/10/13 1153  NA 142 141 142  K 4.4 4.5 4.7  CL 106 104  --   CO2 24 26 26   GLUCOSE 96 94 94  BUN 19 22 24.1  CREATININE 1.33* 1.21* 1.2*  CALCIUM 9.3 9.4 9.6   Liver Function Tests:  Recent Labs  07/14/13 1150 11/10/13 1153  AST 18 17  ALT 11 13  ALKPHOS 93 66  BILITOT 0.7 0.63  PROT 7.1 6.9  ALBUMIN 3.3* 3.3*  CBC:  Recent Labs  07/14/13 1150 11/10/13 1153  WBC 3.6* 3.6*  NEUTROABS 2.3 2.0  HGB 10.8* 10.9*  HCT 34.7* 34.5*  MCV 94.6 93.6  PLT 172 178   Assessment/Plan 1. Left-sided low back pain with left-sided sciatica - cont PT for this and exercises they recommend, may apply warm compresses -cont tylenol and tramadol up to tid prn pain  2. Essential hypertension, benign -bp at goal with current therapy and low sodium diet  3. Glaucoma -following with Dr. Charlotte Sanes at [facility] ophtho every three mos -cont drops per her recommendations and avoid anticholinergics that will interfere  4. Alzheimer's dementia -is moderate stage--pt still able to provide majority of history herself  5. Weight gain -due to excellent appetite -does not have any evidence of volume overload  6. Need for vaccination with 13-polyvalent pneumococcal conjugate vaccine - Pneumococcal conjugate vaccine 13-valent given   Labs/tests ordered:  Will check labs at next appt Next appt:  3 mos  Kip Kautzman L. Carly Applegate, D.O. Geriatrics [facility] [address], [place] [address] Cell Phone (Mon-Fri 8am-5pm):   [phone] On Call:  [phone] & follow prompts after 5pm & weekends Office Phone:  [phone] Office Fax:  [phone]

## 2014-01-06 DIAGNOSIS — F0280 Dementia in other diseases classified elsewhere without behavioral disturbance: Secondary | ICD-10-CM | POA: Diagnosis not present

## 2014-01-06 DIAGNOSIS — F329 Major depressive disorder, single episode, unspecified: Secondary | ICD-10-CM | POA: Diagnosis not present

## 2014-01-06 DIAGNOSIS — M6281 Muscle weakness (generalized): Secondary | ICD-10-CM | POA: Diagnosis not present

## 2014-01-06 DIAGNOSIS — I129 Hypertensive chronic kidney disease with stage 1 through stage 4 chronic kidney disease, or unspecified chronic kidney disease: Secondary | ICD-10-CM | POA: Diagnosis not present

## 2014-01-06 DIAGNOSIS — N189 Chronic kidney disease, unspecified: Secondary | ICD-10-CM | POA: Diagnosis not present

## 2014-01-06 DIAGNOSIS — G309 Alzheimer's disease, unspecified: Secondary | ICD-10-CM | POA: Diagnosis not present

## 2014-01-07 DIAGNOSIS — I129 Hypertensive chronic kidney disease with stage 1 through stage 4 chronic kidney disease, or unspecified chronic kidney disease: Secondary | ICD-10-CM | POA: Diagnosis not present

## 2014-01-07 DIAGNOSIS — F329 Major depressive disorder, single episode, unspecified: Secondary | ICD-10-CM | POA: Diagnosis not present

## 2014-01-07 DIAGNOSIS — N189 Chronic kidney disease, unspecified: Secondary | ICD-10-CM | POA: Diagnosis not present

## 2014-01-07 DIAGNOSIS — G309 Alzheimer's disease, unspecified: Secondary | ICD-10-CM | POA: Diagnosis not present

## 2014-01-07 DIAGNOSIS — F0280 Dementia in other diseases classified elsewhere without behavioral disturbance: Secondary | ICD-10-CM | POA: Diagnosis not present

## 2014-01-07 DIAGNOSIS — M6281 Muscle weakness (generalized): Secondary | ICD-10-CM | POA: Diagnosis not present

## 2014-01-08 DIAGNOSIS — N189 Chronic kidney disease, unspecified: Secondary | ICD-10-CM | POA: Diagnosis not present

## 2014-01-08 DIAGNOSIS — I129 Hypertensive chronic kidney disease with stage 1 through stage 4 chronic kidney disease, or unspecified chronic kidney disease: Secondary | ICD-10-CM | POA: Diagnosis not present

## 2014-01-08 DIAGNOSIS — G309 Alzheimer's disease, unspecified: Secondary | ICD-10-CM | POA: Diagnosis not present

## 2014-01-08 DIAGNOSIS — M6281 Muscle weakness (generalized): Secondary | ICD-10-CM | POA: Diagnosis not present

## 2014-01-08 DIAGNOSIS — F0280 Dementia in other diseases classified elsewhere without behavioral disturbance: Secondary | ICD-10-CM | POA: Diagnosis not present

## 2014-01-08 DIAGNOSIS — F329 Major depressive disorder, single episode, unspecified: Secondary | ICD-10-CM | POA: Diagnosis not present

## 2014-01-13 ENCOUNTER — Other Ambulatory Visit: Payer: Self-pay | Admitting: Internal Medicine

## 2014-01-13 DIAGNOSIS — I129 Hypertensive chronic kidney disease with stage 1 through stage 4 chronic kidney disease, or unspecified chronic kidney disease: Secondary | ICD-10-CM | POA: Diagnosis not present

## 2014-01-13 DIAGNOSIS — N189 Chronic kidney disease, unspecified: Secondary | ICD-10-CM | POA: Diagnosis not present

## 2014-01-13 DIAGNOSIS — F0280 Dementia in other diseases classified elsewhere without behavioral disturbance: Secondary | ICD-10-CM | POA: Diagnosis not present

## 2014-01-13 DIAGNOSIS — M6281 Muscle weakness (generalized): Secondary | ICD-10-CM | POA: Diagnosis not present

## 2014-01-13 DIAGNOSIS — F329 Major depressive disorder, single episode, unspecified: Secondary | ICD-10-CM | POA: Diagnosis not present

## 2014-01-13 DIAGNOSIS — G309 Alzheimer's disease, unspecified: Secondary | ICD-10-CM | POA: Diagnosis not present

## 2014-01-14 DIAGNOSIS — G309 Alzheimer's disease, unspecified: Secondary | ICD-10-CM | POA: Diagnosis not present

## 2014-01-14 DIAGNOSIS — F0280 Dementia in other diseases classified elsewhere without behavioral disturbance: Secondary | ICD-10-CM | POA: Diagnosis not present

## 2014-01-14 DIAGNOSIS — M6281 Muscle weakness (generalized): Secondary | ICD-10-CM | POA: Diagnosis not present

## 2014-01-14 DIAGNOSIS — N189 Chronic kidney disease, unspecified: Secondary | ICD-10-CM | POA: Diagnosis not present

## 2014-01-14 DIAGNOSIS — I129 Hypertensive chronic kidney disease with stage 1 through stage 4 chronic kidney disease, or unspecified chronic kidney disease: Secondary | ICD-10-CM | POA: Diagnosis not present

## 2014-01-14 DIAGNOSIS — F329 Major depressive disorder, single episode, unspecified: Secondary | ICD-10-CM | POA: Diagnosis not present

## 2014-01-16 DIAGNOSIS — M6281 Muscle weakness (generalized): Secondary | ICD-10-CM | POA: Diagnosis not present

## 2014-01-16 DIAGNOSIS — G309 Alzheimer's disease, unspecified: Secondary | ICD-10-CM | POA: Diagnosis not present

## 2014-01-16 DIAGNOSIS — F0280 Dementia in other diseases classified elsewhere without behavioral disturbance: Secondary | ICD-10-CM | POA: Diagnosis not present

## 2014-01-16 DIAGNOSIS — F329 Major depressive disorder, single episode, unspecified: Secondary | ICD-10-CM | POA: Diagnosis not present

## 2014-01-16 DIAGNOSIS — N189 Chronic kidney disease, unspecified: Secondary | ICD-10-CM | POA: Diagnosis not present

## 2014-01-16 DIAGNOSIS — I129 Hypertensive chronic kidney disease with stage 1 through stage 4 chronic kidney disease, or unspecified chronic kidney disease: Secondary | ICD-10-CM | POA: Diagnosis not present

## 2014-01-16 NOTE — Telephone Encounter (Signed)
per pof to sch pt appt-sch and cld cell #

## 2014-01-18 ENCOUNTER — Emergency Department (HOSPITAL_COMMUNITY): Payer: Medicare Other

## 2014-01-18 ENCOUNTER — Encounter (HOSPITAL_COMMUNITY): Payer: Self-pay | Admitting: Emergency Medicine

## 2014-01-18 ENCOUNTER — Emergency Department (HOSPITAL_COMMUNITY)
Admission: EM | Admit: 2014-01-18 | Discharge: 2014-01-19 | Disposition: A | Discharge status: Home / Self Care | Payer: Medicare Other | Attending: Emergency Medicine | Admitting: Emergency Medicine

## 2014-01-18 DIAGNOSIS — R0602 Shortness of breath: Secondary | ICD-10-CM | POA: Insufficient documentation

## 2014-01-18 DIAGNOSIS — F0390 Unspecified dementia without behavioral disturbance: Secondary | ICD-10-CM | POA: Insufficient documentation

## 2014-01-18 DIAGNOSIS — F329 Major depressive disorder, single episode, unspecified: Secondary | ICD-10-CM | POA: Diagnosis not present

## 2014-01-18 DIAGNOSIS — Z853 Personal history of malignant neoplasm of breast: Secondary | ICD-10-CM | POA: Insufficient documentation

## 2014-01-18 DIAGNOSIS — I10 Essential (primary) hypertension: Secondary | ICD-10-CM

## 2014-01-18 DIAGNOSIS — R51 Headache: Secondary | ICD-10-CM | POA: Diagnosis not present

## 2014-01-18 DIAGNOSIS — K219 Gastro-esophageal reflux disease without esophagitis: Secondary | ICD-10-CM | POA: Insufficient documentation

## 2014-01-18 DIAGNOSIS — Z9181 History of falling: Secondary | ICD-10-CM | POA: Diagnosis not present

## 2014-01-18 DIAGNOSIS — Z79899 Other long term (current) drug therapy: Secondary | ICD-10-CM | POA: Insufficient documentation

## 2014-01-18 DIAGNOSIS — E785 Hyperlipidemia, unspecified: Secondary | ICD-10-CM | POA: Diagnosis not present

## 2014-01-18 DIAGNOSIS — H409 Unspecified glaucoma: Secondary | ICD-10-CM | POA: Insufficient documentation

## 2014-01-18 DIAGNOSIS — M1990 Unspecified osteoarthritis, unspecified site: Secondary | ICD-10-CM | POA: Insufficient documentation

## 2014-01-18 DIAGNOSIS — R519 Headache, unspecified: Secondary | ICD-10-CM

## 2014-01-18 DIAGNOSIS — N183 Chronic kidney disease, stage 3 (moderate): Secondary | ICD-10-CM | POA: Insufficient documentation

## 2014-01-18 DIAGNOSIS — Z862 Personal history of diseases of the blood and blood-forming organs and certain disorders involving the immune mechanism: Secondary | ICD-10-CM | POA: Diagnosis not present

## 2014-01-18 DIAGNOSIS — I129 Hypertensive chronic kidney disease with stage 1 through stage 4 chronic kidney disease, or unspecified chronic kidney disease: Secondary | ICD-10-CM | POA: Diagnosis not present

## 2014-01-18 DIAGNOSIS — Z7982 Long term (current) use of aspirin: Secondary | ICD-10-CM | POA: Insufficient documentation

## 2014-01-18 LAB — I-STAT CHEM 8, ED
BUN: 27 mg/dL — AB (ref 6–23)
CALCIUM ION: 1.19 mmol/L (ref 1.13–1.30)
CREATININE: 1.4 mg/dL — AB (ref 0.50–1.10)
Chloride: 105 mEq/L (ref 96–112)
GLUCOSE: 137 mg/dL — AB (ref 70–99)
HCT: 35 % — ABNORMAL LOW (ref 36.0–46.0)
HEMOGLOBIN: 11.9 g/dL — AB (ref 12.0–15.0)
Potassium: 4.3 mEq/L (ref 3.7–5.3)
SODIUM: 142 meq/L (ref 137–147)
TCO2: 22 mmol/L (ref 0–100)

## 2014-01-18 LAB — I-STAT TROPONIN, ED: Troponin i, poc: 0 ng/mL (ref 0.00–0.08)

## 2014-01-18 MED ORDER — ACETAMINOPHEN 325 MG PO TABS
650.0000 mg | ORAL_TABLET | Freq: Once | ORAL | Status: AC
  Administered 2014-01-18: 650 mg via ORAL
  Filled 2014-01-18: qty 2

## 2014-01-18 NOTE — ED Notes (Signed)
Headache at 1830 tonight, aide checked BP and found it to be 190/100.

## 2014-01-18 NOTE — ED Notes (Signed)
Daughter -  Junita Push [phone]

## 2014-01-18 NOTE — ED Notes (Signed)
Bed: WA08 Expected date:  Expected time:  Means of arrival:  Comments: EMS 80yo F, headache, HTN

## 2014-01-18 NOTE — ED Provider Notes (Signed)
CSN: [id number]     Arrival date & time 01/18/14  2114 History   First MD Initiated Contact with Patient 01/18/14 2131     Chief Complaint  Patient presents with  . Hypertension  . Headache     (Consider location/radiation/quality/duration/timing/severity/associated sxs/prior Treatment) Patient is a 80 y.o. female presenting with hypertension and headaches. The history is provided by the patient.  Hypertension This is a chronic problem. The current episode started 1 to 2 hours ago. The problem occurs constantly. The problem has been gradually improving. Associated symptoms include headaches and shortness of breath (occasionally but not currently). Pertinent negatives include no chest pain and no abdominal pain. Nothing aggravates the symptoms. Nothing relieves the symptoms. She has tried nothing for the symptoms. The treatment provided significant relief.  Headache Pain location:  Occipital Quality:  Dull Radiates to:  Does not radiate Severity currently:  3/10 Severity at highest:  7/10 Onset quality:  Gradual Duration:  3 days Timing:  Intermittent Progression:  Waxing and waning Chronicity:  Recurrent Similar to prior headaches: yes   Context comment:  At rest Relieved by: tramadol. Worsened by:  Nothing tried Ineffective treatments:  None tried Associated symptoms: no abdominal pain, no back pain, no congestion, no cough, no diarrhea, no dizziness, no pain, no fatigue, no fever, no nausea, no neck pain and no vomiting     Past Medical History  Diagnosis Date  . Hypertension   . Hyperlipidemia LDL goal < 100   . Polymyalgia rheumatica   . GERD (gastroesophageal reflux disease)   . Irritable bowel syndrome   . Diverticulitis   . Sciatica   . Vertigo   . Edema of leg   . Glaucoma   . Appendicitis   . Arthritis   . Acute upper respiratory infections of unspecified site   . Urge incontinence   . Other specified symptom associated with female genital organs   .  Regional enteritis of unspecified site   . Sebaceous cyst   . Depression   . Other malaise and fatigue   . Abnormal weight gain   . Leukocytopenia, unspecified   . Dementia in conditions classified elsewhere without behavioral disturbance   . Anemia, unspecified   . Chronic kidney disease, stage III (moderate)   . Unspecified arthropathy, shoulder region   . Other B-complex deficiencies   . Unspecified vitamin D deficiency   . Memory loss   . Abnormality of gait   . Personal history of fall   . Malignant neoplasm of breast (female), unspecified site   . Unspecified glaucoma   . Diverticulosis of colon (without mention of hemorrhage)   . Irritable bowel syndrome   . Sciatica   . Dizziness and giddiness    Past Surgical History  Procedure Laterality Date  . Nasal sinus surgery  1966  . Cholecystectomy  1972  . Cataract extraction  1990  . Breast surgery      R breast lumpectomy for benign disease  . Breast lumpectomy  10/28/10    left breast    Family History  Problem Relation Age of Onset  . Stroke Father   . Hypertension Mother   . Stroke Mother   . Breast cancer Mother   . Heart attack Brother   . Cancer Brother   . Stroke Brother   . Heart attack Son   . Colon cancer Sister    History  Substance Use Topics  . Smoking status: Never Smoker   . Smokeless tobacco: Never  Used  . Alcohol Use: No   OB History    No data available     Review of Systems  Constitutional: Negative for fever and fatigue.  HENT: Negative for congestion and drooling.   Eyes: Negative for pain.  Respiratory: Positive for shortness of breath (occasionally but not currently). Negative for cough.   Cardiovascular: Negative for chest pain.  Gastrointestinal: Negative for nausea, vomiting, abdominal pain and diarrhea.  Genitourinary: Negative for dysuria and hematuria.  Musculoskeletal: Negative for back pain, gait problem and neck pain.  Skin: Negative for color change.  Neurological:  Positive for headaches. Negative for dizziness.  Hematological: Negative for adenopathy.  Psychiatric/Behavioral: Negative for behavioral problems.  All other systems reviewed and are negative.     Allergies  Codeine  Home Medications   Prior to Admission medications   Medication Sig Start Date End Date Taking? Authorizing Provider  amLODipine (NORVASC) 5 MG tablet TAKE 1 TABLET DAILY. 09/09/13  Yes Tiffany L Reed, DO  aspirin 81 MG chewable tablet Chew 81 mg by mouth daily.    Yes Historical Provider, MD  bimatoprost (LUMIGAN) 0.01 % SOLN Place 1 drop into both eyes at bedtime.    Yes Historical Provider, MD  brimonidine-timolol (COMBIGAN) 0.2-0.5 % ophthalmic solution Place 1 drop into both eyes 2 (two) times daily.    Yes Historical Provider, MD  calcium carbonate 200 MG capsule Take 200 mg by mouth daily.    Yes Historical Provider, MD  citalopram (CELEXA) 10 MG tablet TAKE 1 TABLET ONCE DAILY. 10/17/13  Yes Tiffany L Reed, DO  dorzolamide (TRUSOPT) 2 % ophthalmic solution Place 1 drop into both eyes 2 (two) times daily.     Yes Historical Provider, MD  lidocaine (LIDODERM) 5 % Apply one patch to each shoulder, keep on 12 hours, then keep off for 12 hours for pain 12/03/13  Yes Estill Dooms, MD  Multiple Vitamin (MULTIVITAMIN WITH MINERALS) TABS Take 1 tablet by mouth daily.   Yes Historical Provider, MD  traMADol (ULTRAM) 50 MG tablet TAKE 1 TABLET 3 TIMES DAILY AS NEEDED FOR PAIN. 01/13/14  Yes Tiffany L Reed, DO   BP 167/79 mmHg  Pulse 83  Temp(Src) 98.2 F (36.8 C) (Oral)  Resp 16  SpO2 98% Physical Exam  Constitutional: She is oriented to person, place, and time. She appears well-developed and well-nourished.  HENT:  Head: Normocephalic and atraumatic.  Mouth/Throat: Oropharynx is clear and moist. No oropharyngeal exudate.  Eyes: Conjunctivae and EOM are normal. Pupils are equal, round, and reactive to light.  Neck: Normal range of motion. Neck supple.  Cardiovascular:  Normal rate, regular rhythm, normal heart sounds and intact distal pulses.  Exam reveals no gallop and no friction rub.   No murmur heard. Pulmonary/Chest: Effort normal and breath sounds normal. No respiratory distress. She has no wheezes.  Abdominal: Soft. Bowel sounds are normal. There is no tenderness. There is no rebound and no guarding.  Musculoskeletal: Normal range of motion. She exhibits no edema or tenderness.  Neurological: She is alert and oriented to person, place, and time.  alert, oriented x3 speech: normal in context and clarity memory: intact grossly cranial nerves II-XII: intact motor strength: full proximally and distally no involuntary movements or tremors sensation: intact to light touch diffusely  cerebellar: finger-to-nose intact gait: able to ambulate w/ walker at baseline  Skin: Skin is warm and dry.  Psychiatric: She has a normal mood and affect. Her behavior is normal.  Nursing note and  vitals reviewed.   ED Course  Procedures (including critical care time) Labs Review Labs Reviewed  I-STAT CHEM 8, ED - Abnormal; Notable for the following:    BUN 27 (*)    Creatinine, Ser 1.40 (*)    Glucose, Bld 137 (*)    Hemoglobin 11.9 (*)    HCT 35.0 (*)    All other components within normal limits  I-STAT TROPOININ, ED    Imaging Review Ct Head Wo Contrast  01/19/2014   CLINICAL DATA:  Acute onset of occipital headache. High blood pressure. Initial encounter.  EXAM: CT HEAD WITHOUT CONTRAST  TECHNIQUE: Contiguous axial images were obtained from the base of the skull through the vertex without intravenous contrast.  COMPARISON:  CT of the head performed 08/28/2012  FINDINGS: There is no evidence of acute infarction, mass lesion, or intra- or extra-axial hemorrhage on CT.  Prominence of the ventricles and sulci reflects mild to moderate cortical volume loss. Cerebellar atrophy is noted. Scattered periventricular and subcortical white matter change likely reflects  small vessel ischemic microangiopathy.  The brainstem and fourth ventricle are within normal limits. The basal ganglia are unremarkable in appearance. The cerebral hemispheres demonstrate grossly normal gray-white differentiation. No mass effect or midline shift is seen.  There is no evidence of fracture; visualized osseous structures are unremarkable in appearance. The orbits are within normal limits. The patient is status post right-sided maxillary antrectomy, and right-sided sphenoid antrostomy. The paranasal sinuses and mastoid air cells are well-aerated. No significant soft tissue abnormalities are seen.  IMPRESSION: 1. No acute intracranial pathology seen on CT. 2. Mild to moderate cortical volume loss and scattered small vessel ischemic microangiopathy.   Electronically Signed   By: Garald Balding M.D.   On: 01/19/2014 00:11     EKG Interpretation   Date/Time:  Sunday January 18 2014 22:09:54 EST Ventricular Rate:  58 PR Interval:  206 QRS Duration: 99 QT Interval:  460 QTC Calculation: 452 R Axis:   -3 Text Interpretation:  Sinus arrhythmia Ventricular premature complex  Abnormal R-wave progression, early transition Probable left ventricular  hypertrophy No significant change since last tracing Confirmed by Eletha Culbertson   MD, Kasey Ewings ([id number]) on 01/18/2014 10:48:15 PM      MDM   Final diagnoses:  HA (headache)  Essential hypertension    9:50 PM 80 y.o. female w hx of HTN who presents with an episode of high blood pressure which occurred around 7:30 PM this evening. On routine blood pressure check at her facility she was found to have a blood pressure of 190/96. EMS was called and she was having headache at the time and her blood pressure for EMS per the caregiver was 190/100. The patient states that she has had a waxing waning occipital headache over the last few days. She has had similar headaches previously. It is spontaneously resolving and is currently 3 out of 10. She denies any  chest pain or shortness of breath on exam. She appears well. Her vital signs are unremarkable here with only mild hypertension. We'll get screening labs imaging and Tylenol for headache. The patient is neurologically intact.   Cr proximal to baseline. Pt cont to appear well. Other labs/imaging non-contrib. Pt's HA gone, BP has dec spontaneously.  I have discussed the diagnosis/risks/treatment options with the patient and caregiver and believe the pt to be eligible for discharge home to follow-up with her pcp as needed. We also discussed returning to the ED immediately if new or worsening sx occur. We  discussed the sx which are most concerning (e.g., worsening HA, fever, cp, sob) that necessitate immediate return. Medications administered to the patient during their visit and any new prescriptions provided to the patient are listed below.  Medications given during this visit Medications  acetaminophen (TYLENOL) tablet 650 mg (650 mg Oral Given 01/18/14 2231)    Discharge Medication List as of 01/19/2014 12:22 AM       Pamella Pert, MD 01/20/14 1627

## 2014-01-19 DIAGNOSIS — F329 Major depressive disorder, single episode, unspecified: Secondary | ICD-10-CM | POA: Diagnosis not present

## 2014-01-19 DIAGNOSIS — N189 Chronic kidney disease, unspecified: Secondary | ICD-10-CM | POA: Diagnosis not present

## 2014-01-19 DIAGNOSIS — F0280 Dementia in other diseases classified elsewhere without behavioral disturbance: Secondary | ICD-10-CM | POA: Diagnosis not present

## 2014-01-19 DIAGNOSIS — G309 Alzheimer's disease, unspecified: Secondary | ICD-10-CM | POA: Diagnosis not present

## 2014-01-19 DIAGNOSIS — I129 Hypertensive chronic kidney disease with stage 1 through stage 4 chronic kidney disease, or unspecified chronic kidney disease: Secondary | ICD-10-CM | POA: Diagnosis not present

## 2014-01-19 DIAGNOSIS — I10 Essential (primary) hypertension: Secondary | ICD-10-CM | POA: Diagnosis not present

## 2014-01-19 DIAGNOSIS — R51 Headache: Secondary | ICD-10-CM | POA: Diagnosis not present

## 2014-01-19 DIAGNOSIS — M6281 Muscle weakness (generalized): Secondary | ICD-10-CM | POA: Diagnosis not present

## 2014-01-19 NOTE — ED Notes (Signed)
Patient transported to CT 

## 2014-01-21 DIAGNOSIS — I129 Hypertensive chronic kidney disease with stage 1 through stage 4 chronic kidney disease, or unspecified chronic kidney disease: Secondary | ICD-10-CM | POA: Diagnosis not present

## 2014-01-21 DIAGNOSIS — M6281 Muscle weakness (generalized): Secondary | ICD-10-CM | POA: Diagnosis not present

## 2014-01-21 DIAGNOSIS — G309 Alzheimer's disease, unspecified: Secondary | ICD-10-CM | POA: Diagnosis not present

## 2014-01-21 DIAGNOSIS — F329 Major depressive disorder, single episode, unspecified: Secondary | ICD-10-CM | POA: Diagnosis not present

## 2014-01-21 DIAGNOSIS — F0280 Dementia in other diseases classified elsewhere without behavioral disturbance: Secondary | ICD-10-CM | POA: Diagnosis not present

## 2014-01-21 DIAGNOSIS — N189 Chronic kidney disease, unspecified: Secondary | ICD-10-CM | POA: Diagnosis not present

## 2014-01-26 ENCOUNTER — Telehealth: Payer: Self-pay | Admitting: Oncology

## 2014-01-26 DIAGNOSIS — I129 Hypertensive chronic kidney disease with stage 1 through stage 4 chronic kidney disease, or unspecified chronic kidney disease: Secondary | ICD-10-CM | POA: Diagnosis not present

## 2014-01-26 DIAGNOSIS — G309 Alzheimer's disease, unspecified: Secondary | ICD-10-CM | POA: Diagnosis not present

## 2014-01-26 DIAGNOSIS — F329 Major depressive disorder, single episode, unspecified: Secondary | ICD-10-CM | POA: Diagnosis not present

## 2014-01-26 DIAGNOSIS — M6281 Muscle weakness (generalized): Secondary | ICD-10-CM | POA: Diagnosis not present

## 2014-01-26 DIAGNOSIS — F0280 Dementia in other diseases classified elsewhere without behavioral disturbance: Secondary | ICD-10-CM | POA: Diagnosis not present

## 2014-01-26 DIAGNOSIS — N189 Chronic kidney disease, unspecified: Secondary | ICD-10-CM | POA: Diagnosis not present

## 2014-01-26 NOTE — Telephone Encounter (Signed)
Lvm advising appt chg from 1/25 to 3/29 @ 3pm. Also mailed appt calendar.

## 2014-01-28 DIAGNOSIS — M6281 Muscle weakness (generalized): Secondary | ICD-10-CM | POA: Diagnosis not present

## 2014-01-28 DIAGNOSIS — F329 Major depressive disorder, single episode, unspecified: Secondary | ICD-10-CM | POA: Diagnosis not present

## 2014-01-28 DIAGNOSIS — F0280 Dementia in other diseases classified elsewhere without behavioral disturbance: Secondary | ICD-10-CM | POA: Diagnosis not present

## 2014-01-28 DIAGNOSIS — G309 Alzheimer's disease, unspecified: Secondary | ICD-10-CM | POA: Diagnosis not present

## 2014-01-28 DIAGNOSIS — N189 Chronic kidney disease, unspecified: Secondary | ICD-10-CM | POA: Diagnosis not present

## 2014-01-28 DIAGNOSIS — I129 Hypertensive chronic kidney disease with stage 1 through stage 4 chronic kidney disease, or unspecified chronic kidney disease: Secondary | ICD-10-CM | POA: Diagnosis not present

## 2014-02-02 DIAGNOSIS — M6281 Muscle weakness (generalized): Secondary | ICD-10-CM | POA: Diagnosis not present

## 2014-02-02 DIAGNOSIS — I129 Hypertensive chronic kidney disease with stage 1 through stage 4 chronic kidney disease, or unspecified chronic kidney disease: Secondary | ICD-10-CM | POA: Diagnosis not present

## 2014-02-02 DIAGNOSIS — G309 Alzheimer's disease, unspecified: Secondary | ICD-10-CM | POA: Diagnosis not present

## 2014-02-02 DIAGNOSIS — F0280 Dementia in other diseases classified elsewhere without behavioral disturbance: Secondary | ICD-10-CM | POA: Diagnosis not present

## 2014-02-02 DIAGNOSIS — F329 Major depressive disorder, single episode, unspecified: Secondary | ICD-10-CM | POA: Diagnosis not present

## 2014-02-02 DIAGNOSIS — N189 Chronic kidney disease, unspecified: Secondary | ICD-10-CM | POA: Diagnosis not present

## 2014-02-04 DIAGNOSIS — N189 Chronic kidney disease, unspecified: Secondary | ICD-10-CM | POA: Diagnosis not present

## 2014-02-04 DIAGNOSIS — F329 Major depressive disorder, single episode, unspecified: Secondary | ICD-10-CM | POA: Diagnosis not present

## 2014-02-04 DIAGNOSIS — I129 Hypertensive chronic kidney disease with stage 1 through stage 4 chronic kidney disease, or unspecified chronic kidney disease: Secondary | ICD-10-CM | POA: Diagnosis not present

## 2014-02-04 DIAGNOSIS — G309 Alzheimer's disease, unspecified: Secondary | ICD-10-CM | POA: Diagnosis not present

## 2014-02-04 DIAGNOSIS — F0280 Dementia in other diseases classified elsewhere without behavioral disturbance: Secondary | ICD-10-CM | POA: Diagnosis not present

## 2014-02-04 DIAGNOSIS — M6281 Muscle weakness (generalized): Secondary | ICD-10-CM | POA: Diagnosis not present

## 2014-02-06 ENCOUNTER — Encounter (HOSPITAL_COMMUNITY): Payer: Self-pay | Admitting: Emergency Medicine

## 2014-02-06 ENCOUNTER — Emergency Department (HOSPITAL_COMMUNITY): Payer: Medicare Other

## 2014-02-06 ENCOUNTER — Emergency Department (HOSPITAL_COMMUNITY)
Admission: EM | Admit: 2014-02-06 | Discharge: 2014-02-06 | Disposition: A | Discharge status: Home / Self Care | Payer: Medicare Other | Attending: Emergency Medicine | Admitting: Emergency Medicine

## 2014-02-06 DIAGNOSIS — Z79899 Other long term (current) drug therapy: Secondary | ICD-10-CM | POA: Diagnosis not present

## 2014-02-06 DIAGNOSIS — R296 Repeated falls: Secondary | ICD-10-CM | POA: Diagnosis not present

## 2014-02-06 DIAGNOSIS — M5136 Other intervertebral disc degeneration, lumbar region: Secondary | ICD-10-CM | POA: Diagnosis not present

## 2014-02-06 DIAGNOSIS — Y9289 Other specified places as the place of occurrence of the external cause: Secondary | ICD-10-CM | POA: Insufficient documentation

## 2014-02-06 DIAGNOSIS — Z853 Personal history of malignant neoplasm of breast: Secondary | ICD-10-CM | POA: Insufficient documentation

## 2014-02-06 DIAGNOSIS — F329 Major depressive disorder, single episode, unspecified: Secondary | ICD-10-CM | POA: Insufficient documentation

## 2014-02-06 DIAGNOSIS — Z8719 Personal history of other diseases of the digestive system: Secondary | ICD-10-CM | POA: Insufficient documentation

## 2014-02-06 DIAGNOSIS — Z8742 Personal history of other diseases of the female genital tract: Secondary | ICD-10-CM | POA: Diagnosis not present

## 2014-02-06 DIAGNOSIS — Z8639 Personal history of other endocrine, nutritional and metabolic disease: Secondary | ICD-10-CM | POA: Insufficient documentation

## 2014-02-06 DIAGNOSIS — M1990 Unspecified osteoarthritis, unspecified site: Secondary | ICD-10-CM | POA: Insufficient documentation

## 2014-02-06 DIAGNOSIS — R531 Weakness: Secondary | ICD-10-CM | POA: Diagnosis not present

## 2014-02-06 DIAGNOSIS — Z862 Personal history of diseases of the blood and blood-forming organs and certain disorders involving the immune mechanism: Secondary | ICD-10-CM | POA: Insufficient documentation

## 2014-02-06 DIAGNOSIS — Z7982 Long term (current) use of aspirin: Secondary | ICD-10-CM | POA: Diagnosis not present

## 2014-02-06 DIAGNOSIS — I129 Hypertensive chronic kidney disease with stage 1 through stage 4 chronic kidney disease, or unspecified chronic kidney disease: Secondary | ICD-10-CM | POA: Diagnosis not present

## 2014-02-06 DIAGNOSIS — M549 Dorsalgia, unspecified: Secondary | ICD-10-CM | POA: Diagnosis not present

## 2014-02-06 DIAGNOSIS — R030 Elevated blood-pressure reading, without diagnosis of hypertension: Secondary | ICD-10-CM | POA: Diagnosis not present

## 2014-02-06 DIAGNOSIS — F0280 Dementia in other diseases classified elsewhere without behavioral disturbance: Secondary | ICD-10-CM | POA: Insufficient documentation

## 2014-02-06 DIAGNOSIS — H409 Unspecified glaucoma: Secondary | ICD-10-CM | POA: Diagnosis not present

## 2014-02-06 DIAGNOSIS — S3992XA Unspecified injury of lower back, initial encounter: Secondary | ICD-10-CM | POA: Diagnosis not present

## 2014-02-06 DIAGNOSIS — Z872 Personal history of diseases of the skin and subcutaneous tissue: Secondary | ICD-10-CM | POA: Diagnosis not present

## 2014-02-06 DIAGNOSIS — Y9389 Activity, other specified: Secondary | ICD-10-CM | POA: Diagnosis not present

## 2014-02-06 DIAGNOSIS — N183 Chronic kidney disease, stage 3 (moderate): Secondary | ICD-10-CM | POA: Diagnosis not present

## 2014-02-06 DIAGNOSIS — Y998 Other external cause status: Secondary | ICD-10-CM | POA: Diagnosis not present

## 2014-02-06 DIAGNOSIS — I10 Essential (primary) hypertension: Secondary | ICD-10-CM | POA: Diagnosis not present

## 2014-02-06 DIAGNOSIS — M545 Low back pain: Secondary | ICD-10-CM | POA: Diagnosis not present

## 2014-02-06 DIAGNOSIS — T1490XA Injury, unspecified, initial encounter: Secondary | ICD-10-CM

## 2014-02-06 DIAGNOSIS — W06XXXA Fall from bed, initial encounter: Secondary | ICD-10-CM | POA: Diagnosis not present

## 2014-02-06 DIAGNOSIS — R05 Cough: Secondary | ICD-10-CM | POA: Diagnosis not present

## 2014-02-06 DIAGNOSIS — R259 Unspecified abnormal involuntary movements: Secondary | ICD-10-CM | POA: Diagnosis not present

## 2014-02-06 LAB — URINALYSIS, ROUTINE W REFLEX MICROSCOPIC
Bilirubin Urine: NEGATIVE
Glucose, UA: NEGATIVE mg/dL
Hgb urine dipstick: NEGATIVE
KETONES UR: NEGATIVE mg/dL
Leukocytes, UA: NEGATIVE
NITRITE: NEGATIVE
PH: 7 (ref 5.0–8.0)
PROTEIN: NEGATIVE mg/dL
Specific Gravity, Urine: 1.018 (ref 1.005–1.030)
Urobilinogen, UA: 1 mg/dL (ref 0.0–1.0)

## 2014-02-06 LAB — COMPREHENSIVE METABOLIC PANEL
ALT: 15 U/L (ref 0–35)
AST: 20 U/L (ref 0–37)
Albumin: 3.7 g/dL (ref 3.5–5.2)
Alkaline Phosphatase: 62 U/L (ref 39–117)
Anion gap: 8 (ref 5–15)
BILIRUBIN TOTAL: 1.1 mg/dL (ref 0.3–1.2)
BUN: 17 mg/dL (ref 6–23)
CO2: 26 mmol/L (ref 19–32)
Calcium: 9.5 mg/dL (ref 8.4–10.5)
Chloride: 103 mEq/L (ref 96–112)
Creatinine, Ser: 1.1 mg/dL (ref 0.50–1.10)
GFR calc Af Amer: 49 mL/min — ABNORMAL LOW (ref >90)
GFR calc non Af Amer: 43 mL/min — ABNORMAL LOW (ref >90)
Glucose, Bld: 114 mg/dL — ABNORMAL HIGH (ref 70–99)
POTASSIUM: 4.3 mmol/L (ref 3.5–5.1)
SODIUM: 137 mmol/L (ref 135–145)
Total Protein: 7.7 g/dL (ref 6.0–8.3)

## 2014-02-06 LAB — CBC WITH DIFFERENTIAL/PLATELET
BASOS PCT: 0 % (ref 0–1)
Basophils Absolute: 0 10*3/uL (ref 0.0–0.1)
Eosinophils Absolute: 0.1 10*3/uL (ref 0.0–0.7)
Eosinophils Relative: 2 % (ref 0–5)
HCT: 37.5 % (ref 36.0–46.0)
Hemoglobin: 11.7 g/dL — ABNORMAL LOW (ref 12.0–15.0)
LYMPHS ABS: 1.3 10*3/uL (ref 0.7–4.0)
LYMPHS PCT: 24 % (ref 12–46)
MCH: 29.9 pg (ref 26.0–34.0)
MCHC: 31.2 g/dL (ref 30.0–36.0)
MCV: 95.9 fL (ref 78.0–100.0)
MONO ABS: 0.6 10*3/uL (ref 0.1–1.0)
Monocytes Relative: 12 % (ref 3–12)
Neutro Abs: 3.4 10*3/uL (ref 1.7–7.7)
Neutrophils Relative %: 62 % (ref 43–77)
Platelets: 236 10*3/uL (ref 150–400)
RBC: 3.91 MIL/uL (ref 3.87–5.11)
RDW: 13.2 % (ref 11.5–15.5)
WBC: 5.4 10*3/uL (ref 4.0–10.5)

## 2014-02-06 LAB — TROPONIN I: Troponin I: <0.03 ng/mL (ref <0.031)

## 2014-02-06 LAB — CK: CK TOTAL: 74 U/L (ref 7–177)

## 2014-02-06 NOTE — Progress Notes (Signed)
  CARE MANAGEMENT ED NOTE 02/06/2014  Patient:  Michelle [name], Michelle [name]   Account Number:  [id number]  Date Initiated:  02/06/2014  Documentation initiated by:  Livia Snellen  Subjective/Objective Assessment:   Patient presents to Ed with back pain post fall     Subjective/Objective Assessment Detail:   Patient is a resident at [facility]     Action/Plan:   Patient to be discharged home with home health services.   Action/Plan Detail:   Anticipated DC Date:  02/06/2014     Status Recommendation to Physician:   Result of Recommendation:    Other ED Services  Consult Working [place]  Other  CM consult   [facility] Choice  HOME HEALTH   Choice offered to / List presented to:  C-4 Adult Children     HH arranged  HH-1 RN  [place] agency  [place]    Status of service:  Completed, signed off  ED Comments:   ED Comments Detail:  EDCM spoke to patient's daughter Michelle [name] at bedside. Patient's daughter reports patient is currently receiivng private duty nursing services 4 hours in the am and 3 hours in the pm.  Patient's daughter reports patient has a walker, bedside commode, wheelchair and rolator at home. Patient for discharge home.  EDCM called patient's duaghter Michelle [name] at phone number [phone] at 2020pm to discuss home health services.  Patient's daughter reports patien tis currently active with PT with [facility]. Patient's daughter agreeable to increase homehealth services with [facility]. Patient's daughter reports someone will be staying with the patient this evening and "We have the weekend covered." EDCM inofrmed patient's daughter that home health social worker can assist with placement from home.  EDCM also provided patient's daughter with phone number to Certified Senior Advisor, Laurey Morale [phone] to assist with placement as well.  Patient's daughter reports she  has spoken to EDP.  [facility] discussed patient with EDP.  Home health orders for RN, PT, OT, aide and social worker placed and faxed out to [facility].  Patient's daughter thankful for services.  EDCM explained all of the above to the patient as well.  Patient agreeable and thankful ofr services.  No further EDCM needs at this time.

## 2014-02-06 NOTE — ED Provider Notes (Signed)
CSN: [id number]     Arrival date & time 02/06/14  1631 History   First MD Initiated Contact with Patient 02/06/14 1705     Chief Complaint  Patient presents with  . Back Pain  . Fall     (Consider location/radiation/quality/duration/timing/severity/associated sxs/prior Treatment) HPI Comments: Patient here complaining of diffuse weakness worse lower extremities and back. Patient fell from her bed 2 days ago. No loss of consciousness. Does use a walker to ambulate normally. No bowel or bladder dysfunction. No radiation down to her legs. Denies any saddle anesthesia. No recent vomiting, fever, chills. Denies any anorexia. Weakness has been persistent and worse with movement and better with rest. Denies any chest pain or shortness of breath. Denies abdominal pain. No syncope or near syncope. No treatment use prior to arrival.  Patient is a 81 y.o. female presenting with back pain and fall. The history is provided by the patient and a relative.  Back Pain Fall    Past Medical History  Diagnosis Date  . Hypertension   . Hyperlipidemia LDL goal < 100   . Polymyalgia rheumatica   . GERD (gastroesophageal reflux disease)   . Irritable bowel syndrome   . Diverticulitis   . Sciatica   . Vertigo   . Edema of leg   . Glaucoma   . Appendicitis   . Arthritis   . Acute upper respiratory infections of unspecified site   . Urge incontinence   . Other specified symptom associated with female genital organs   . Regional enteritis of unspecified site   . Sebaceous cyst   . Depression   . Other malaise and fatigue   . Abnormal weight gain   . Leukocytopenia, unspecified   . Dementia in conditions classified elsewhere without behavioral disturbance   . Anemia, unspecified   . Chronic kidney disease, stage III (moderate)   . Unspecified arthropathy, shoulder region   . Other B-complex deficiencies   . Unspecified vitamin D deficiency   . Memory loss   . Abnormality of gait   . Personal  history of fall   . Malignant neoplasm of breast (female), unspecified site   . Unspecified glaucoma   . Diverticulosis of colon (without mention of hemorrhage)   . Irritable bowel syndrome   . Sciatica   . Dizziness and giddiness    Past Surgical History  Procedure Laterality Date  . Nasal sinus surgery  1966  . Cholecystectomy  1972  . Cataract extraction  1990  . Breast surgery      R breast lumpectomy for benign disease  . Breast lumpectomy  10/28/10    left breast    Family History  Problem Relation Age of Onset  . Stroke Father   . Hypertension Mother   . Stroke Mother   . Breast cancer Mother   . Heart attack Brother   . Cancer Brother   . Stroke Brother   . Heart attack Son   . Colon cancer Sister    History  Substance Use Topics  . Smoking status: Never Smoker   . Smokeless tobacco: Never Used  . Alcohol Use: No   OB History    No data available     Review of Systems  Musculoskeletal: Positive for back pain.  All other systems reviewed and are negative.     Allergies  Codeine  Home Medications   Prior to Admission medications   Medication Sig Start Date End Date Taking? Authorizing Provider  amLODipine (NORVASC) 5  MG tablet TAKE 1 TABLET DAILY. 09/09/13   Tiffany L Reed, DO  aspirin 81 MG chewable tablet Chew 81 mg by mouth daily.     Historical Provider, MD  bimatoprost (LUMIGAN) 0.01 % SOLN Place 1 drop into both eyes at bedtime.     Historical Provider, MD  brimonidine-timolol (COMBIGAN) 0.2-0.5 % ophthalmic solution Place 1 drop into both eyes 2 (two) times daily.     Historical Provider, MD  calcium carbonate 200 MG capsule Take 200 mg by mouth daily.     Historical Provider, MD  citalopram (CELEXA) 10 MG tablet TAKE 1 TABLET ONCE DAILY. 10/17/13   Tiffany L Reed, DO  dorzolamide (TRUSOPT) 2 % ophthalmic solution Place 1 drop into both eyes 2 (two) times daily.      Historical Provider, MD  lidocaine (LIDODERM) 5 % Apply one patch to each  shoulder, keep on 12 hours, then keep off for 12 hours for pain 12/03/13   Estill Dooms, MD  Multiple Vitamin (MULTIVITAMIN WITH MINERALS) TABS Take 1 tablet by mouth daily.    Historical Provider, MD  traMADol (ULTRAM) 50 MG tablet TAKE 1 TABLET 3 TIMES DAILY AS NEEDED FOR PAIN. 01/13/14   Tiffany L Reed, DO   BP 193/84 mmHg  Pulse 58  Temp(Src) 99.6 F (37.6 C) (Oral)  Resp 16  SpO2 97% Physical Exam  Constitutional: She is oriented to person, place, and time. She appears well-developed and well-nourished.  Non-toxic appearance. No distress.  HENT:  Head: Normocephalic and atraumatic.  Eyes: Conjunctivae, EOM and lids are normal. Pupils are equal, round, and reactive to light.  Neck: Normal range of motion. Neck supple. No tracheal deviation present. No thyroid mass present.  Cardiovascular: Normal rate, regular rhythm and normal heart sounds.  Exam reveals no gallop.   No murmur heard. Pulmonary/Chest: Effort normal and breath sounds normal. No stridor. No respiratory distress. She has no decreased breath sounds. She has no wheezes. She has no rhonchi. She has no rales.  Abdominal: Soft. Normal appearance and bowel sounds are normal. She exhibits no distension. There is no tenderness. There is no rebound and no CVA tenderness.  Musculoskeletal: Normal range of motion. She exhibits no edema or tenderness.       Back:  Neurological: She is alert and oriented to person, place, and time. She has normal strength. No cranial nerve deficit or sensory deficit. GCS eye subscore is 4. GCS verbal subscore is 5. GCS motor subscore is 6.  Reflex Scores:      Patellar reflexes are 3+ on the right side and 3+ on the left side. Skin: Skin is warm and dry. No abrasion and no rash noted.  Psychiatric: Her speech is normal and behavior is normal. Her affect is blunt.  Nursing note and vitals reviewed.   ED Course  Procedures (including critical care time) Labs Review Labs Reviewed  URINE CULTURE    CBC WITH DIFFERENTIAL  COMPREHENSIVE METABOLIC PANEL  URINALYSIS, ROUTINE W REFLEX MICROSCOPIC  CK  TROPONIN I    Imaging Review No results found.   EKG Interpretation   Date/Time:  Friday February 06 2014 17:26:38 EST Ventricular Rate:  68 PR Interval:  169 QRS Duration: 88 QT Interval:  445 QTC Calculation: 473 R Axis:   -5 Text Interpretation:  Sinus rhythm RSR' in V1 or V2, right VCD or RVH Left  ventricular hypertrophy No significant change since last tracing Confirmed  by Massa Pe  MD, Susana Duell ([id number]) on 02/06/2014 5:34:43 PM  MDM   Final diagnoses:  Trauma     Patient's workup here without acute findings. Patient has been seen by care management and increased home health is been arranged. Discussed with daughter and she is comfortable with this plan.   Leota Jacobsen, MD 02/06/14 [phone]

## 2014-02-06 NOTE — Progress Notes (Signed)
  CARE MANAGEMENT ED NOTE 02/06/2014  Patient:  Michelle [name], Michelle [name]   Account Number:  [id number]  Date Initiated:  02/06/2014  Documentation initiated by:  Jackelyn Poling  Subjective/Objective Assessment:   81 yr old medicare/aetna managed pt residing at [facility] rolling out of bed, although she has guard rails-daughter states she has falling several times in the past couple of weeks-EMS has evaluated but patient     Subjective/Objective Assessment Detail:   refused treatment-here today because now she has back pain-did not hit head, no LOC    Pcp Michelle [name]    Confirms pt has evening PDN services from [facility]  but not morning services Family unable to afford am PDN services per dtr     Action/Plan:   CM consulted by EDP, [name] to speak with pt & daughter about possible community resources EDP expressed not sure if pt will meet inpatient admission criteria   Action/Plan Detail:   ED SW [place] updated   Anticipated DC Date:       Status Recommendation to Physician:   Result of Recommendation:    Other ED Services  Consult Working Plan   In-house referral  Clinical Social Worker   DC Forensic scientist  Other  Outpatient Services - Pt will follow up    Choice offered to / List presented to:            Status of service:  Completed, signed off  ED Comments:   ED Comments Detail:  02/06/13 1730 CM reviewed in details medicare guidelines, home health ([facility]) (length of stay in home, types of [facility] staff available, coverage, primary caregiver, up to 24 hrs before services may be started), Private duty nursing (PDN-coverage, length of stay in the home types of staff available), assisted living (ASL- coverage, services offered) and Skilled nursing facilities (snf- coverage and services offered) Discussed with [name] that at this time of the day there are not alf nor snf administration staff to assist with placement in facility  CM  reviewed availability of HH SW to assist pcp to get pt to snf (if desired disposition) from the community level.  Offered list of home health agencies and PDN agencies Given to pt Daughter  Inquired if pt had been hospitalized in last 30 days Dtr & PDN staff stated no, only ED visits Explained a previous hospitalization in 30 days as pertained to qualifying stay per medicare guidelines  Discussed possible self pay cost for placement in lower level of care settings.  Discussed with dtr that pt may benefit from lower level of care vs higher level. [name] voiced understanding  [name] leaves her number as [phone] Reports has another family member she needs to see in about "an hour"  Daughter discussed with Cm that the pt sbp was 180s Daughter at bedside with one of the PDN staff for evenings

## 2014-02-06 NOTE — ED Notes (Signed)
Bed: [facility]ALD Expected date:  Expected time:  Means of arrival:  Comments: EMS back pain 

## 2014-02-06 NOTE — Progress Notes (Signed)
CSW met with patient at bedside. There was no family present. Patient informed CSW that she fell out of bed 2 days ago. Patient says that she currently has back pain and that she often feels weak. Patient states that she lives in a retirement community and that her daughter often checks on her. According to patient, she was brought into the ED by daughter.   Per [employer], the address of the patient is [address] [place], [place].  Willette Brace [phone] ED CSW 02/06/2014 5:31 PM

## 2014-02-06 NOTE — Discharge Instructions (Signed)

## 2014-02-06 NOTE — ED Notes (Addendum)
Per EMS-states rolling out of bed, although she has guard rails-daughter states she has falling several times in the past couple of weeks-EMS has evaluated but patient refused treatment-here today because now she has back pain-did not hit head, no LOC

## 2014-02-07 DIAGNOSIS — M6281 Muscle weakness (generalized): Secondary | ICD-10-CM | POA: Diagnosis not present

## 2014-02-07 DIAGNOSIS — F329 Major depressive disorder, single episode, unspecified: Secondary | ICD-10-CM | POA: Diagnosis not present

## 2014-02-07 DIAGNOSIS — G309 Alzheimer's disease, unspecified: Secondary | ICD-10-CM | POA: Diagnosis not present

## 2014-02-07 DIAGNOSIS — N189 Chronic kidney disease, unspecified: Secondary | ICD-10-CM | POA: Diagnosis not present

## 2014-02-07 DIAGNOSIS — F0280 Dementia in other diseases classified elsewhere without behavioral disturbance: Secondary | ICD-10-CM | POA: Diagnosis not present

## 2014-02-07 DIAGNOSIS — I129 Hypertensive chronic kidney disease with stage 1 through stage 4 chronic kidney disease, or unspecified chronic kidney disease: Secondary | ICD-10-CM | POA: Diagnosis not present

## 2014-02-07 LAB — URINE CULTURE
Colony Count: NO GROWTH
Culture: NO GROWTH

## 2014-02-09 ENCOUNTER — Telehealth: Payer: Self-pay

## 2014-02-09 DIAGNOSIS — I129 Hypertensive chronic kidney disease with stage 1 through stage 4 chronic kidney disease, or unspecified chronic kidney disease: Secondary | ICD-10-CM | POA: Diagnosis not present

## 2014-02-09 DIAGNOSIS — G309 Alzheimer's disease, unspecified: Secondary | ICD-10-CM | POA: Diagnosis not present

## 2014-02-09 DIAGNOSIS — N189 Chronic kidney disease, unspecified: Secondary | ICD-10-CM | POA: Diagnosis not present

## 2014-02-09 DIAGNOSIS — F329 Major depressive disorder, single episode, unspecified: Secondary | ICD-10-CM | POA: Diagnosis not present

## 2014-02-09 DIAGNOSIS — F0280 Dementia in other diseases classified elsewhere without behavioral disturbance: Secondary | ICD-10-CM | POA: Diagnosis not present

## 2014-02-09 DIAGNOSIS — M6281 Muscle weakness (generalized): Secondary | ICD-10-CM | POA: Diagnosis not present

## 2014-02-09 NOTE — Telephone Encounter (Signed)
This is ok temporarily.  Please ask her caregivers to monitor her blood pressures.  I noted that they were high in the ED and the DM part of her cough syrup may worsen her bp

## 2014-02-09 NOTE — Telephone Encounter (Signed)
Patient with productive cough, no discoloration. Patient would like to know if its ok for her to take robitussin DM. I added med to medication list to check for contraindications, no warnings populated. I will send message to provider as well to confirm ok to take.  Patient to schedule appointment if no improvement or if symptoms persist.

## 2014-02-10 ENCOUNTER — Emergency Department (HOSPITAL_COMMUNITY)
Admission: EM | Admit: 2014-02-10 | Discharge: 2014-02-11 | Disposition: A | Discharge status: Home / Self Care | Payer: Medicare Other | Attending: Emergency Medicine | Admitting: Emergency Medicine

## 2014-02-10 ENCOUNTER — Emergency Department (HOSPITAL_COMMUNITY): Payer: Medicare Other

## 2014-02-10 ENCOUNTER — Encounter (HOSPITAL_COMMUNITY): Payer: Self-pay | Admitting: Emergency Medicine

## 2014-02-10 DIAGNOSIS — Z8742 Personal history of other diseases of the female genital tract: Secondary | ICD-10-CM | POA: Insufficient documentation

## 2014-02-10 DIAGNOSIS — Z79899 Other long term (current) drug therapy: Secondary | ICD-10-CM | POA: Insufficient documentation

## 2014-02-10 DIAGNOSIS — H409 Unspecified glaucoma: Secondary | ICD-10-CM | POA: Insufficient documentation

## 2014-02-10 DIAGNOSIS — R197 Diarrhea, unspecified: Secondary | ICD-10-CM | POA: Insufficient documentation

## 2014-02-10 DIAGNOSIS — Z853 Personal history of malignant neoplasm of breast: Secondary | ICD-10-CM | POA: Diagnosis not present

## 2014-02-10 DIAGNOSIS — R404 Transient alteration of awareness: Secondary | ICD-10-CM | POA: Diagnosis not present

## 2014-02-10 DIAGNOSIS — M1990 Unspecified osteoarthritis, unspecified site: Secondary | ICD-10-CM | POA: Insufficient documentation

## 2014-02-10 DIAGNOSIS — N183 Chronic kidney disease, stage 3 (moderate): Secondary | ICD-10-CM | POA: Insufficient documentation

## 2014-02-10 DIAGNOSIS — R05 Cough: Secondary | ICD-10-CM | POA: Diagnosis not present

## 2014-02-10 DIAGNOSIS — R531 Weakness: Secondary | ICD-10-CM | POA: Insufficient documentation

## 2014-02-10 DIAGNOSIS — Z8639 Personal history of other endocrine, nutritional and metabolic disease: Secondary | ICD-10-CM | POA: Diagnosis not present

## 2014-02-10 DIAGNOSIS — Z9181 History of falling: Secondary | ICD-10-CM | POA: Diagnosis not present

## 2014-02-10 DIAGNOSIS — I129 Hypertensive chronic kidney disease with stage 1 through stage 4 chronic kidney disease, or unspecified chronic kidney disease: Secondary | ICD-10-CM | POA: Insufficient documentation

## 2014-02-10 DIAGNOSIS — R42 Dizziness and giddiness: Secondary | ICD-10-CM | POA: Diagnosis not present

## 2014-02-10 DIAGNOSIS — K219 Gastro-esophageal reflux disease without esophagitis: Secondary | ICD-10-CM | POA: Diagnosis not present

## 2014-02-10 DIAGNOSIS — F329 Major depressive disorder, single episode, unspecified: Secondary | ICD-10-CM | POA: Diagnosis not present

## 2014-02-10 DIAGNOSIS — Z862 Personal history of diseases of the blood and blood-forming organs and certain disorders involving the immune mechanism: Secondary | ICD-10-CM | POA: Diagnosis not present

## 2014-02-10 DIAGNOSIS — Z8709 Personal history of other diseases of the respiratory system: Secondary | ICD-10-CM | POA: Insufficient documentation

## 2014-02-10 DIAGNOSIS — Z872 Personal history of diseases of the skin and subcutaneous tissue: Secondary | ICD-10-CM | POA: Insufficient documentation

## 2014-02-10 DIAGNOSIS — Z7982 Long term (current) use of aspirin: Secondary | ICD-10-CM | POA: Insufficient documentation

## 2014-02-10 LAB — CBC WITH DIFFERENTIAL/PLATELET
Basophils Absolute: 0 10*3/uL (ref 0.0–0.1)
Basophils Relative: 1 % (ref 0–1)
Eosinophils Absolute: 0.1 10*3/uL (ref 0.0–0.7)
Eosinophils Relative: 3 % (ref 0–5)
HCT: 36.6 % (ref 36.0–46.0)
Hemoglobin: 11.4 g/dL — ABNORMAL LOW (ref 12.0–15.0)
LYMPHS ABS: 0.8 10*3/uL (ref 0.7–4.0)
LYMPHS PCT: 21 % (ref 12–46)
MCH: 30.2 pg (ref 26.0–34.0)
MCHC: 31.1 g/dL (ref 30.0–36.0)
MCV: 96.8 fL (ref 78.0–100.0)
MONOS PCT: 13 % — AB (ref 3–12)
Monocytes Absolute: 0.5 10*3/uL (ref 0.1–1.0)
NEUTROS PCT: 62 % (ref 43–77)
Neutro Abs: 2.4 10*3/uL (ref 1.7–7.7)
Platelets: 236 10*3/uL (ref 150–400)
RBC: 3.78 MIL/uL — ABNORMAL LOW (ref 3.87–5.11)
RDW: 13.4 % (ref 11.5–15.5)
WBC: 3.9 10*3/uL — ABNORMAL LOW (ref 4.0–10.5)

## 2014-02-10 LAB — URINALYSIS, ROUTINE W REFLEX MICROSCOPIC
BILIRUBIN URINE: NEGATIVE
Glucose, UA: NEGATIVE mg/dL
Hgb urine dipstick: NEGATIVE
KETONES UR: NEGATIVE mg/dL
Leukocytes, UA: NEGATIVE
Nitrite: NEGATIVE
Protein, ur: NEGATIVE mg/dL
Specific Gravity, Urine: 1.025 (ref 1.005–1.030)
UROBILINOGEN UA: 0.2 mg/dL (ref 0.0–1.0)
pH: 5 (ref 5.0–8.0)

## 2014-02-10 LAB — BASIC METABOLIC PANEL
Anion gap: 5 (ref 5–15)
BUN: 27 mg/dL — AB (ref 6–23)
CALCIUM: 8.7 mg/dL (ref 8.4–10.5)
CO2: 24 mmol/L (ref 19–32)
CREATININE: 1.5 mg/dL — AB (ref 0.50–1.10)
Chloride: 107 mEq/L (ref 96–112)
GFR, EST AFRICAN AMERICAN: 34 mL/min — AB (ref >90)
GFR, EST NON AFRICAN AMERICAN: 29 mL/min — AB (ref >90)
Glucose, Bld: 113 mg/dL — ABNORMAL HIGH (ref 70–99)
POTASSIUM: 4.4 mmol/L (ref 3.5–5.1)
Sodium: 136 mmol/L (ref 135–145)

## 2014-02-10 LAB — TROPONIN I: Troponin I: <0.03 ng/mL (ref <0.031)

## 2014-02-10 NOTE — Discharge Instructions (Signed)

## 2014-02-10 NOTE — ED Provider Notes (Addendum)
CSN: [id number]     Arrival date & time 02/10/14  1819 History   First MD Initiated Contact with Patient 02/10/14 1830     Chief Complaint  Patient presents with  . Weakness  . Diarrhea  . Cough     (Consider location/radiation/quality/duration/timing/severity/associated sxs/prior Treatment) HPI Comments: 81 year old female who presents with generalized weakness as well as a cough. Patient herself states that her cough is improving and denies other associated symptoms such as chest pain, shortness of breath, fevers, or urinary symptoms. Her caregiver reports that she has had increasing difficulty ambulating with her walker and transferring. She has had to use her wheelchair recently.  Patient's daughter reports that she was here about 5 days ago after a fall. Workup at that time was unremarkable and she was discharged home with increased home care. She reports that she sees a physical therapist at the house several times a week. She has 24/7 caregivers.  Patient is a 81 y.o. female presenting with weakness.  Weakness This is a new problem. Episode onset: several weeks ago. The problem occurs constantly. The problem has been gradually worsening (significantly worse today). Pertinent negatives include no chest pain, no abdominal pain, no headaches and no shortness of breath. Associated symptoms comments: Cough.  Several falls in past few weeks.. Nothing aggravates the symptoms. Nothing relieves the symptoms. She has tried nothing for the symptoms.    Past Medical History  Diagnosis Date  . Hypertension   . Hyperlipidemia LDL goal < 100   . Polymyalgia rheumatica   . GERD (gastroesophageal reflux disease)   . Irritable bowel syndrome   . Diverticulitis   . Sciatica   . Vertigo   . Edema of leg   . Glaucoma   . Appendicitis   . Arthritis   . Acute upper respiratory infections of unspecified site   . Urge incontinence   . Other specified symptom associated with female genital organs    . Regional enteritis of unspecified site   . Sebaceous cyst   . Depression   . Other malaise and fatigue   . Abnormal weight gain   . Leukocytopenia, unspecified   . Dementia in conditions classified elsewhere without behavioral disturbance   . Anemia, unspecified   . Chronic kidney disease, stage III (moderate)   . Unspecified arthropathy, shoulder region   . Other B-complex deficiencies   . Unspecified vitamin D deficiency   . Memory loss   . Abnormality of gait   . Personal history of fall   . Malignant neoplasm of breast (female), unspecified site   . Unspecified glaucoma   . Diverticulosis of colon (without mention of hemorrhage)   . Irritable bowel syndrome   . Sciatica   . Dizziness and giddiness    Past Surgical History  Procedure Laterality Date  . Nasal sinus surgery  1966  . Cholecystectomy  1972  . Cataract extraction  1990  . Breast surgery      R breast lumpectomy for benign disease  . Breast lumpectomy  10/28/10    left breast    Family History  Problem Relation Age of Onset  . Stroke Father   . Hypertension Mother   . Stroke Mother   . Breast cancer Mother   . Heart attack Brother   . Cancer Brother   . Stroke Brother   . Heart attack Son   . Colon cancer Sister    History  Substance Use Topics  . Smoking status: Never Smoker   .  Smokeless tobacco: Never Used  . Alcohol Use: No   OB History    No data available     Review of Systems  Respiratory: Negative for shortness of breath.   Cardiovascular: Negative for chest pain.  Gastrointestinal: Negative for abdominal pain.  Neurological: Positive for weakness. Negative for headaches.  All other systems reviewed and are negative.     Allergies  Codeine  Home Medications   Prior to Admission medications   Medication Sig Start Date End Date Taking? Authorizing Provider  acetaminophen (TYLENOL) 500 MG tablet Take 500 mg by mouth every 6 (six) hours as needed for mild pain or headache.     Historical Provider, MD  amLODipine (NORVASC) 5 MG tablet TAKE 1 TABLET DAILY. 09/09/13   Tiffany L Reed, DO  aspirin 81 MG chewable tablet Chew 81 mg by mouth daily.     Historical Provider, MD  bimatoprost (LUMIGAN) 0.01 % SOLN Place 1 drop into both eyes at bedtime.     Historical Provider, MD  brimonidine-timolol (COMBIGAN) 0.2-0.5 % ophthalmic solution Place 1 drop into both eyes 2 (two) times daily.     Historical Provider, MD  calcium carbonate 200 MG capsule Take 200 mg by mouth daily.     Historical Provider, MD  citalopram (CELEXA) 10 MG tablet TAKE 1 TABLET ONCE DAILY. 10/17/13   Tiffany L Reed, DO  dorzolamide (TRUSOPT) 2 % ophthalmic solution Place 1 drop into both eyes 2 (two) times daily.      Historical Provider, MD  lidocaine (LIDODERM) 5 % Apply one patch to each shoulder, keep on 12 hours, then keep off for 12 hours for pain 12/03/13   Estill Dooms, MD  Multiple Vitamin (MULTIVITAMIN WITH MINERALS) TABS Take 1 tablet by mouth daily.    Historical Provider, MD  traMADol (ULTRAM) 50 MG tablet TAKE 1 TABLET 3 TIMES DAILY AS NEEDED FOR PAIN. 01/13/14   Tiffany L Reed, DO   BP 170/58 mmHg  Pulse 64  Temp(Src) 98.4 F (36.9 C) (Oral)  Resp 16  Ht 5\' 4"  (1.626 m)  Wt 190 lb (86.183 kg)  BMI 32.60 kg/m2  SpO2 94% Physical Exam  Constitutional: She is oriented to person, place, and time. She appears well-developed and well-nourished. No distress.  elderly  HENT:  Head: Normocephalic and atraumatic.  Mouth/Throat: Oropharynx is clear and moist.  Eyes: Conjunctivae are normal. Pupils are equal, round, and reactive to light. No scleral icterus.  Neck: Neck supple.  Cardiovascular: Normal rate, regular rhythm, normal heart sounds and intact distal pulses.   No murmur heard. Pulmonary/Chest: Effort normal and breath sounds normal. No stridor. No respiratory distress. She has no rales.  Abdominal: Soft. Bowel sounds are normal. She exhibits no distension. There is no tenderness.   Musculoskeletal: Normal range of motion.  Neurological: She is alert and oriented to person, place, and time.  Bilateral dorsi and plantar flexion strength normal  Skin: Skin is warm and dry. No rash noted.  Psychiatric: She has a normal mood and affect. Her behavior is normal.  Nursing note and vitals reviewed.   ED Course  Procedures (including critical care time) Labs Review Labs Reviewed  BASIC METABOLIC PANEL - Abnormal; Notable for the following:    Glucose, Bld 113 (*)    BUN 27 (*)    Creatinine, Ser 1.50 (*)    GFR calc non Af Amer 29 (*)    GFR calc Af Amer 34 (*)    All other components within  normal limits  CBC WITH DIFFERENTIAL - Abnormal; Notable for the following:    WBC 3.9 (*)    RBC 3.78 (*)    Hemoglobin 11.4 (*)    Monocytes Relative 13 (*)    All other components within normal limits  URINALYSIS, ROUTINE W REFLEX MICROSCOPIC - Abnormal; Notable for the following:    APPearance CLOUDY (*)    All other components within normal limits  URINE CULTURE  TROPONIN I    Imaging Review Dg Chest 2 View  02/10/2014   CLINICAL DATA:  Pt from nursing home, hx dementia, native [place] speaker. Pt's aide reports pt has had intermittent dizzy spells today. Has had cough/cold for "a few days" that has progressively gotten worse. RN reports diarrhea yesterday. Family states pt had a cxr x 3 days ago.  EXAM: CHEST  2 VIEW  COMPARISON:  02/06/2014.  FINDINGS: Cardiac silhouette is mildly enlarged. No mediastinal or hilar masses or convincing adenopathy. Clear lungs. No pleural effusion or pneumothorax.  Bony thorax is demineralized. Stable arthropathic changes are noted of both shoulders.  IMPRESSION: No acute cardiopulmonary disease.   Electronically Signed   By: Lajean Manes M.D.   On: 02/10/2014 19:33  All radiology studies independently viewed by me.      EKG Interpretation   Date/Time:  Tuesday February 10 2014 18:35:58 EST Ventricular Rate:  63 PR Interval:  176 QRS  Duration: 84 QT Interval:  431 QTC Calculation: 441 R Axis:   3 Text Interpretation:  Sinus rhythm Nonspecific T abnrm, anterolateral  leads No significant change was found Confirmed by [facility]  MD, TREY  ([id number]) on 02/10/2014 10:12:51 PM      MDM   Final diagnoses:  Generalized weakness    81 year old female with generalized weakness which has progressed over the past several weeks and particularly today.  Other than a cough, patient has no other localizable symptoms. She does not have focal weakness. Her ED workup was reassuring. Her chest x-ray was negative. She had a difficult time ambulating, but this seems to be consistent with prior. Also, she has very good home care. She is not appear to need acute hospitalization. I advised close PCP follow-up.  Of note, prior to discharge, patient reports that her sister recently died. She feels sad about this. This could be contributing to her decreasing functional status. I think this is also something that needs to be addressed by her primary doctor.  Arbie Cookey, MD 02/10/14 [place], MD 02/10/14 [phone]

## 2014-02-10 NOTE — ED Notes (Signed)
PTAR contacted by Art Secretary

## 2014-02-10 NOTE — Telephone Encounter (Signed)
Patient aware of additional instructions to check B/P while taking Robitussin DM

## 2014-02-10 NOTE — ED Notes (Signed)
Bed: WA01 Expected date:  Expected time:  Means of arrival:  Comments: Ems- 81 yo F weakness

## 2014-02-10 NOTE — ED Notes (Signed)
Per EMS-from independent living area. Has home health RN with her-RN called d/t patient's intermittent dizzy spells today. Has had cough/cold for "a few days" that has progressively gotten worse. RN reports diarrhea yesterday. A&O to baseline. Hx dementia. Denies pain/discomfort-only weakness. VS: BP 174/94 HR 66 SpO2 98% on RA. CBG 110. NSR on monitor. 20 G PIV placed in right hand.

## 2014-02-10 NOTE — ED Notes (Signed)
Attempted to ambulate patient with walker. Family and caregiver state patient is able to walk with walker at baseline. Unable to walk 2-3 steps without loss of balance and loss of footing. Patient returned to bed. Bed rails up. MD [place] aware.

## 2014-02-11 DIAGNOSIS — F329 Major depressive disorder, single episode, unspecified: Secondary | ICD-10-CM | POA: Diagnosis not present

## 2014-02-11 DIAGNOSIS — R531 Weakness: Secondary | ICD-10-CM | POA: Diagnosis not present

## 2014-02-11 DIAGNOSIS — G309 Alzheimer's disease, unspecified: Secondary | ICD-10-CM | POA: Diagnosis not present

## 2014-02-11 DIAGNOSIS — R197 Diarrhea, unspecified: Secondary | ICD-10-CM | POA: Diagnosis not present

## 2014-02-11 DIAGNOSIS — M6281 Muscle weakness (generalized): Secondary | ICD-10-CM | POA: Diagnosis not present

## 2014-02-11 DIAGNOSIS — N189 Chronic kidney disease, unspecified: Secondary | ICD-10-CM | POA: Diagnosis not present

## 2014-02-11 DIAGNOSIS — I129 Hypertensive chronic kidney disease with stage 1 through stage 4 chronic kidney disease, or unspecified chronic kidney disease: Secondary | ICD-10-CM | POA: Diagnosis not present

## 2014-02-11 DIAGNOSIS — F0280 Dementia in other diseases classified elsewhere without behavioral disturbance: Secondary | ICD-10-CM | POA: Diagnosis not present

## 2014-02-11 LAB — URINE CULTURE: Colony Count: 30000

## 2014-02-11 NOTE — Progress Notes (Signed)
CSW met with patient at bedside. Care giver Michelle [name] was present of [place] care. Patient states she lives at [facility] in [place] in their independent living unit. Patient says she is able to complete her ADL's independently.   Per patient, she presents to the ED today because of dizziness,coughs and weakness in her legs and feet. Patient states she has a medical alert bracelet an says she has a care giver everyday during the day and night hours.  Care giver suggested her daughter Michelle [name] has a contact at [phone] [address] [place] [phone] ED CSW 02/11/2014 12:22 AM

## 2014-02-13 ENCOUNTER — Ambulatory Visit (INDEPENDENT_AMBULATORY_CARE_PROVIDER_SITE_OTHER): Payer: Medicare Other | Admitting: Internal Medicine

## 2014-02-13 ENCOUNTER — Other Ambulatory Visit: Payer: Self-pay | Admitting: Internal Medicine

## 2014-02-13 ENCOUNTER — Encounter: Payer: Self-pay | Admitting: Internal Medicine

## 2014-02-13 VITALS — BP 126/82 | HR 72 | Temp 100.1°F | Resp 10

## 2014-02-13 DIAGNOSIS — G309 Alzheimer's disease, unspecified: Secondary | ICD-10-CM | POA: Diagnosis not present

## 2014-02-13 DIAGNOSIS — E860 Dehydration: Secondary | ICD-10-CM | POA: Diagnosis not present

## 2014-02-13 DIAGNOSIS — W19XXXS Unspecified fall, sequela: Secondary | ICD-10-CM

## 2014-02-13 DIAGNOSIS — J208 Acute bronchitis due to other specified organisms: Secondary | ICD-10-CM

## 2014-02-13 DIAGNOSIS — J209 Acute bronchitis, unspecified: Secondary | ICD-10-CM

## 2014-02-13 DIAGNOSIS — R531 Weakness: Secondary | ICD-10-CM

## 2014-02-13 DIAGNOSIS — F0280 Dementia in other diseases classified elsewhere without behavioral disturbance: Secondary | ICD-10-CM | POA: Diagnosis not present

## 2014-02-13 MED ORDER — ALBUTEROL SULFATE (2.5 MG/3ML) 0.083% IN NEBU
2.5000 mg | INHALATION_SOLUTION | Freq: Once | RESPIRATORY_TRACT | Status: AC
  Administered 2014-02-13: 2.5 mg via RESPIRATORY_TRACT

## 2014-02-13 MED ORDER — BENZONATATE 100 MG PO CAPS: 100.0000 mg | ORAL_CAPSULE | Freq: Three times a day (TID) | ORAL | Status: DC | PRN

## 2014-02-13 MED ORDER — AMLODIPINE BESYLATE 5 MG PO TABS: 5.0000 mg | ORAL_TABLET | Freq: Every day | ORAL | Status: DC

## 2014-02-13 MED ORDER — AZITHROMYCIN 250 MG PO TABS: ORAL_TABLET | ORAL | Status: DC

## 2014-02-13 MED ORDER — PREDNISONE 10 MG PO TABS: ORAL_TABLET | ORAL | Status: DC

## 2014-02-13 MED ORDER — ALBUTEROL SULFATE (2.5 MG/3ML) 0.083% IN NEBU: 2.5000 mg | INHALATION_SOLUTION | Freq: Once | RESPIRATORY_TRACT | Status: DC

## 2014-02-13 MED ORDER — ALBUTEROL SULFATE HFA 108 (90 BASE) MCG/ACT IN AERS: INHALATION_SPRAY | RESPIRATORY_TRACT | Status: DC

## 2014-02-13 MED ORDER — DORZOLAMIDE HCL 2 % OP SOLN: 1.0000 [drp] | Freq: Two times a day (BID) | OPHTHALMIC | Status: DC

## 2014-02-13 NOTE — Patient Instructions (Signed)
Continue overnight care due to her weakness  Push fluids and rest  Finish all medications prescribed today  F/u with Dr Mariea Clonts as scheduled next month  RTO if symptoms persist or worsen

## 2014-02-13 NOTE — Progress Notes (Signed)
Patient ID: Michelle [name], female   DOB: 1920/12/13, 81 y.o.   MRN: [id number]    Facility  [facility]    Place of Service:   OFFICE   Allergies  Allergen Reactions  . Codeine     States she blacks out    Chief Complaint  Patient presents with  . Hospitalization Follow-up    Seen in ED on 02/10/14 for generalized weakness. Patient with cough and chest congestion x 1 week also    HPI:   81 yo female seen today for ER f/u. She presented to the ED on 1/12th with weakness and cough. CXR neg. Labs revealed worsening Creatinine. She had O2 sat of 88% RA. She c/o 1 week hx productive cough with white sputum, CP, SOB, lethargy and worsening weakness. No f/c. Appetite reduced today. Interruption of sleep last night due to cough. Confused today. Tried Robitussin DM and tylenol OTC but not really helping.  Daughter-in-law requests she have care 24 hrs/day due to current weak state. Pt's home care aid and supervisor present today  Medications: Patient's Medications  New Prescriptions   No medications on file  Previous Medications   ACETAMINOPHEN (TYLENOL) 500 MG TABLET    Take 500 mg by mouth every 6 (six) hours as needed for mild pain or headache.   ASPIRIN 81 MG CHEWABLE TABLET    Chew 81 mg by mouth daily.    BIMATOPROST (LUMIGAN) 0.01 % SOLN    Place 1 drop into both eyes at bedtime.    BRIMONIDINE-TIMOLOL (COMBIGAN) 0.2-0.5 % OPHTHALMIC SOLUTION    Place 1 drop into both eyes 2 (two) times daily.    CALCIUM CARBONATE 200 MG CAPSULE    Take 200 mg by mouth daily.    CITALOPRAM (CELEXA) 10 MG TABLET    TAKE 1 TABLET ONCE DAILY.   GUAIFENESIN (ROBITUSSIN) 100 MG/5ML LIQUID    Take 600 mg by mouth 3 (three) times daily as needed for cough.   MULTIPLE VITAMIN (MULTIVITAMIN WITH MINERALS) TABS    Take 1 tablet by mouth daily.   TRAMADOL (ULTRAM) 50 MG TABLET    TAKE 1 TABLET 3 TIMES DAILY AS NEEDED FOR PAIN.  Modified Medications   Modified Medication Previous Medication   AMLODIPINE  (NORVASC) 5 MG TABLET amLODipine (NORVASC) 5 MG tablet      Take 1 tablet (5 mg total) by mouth daily.    TAKE 1 TABLET DAILY.   DORZOLAMIDE (TRUSOPT) 2 % OPHTHALMIC SOLUTION dorzolamide (TRUSOPT) 2 % ophthalmic solution      Place 1 drop into both eyes 2 (two) times daily.    Place 1 drop into both eyes 2 (two) times daily.     LIDOCAINE (LIDODERM) 5 % lidocaine (LIDODERM) 5 %      APPLY 1 PATCH TO EACH SHOULDER , KEEP ON 12 HOURS, THEN KEEP OFF FOR 12 HOURS.    Apply one patch to each shoulder, keep on 12 hours, then keep off for 12 hours for pain  Discontinued Medications   No medications on file     Review of Systems  As above. Pt unable to provide hx due to mental status. Hx obtained from home care aid and her supervisor  Filed Vitals:   02/13/14 1037  BP: 126/82  Pulse: 72  Temp: 100.1 F (37.8 C)  TempSrc: Oral  Resp: 10  SpO2: 90%   There is no weight on file to calculate BMI.  Physical Exam  CONSTITUTIONAL: Looks ill with minimum conversational dyspnea.  Awake and alert HEENT: TMs appear nml. PERRLA. Oropharynx clear and without exudate. MM dry NECK: Supple. Nontender. No palpable cervical or supraclavicular lymph nodes.   CVS: Regular rate with 1/6 SEmurmur. No gallop or rub. LUNGS: b/l end expiratory wheezing with prolonged expiratory phase. No rales or rhonchi. EXTREMITIES: Trace LE edema b/l. Distal pulses palpable. No calf tenderness PSYCH: Appears confused  Labs reviewed: Admission on 02/10/2014, Discharged on 02/11/2014  Component Date Value Ref Range Status  . Sodium 02/10/2014 136  135 - 145 mmol/L Final   Please note change in reference range.  . Potassium 02/10/2014 4.4  3.5 - 5.1 mmol/L Final   Please note change in reference range.  . Chloride 02/10/2014 107  96 - 112 mEq/L Final  . CO2 02/10/2014 24  19 - 32 mmol/L Final  . Glucose, Bld 02/10/2014 113* 70 - 99 mg/dL Final  . BUN 78/29/5621 27* 6 - 23 mg/dL Final  . Creatinine, Ser 02/10/2014 1.50*  0.50 - 1.10 mg/dL Final  . Calcium 30/86/5784 8.7  8.4 - 10.5 mg/dL Final  . GFR calc non Af Amer 02/10/2014 29* >90 mL/min Final  . GFR calc Af Amer 02/10/2014 34* >90 mL/min Final   Comment: (NOTE) The eGFR has been calculated using the CKD EPI equation. This calculation has not been validated in all clinical situations. eGFR's persistently <90 mL/min signify possible Chronic Kidney Disease.   . Anion gap 02/10/2014 5  5 - 15 Final  . WBC 02/10/2014 3.9* 4.0 - 10.5 K/uL Final  . RBC 02/10/2014 3.78* 3.87 - 5.11 MIL/uL Final  . Hemoglobin 02/10/2014 11.4* 12.0 - 15.0 g/dL Final  . HCT 69/62/9528 36.6  36.0 - 46.0 % Final  . MCV 02/10/2014 96.8  78.0 - 100.0 fL Final  . MCH 02/10/2014 30.2  26.0 - 34.0 pg Final  . MCHC 02/10/2014 31.1  30.0 - 36.0 g/dL Final  . RDW 41/32/4401 13.4  11.5 - 15.5 % Final  . Platelets 02/10/2014 236  150 - 400 K/uL Final  . Neutrophils Relative % 02/10/2014 62  43 - 77 % Final  . Neutro Abs 02/10/2014 2.4  1.7 - 7.7 K/uL Final  . Lymphocytes Relative 02/10/2014 21  12 - 46 % Final  . Lymphs Abs 02/10/2014 0.8  0.7 - 4.0 K/uL Final  . Monocytes Relative 02/10/2014 13* 3 - 12 % Final  . Monocytes Absolute 02/10/2014 0.5  0.1 - 1.0 K/uL Final  . Eosinophils Relative 02/10/2014 3  0 - 5 % Final  . Eosinophils Absolute 02/10/2014 0.1  0.0 - 0.7 K/uL Final  . Basophils Relative 02/10/2014 1  0 - 1 % Final  . Basophils Absolute 02/10/2014 0.0  0.0 - 0.1 K/uL Final  . Color, Urine 02/10/2014 YELLOW  YELLOW Final  . APPearance 02/10/2014 CLOUDY* CLEAR Final  . Specific Gravity, Urine 02/10/2014 1.025  1.005 - 1.030 Final  . pH 02/10/2014 5.0  5.0 - 8.0 Final  . Glucose, UA 02/10/2014 NEGATIVE  NEGATIVE mg/dL Final  . Hgb urine dipstick 02/10/2014 NEGATIVE  NEGATIVE Final  . Bilirubin Urine 02/10/2014 NEGATIVE  NEGATIVE Final  . Ketones, ur 02/10/2014 NEGATIVE  NEGATIVE mg/dL Final  . Protein, ur 02/72/5366 NEGATIVE  NEGATIVE mg/dL Final  . Urobilinogen, UA  02/10/2014 0.2  0.0 - 1.0 mg/dL Final  . Nitrite 44/03/4740 NEGATIVE  NEGATIVE Final  . Leukocytes, UA 02/10/2014 NEGATIVE  NEGATIVE Final   MICROSCOPIC NOT DONE ON URINES WITH NEGATIVE PROTEIN, BLOOD, LEUKOCYTES, NITRITE, OR GLUCOSE <  1000 mg/dL.  . Troponin I 02/10/2014 <0.03  <0.031 ng/mL Final   Comment:        NO INDICATION OF MYOCARDIAL INJURY. Please note change in reference range.   Marland Kitchen Specimen Description 02/10/2014 URINE, CLEAN CATCH   Final  . Special Requests 02/10/2014 NONE   Final  . Colony Count 02/10/2014    Final                   Value:30,000 COLONIES/ML Performed at [facility]   . Culture 02/10/2014    Final                   Value:Multiple bacterial morphotypes present, none predominant. Suggest appropriate recollection if clinically indicated. Performed at [facility]   . Report Status 02/10/2014 02/11/2014 FINAL   Final  Admission on 02/06/2014, Discharged on 02/06/2014  Component Date Value Ref Range Status  . WBC 02/06/2014 5.4  4.0 - 10.5 K/uL Final  . RBC 02/06/2014 3.91  3.87 - 5.11 MIL/uL Final  . Hemoglobin 02/06/2014 11.7* 12.0 - 15.0 g/dL Final  . HCT 14/78/2956 37.5  36.0 - 46.0 % Final  . MCV 02/06/2014 95.9  78.0 - 100.0 fL Final  . MCH 02/06/2014 29.9  26.0 - 34.0 pg Final  . MCHC 02/06/2014 31.2  30.0 - 36.0 g/dL Final  . RDW 21/30/8657 13.2  11.5 - 15.5 % Final  . Platelets 02/06/2014 236  150 - 400 K/uL Final  . Neutrophils Relative % 02/06/2014 62  43 - 77 % Final  . Neutro Abs 02/06/2014 3.4  1.7 - 7.7 K/uL Final  . Lymphocytes Relative 02/06/2014 24  12 - 46 % Final  . Lymphs Abs 02/06/2014 1.3  0.7 - 4.0 K/uL Final  . Monocytes Relative 02/06/2014 12  3 - 12 % Final  . Monocytes Absolute 02/06/2014 0.6  0.1 - 1.0 K/uL Final  . Eosinophils Relative 02/06/2014 2  0 - 5 % Final  . Eosinophils Absolute 02/06/2014 0.1  0.0 - 0.7 K/uL Final  . Basophils Relative 02/06/2014 0  0 - 1 % Final  . Basophils Absolute 02/06/2014  0.0  0.0 - 0.1 K/uL Final  . Sodium 02/06/2014 137  135 - 145 mmol/L Final   Please note change in reference range.  . Potassium 02/06/2014 4.3  3.5 - 5.1 mmol/L Final   Please note change in reference range.  . Chloride 02/06/2014 103  96 - 112 mEq/L Final  . CO2 02/06/2014 26  19 - 32 mmol/L Final  . Glucose, Bld 02/06/2014 114* 70 - 99 mg/dL Final  . BUN 84/69/6295 17  6 - 23 mg/dL Final  . Creatinine, Ser 02/06/2014 1.10  0.50 - 1.10 mg/dL Final  . Calcium 28/41/3244 9.5  8.4 - 10.5 mg/dL Final  . Total Protein 02/06/2014 7.7  6.0 - 8.3 g/dL Final  . Albumin 02/01/7251 3.7  3.5 - 5.2 g/dL Final  . AST 66/44/0347 20  0 - 37 U/L Final  . ALT 02/06/2014 15  0 - 35 U/L Final  . Alkaline Phosphatase 02/06/2014 62  39 - 117 U/L Final  . Total Bilirubin 02/06/2014 1.1  0.3 - 1.2 mg/dL Final  . GFR calc non Af Amer 02/06/2014 43* >90 mL/min Final  . GFR calc Af Amer 02/06/2014 49* >90 mL/min Final   Comment: (NOTE) The eGFR has been calculated using the CKD EPI equation. This calculation has not been validated in all clinical situations. eGFR's persistently <  90 mL/min signify possible Chronic Kidney Disease.   . Anion gap 02/06/2014 8  5 - 15 Final  . Color, Urine 02/06/2014 YELLOW  YELLOW Final  . APPearance 02/06/2014 CLEAR  CLEAR Final  . Specific Gravity, Urine 02/06/2014 1.018  1.005 - 1.030 Final  . pH 02/06/2014 7.0  5.0 - 8.0 Final  . Glucose, UA 02/06/2014 NEGATIVE  NEGATIVE mg/dL Final  . Hgb urine dipstick 02/06/2014 NEGATIVE  NEGATIVE Final  . Bilirubin Urine 02/06/2014 NEGATIVE  NEGATIVE Final  . Ketones, ur 02/06/2014 NEGATIVE  NEGATIVE mg/dL Final  . Protein, ur 84/13/2440 NEGATIVE  NEGATIVE mg/dL Final  . Urobilinogen, UA 02/06/2014 1.0  0.0 - 1.0 mg/dL Final  . Nitrite 11/26/2534 NEGATIVE  NEGATIVE Final  . Leukocytes, UA 02/06/2014 NEGATIVE  NEGATIVE Final   MICROSCOPIC NOT DONE ON URINES WITH NEGATIVE PROTEIN, BLOOD, LEUKOCYTES, NITRITE, OR GLUCOSE <1000 mg/dL.    Marland Kitchen Specimen Description 02/06/2014 URINE, CATHETERIZED   Final  . Special Requests 02/06/2014 NONE   Final  . Colony Count 02/06/2014    Final                   Value:NO GROWTH Performed at [facility]   . Culture 02/06/2014    Final                   Value:NO GROWTH Performed at [facility]   . Report Status 02/06/2014 02/07/2014 FINAL   Final  . Total CK 02/06/2014 74  7 - 177 U/L Final  . Troponin I 02/06/2014 <0.03  <0.031 ng/mL Final   Comment:        NO INDICATION OF MYOCARDIAL INJURY. Please note change in reference range.   Admission on 01/18/2014, Discharged on 01/19/2014  Component Date Value Ref Range Status  . Sodium 01/18/2014 142  137 - 147 mEq/L Final  . Potassium 01/18/2014 4.3  3.7 - 5.3 mEq/L Final  . Chloride 01/18/2014 105  96 - 112 mEq/L Final  . BUN 01/18/2014 27* 6 - 23 mg/dL Final  . Creatinine, Ser 01/18/2014 1.40* 0.50 - 1.10 mg/dL Final  . Glucose, Bld 64/40/3474 137* 70 - 99 mg/dL Final  . Calcium, Ion 25/95/6387 1.19  1.13 - 1.30 mmol/L Final  . TCO2 01/18/2014 22  0 - 100 mmol/L Final  . Hemoglobin 01/18/2014 11.9* 12.0 - 15.0 g/dL Final  . HCT 56/43/3295 35.0* 36.0 - 46.0 % Final  . Troponin i, poc 01/18/2014 0.00  0.00 - 0.08 ng/mL Final  . Comment 3 01/18/2014          Final   Comment: Due to the release kinetics of cTnI, a negative result within the first hours of the onset of symptoms does not rule out myocardial infarction with certainty. If myocardial infarction is still suspected, repeat the test at appropriate intervals.   Office Visit on 12/16/2013  Component Date Value Ref Range Status  . Color, UA 12/16/2013 Yellow   Final  . Clarity, UA 12/16/2013 Clear   Final  . Glucose, UA 12/16/2013 Neg   Final  . Bilirubin, UA 12/16/2013 Neg   Final  . Ketones, UA 12/16/2013 Neg   Final  . Spec Grav, UA 12/16/2013 1.015   Final  . Blood, UA 12/16/2013 Neg   Final  . pH, UA 12/16/2013 6.0   Final  . Protein, UA  12/16/2013 Neg   Final  . Urobilinogen, UA 12/16/2013 0.2   Final  . Nitrite, UA 12/16/2013 Neg  Final  . Leukocytes, UA 12/16/2013 Trace   Final  . Urine Culture, Routine 12/16/2013 Final report   Final  . Result 1 12/16/2013 Comment   Final   Comment: Mixed urogenital flora 25,000-50,000 colony forming units per mL      Assessment/Plan      ICD-9-CM ICD-10-CM   1. Acute bronchitis due to infection 466.0 J20.8 albuterol (PROVENTIL) (2.5 MG/3ML) 0.083% nebulizer solution     azithromycin (ZITHROMAX) 250 MG tablet     predniSONE (DELTASONE) 10 MG tablet     benzonatate (TESSALON PERLES) 100 MG capsule     albuterol (PROVENTIL HFA;VENTOLIN HFA) 108 (90 BASE) MCG/ACT inhaler     albuterol (PROVENTIL) (2.5 MG/3ML) 0.083% nebulizer solution 2.5 mg  2. General weakness 780.79 R53.1   3. Alzheimer's dementia 331.0 G30.9    294.10 F02.80   4. Fall, sequela 909.4 W19.XXXS    E929.3    5. Dehydration 276.51 E86.0    with reduced renal function    --pt with improved breathing after nebulizer in office  --push fluids and rest  --recommend continue overnight care until weakness improves  --keep appt with Dr Renato Gails next month  --RTO if sx's persist or worsen   Jerris Keltz S. Ancil Linsey  [facility] [address] [place], [place] [address] [phone] Office (Wednesdays and Fridays 8 AM - 5 PM) [phone] Cell (Monday-Friday 8 AM - 5 PM)

## 2014-02-16 DIAGNOSIS — F0280 Dementia in other diseases classified elsewhere without behavioral disturbance: Secondary | ICD-10-CM

## 2014-02-16 DIAGNOSIS — M6281 Muscle weakness (generalized): Secondary | ICD-10-CM | POA: Diagnosis not present

## 2014-02-16 DIAGNOSIS — I129 Hypertensive chronic kidney disease with stage 1 through stage 4 chronic kidney disease, or unspecified chronic kidney disease: Secondary | ICD-10-CM

## 2014-02-16 DIAGNOSIS — N189 Chronic kidney disease, unspecified: Secondary | ICD-10-CM | POA: Diagnosis not present

## 2014-02-16 DIAGNOSIS — F329 Major depressive disorder, single episode, unspecified: Secondary | ICD-10-CM | POA: Diagnosis not present

## 2014-02-16 DIAGNOSIS — G309 Alzheimer's disease, unspecified: Secondary | ICD-10-CM | POA: Diagnosis not present

## 2014-02-16 DIAGNOSIS — F419 Anxiety disorder, unspecified: Secondary | ICD-10-CM | POA: Diagnosis not present

## 2014-02-17 ENCOUNTER — Telehealth: Payer: Self-pay | Admitting: *Deleted

## 2014-02-17 NOTE — Telephone Encounter (Signed)
Received FL2 Form from [facility]. Filled out and given to Dr. Mariea Clonts to review and sign. To be faxed back to [phone] Attn: Finis Bud

## 2014-02-19 DIAGNOSIS — M6281 Muscle weakness (generalized): Secondary | ICD-10-CM | POA: Diagnosis not present

## 2014-02-19 DIAGNOSIS — N189 Chronic kidney disease, unspecified: Secondary | ICD-10-CM | POA: Diagnosis not present

## 2014-02-19 DIAGNOSIS — G309 Alzheimer's disease, unspecified: Secondary | ICD-10-CM | POA: Diagnosis not present

## 2014-02-19 DIAGNOSIS — F0280 Dementia in other diseases classified elsewhere without behavioral disturbance: Secondary | ICD-10-CM | POA: Diagnosis not present

## 2014-02-19 DIAGNOSIS — F329 Major depressive disorder, single episode, unspecified: Secondary | ICD-10-CM | POA: Diagnosis not present

## 2014-02-19 DIAGNOSIS — I129 Hypertensive chronic kidney disease with stage 1 through stage 4 chronic kidney disease, or unspecified chronic kidney disease: Secondary | ICD-10-CM | POA: Diagnosis not present

## 2014-02-23 ENCOUNTER — Ambulatory Visit: Payer: Medicare Other | Admitting: Oncology

## 2014-02-23 ENCOUNTER — Other Ambulatory Visit: Payer: Medicare Other

## 2014-02-23 DIAGNOSIS — G309 Alzheimer's disease, unspecified: Secondary | ICD-10-CM | POA: Diagnosis not present

## 2014-02-23 DIAGNOSIS — N189 Chronic kidney disease, unspecified: Secondary | ICD-10-CM | POA: Diagnosis not present

## 2014-02-23 DIAGNOSIS — M6281 Muscle weakness (generalized): Secondary | ICD-10-CM | POA: Diagnosis not present

## 2014-02-23 DIAGNOSIS — I129 Hypertensive chronic kidney disease with stage 1 through stage 4 chronic kidney disease, or unspecified chronic kidney disease: Secondary | ICD-10-CM | POA: Diagnosis not present

## 2014-02-23 DIAGNOSIS — F329 Major depressive disorder, single episode, unspecified: Secondary | ICD-10-CM | POA: Diagnosis not present

## 2014-02-23 DIAGNOSIS — F0280 Dementia in other diseases classified elsewhere without behavioral disturbance: Secondary | ICD-10-CM | POA: Diagnosis not present

## 2014-02-24 DIAGNOSIS — F329 Major depressive disorder, single episode, unspecified: Secondary | ICD-10-CM | POA: Diagnosis not present

## 2014-02-24 DIAGNOSIS — M6281 Muscle weakness (generalized): Secondary | ICD-10-CM | POA: Diagnosis not present

## 2014-02-24 DIAGNOSIS — N189 Chronic kidney disease, unspecified: Secondary | ICD-10-CM | POA: Diagnosis not present

## 2014-02-24 DIAGNOSIS — G309 Alzheimer's disease, unspecified: Secondary | ICD-10-CM | POA: Diagnosis not present

## 2014-02-24 DIAGNOSIS — F0280 Dementia in other diseases classified elsewhere without behavioral disturbance: Secondary | ICD-10-CM | POA: Diagnosis not present

## 2014-02-24 DIAGNOSIS — I129 Hypertensive chronic kidney disease with stage 1 through stage 4 chronic kidney disease, or unspecified chronic kidney disease: Secondary | ICD-10-CM | POA: Diagnosis not present

## 2014-03-05 ENCOUNTER — Ambulatory Visit: Payer: Medicare Other | Admitting: Internal Medicine

## 2014-03-10 DIAGNOSIS — Z9181 History of falling: Secondary | ICD-10-CM | POA: Diagnosis not present

## 2014-03-10 DIAGNOSIS — M6281 Muscle weakness (generalized): Secondary | ICD-10-CM | POA: Diagnosis not present

## 2014-03-10 DIAGNOSIS — R05 Cough: Secondary | ICD-10-CM | POA: Diagnosis not present

## 2014-03-10 DIAGNOSIS — G894 Chronic pain syndrome: Secondary | ICD-10-CM | POA: Diagnosis not present

## 2014-03-10 DIAGNOSIS — K219 Gastro-esophageal reflux disease without esophagitis: Secondary | ICD-10-CM | POA: Diagnosis not present

## 2014-03-10 DIAGNOSIS — G309 Alzheimer's disease, unspecified: Secondary | ICD-10-CM | POA: Diagnosis not present

## 2014-03-10 DIAGNOSIS — I1310 Hypertensive heart and chronic kidney disease without heart failure, with stage 1 through stage 4 chronic kidney disease, or unspecified chronic kidney disease: Secondary | ICD-10-CM | POA: Diagnosis not present

## 2014-03-10 DIAGNOSIS — I10 Essential (primary) hypertension: Secondary | ICD-10-CM | POA: Diagnosis not present

## 2014-03-11 DIAGNOSIS — I10 Essential (primary) hypertension: Secondary | ICD-10-CM | POA: Diagnosis not present

## 2014-03-11 DIAGNOSIS — Z0000 Encounter for general adult medical examination without abnormal findings: Secondary | ICD-10-CM | POA: Diagnosis not present

## 2014-03-11 DIAGNOSIS — R6889 Other general symptoms and signs: Secondary | ICD-10-CM | POA: Diagnosis not present

## 2014-03-12 DIAGNOSIS — E039 Hypothyroidism, unspecified: Secondary | ICD-10-CM | POA: Diagnosis not present

## 2014-03-12 DIAGNOSIS — N183 Chronic kidney disease, stage 3 (moderate): Secondary | ICD-10-CM | POA: Diagnosis not present

## 2014-03-12 DIAGNOSIS — E559 Vitamin D deficiency, unspecified: Secondary | ICD-10-CM | POA: Diagnosis not present

## 2014-03-12 DIAGNOSIS — I10 Essential (primary) hypertension: Secondary | ICD-10-CM | POA: Diagnosis not present

## 2014-03-12 DIAGNOSIS — E782 Mixed hyperlipidemia: Secondary | ICD-10-CM | POA: Diagnosis not present

## 2014-03-12 DIAGNOSIS — E538 Deficiency of other specified B group vitamins: Secondary | ICD-10-CM | POA: Diagnosis not present

## 2014-03-12 DIAGNOSIS — E531 Pyridoxine deficiency: Secondary | ICD-10-CM | POA: Diagnosis not present

## 2014-03-12 DIAGNOSIS — E119 Type 2 diabetes mellitus without complications: Secondary | ICD-10-CM | POA: Diagnosis not present

## 2014-03-17 DIAGNOSIS — M791 Myalgia: Secondary | ICD-10-CM | POA: Diagnosis not present

## 2014-03-17 DIAGNOSIS — R531 Weakness: Secondary | ICD-10-CM | POA: Diagnosis not present

## 2014-03-17 DIAGNOSIS — H409 Unspecified glaucoma: Secondary | ICD-10-CM | POA: Diagnosis not present

## 2014-03-17 DIAGNOSIS — F0280 Dementia in other diseases classified elsewhere without behavioral disturbance: Secondary | ICD-10-CM | POA: Diagnosis not present

## 2014-03-17 DIAGNOSIS — I10 Essential (primary) hypertension: Secondary | ICD-10-CM | POA: Diagnosis not present

## 2014-03-17 DIAGNOSIS — G309 Alzheimer's disease, unspecified: Secondary | ICD-10-CM | POA: Diagnosis not present

## 2014-03-18 DIAGNOSIS — G309 Alzheimer's disease, unspecified: Secondary | ICD-10-CM | POA: Diagnosis not present

## 2014-03-18 DIAGNOSIS — R531 Weakness: Secondary | ICD-10-CM | POA: Diagnosis not present

## 2014-03-18 DIAGNOSIS — H409 Unspecified glaucoma: Secondary | ICD-10-CM | POA: Diagnosis not present

## 2014-03-18 DIAGNOSIS — M791 Myalgia: Secondary | ICD-10-CM | POA: Diagnosis not present

## 2014-03-18 DIAGNOSIS — F0280 Dementia in other diseases classified elsewhere without behavioral disturbance: Secondary | ICD-10-CM | POA: Diagnosis not present

## 2014-03-18 DIAGNOSIS — I10 Essential (primary) hypertension: Secondary | ICD-10-CM | POA: Diagnosis not present

## 2014-03-19 DIAGNOSIS — G309 Alzheimer's disease, unspecified: Secondary | ICD-10-CM | POA: Diagnosis not present

## 2014-03-19 DIAGNOSIS — I10 Essential (primary) hypertension: Secondary | ICD-10-CM | POA: Diagnosis not present

## 2014-03-19 DIAGNOSIS — F0280 Dementia in other diseases classified elsewhere without behavioral disturbance: Secondary | ICD-10-CM | POA: Diagnosis not present

## 2014-03-19 DIAGNOSIS — M791 Myalgia: Secondary | ICD-10-CM | POA: Diagnosis not present

## 2014-03-19 DIAGNOSIS — R531 Weakness: Secondary | ICD-10-CM | POA: Diagnosis not present

## 2014-03-19 DIAGNOSIS — H409 Unspecified glaucoma: Secondary | ICD-10-CM | POA: Diagnosis not present

## 2014-03-23 DIAGNOSIS — G309 Alzheimer's disease, unspecified: Secondary | ICD-10-CM | POA: Diagnosis not present

## 2014-03-23 DIAGNOSIS — R531 Weakness: Secondary | ICD-10-CM | POA: Diagnosis not present

## 2014-03-23 DIAGNOSIS — F0280 Dementia in other diseases classified elsewhere without behavioral disturbance: Secondary | ICD-10-CM | POA: Diagnosis not present

## 2014-03-23 DIAGNOSIS — H409 Unspecified glaucoma: Secondary | ICD-10-CM | POA: Diagnosis not present

## 2014-03-23 DIAGNOSIS — I10 Essential (primary) hypertension: Secondary | ICD-10-CM | POA: Diagnosis not present

## 2014-03-23 DIAGNOSIS — M791 Myalgia: Secondary | ICD-10-CM | POA: Diagnosis not present

## 2014-03-24 DIAGNOSIS — R531 Weakness: Secondary | ICD-10-CM | POA: Diagnosis not present

## 2014-03-24 DIAGNOSIS — H409 Unspecified glaucoma: Secondary | ICD-10-CM | POA: Diagnosis not present

## 2014-03-24 DIAGNOSIS — I10 Essential (primary) hypertension: Secondary | ICD-10-CM | POA: Diagnosis not present

## 2014-03-24 DIAGNOSIS — M791 Myalgia: Secondary | ICD-10-CM | POA: Diagnosis not present

## 2014-03-24 DIAGNOSIS — G309 Alzheimer's disease, unspecified: Secondary | ICD-10-CM | POA: Diagnosis not present

## 2014-03-24 DIAGNOSIS — F0280 Dementia in other diseases classified elsewhere without behavioral disturbance: Secondary | ICD-10-CM | POA: Diagnosis not present

## 2014-03-25 DIAGNOSIS — R300 Dysuria: Secondary | ICD-10-CM | POA: Diagnosis not present

## 2014-03-26 ENCOUNTER — Inpatient Hospital Stay (HOSPITAL_COMMUNITY)
Admission: EM | Admit: 2014-03-26 | Discharge: 2014-03-29 | DRG: 086 | Disposition: A | Discharge status: Skilled Nursing Facility | Payer: Medicare Other | Attending: Internal Medicine | Admitting: Internal Medicine

## 2014-03-26 ENCOUNTER — Emergency Department (HOSPITAL_COMMUNITY): Payer: Medicare Other

## 2014-03-26 ENCOUNTER — Encounter (HOSPITAL_COMMUNITY): Payer: Self-pay | Admitting: Emergency Medicine

## 2014-03-26 DIAGNOSIS — N390 Urinary tract infection, site not specified: Secondary | ICD-10-CM | POA: Diagnosis present

## 2014-03-26 DIAGNOSIS — N179 Acute kidney failure, unspecified: Secondary | ICD-10-CM | POA: Diagnosis present

## 2014-03-26 DIAGNOSIS — R531 Weakness: Secondary | ICD-10-CM | POA: Diagnosis not present

## 2014-03-26 DIAGNOSIS — S065X9A Traumatic subdural hemorrhage with loss of consciousness of unspecified duration, initial encounter: Secondary | ICD-10-CM | POA: Diagnosis present

## 2014-03-26 DIAGNOSIS — W19XXXD Unspecified fall, subsequent encounter: Secondary | ICD-10-CM

## 2014-03-26 DIAGNOSIS — S065X2A Traumatic subdural hemorrhage with loss of consciousness of 31 minutes to 59 minutes, initial encounter: Secondary | ICD-10-CM | POA: Diagnosis not present

## 2014-03-26 DIAGNOSIS — W06XXXA Fall from bed, initial encounter: Secondary | ICD-10-CM | POA: Diagnosis present

## 2014-03-26 DIAGNOSIS — N183 Chronic kidney disease, stage 3 (moderate): Secondary | ICD-10-CM | POA: Diagnosis present

## 2014-03-26 DIAGNOSIS — S065X0A Traumatic subdural hemorrhage without loss of consciousness, initial encounter: Principal | ICD-10-CM | POA: Diagnosis present

## 2014-03-26 DIAGNOSIS — Z853 Personal history of malignant neoplasm of breast: Secondary | ICD-10-CM | POA: Diagnosis not present

## 2014-03-26 DIAGNOSIS — R9431 Abnormal electrocardiogram [ECG] [EKG]: Secondary | ICD-10-CM | POA: Diagnosis not present

## 2014-03-26 DIAGNOSIS — E785 Hyperlipidemia, unspecified: Secondary | ICD-10-CM | POA: Diagnosis present

## 2014-03-26 DIAGNOSIS — M25551 Pain in right hip: Secondary | ICD-10-CM | POA: Diagnosis not present

## 2014-03-26 DIAGNOSIS — I129 Hypertensive chronic kidney disease with stage 1 through stage 4 chronic kidney disease, or unspecified chronic kidney disease: Secondary | ICD-10-CM | POA: Diagnosis present

## 2014-03-26 DIAGNOSIS — Z823 Family history of stroke: Secondary | ICD-10-CM

## 2014-03-26 DIAGNOSIS — S0990XA Unspecified injury of head, initial encounter: Secondary | ICD-10-CM | POA: Diagnosis not present

## 2014-03-26 DIAGNOSIS — S064X0A Epidural hemorrhage without loss of consciousness, initial encounter: Secondary | ICD-10-CM | POA: Diagnosis not present

## 2014-03-26 DIAGNOSIS — G309 Alzheimer's disease, unspecified: Secondary | ICD-10-CM | POA: Diagnosis present

## 2014-03-26 DIAGNOSIS — I10 Essential (primary) hypertension: Secondary | ICD-10-CM

## 2014-03-26 DIAGNOSIS — F0280 Dementia in other diseases classified elsewhere without behavioral disturbance: Secondary | ICD-10-CM

## 2014-03-26 DIAGNOSIS — Z803 Family history of malignant neoplasm of breast: Secondary | ICD-10-CM | POA: Diagnosis not present

## 2014-03-26 DIAGNOSIS — K589 Irritable bowel syndrome without diarrhea: Secondary | ICD-10-CM | POA: Diagnosis present

## 2014-03-26 DIAGNOSIS — K219 Gastro-esophageal reflux disease without esophagitis: Secondary | ICD-10-CM | POA: Diagnosis present

## 2014-03-26 DIAGNOSIS — R488 Other symbolic dysfunctions: Secondary | ICD-10-CM | POA: Diagnosis not present

## 2014-03-26 DIAGNOSIS — M353 Polymyalgia rheumatica: Secondary | ICD-10-CM | POA: Diagnosis present

## 2014-03-26 DIAGNOSIS — I6200 Nontraumatic subdural hemorrhage, unspecified: Secondary | ICD-10-CM

## 2014-03-26 DIAGNOSIS — Z8249 Family history of ischemic heart disease and other diseases of the circulatory system: Secondary | ICD-10-CM | POA: Diagnosis not present

## 2014-03-26 DIAGNOSIS — M6281 Muscle weakness (generalized): Secondary | ICD-10-CM | POA: Diagnosis not present

## 2014-03-26 DIAGNOSIS — W19XXXA Unspecified fall, initial encounter: Secondary | ICD-10-CM | POA: Diagnosis present

## 2014-03-26 DIAGNOSIS — S79912A Unspecified injury of left hip, initial encounter: Secondary | ICD-10-CM | POA: Diagnosis not present

## 2014-03-26 DIAGNOSIS — R1312 Dysphagia, oropharyngeal phase: Secondary | ICD-10-CM | POA: Diagnosis not present

## 2014-03-26 DIAGNOSIS — M1990 Unspecified osteoarthritis, unspecified site: Secondary | ICD-10-CM | POA: Diagnosis present

## 2014-03-26 DIAGNOSIS — S065X0D Traumatic subdural hemorrhage without loss of consciousness, subsequent encounter: Secondary | ICD-10-CM | POA: Diagnosis not present

## 2014-03-26 DIAGNOSIS — H409 Unspecified glaucoma: Secondary | ICD-10-CM | POA: Diagnosis present

## 2014-03-26 DIAGNOSIS — F0281 Dementia in other diseases classified elsewhere with behavioral disturbance: Secondary | ICD-10-CM | POA: Diagnosis present

## 2014-03-26 DIAGNOSIS — Z9181 History of falling: Secondary | ICD-10-CM | POA: Diagnosis not present

## 2014-03-26 DIAGNOSIS — Z800 Family history of malignant neoplasm of digestive organs: Secondary | ICD-10-CM | POA: Diagnosis not present

## 2014-03-26 DIAGNOSIS — Y92122 Bedroom in nursing home as the place of occurrence of the external cause: Secondary | ICD-10-CM

## 2014-03-26 DIAGNOSIS — R296 Repeated falls: Secondary | ICD-10-CM | POA: Diagnosis present

## 2014-03-26 DIAGNOSIS — S199XXA Unspecified injury of neck, initial encounter: Secondary | ICD-10-CM | POA: Diagnosis not present

## 2014-03-26 DIAGNOSIS — S065XAA Traumatic subdural hemorrhage with loss of consciousness status unknown, initial encounter: Secondary | ICD-10-CM | POA: Diagnosis present

## 2014-03-26 DIAGNOSIS — R262 Difficulty in walking, not elsewhere classified: Secondary | ICD-10-CM | POA: Diagnosis not present

## 2014-03-26 DIAGNOSIS — R259 Unspecified abnormal involuntary movements: Secondary | ICD-10-CM | POA: Diagnosis not present

## 2014-03-26 DIAGNOSIS — M25552 Pain in left hip: Secondary | ICD-10-CM | POA: Diagnosis not present

## 2014-03-26 DIAGNOSIS — S79911A Unspecified injury of right hip, initial encounter: Secondary | ICD-10-CM | POA: Diagnosis not present

## 2014-03-26 LAB — CBC WITH DIFFERENTIAL/PLATELET
BASOS ABS: 0 10*3/uL (ref 0.0–0.1)
BASOS PCT: 0 % (ref 0–1)
EOS ABS: 0 10*3/uL (ref 0.0–0.7)
Eosinophils Relative: 1 % (ref 0–5)
HCT: 33.8 % — ABNORMAL LOW (ref 36.0–46.0)
Hemoglobin: 10.1 g/dL — ABNORMAL LOW (ref 12.0–15.0)
LYMPHS PCT: 19 % (ref 12–46)
Lymphs Abs: 1 10*3/uL (ref 0.7–4.0)
MCH: 29.5 pg (ref 26.0–34.0)
MCHC: 29.9 g/dL — ABNORMAL LOW (ref 30.0–36.0)
MCV: 98.8 fL (ref 78.0–100.0)
MONO ABS: 0.5 10*3/uL (ref 0.1–1.0)
Monocytes Relative: 10 % (ref 3–12)
Neutro Abs: 3.9 10*3/uL (ref 1.7–7.7)
Neutrophils Relative %: 70 % (ref 43–77)
PLATELETS: 185 10*3/uL (ref 150–400)
RBC: 3.42 MIL/uL — ABNORMAL LOW (ref 3.87–5.11)
RDW: 15 % (ref 11.5–15.5)
WBC: 5.5 10*3/uL (ref 4.0–10.5)

## 2014-03-26 LAB — BASIC METABOLIC PANEL
ANION GAP: 9 (ref 5–15)
ANION GAP: 7 (ref 5–15)
BUN: 22 mg/dL (ref 6–23)
BUN: 20 mg/dL (ref 6–23)
CALCIUM: 9 mg/dL (ref 8.4–10.5)
CO2: 25 mmol/L (ref 19–32)
CO2: 28 mmol/L (ref 19–32)
Calcium: 9.1 mg/dL (ref 8.4–10.5)
Chloride: 103 mmol/L (ref 96–112)
Chloride: 107 mmol/L (ref 96–112)
Creatinine, Ser: 1.43 mg/dL — ABNORMAL HIGH (ref 0.50–1.10)
Creatinine, Ser: 1.17 mg/dL — ABNORMAL HIGH (ref 0.50–1.10)
GFR calc Af Amer: 36 mL/min — ABNORMAL LOW (ref >90)
GFR, EST AFRICAN AMERICAN: 45 mL/min — AB (ref >90)
GFR, EST NON AFRICAN AMERICAN: 31 mL/min — AB (ref >90)
GFR, EST NON AFRICAN AMERICAN: 39 mL/min — AB (ref >90)
GLUCOSE: 117 mg/dL — AB (ref 70–99)
Glucose, Bld: 115 mg/dL — ABNORMAL HIGH (ref 70–99)
POTASSIUM: 4.2 mmol/L (ref 3.5–5.1)
POTASSIUM: 3.8 mmol/L (ref 3.5–5.1)
SODIUM: 142 mmol/L (ref 135–145)
Sodium: 137 mmol/L (ref 135–145)

## 2014-03-26 LAB — URINALYSIS, ROUTINE W REFLEX MICROSCOPIC
BILIRUBIN URINE: NEGATIVE
GLUCOSE, UA: NEGATIVE mg/dL
HGB URINE DIPSTICK: NEGATIVE
KETONES UR: NEGATIVE mg/dL
Nitrite: NEGATIVE
PH: 6 (ref 5.0–8.0)
Protein, ur: NEGATIVE mg/dL
Specific Gravity, Urine: 1.018 (ref 1.005–1.030)
Urobilinogen, UA: 1 mg/dL (ref 0.0–1.0)

## 2014-03-26 LAB — TROPONIN I

## 2014-03-26 LAB — MRSA PCR SCREENING: MRSA by PCR: NEGATIVE

## 2014-03-26 LAB — URINE MICROSCOPIC-ADD ON

## 2014-03-26 MED ORDER — SODIUM CHLORIDE 0.9 % IJ SOLN
3.0000 mL | Freq: Two times a day (BID) | INTRAMUSCULAR | Status: DC
Start: 2014-03-26 — End: 2014-03-29
  Administered 2014-03-26 – 2014-03-29 (×4): 3 mL via INTRAVENOUS

## 2014-03-26 MED ORDER — FERROUS SULFATE 325 (65 FE) MG PO TABS
325.0000 mg | ORAL_TABLET | Freq: Every day | ORAL | Status: DC
  Administered 2014-03-26 – 2014-03-29 (×4): 325 mg via ORAL
  Filled 2014-03-26 (×5): qty 1

## 2014-03-26 MED ORDER — FAMOTIDINE 20 MG PO TABS
20.0000 mg | ORAL_TABLET | Freq: Every day | ORAL | Status: DC
Start: 2014-03-26 — End: 2014-03-29
  Administered 2014-03-26 – 2014-03-29 (×4): 20 mg via ORAL
  Filled 2014-03-26 (×5): qty 1

## 2014-03-26 MED ORDER — CEFTRIAXONE SODIUM IN DEXTROSE 20 MG/ML IV SOLN
1.0000 g | INTRAVENOUS | Status: DC
  Administered 2014-03-26 – 2014-03-27 (×2): 1 g via INTRAVENOUS
  Filled 2014-03-26 (×3): qty 50

## 2014-03-26 MED ORDER — BUTALBITAL-APAP-CAFFEINE 50-325-40 MG PO TABS: 1.0000 | ORAL_TABLET | ORAL | Status: DC | PRN

## 2014-03-26 MED ORDER — AMLODIPINE BESYLATE 5 MG PO TABS
5.0000 mg | ORAL_TABLET | Freq: Every day | ORAL | Status: DC
Start: 2014-03-26 — End: 2014-03-28
  Administered 2014-03-26 – 2014-03-28 (×3): 5 mg via ORAL
  Filled 2014-03-26 (×4): qty 1

## 2014-03-26 MED ORDER — ONDANSETRON HCL 4 MG/2ML IJ SOLN: 4.0000 mg | Freq: Four times a day (QID) | INTRAMUSCULAR | Status: DC | PRN

## 2014-03-26 MED ORDER — ALBUTEROL SULFATE (2.5 MG/3ML) 0.083% IN NEBU: 3.0000 mL | INHALATION_SOLUTION | RESPIRATORY_TRACT | Status: DC | PRN

## 2014-03-26 MED ORDER — ACETAMINOPHEN 325 MG PO TABS: 650.0000 mg | ORAL_TABLET | Freq: Four times a day (QID) | ORAL | Status: DC | PRN

## 2014-03-26 MED ORDER — ACETAMINOPHEN 650 MG RE SUPP: 650.0000 mg | Freq: Four times a day (QID) | RECTAL | Status: DC | PRN

## 2014-03-26 MED ORDER — ONDANSETRON HCL 4 MG PO TABS: 4.0000 mg | ORAL_TABLET | Freq: Four times a day (QID) | ORAL | Status: DC | PRN

## 2014-03-26 MED ORDER — ALBUTEROL SULFATE (2.5 MG/3ML) 0.083% IN NEBU
2.5000 mg | INHALATION_SOLUTION | Freq: Once | RESPIRATORY_TRACT | Status: AC
  Administered 2014-03-26: 2.5 mg via RESPIRATORY_TRACT
  Filled 2014-03-26: qty 3

## 2014-03-26 MED ORDER — CITALOPRAM HYDROBROMIDE 10 MG PO TABS
10.0000 mg | ORAL_TABLET | Freq: Every day | ORAL | Status: DC
  Administered 2014-03-26 – 2014-03-29 (×4): 10 mg via ORAL
  Filled 2014-03-26 (×5): qty 1

## 2014-03-26 NOTE — ED Notes (Addendum)
Per EMS: Pt from assisted living facility. Pt had unwitnessed fall, hit back L side of head. Pt denies any neck or back pain. Pt not on blood thinners. A&O per norm according to nursing home staff. Pt denies LOC, denies dizziness, no SOB.

## 2014-03-26 NOTE — Progress Notes (Signed)
Clinical Social Work Department BRIEF PSYCHOSOCIAL ASSESSMENT 03/26/2014  Patient:  Michelle [name], Michelle [name]     Account Number:  [id number]     Admit date:  03/26/2014  Clinical Social Worker:  Glorious Peach, CLINICAL SOCIAL WORKER  Date/Time:  03/26/2014 01:03 PM  Referred by:  Physician  Date Referred:  03/26/2014 Referred for  SNF Placement   Other Referral:   Interview type:  Family Other interview type:    PSYCHOSOCIAL DATA Living Status:  FACILITY Admitted from facility:  [facility] Level of care:  Assisted Living Primary support name:  Eulis Canner Primary support relationship to patient:  CHILD, ADULT Degree of support available:   Adequate    CURRENT CONCERNS Current Concerns  Post-Acute Placement   Other Concerns:    SOCIAL WORK ASSESSMENT / PLAN CSW received consult from PT stating that patient needs short term rehab and will benefit from a SNF. CSW completed FL2 and faxed patient out to [facility], awaiting bed offers. CSW went to speak with patient who reports of being really tired at the time and requests that we called her daughter, Junita Push. CSW contacted daughter to update her with new information on DC planning.   Assessment/plan status:  Information/Referral to [employer] Other assessment/ plan:   Information/referral to community resources:   CSW to fax out FL2 to [facility] and make family aware of bed offers once CSW receive them.    PATIENT'S/FAMILY'S RESPONSE TO PLAN OF CARE: CSW met with patient at bedside, introduced self and explained role. Patient reports of being very tired and requests that CSW speaks with daughter, Junita Push. Patient caregiver, Suanne Marker at bedside and took notes with updated information. CSW also contacted [facility] where patient was before being admitted to the hospital. Nurse Tech at [facility], Erline Levine reports that patient requires one assist when patient wants to get around throughout the  facility, and 2 people to assist with getting up out of the bed or to lift. CSW reported that information to PT who states that patient would not get out of bed for them. PT recommends SNF for patient to gain strength back before returning to ALF. CSW called daughter Junita Push and notified her with PT recommendation of SNF for short term rehab. Daughter is agreeable and states that she knows that patient would have to be at the hospital for 3 days before insurance covers SNF costs and states that is the only way patient could go to SNF because they cannot pay privately. CSW informed daughter that DC planning starts as soon as patient is admitted and we are unaware when patient will DC, but will keep family posted. CSW faxed out FL2 to [facility] and is awaiting bed offers at this time. CSW is continuing to follow.       Glorious Peach BSW Intern

## 2014-03-26 NOTE — Progress Notes (Addendum)
Progress Note   Michelle [name]:[id number] DOB: [birth year] DOA: 03/26/2014 PCP: Bufford Spikes, DO   Brief Narrative:   Michelle [name] is an 81 y.o. female with a PMH of hypertension, GERD, possible PMR, depression, dementia, breast cancer and chronic kidney disease who was admitted on 03/26/14 after suffering from a fall where she fell out of bed and hit the ground with resultant subdural hematoma.  Assessment/Plan:   Principal Problem:   SDH (subdural hematoma) secondary to fall - CT of the head showed both acute and subacute subdural hematoma. - The neurosurgeon on call reviewed her films and recommended no surgical intervention. - Being followed with serial neuro checks. - Repeat CT scan if any deterioration of neurological exam. - Hold aspirin. - PT/OT consultations requested in light of frequent falls at home.  Active Problems:   Acute kidney injury/Stage III CKD - Monitor.  Baseline creatinine is around 1.1-1.4.    UTI (lower urinary tract infection) - On empiric Rocephin. Follow-up urine culture results.    Alzheimer's dementia - Stable.    Essential hypertension, benign - Continue Norvasc.    DVT Prophylaxis - SCDS.  Code Status: Full. Family Communication: Caregiver [name] updated at bedside.  [name] (daughter updated by telephone). Disposition Plan: From [facility], may need SNF at d/c.   IV Access:    Peripheral IV   Procedures and diagnostic studies:   Dg Pelvis 1-2 Views  03/26/2014   CLINICAL DATA:  Bilateral hip pain after fall.  EXAM: PELVIS - 1-2 VIEW  COMPARISON:  None.  FINDINGS: The cortical margins of the bony pelvis are intact. No fracture. Pubic symphysis and sacroiliac joints are congruent. Both femoral heads are well-seated in the respective acetabula. Mild degenerative change of both hips and sacroiliac joints.  IMPRESSION: No pelvic fracture.   Electronically Signed   By: Rubye Oaks M.D.   On: 03/26/2014 03:43    Ct Head Wo Contrast  03/26/2014   CLINICAL DATA:  81 year old female post unwitnessed fall.  EXAM: CT HEAD WITHOUT CONTRAST  CT CERVICAL SPINE WITHOUT CONTRAST  TECHNIQUE: Multidetector CT imaging of the head and cervical spine was performed following the standard protocol without intravenous contrast. Multiplanar CT image reconstructions of the cervical spine were also generated.  COMPARISON:  Head CT 01/18/2014.  Cervical spine CT 05/02/2013  FINDINGS: CT HEAD FINDINGS  Right subdural hematoma along the highest convexity with acute component measuring 9 mm in the parietal region. Probable subacute component anteriorly is more isodense measuring up to 7 mm. There is no significant mass effect or midline shift. Generalized atrophy and chronic small vessel ischemic change, similar to prior. No calvarial fracture. Postsurgical change in the right paranasal sinuses. There is a left maxillary sinus mucous retention cyst. The mastoid air cells are well aerated.  CT CERVICAL SPINE FINDINGS  No acute fracture or subluxation. The dens is intact. Disc space narrowing with endplate spurring at C4-C5, unchanged from prior exam. Additional small endplate spurs throughout. Multilevel facet arthropathy, also unchanged. There are no jumped or perched facets. No prevertebral soft tissue edema.  IMPRESSION: 1. Right subdural hematoma along the high convexity, with both acute and subacute components. Maximal thickness 9 mm. No significant associated mass effect or midline shift. 2. Stable degenerative change in the cervical spine without acute fracture. Critical Value/emergent results were called by telephone at the time of interpretation on 03/26/2014 at 3:41 am to Dr. Derwood Kaplan , who verbally acknowledged these results.   Electronically  Signed   By: Rubye Oaks M.D.   On: 03/26/2014 03:42   Ct Cervical Spine Wo Contrast  03/26/2014   CLINICAL DATA:  81 year old female post unwitnessed fall.  EXAM: CT HEAD WITHOUT  CONTRAST  CT CERVICAL SPINE WITHOUT CONTRAST  TECHNIQUE: Multidetector CT imaging of the head and cervical spine was performed following the standard protocol without intravenous contrast. Multiplanar CT image reconstructions of the cervical spine were also generated.  COMPARISON:  Head CT 01/18/2014.  Cervical spine CT 05/02/2013  FINDINGS: CT HEAD FINDINGS  Right subdural hematoma along the highest convexity with acute component measuring 9 mm in the parietal region. Probable subacute component anteriorly is more isodense measuring up to 7 mm. There is no significant mass effect or midline shift. Generalized atrophy and chronic small vessel ischemic change, similar to prior. No calvarial fracture. Postsurgical change in the right paranasal sinuses. There is a left maxillary sinus mucous retention cyst. The mastoid air cells are well aerated.  CT CERVICAL SPINE FINDINGS  No acute fracture or subluxation. The dens is intact. Disc space narrowing with endplate spurring at C4-C5, unchanged from prior exam. Additional small endplate spurs throughout. Multilevel facet arthropathy, also unchanged. There are no jumped or perched facets. No prevertebral soft tissue edema.  IMPRESSION: 1. Right subdural hematoma along the high convexity, with both acute and subacute components. Maximal thickness 9 mm. No significant associated mass effect or midline shift. 2. Stable degenerative change in the cervical spine without acute fracture. Critical Value/emergent results were called by telephone at the time of interpretation on 03/26/2014 at 3:41 am to Dr. Derwood Kaplan , who verbally acknowledged these results.   Electronically Signed   By: Rubye Oaks M.D.   On: 03/26/2014 03:42     Medical Consultants:    None.  Anti-Infectives:    Rocephin 03/26/14--->  Subjective:   Michelle [name] denies headache, nausea, pain.  She has been up with PT this morning.  Generalized weakness noted.  Daughter notes she is more  confused than usual.  Objective:    Filed Vitals:   03/26/14 0300 03/26/14 0404 03/26/14 0634 03/26/14 0819  BP: 151/69 160/68 162/83   Pulse: 57 61 62   Temp:   98.1 F (36.7 C)   TempSrc:   Oral   Resp: 16 14    Height:   5\' 5"  (1.651 m)   Weight:   83.7 kg (184 lb 8.4 oz)   SpO2: 94% 99% 100% 98%   No intake or output data in the 24 hours ending 03/26/14 0834  Exam: Gen:  NAD Cardiovascular:  RRR, II/VI SEM Respiratory:  Lungs CTAB Gastrointestinal:  Abdomen soft, NT/ND, + BS Neuro: Non-focal, generalized weakness, confused Extremities:  No C/E/C, SCDs on   Data Reviewed:    Labs: Basic Metabolic Panel:  Recent Labs Lab 03/26/14 0357  NA 137  K 4.2  CL 103  CO2 25  GLUCOSE 117*  BUN 22  CREATININE 1.43*  CALCIUM 9.0   GFR Estimated Creatinine Clearance: 26.8 mL/min (by C-G formula based on Cr of 1.43).  CBC:  Recent Labs Lab 03/26/14 0357  WBC 5.5  NEUTROABS 3.9  HGB 10.1*  HCT 33.8*  MCV 98.8  PLT 185   Cardiac Enzymes:  Recent Labs Lab 03/26/14 0357  TROPONINI <0.03   Microbiology No results found for this or any previous visit (from the past 240 hour(s)).   Medications:   . amLODipine  5 mg Oral Daily  . cefTRIAXone (ROCEPHIN)  IV  1 g Intravenous Q24H  . citalopram  10 mg Oral Daily  . famotidine  20 mg Oral Daily  . ferrous sulfate  325 mg Oral Q breakfast  . sodium chloride  3 mL Intravenous Q12H   Continuous Infusions:   Time spent: 35 minutes with > 50% of time discussing current diagnostic test results, clinical impression and plan of care with patient's daughter.    LOS: 0 days   Zuri Lascala  [facility] Pager [phone]. If unable to reach me by pager, please call my cell phone at [phone].  *Please refer to [email or web address], password [email or web address] to get updated schedule on who will round on this patient, as hospitalists switch teams weekly. If 7PM-7AM, please contact night-coverage at [email or web address], password [email or web address] for  any overnight needs.  03/26/2014, 8:34 AM

## 2014-03-26 NOTE — ED Notes (Signed)
MD at bedside. 

## 2014-03-26 NOTE — ED Provider Notes (Signed)
CSN: [id number]     Arrival date & time 03/26/14  0152 History   First MD Initiated Contact with Patient 03/26/14 0203     Chief Complaint  Patient presents with  . Fall     (Consider location/radiation/quality/duration/timing/severity/associated sxs/prior Treatment) HPI Comments: Pt comes in with cc of fall. Pt is 81 y/o and not on any anticoagulants, + baby aspirin use who comes from a nursing home after having a fall. Pt is alert and oriented to self and location. She states that she tripped and fell, and hurts in her head and her back. No nausea, vomiting, visual complains, seizures, altered mental status, loss of consciousness, new weakness, or numbness, no gait instability. Pt has no neck pain. Pt's back pain is in the pelvis area.   ROS 10 Systems reviewed and are negative for acute change except as noted in the HPI.     Patient is a 81 y.o. female presenting with fall. The history is provided by the patient and a relative.  Fall Associated symptoms include headaches. Pertinent negatives include no chest pain, no abdominal pain and no shortness of breath.    Past Medical History  Diagnosis Date  . Hypertension   . Hyperlipidemia LDL goal < 100   . Polymyalgia rheumatica   . GERD (gastroesophageal reflux disease)   . Irritable bowel syndrome   . Diverticulitis   . Sciatica   . Vertigo   . Edema of leg   . Glaucoma   . Appendicitis   . Arthritis   . Acute upper respiratory infections of unspecified site   . Urge incontinence   . Other specified symptom associated with female genital organs   . Regional enteritis of unspecified site   . Sebaceous cyst   . Depression   . Other malaise and fatigue   . Abnormal weight gain   . Leukocytopenia, unspecified   . Dementia in conditions classified elsewhere without behavioral disturbance   . Anemia, unspecified   . Chronic kidney disease, stage III (moderate)   . Unspecified arthropathy, shoulder region   . Other  B-complex deficiencies   . Unspecified vitamin D deficiency   . Memory loss   . Abnormality of gait   . Personal history of fall   . Malignant neoplasm of breast (female), unspecified site   . Unspecified glaucoma   . Diverticulosis of colon (without mention of hemorrhage)   . Irritable bowel syndrome   . Sciatica   . Dizziness and giddiness    Past Surgical History  Procedure Laterality Date  . Nasal sinus surgery  1966  . Cholecystectomy  1972  . Cataract extraction  1990  . Breast surgery      R breast lumpectomy for benign disease  . Breast lumpectomy  10/28/10    left breast    Family History  Problem Relation Age of Onset  . Stroke Father   . Hypertension Mother   . Stroke Mother   . Breast cancer Mother   . Heart attack Brother   . Cancer Brother   . Stroke Brother   . Heart attack Son   . Colon cancer Sister    History  Substance Use Topics  . Smoking status: Never Smoker   . Smokeless tobacco: Never Used  . Alcohol Use: No   OB History    No data available     Review of Systems  Constitutional: Positive for activity change.  Respiratory: Negative for shortness of breath.   Cardiovascular:  Negative for chest pain.  Gastrointestinal: Negative for nausea, vomiting and abdominal pain.  Genitourinary: Negative for dysuria.  Musculoskeletal: Positive for back pain. Negative for neck pain.  Neurological: Positive for headaches.  All other systems reviewed and are negative.     Allergies  Codeine  Home Medications   Prior to Admission medications   Medication Sig Start Date End Date Taking? Authorizing Provider  acetaminophen (TYLENOL) 500 MG tablet Take 500 mg by mouth every 6 (six) hours as needed for mild pain or headache.   Yes Historical Provider, MD  albuterol (PROVENTIL HFA;VENTOLIN HFA) 108 (90 BASE) MCG/ACT inhaler 2 puffs TID x 7 days the BID x 7 days the daily x 7 days and stop 02/13/14  Yes Monica [employer], DO  amLODipine  (NORVASC) 5 MG tablet Take 1 tablet (5 mg total) by mouth daily. 02/13/14  Yes Estill Dooms, MD  aspirin 81 MG chewable tablet Chew 81 mg by mouth daily.    Yes Historical Provider, MD  benzonatate (TESSALON PERLES) 100 MG capsule Take 1 capsule (100 mg total) by mouth 3 (three) times daily as needed for cough. 02/13/14  Yes Monica Shamsid-Deen Carter, DO  bimatoprost (LUMIGAN) 0.01 % SOLN Place 1 drop into both eyes at bedtime.    Yes Historical Provider, MD  brimonidine-timolol (COMBIGAN) 0.2-0.5 % ophthalmic solution Place 1 drop into both eyes 2 (two) times daily.    Yes Historical Provider, MD  calcium carbonate 200 MG capsule Take 200 mg by mouth daily.    Yes Historical Provider, MD  citalopram (CELEXA) 10 MG tablet TAKE 1 TABLET ONCE DAILY. 10/17/13  Yes Tiffany L Reed, DO  dorzolamide (TRUSOPT) 2 % ophthalmic solution Place 1 drop into both eyes 2 (two) times daily. 02/13/14  Yes Estill Dooms, MD  ferrous sulfate 325 (65 FE) MG tablet Take 325 mg by mouth daily with breakfast.   Yes Historical Provider, MD  guaiFENesin (ROBITUSSIN) 100 MG/5ML liquid Take 600 mg by mouth 3 (three) times daily as needed for cough.   Yes Historical Provider, MD  lidocaine (LIDODERM) 5 % APPLY 1 PATCH TO EACH SHOULDER , KEEP ON 12 HOURS, THEN KEEP OFF FOR 12 HOURS. 02/13/14  Yes Estill Dooms, MD  mineral oil liquid Take 1 mL by mouth daily as needed for moderate constipation. To right ear   Yes Historical Provider, MD  Multiple Vitamin (MULTIVITAMIN WITH MINERALS) TABS Take 1 tablet by mouth daily.   Yes Historical Provider, MD  ranitidine (ZANTAC) 150 MG tablet Take 150 mg by mouth every morning.   Yes Historical Provider, MD  traMADol (ULTRAM) 50 MG tablet TAKE 1 TABLET 3 TIMES DAILY AS NEEDED FOR PAIN. 01/13/14  Yes Tiffany L Reed, DO  albuterol (PROVENTIL) (2.5 MG/3ML) 0.083% nebulizer solution Take 3 mLs (2.5 mg total) by nebulization once. 02/13/14   Monica Viviano Simas, DO  azithromycin (ZITHROMAX)  250 MG tablet Take 2 tabs po on day 1 then 1 tab po on days 2-5 02/13/14   [facility], DO  predniSONE (DELTASONE) 10 MG tablet Take 4 tabs po daily x 3 days then 3 tabs po daily x 3 days then 2 tabs po daily x 3 days then 1 tab po daily x 3 days and stop 02/13/14   [facility], DO   BP 160/68 mmHg  Pulse 61  Temp(Src) 98.3 F (36.8 C) (Oral)  Resp 14  SpO2 99% Physical Exam  Constitutional: She is oriented to person, place,  and time. She appears well-developed.  HENT:  Head: Normocephalic and atraumatic.  Eyes: Conjunctivae and EOM are normal. Pupils are equal, round, and reactive to light.  Neck: Normal range of motion. Neck supple.  Cardiovascular: Normal rate, regular rhythm and normal heart sounds.   Pulmonary/Chest: Effort normal and breath sounds normal. No respiratory distress.  Abdominal: Soft. Bowel sounds are normal. She exhibits no distension. There is no tenderness. There is no rebound and no guarding.  Musculoskeletal:  Posterior scalp hematoma OTHERWISE:  Head to toe evaluation shows no hematoma, bleeding of the scalp, no facial abrasions, step offs, crepitus, no tenderness to palpation of the bilateral upper and lower extremities, no gross deformities, no chest tenderness, no pelvic pain.   Neurological: She is alert and oriented to person, place, and time. No cranial nerve deficit.  Skin: Skin is warm and dry.  Nursing note and vitals reviewed.   ED Course  Procedures (including critical care time) Labs Review Labs Reviewed  CBC WITH DIFFERENTIAL/PLATELET - Abnormal; Notable for the following:    RBC 3.42 (*)    Hemoglobin 10.1 (*)    HCT 33.8 (*)    MCHC 29.9 (*)    All other components within normal limits  BASIC METABOLIC PANEL - Abnormal; Notable for the following:    Glucose, Bld 117 (*)    Creatinine, Ser 1.43 (*)    GFR calc non Af Amer 31 (*)    GFR calc Af Amer 36 (*)    All other components within normal limits   TROPONIN I  URINALYSIS, ROUTINE W REFLEX MICROSCOPIC    Imaging Review Dg Pelvis 1-2 Views  03/26/2014   CLINICAL DATA:  Bilateral hip pain after fall.  EXAM: PELVIS - 1-2 VIEW  COMPARISON:  None.  FINDINGS: The cortical margins of the bony pelvis are intact. No fracture. Pubic symphysis and sacroiliac joints are congruent. Both femoral heads are well-seated in the respective acetabula. Mild degenerative change of both hips and sacroiliac joints.  IMPRESSION: No pelvic fracture.   Electronically Signed   By: Jeb Levering M.D.   On: 03/26/2014 03:43   Ct Head Wo Contrast  03/26/2014   CLINICAL DATA:  81 year old female post unwitnessed fall.  EXAM: CT HEAD WITHOUT CONTRAST  CT CERVICAL SPINE WITHOUT CONTRAST  TECHNIQUE: Multidetector CT imaging of the head and cervical spine was performed following the standard protocol without intravenous contrast. Multiplanar CT image reconstructions of the cervical spine were also generated.  COMPARISON:  Head CT 01/18/2014.  Cervical spine CT 05/02/2013  FINDINGS: CT HEAD FINDINGS  Right subdural hematoma along the highest convexity with acute component measuring 9 mm in the parietal region. Probable subacute component anteriorly is more isodense measuring up to 7 mm. There is no significant mass effect or midline shift. Generalized atrophy and chronic small vessel ischemic change, similar to prior. No calvarial fracture. Postsurgical change in the right paranasal sinuses. There is a left maxillary sinus mucous retention cyst. The mastoid air cells are well aerated.  CT CERVICAL SPINE FINDINGS  No acute fracture or subluxation. The dens is intact. Disc space narrowing with endplate spurring at A3-F5, unchanged from prior exam. Additional small endplate spurs throughout. Multilevel facet arthropathy, also unchanged. There are no jumped or perched facets. No prevertebral soft tissue edema.  IMPRESSION: 1. Right subdural hematoma along the high convexity, with both  acute and subacute components. Maximal thickness 9 mm. No significant associated mass effect or midline shift. 2. Stable degenerative change in the  cervical spine without acute fracture. Critical Value/emergent results were called by telephone at the time of interpretation on 03/26/2014 at 3:41 am to Dr. Varney Biles , who verbally acknowledged these results.   Electronically Signed   By: Jeb Levering M.D.   On: 03/26/2014 03:42   Ct Cervical Spine Wo Contrast  03/26/2014   CLINICAL DATA:  81 year old female post unwitnessed fall.  EXAM: CT HEAD WITHOUT CONTRAST  CT CERVICAL SPINE WITHOUT CONTRAST  TECHNIQUE: Multidetector CT imaging of the head and cervical spine was performed following the standard protocol without intravenous contrast. Multiplanar CT image reconstructions of the cervical spine were also generated.  COMPARISON:  Head CT 01/18/2014.  Cervical spine CT 05/02/2013  FINDINGS: CT HEAD FINDINGS  Right subdural hematoma along the highest convexity with acute component measuring 9 mm in the parietal region. Probable subacute component anteriorly is more isodense measuring up to 7 mm. There is no significant mass effect or midline shift. Generalized atrophy and chronic small vessel ischemic change, similar to prior. No calvarial fracture. Postsurgical change in the right paranasal sinuses. There is a left maxillary sinus mucous retention cyst. The mastoid air cells are well aerated.  CT CERVICAL SPINE FINDINGS  No acute fracture or subluxation. The dens is intact. Disc space narrowing with endplate spurring at J0-K9, unchanged from prior exam. Additional small endplate spurs throughout. Multilevel facet arthropathy, also unchanged. There are no jumped or perched facets. No prevertebral soft tissue edema.  IMPRESSION: 1. Right subdural hematoma along the high convexity, with both acute and subacute components. Maximal thickness 9 mm. No significant associated mass effect or midline shift. 2. Stable  degenerative change in the cervical spine without acute fracture. Critical Value/emergent results were called by telephone at the time of interpretation on 03/26/2014 at 3:41 am to Dr. Varney Biles , who verbally acknowledged these results.   Electronically Signed   By: Jeb Levering M.D.   On: 03/26/2014 03:42     EKG Interpretation   Date/Time:  Thursday March 26 2014 03:49:16 EST Ventricular Rate:  62 PR Interval:  191 QRS Duration: 98 QT Interval:  465 QTC Calculation: 472 R Axis:   5 Text Interpretation:  Sinus rhythm Abnormal R-wave progression, early  transition ST elevation, consider inferior injury Baseline wander in  lead(s) I aVL V6 No significant change since last tracing Confirmed by  Carlee Tesfaye, MD, Thelma Comp [phone]) on 03/26/2014 4:15:03 AM      MDM   Final diagnoses:  Fall  Traumatic subdural hematoma, without loss of consciousness, initial encounter    DDx includes: - Mechanical falls - ICH - Fractures - Contusions - Soft tissue injury  Pt comes in with cc of fall. CT head and spine ordered - head is showing acute on chronic, small subdural hematoma.  Neurovascularly intact. Spoke with Dr. Ronnald Ramp, Neurosurgery. He reviewed the images and recommended: - No neurosurgical intervention is required.  - He will not be seeing the patient unless requested by Hospitalist - Repeat Ct is not mandated  - Admit to Hospitalist - No need to transfer at [facility].  Dr. Posey Pronto to admit. Family informed of the findings. Currently she is FULL CODE  CRITICAL CARE Performed by: Varney Biles   Total critical care time: 50 minutes  Critical care time was exclusive of separately billable procedures and treating other patients.  Critical care was necessary to treat or prevent imminent or life-threatening deterioration.  Critical care was time spent personally by me on the following activities: development of  treatment plan with patient and/or surrogate as well as  nursing, discussions with consultants, evaluation of patient's response to treatment, examination of patient, obtaining history from patient or surrogate, ordering and performing treatments and interventions, ordering and review of laboratory studies, ordering and review of radiographic studies, pulse oximetry and re-evaluation of patient's condition.     Varney Biles, MD 03/26/14 [phone]

## 2014-03-26 NOTE — H&P (Addendum)
[facility] History and Physical  Patient: Michelle [name]  MRN: [id number]  DOB: 04/21/1920  DOS: the patient was seen and examined on 03/26/2014 PCP: Hollace Kinnier, DO  Chief Complaint: Fall  HPI: Michelle [name] is a 81 y.o. female with Past medical history of hypertension, GERD, questionable history of PMR, depression, dementia, chronic kidney disease, breast cancer. The patient is presenting with unwitnessed fall. Patient mentions that when she woke up from her sleep she slid out from her bed and hit the ground sitting on her buttock. She denies any head injury or any neck injury. She initially presented with complaints of headache but currently does not have any headache. She denies any chest pain or abdominal pain diarrhea or constipation. Denies any burning urination. Denies any nausea or vomiting. Denies any change in her medications. She is on assisted living facility. And at her baseline independent for most of her ADL.  Review of Systems: as mentioned in the history of present illness.  A Comprehensive review of the other systems is negative.  Past Medical History  Diagnosis Date  . Hypertension   . Hyperlipidemia LDL goal < 100   . Polymyalgia rheumatica   . GERD (gastroesophageal reflux disease)   . Irritable bowel syndrome   . Diverticulitis   . Sciatica   . Vertigo   . Edema of leg   . Glaucoma   . Appendicitis   . Arthritis   . Acute upper respiratory infections of unspecified site   . Urge incontinence   . Other specified symptom associated with female genital organs   . Regional enteritis of unspecified site   . Sebaceous cyst   . Depression   . Other malaise and fatigue   . Abnormal weight gain   . Leukocytopenia, unspecified   . Dementia in conditions classified elsewhere without behavioral disturbance   . Anemia, unspecified   . Chronic kidney disease, stage III (moderate)   . Unspecified arthropathy, shoulder region   . Other  B-complex deficiencies   . Unspecified vitamin D deficiency   . Memory loss   . Abnormality of gait   . Personal history of fall   . Malignant neoplasm of breast (female), unspecified site   . Unspecified glaucoma   . Diverticulosis of colon (without mention of hemorrhage)   . Irritable bowel syndrome   . Sciatica   . Dizziness and giddiness    Past Surgical History  Procedure Laterality Date  . Nasal sinus surgery  1966  . Cholecystectomy  1972  . Cataract extraction  1990  . Breast surgery      R breast lumpectomy for benign disease  . Breast lumpectomy  10/28/10    left breast    Social History:  reports that she has never smoked. She has never used smokeless tobacco. She reports that she does not drink alcohol or use illicit drugs.  Allergies  Allergen Reactions  . Codeine     States she blacks out. Listed on MAR    Family History  Problem Relation Age of Onset  . Stroke Father   . Hypertension Mother   . Stroke Mother   . Breast cancer Mother   . Heart attack Brother   . Cancer Brother   . Stroke Brother   . Heart attack Son   . Colon cancer Sister     Prior to Admission medications   Medication Sig Start Date End Date Taking? Authorizing Provider  acetaminophen (TYLENOL) 500 MG tablet Take 500 mg  by mouth every 6 (six) hours as needed for mild pain or headache.   Yes Historical Provider, MD  albuterol (PROVENTIL HFA;VENTOLIN HFA) 108 (90 BASE) MCG/ACT inhaler 2 puffs TID x 7 days the BID x 7 days the daily x 7 days and stop 02/13/14  Yes Monica [employer], DO  amLODipine (NORVASC) 5 MG tablet Take 1 tablet (5 mg total) by mouth daily. 02/13/14  Yes Estill Dooms, MD  aspirin 81 MG chewable tablet Chew 81 mg by mouth daily.    Yes Historical Provider, MD  benzonatate (TESSALON PERLES) 100 MG capsule Take 1 capsule (100 mg total) by mouth 3 (three) times daily as needed for cough. 02/13/14  Yes Monica Shamsid-Deen Carter, DO  bimatoprost (LUMIGAN) 0.01 %  SOLN Place 1 drop into both eyes at bedtime.    Yes Historical Provider, MD  brimonidine-timolol (COMBIGAN) 0.2-0.5 % ophthalmic solution Place 1 drop into both eyes 2 (two) times daily.    Yes Historical Provider, MD  calcium carbonate 200 MG capsule Take 200 mg by mouth daily.    Yes Historical Provider, MD  citalopram (CELEXA) 10 MG tablet TAKE 1 TABLET ONCE DAILY. 10/17/13  Yes Tiffany L Reed, DO  dorzolamide (TRUSOPT) 2 % ophthalmic solution Place 1 drop into both eyes 2 (two) times daily. 02/13/14  Yes Estill Dooms, MD  ferrous sulfate 325 (65 FE) MG tablet Take 325 mg by mouth daily with breakfast.   Yes Historical Provider, MD  guaiFENesin (ROBITUSSIN) 100 MG/5ML liquid Take 600 mg by mouth 3 (three) times daily as needed for cough.   Yes Historical Provider, MD  lidocaine (LIDODERM) 5 % APPLY 1 PATCH TO EACH SHOULDER , KEEP ON 12 HOURS, THEN KEEP OFF FOR 12 HOURS. 02/13/14  Yes Estill Dooms, MD  mineral oil liquid Take 1 mL by mouth daily as needed for moderate constipation. To right ear   Yes Historical Provider, MD  Multiple Vitamin (MULTIVITAMIN WITH MINERALS) TABS Take 1 tablet by mouth daily.   Yes Historical Provider, MD  ranitidine (ZANTAC) 150 MG tablet Take 150 mg by mouth every morning.   Yes Historical Provider, MD  traMADol (ULTRAM) 50 MG tablet TAKE 1 TABLET 3 TIMES DAILY AS NEEDED FOR PAIN. 01/13/14  Yes Tiffany L Reed, DO  albuterol (PROVENTIL) (2.5 MG/3ML) 0.083% nebulizer solution Take 3 mLs (2.5 mg total) by nebulization once. 02/13/14   Monica Viviano Simas, DO  azithromycin (ZITHROMAX) 250 MG tablet Take 2 tabs po on day 1 then 1 tab po on days 2-5 02/13/14   [facility] Eulas Post, DO  predniSONE (DELTASONE) 10 MG tablet Take 4 tabs po daily x 3 days then 3 tabs po daily x 3 days then 2 tabs po daily x 3 days then 1 tab po daily x 3 days and stop 02/13/14   Shea Evans, DO    Physical Exam: Filed Vitals:   03/26/14 0157 03/26/14 0300 03/26/14  0404 03/26/14 0634  BP: 127/71 151/69 160/68 162/83  Pulse: 64 57 61 62  Temp: 98.3 F (36.8 C)   98.1 F (36.7 C)  TempSrc: Oral   Oral  Resp: 16 16 14    Height:    5\' 5"  (1.651 m)  Weight:    83.7 kg (184 lb 8.4 oz)  SpO2: 95% 94% 99% 100%    General: Alert, Awake and Oriented to Time, Place and Person. Appear in mild distress Eyes: PERRL ENT: Oral Mucosa clear moist. Neck: Difficult to assess JVD  Cardiovascular: S1 and S2 Present, aortic systolic Murmur, Peripheral Pulses Present Respiratory: Bilateral Air entry equal and Decreased, Clear to Auscultation, no Crackles, no wheezes Abdomen: Bowel Sound present, Soft and non tender Skin: no Rash Extremities: trace Pedal edema, no calf tenderness Neurologic: Grossly no focal neuro deficit.  Labs on Admission:  CBC:  Recent Labs Lab 03/26/14 0357  WBC 5.5  NEUTROABS 3.9  HGB 10.1*  HCT 33.8*  MCV 98.8  PLT 185    CMP     Component Value Date/Time   NA 137 03/26/2014 0357   NA 142 11/10/2013 1153   NA 142 02/17/2013 1215   K 4.2 03/26/2014 0357   K 4.7 11/10/2013 1153   CL 103 03/26/2014 0357   CL 109* 01/04/2012 1144   CO2 25 03/26/2014 0357   CO2 26 11/10/2013 1153   GLUCOSE 117* 03/26/2014 0357   GLUCOSE 94 11/10/2013 1153   GLUCOSE 96 02/17/2013 1215   GLUCOSE 88 01/04/2012 1144   BUN 22 03/26/2014 0357   BUN 24.1 11/10/2013 1153   BUN 19 02/17/2013 1215   CREATININE 1.43* 03/26/2014 0357   CREATININE 1.2* 11/10/2013 1153   CALCIUM 9.0 03/26/2014 0357   CALCIUM 9.6 11/10/2013 1153   PROT 7.7 02/06/2014 1750   PROT 6.9 11/10/2013 1153   PROT 6.6 10/28/2012 1602   ALBUMIN 3.7 02/06/2014 1750   ALBUMIN 3.3* 11/10/2013 1153   AST 20 02/06/2014 1750   AST 17 11/10/2013 1153   ALT 15 02/06/2014 1750   ALT 13 11/10/2013 1153   ALKPHOS 62 02/06/2014 1750   ALKPHOS 66 11/10/2013 1153   BILITOT 1.1 02/06/2014 1750   BILITOT 0.63 11/10/2013 1153   GFRNONAA 31* 03/26/2014 0357   GFRAA 36* 03/26/2014 0357     No results for input(s): LIPASE, AMYLASE in the last 168 hours.   Recent Labs Lab 03/26/14 0357  TROPONINI <0.03   BNP (last 3 results) No results for input(s): BNP in the last 8760 hours.  ProBNP (last 3 results) No results for input(s): PROBNP in the last 8760 hours.   Radiological Exams on Admission: Dg Pelvis 1-2 Views  03/26/2014   CLINICAL DATA:  Bilateral hip pain after fall.  EXAM: PELVIS - 1-2 VIEW  COMPARISON:  None.  FINDINGS: The cortical margins of the bony pelvis are intact. No fracture. Pubic symphysis and sacroiliac joints are congruent. Both femoral heads are well-seated in the respective acetabula. Mild degenerative change of both hips and sacroiliac joints.  IMPRESSION: No pelvic fracture.   Electronically Signed   By: Jeb Levering M.D.   On: 03/26/2014 03:43   Ct Head Wo Contrast  03/26/2014   CLINICAL DATA:  81 year old female post unwitnessed fall.  EXAM: CT HEAD WITHOUT CONTRAST  CT CERVICAL SPINE WITHOUT CONTRAST  TECHNIQUE: Multidetector CT imaging of the head and cervical spine was performed following the standard protocol without intravenous contrast. Multiplanar CT image reconstructions of the cervical spine were also generated.  COMPARISON:  Head CT 01/18/2014.  Cervical spine CT 05/02/2013  FINDINGS: CT HEAD FINDINGS  Right subdural hematoma along the highest convexity with acute component measuring 9 mm in the parietal region. Probable subacute component anteriorly is more isodense measuring up to 7 mm. There is no significant mass effect or midline shift. Generalized atrophy and chronic small vessel ischemic change, similar to prior. No calvarial fracture. Postsurgical change in the right paranasal sinuses. There is a left maxillary sinus mucous retention cyst. The mastoid air cells are well aerated.  CT CERVICAL SPINE FINDINGS  No acute fracture or subluxation. The dens is intact. Disc space narrowing with endplate spurring at Y4-I3, unchanged from prior  exam. Additional small endplate spurs throughout. Multilevel facet arthropathy, also unchanged. There are no jumped or perched facets. No prevertebral soft tissue edema.  IMPRESSION: 1. Right subdural hematoma along the high convexity, with both acute and subacute components. Maximal thickness 9 mm. No significant associated mass effect or midline shift. 2. Stable degenerative change in the cervical spine without acute fracture. Critical Value/emergent results were called by telephone at the time of interpretation on 03/26/2014 at 3:41 am to Dr. Varney Biles , who verbally acknowledged these results.   Electronically Signed   By: Jeb Levering M.D.   On: 03/26/2014 03:42   Ct Cervical Spine Wo Contrast  03/26/2014   CLINICAL DATA:  81 year old female post unwitnessed fall.  EXAM: CT HEAD WITHOUT CONTRAST  CT CERVICAL SPINE WITHOUT CONTRAST  TECHNIQUE: Multidetector CT imaging of the head and cervical spine was performed following the standard protocol without intravenous contrast. Multiplanar CT image reconstructions of the cervical spine were also generated.  COMPARISON:  Head CT 01/18/2014.  Cervical spine CT 05/02/2013  FINDINGS: CT HEAD FINDINGS  Right subdural hematoma along the highest convexity with acute component measuring 9 mm in the parietal region. Probable subacute component anteriorly is more isodense measuring up to 7 mm. There is no significant mass effect or midline shift. Generalized atrophy and chronic small vessel ischemic change, similar to prior. No calvarial fracture. Postsurgical change in the right paranasal sinuses. There is a left maxillary sinus mucous retention cyst. The mastoid air cells are well aerated.  CT CERVICAL SPINE FINDINGS  No acute fracture or subluxation. The dens is intact. Disc space narrowing with endplate spurring at K7-Q2, unchanged from prior exam. Additional small endplate spurs throughout. Multilevel facet arthropathy, also unchanged. There are no jumped or  perched facets. No prevertebral soft tissue edema.  IMPRESSION: 1. Right subdural hematoma along the high convexity, with both acute and subacute components. Maximal thickness 9 mm. No significant associated mass effect or midline shift. 2. Stable degenerative change in the cervical spine without acute fracture. Critical Value/emergent results were called by telephone at the time of interpretation on 03/26/2014 at 3:41 am to Dr. Varney Biles , who verbally acknowledged these results.   Electronically Signed   By: Jeb Levering M.D.   On: 03/26/2014 03:42    Assessment/Plan Principal Problem:   SDH (subdural hematoma) Active Problems:   Fall   UTI (lower urinary tract infection)   Alzheimer's dementia   Essential hypertension, benign   1. SDH (subdural hematoma) The patient is presenting with an unwitnessed fall. Patient mentions he slid out of her bed. Denies any dizziness or lightheadedness prior to the fall. She mentions she has multiple falls recently. CT of the head shows both acute and subacute subdural hematoma. Case was discussed with neurosurgery who recommends no surgical intervention at present. Continue close monitoring with serial neuro checks and telemetry. If there is any further worsening the recommended to obtain a CT head otherwise no CT had recommended from neurosurgery. Neurosurgery also recommended that patient get admitted to [facility]. We will continue close monitoring. Hold aspirin. PTOT consultation.  2.History of hypertension. Continue home medication.  3. history of morbid disorder. Continue Celexa.  4. UTI. Patient presents with recurrent fall and urine shows possible UTI. With this she will be treated with ceftriaxone.   Advance goals of  care discussion: Full code   Consults: neurosurgery  DVT Prophylaxis: mechanical compression device Nutrition: Nothing by mouth except medications  Disposition: Admitted to inpatient in telemetry  unit.  Author: Berle Mull, MD [facility] Pager: [phone] 03/26/2014, 6:50 AM    If 7PM-7AM, please contact night-coverage [email or web address] Password [email or web address]

## 2014-03-26 NOTE — Progress Notes (Signed)
ANTIBIOTIC CONSULT NOTE - INITIAL  Pharmacy Consult for ceftriaxone Indication: UTI  Allergies  Allergen Reactions  . Codeine     States she blacks out. Listed on [facility]    Patient Measurements: Height: 5\' 5"  (165.1 cm) Weight: 184 lb 8.4 oz (83.7 kg) IBW/kg (Calculated) : 57   Vital Signs: Temp: 98.1 F (36.7 C) (02/25 0634) Temp Source: Oral (02/25 0634) BP: 162/83 mmHg (02/25 0634) Pulse Rate: 62 (02/25 0634) Intake/Output from previous day:   Intake/Output from this shift:    Labs:  Recent Labs  03/26/14 0357  WBC 5.5  HGB 10.1*  PLT 185  CREATININE 1.43*   Estimated Creatinine Clearance: 26.8 mL/min (by C-G formula based on Cr of 1.43).    Medical History: Past Medical History  Diagnosis Date  . Hypertension   . Hyperlipidemia LDL goal < 100   . Polymyalgia rheumatica   . GERD (gastroesophageal reflux disease)   . Irritable bowel syndrome   . Diverticulitis   . Sciatica   . Vertigo   . Edema of leg   . Glaucoma   . Appendicitis   . Arthritis   . Acute upper respiratory infections of unspecified site   . Urge incontinence   . Other specified symptom associated with female genital organs   . Regional enteritis of unspecified site   . Sebaceous cyst   . Depression   . Other malaise and fatigue   . Abnormal weight gain   . Leukocytopenia, unspecified   . Dementia in conditions classified elsewhere without behavioral disturbance   . Anemia, unspecified   . Chronic kidney disease, stage III (moderate)   . Unspecified arthropathy, shoulder region   . Other B-complex deficiencies   . Unspecified vitamin D deficiency   . Memory loss   . Abnormality of gait   . Personal history of fall   . Malignant neoplasm of breast (female), unspecified site   . Unspecified glaucoma   . Diverticulosis of colon (without mention of hemorrhage)   . Irritable bowel syndrome   . Sciatica   . Dizziness and giddiness    Microbiology: No results to  date  Anti-infectives: 2/25 >> ceftriaxone >>   Assessment: 81 y/o F with dementia was brought to ED after a mechanical fall.  CT head showed small chronic SDH; no surgery is planned.  U/A (+) for pyuria; orders received to begin empiric ceftriaxone with pharmacy dosing assistance.  Goal of Therapy:  Appropriate antibiotic dosing for indication; eradication of infection.   Plan:  1. Ceftriaxone 1 gram IV q24h 2. Follow any available culture results and clinical course.   3. Consider de-escalation as soon as clinically appropriate.  Clayburn Pert, PharmD, BCPS Pager: [phone] 03/26/2014  6:57 AM

## 2014-03-26 NOTE — Progress Notes (Signed)
I was asked to review the CT head of this 81 yo female with dementia who had a mechanical fall. C/o headache but otherwise intact. CT head shows a small R parietal extraaxial hyperdensity/ mixed density collection that has no mass effect or shift. There is no indication for surgery. The SDH is quite tiny and the risk of intervention certainly outweighs the risk of observation. Would repeat CT if her neuro status changes, but at 12 with dementia I don't feel she'd be a great candidate for aggressive surgical intervention even if the SDH grew and caused deterioration (likelihood is small). Please call if I can be of further assistance.

## 2014-03-26 NOTE — Progress Notes (Signed)
Clinical Social Work Department CLINICAL SOCIAL WORK PLACEMENT NOTE 03/26/2014  Patient:  Michelle [name], Michelle [name]  Account Number:  [id number] [place] date:  03/26/2014  Clinical Social Worker:  Sindy Messing, LCSW  Date/time:  03/26/2014 01:00 PM  Clinical Social Work is seeking post-discharge placement for this patient at the following level of care:   [facility]   (*CSW will update this form in Epic as items are completed)   03/26/2014  Patient/family provided with [place] Department of Clinical Social Work's list of facilities offering this level of care within the geographic area requested by the patient (or if unable, by the patient's family).  03/26/2014  Patient/family informed of their freedom to choose among providers that offer the needed level of care, that participate in Medicare, Medicaid or managed care program needed by the patient, have an available bed and are willing to accept the patient.  03/26/2014  Patient/family informed of [facility]' ownership interest in [facility], as well as of the fact that they are under no obligation to receive care at this facility.  PASARR submitted to EDS on 03/26/2014 PASARR number received on 03/26/2014  FL2 transmitted to all facilities in geographic area requested by pt/family on  03/26/2014 FL2 transmitted to all facilities within larger geographic area on   Patient informed that his/her managed care company has contracts with or will negotiate with  certain facilities, including the following:     Patient/family informed of bed offers received:   Patient chooses bed at  Physician recommends and patient chooses bed at    Patient to be transferred to  on   Patient to be transferred to facility by  Patient and family notified of transfer on  Name of family member notified:    The following physician request were entered in Epic:   Additional Comments:

## 2014-03-26 NOTE — Evaluation (Signed)
Physical Therapy Evaluation Patient Details Name: Michelle [name] MRN: [id number] DOB: 09/12/1920 Today's Date: 03/26/2014   History of Present Illness  81 yo female admitted with SDH after sustaning fall at ALF.   Clinical Impression  On eval, pt required Max assist +2 for mobility-able to get pt to EOB. 3 attempts to stand but pt was unable to safely perform task. Difficulty weightshifting anteriorly and maintaining BOS to allow for standing. Recommend SNF unless ALF feels they can provide current level of care.     Follow Up Recommendations SNF;Supervision/Assistance - 24 hour    Equipment Recommendations  None recommended by PT    Recommendations for Other Services OT consult     Precautions / Restrictions Precautions Precautions: Fall Restrictions Weight Bearing Restrictions: No      Mobility  Bed Mobility Overal bed mobility: Needs Assistance Bed Mobility: Supine to Sit;Sit to Supine     Supine to sit: Mod assist;HOB elevated Sit to supine: Max assist   General bed mobility comments: Assist for trunk and bil LEs. Increased time. Utilized bed pad for scooting, positioning.  Transfers Overall transfer level: Needs assistance   Transfers: Sit to/from Stand Sit to Stand: Max assist;+2 physical assistance;+2 safety/equipment         General transfer comment: Attempted sit to stand x 3 with RW from bed. Pt unable to maintain safe BOS to allow for standing. Assist for anterior weightshifting required. Pt able to initiate weightshift but goes into trunk extension as soon as bottom begins to clear surface.   Ambulation/Gait             General Gait Details: NT-pt unable  Stairs            Wheelchair Mobility    Modified Rankin (Stroke Patients Only)       Balance                                             Pertinent Vitals/Pain Pain Assessment: No/denies pain    Home Living Family/patient expects to be discharged to::  Assisted living               Home Equipment: Walker - 2 wheels      Prior Function Level of Independence: Needs assistance         Comments: pt difficulty to understand at times due to [place]? accent.      Hand Dominance        Extremity/Trunk Assessment   Upper Extremity Assessment: Defer to OT evaluation           Lower Extremity Assessment: Generalized weakness      Cervical / Trunk Assessment: Kyphotic  Communication   Communication: No difficulties  Cognition Arousal/Alertness: Awake/alert Behavior During Therapy: WFL for tasks assessed/performed Overall Cognitive Status: Within Functional Limits for tasks assessed                      General Comments      Exercises        Assessment/Plan    PT Assessment Patient needs continued PT services  PT Diagnosis Difficulty walking;Generalized weakness   PT Problem List Decreased strength;Decreased range of motion;Decreased activity tolerance;Decreased balance;Decreased mobility;Obesity;Decreased knowledge of use of DME  PT Treatment Interventions DME instruction;Gait training;Functional mobility training;Therapeutic activities;Therapeutic exercise;Patient/family education;Balance training   PT Goals (Current goals can be found in the  Care Plan section) Acute Rehab PT Goals Patient Stated Goal: none stated PT Goal Formulation: With patient Time For Goal Achievement: 04/09/14 Potential to Achieve Goals: Fair    Frequency Min 3X/week   Barriers to discharge        Co-evaluation               End of Session Equipment Utilized During Treatment: Gait belt Activity Tolerance: Patient tolerated treatment well Patient left: in bed;with call bell/phone within reach;with bed alarm set (MD entered before session ended)           Time: 1572-6203 PT Time Calculation (min) (ACUTE ONLY): 20 min   Charges:   PT Evaluation $Initial PT Evaluation Tier I: 1 Procedure     PT G Codes:         Weston Anna, MPT Pager: [phone]

## 2014-03-26 NOTE — ED Notes (Signed)
Bed: WA04 Expected date:  Expected time:  Means of arrival:  Comments: EMS 92 from SNF-fell hit back of head

## 2014-03-27 DIAGNOSIS — S065X0D Traumatic subdural hemorrhage without loss of consciousness, subsequent encounter: Secondary | ICD-10-CM

## 2014-03-27 LAB — CBC
HEMATOCRIT: 31.4 % — AB (ref 36.0–46.0)
Hemoglobin: 9.7 g/dL — ABNORMAL LOW (ref 12.0–15.0)
MCH: 30.2 pg (ref 26.0–34.0)
MCHC: 30.9 g/dL (ref 30.0–36.0)
MCV: 97.8 fL (ref 78.0–100.0)
Platelets: 178 10*3/uL (ref 150–400)
RBC: 3.21 MIL/uL — ABNORMAL LOW (ref 3.87–5.11)
RDW: 14.9 % (ref 11.5–15.5)
WBC: 3.7 10*3/uL — ABNORMAL LOW (ref 4.0–10.5)

## 2014-03-27 NOTE — Evaluation (Signed)
Occupational Therapy Evaluation Patient Details Name: Michelle [name] MRN: [id number] DOB: [birth year] Today's Date: 03/27/2014    History of Present Illness 81 yo female admitted with SDH after sustaning fall at ALF.    Clinical Impression   Pt demonstrates decline in function and safety with ADLs and ADL mobility with decreased strength, balance and endurance. Pt requires + 2 assist with sit - stand/transfers. Pt fearful of falling. Pt would benefit from acute OT services to address impairments to increase level of function and safety    Follow Up Recommendations  SNF;Supervision/Assistance - 24 hour    Equipment Recommendations  None recommended by OT;Other (comment) (TBD at next venue of care)    Recommendations for Other Services       Precautions / Restrictions Precautions Precautions: Fall Restrictions Weight Bearing Restrictions: No      Mobility Bed Mobility Overal bed mobility: Needs Assistance Bed Mobility: Supine to Sit;Sit to Supine     Supine to sit: Mod assist;HOB elevated Sit to supine: Max assist   General bed mobility comments: Assist for trunk and bil LEs. Increased time. Utilized bed pad for scooting, positioning.  Transfers Overall transfer level: Needs assistance   Transfers: Sit to/from Stand Sit to Stand: Max assist;+2 physical assistance;+2 safety/equipment         General transfer comment: Attempted sit to stand x 3  from bed. Pt unable to keep feet within BOS to allow for standing. Assist for anterior weightshifting required. Pt able to initiate weightshift but goes into trunk extension as soon as bottom begins to clear surface.     Balance Overall balance assessment: Needs assistance Sitting-balance support: No upper extremity supported;Feet supported Sitting balance-Leahy Scale: Fair       Standing balance-Leahy Scale: Zero                              ADL Overall ADL's : Needs assistance/impaired      Grooming: Wash/dry hands;Wash/dry face;Set up;Supervision/safety;Sitting   Upper Body Bathing: Min guard;Sitting   Lower Body Bathing: Maximal assistance;Sitting/lateral leans   Upper Body Dressing : Min guard;Sitting   Lower Body Dressing: Total assistance   Toilet Transfer: Maximal assistance;Total assistance;+2 for physical assistance;+2 for safety/equipment   Toileting- Clothing Manipulation and Hygiene: Total assistance       Functional mobility during ADLs: Maximal assistance;Total assistance General ADL Comments: pt fearful of falling     Vision  wears glasses   Perception Perception Perception Tested?: No   Praxis Praxis Praxis tested?: Not tested    Pertinent Vitals/Pain Pain Assessment: No/denies pain     Hand Dominance Right   Extremity/Trunk Assessment Upper Extremity Assessment Upper Extremity Assessment: Generalized weakness   Lower Extremity Assessment Lower Extremity Assessment: Defer to PT evaluation   Cervical / Trunk Assessment Cervical / Trunk Assessment: Kyphotic   Communication Communication Communication: No difficulties;Other (comment) (speaks [place], [place] and [place])   Cognition Arousal/Alertness: Awake/alert Behavior During Therapy: WFL for tasks assessed/performed Overall Cognitive Status: Within Functional Limits for tasks assessed                     General Comments   pt pleasant, cooperative; talkative                 Home Living Family/patient expects to be discharged to:: Assisted living  Home Equipment: [place] - 2 wheels          Prior Functioning/Environment Level of Independence: Needs assistance    ADL's / Homemaking Assistance Needed: pt reports that staff at [facility] assist her with bathing and dressing        OT Diagnosis: Generalized weakness   OT Problem List: Decreased strength;Decreased knowledge of use of DME or AE;Decreased activity  tolerance;Impaired balance (sitting and/or standing)   OT Treatment/Interventions: Self-care/ADL training;Therapeutic exercise;Patient/family education;Therapeutic activities;DME and/or AE instruction;Neuromuscular education    OT Goals(Current goals can be found in the care plan section) Acute Rehab OT Goals Patient Stated Goal: get better OT Goal Formulation: With patient Time For Goal Achievement: 04/03/14 Potential to Achieve Goals: Good ADL Goals Pt Will Perform Grooming: with set-up;with supervision;sitting Pt Will Perform Upper Body Bathing: with supervision;with set-up;sitting Pt Will Perform Lower Body Bathing: with mod assist;sitting/lateral leans Pt Will Perform Upper Body Dressing: with supervision;with set-up;sitting Pt Will Transfer to Toilet: with mod assist;bedside commode  OT Frequency: Min 2X/week   Barriers to D/C: Decreased caregiver support                        End of Session Nurse Communication: Mobility status  Activity Tolerance: Patient limited by fatigue Patient left: in bed;with call bell/phone within reach;with bed alarm set;with nursing/sitter in room   Time: 9629-5284 OT Time Calculation (min): 36 min Charges:  OT General Charges $OT Visit: 1 Procedure OT Evaluation $Initial OT Evaluation Tier I: 1 Procedure OT Treatments $Self Care/Home Management : 8-22 mins $Therapeutic Activity: 8-22 mins G-Codes:    Britt Bottom 03/27/2014, 12:24 PM

## 2014-03-27 NOTE — Progress Notes (Signed)
Progress Note   Michelle [name] AOZ:[id number] DOB: 02-25-1920 DOA: 03/26/2014 PCP: Bufford Spikes, DO   Brief Narrative:   Prem Moyeda is an 81 y.o. female with a PMH of hypertension, GERD, possible PMR, depression, dementia, breast cancer and chronic kidney disease who was admitted on 03/26/14 after suffering from a fall where she fell out of bed and hit the ground with resultant subdural hematoma.  Assessment/Plan:   Principal Problem:   SDH (subdural hematoma) secondary to fall - CT of the head showed both acute and subacute subdural hematoma. - The neurosurgeon on call reviewed her films and recommended no surgical intervention. - Being followed with serial neuro checks.  Still a bit more confused than usual baseline. - Repeat CT scan if any deterioration of neurological exam---neuro exam stable. - Holding aspirin. - PT/OT consultations performed: SNF recommended.  Active Problems:   Acute kidney injury/Stage III CKD - Monitor.  Baseline creatinine is around 1.1-1.4.    UTI (lower urinary tract infection) - On empiric Rocephin. Follow-up urine culture results.    Alzheimer's dementia - Stable.    Essential hypertension, benign - Continue Norvasc.    DVT Prophylaxis - SCDS.  Code Status: Full. Family Communication: [name] (daughter updated by telephone 03/26/14). Disposition Plan: From [facility] ALF, may need SNF at d/c.   IV Access:    Peripheral IV   Procedures and diagnostic studies:   Dg Pelvis 1-2 Views 03/26/2014: No pelvic fracture.     Ct Head & C spine Wo Contrast 03/26/2014: 1. Right subdural hematoma along the high convexity, with both acute and subacute components. Maximal thickness 9 mm. No significant associated mass effect or midline shift. 2. Stable degenerative change in the cervical spine without acute fracture.     Medical Consultants:    Dr. Marikay Alar, Neurosurgery.  Anti-Infectives:    Rocephin  03/26/14--->  Subjective:   Michelle [name] reports occasional headache, denies nausea.  Has generalized weakness.  Bowels moving.  Objective:    Filed Vitals:   03/26/14 1336 03/26/14 2025 03/27/14 0435 03/27/14 0533  BP: 149/96 115/64 170/69 150/70  Pulse: 66 70 60   Temp: 98.5 F (36.9 C) 98.2 F (36.8 C) 98.5 F (36.9 C)   TempSrc: Oral Oral Oral   Resp: 18 20 13    Height:      Weight:   84.9 kg (187 lb 2.7 oz)   SpO2: 98% 98% 100%     Intake/Output Summary (Last 24 hours) at 03/27/14 0751 Last data filed at 03/26/14 1000  Gross per 24 hour  Intake      3 ml  Output      0 ml  Net      3 ml    Exam: Gen:  NAD Cardiovascular:  RRR, II/VI SEM Respiratory:  Lungs CTAB Gastrointestinal:  Abdomen soft, NT/ND, + BS Neuro: Non-focal, generalized weakness, confused Extremities:  No C/E/C, SCDs on   Data Reviewed:    Labs: Basic Metabolic Panel:  Recent Labs Lab 03/26/14 0357 03/26/14 1204  NA 137 142  K 4.2 3.8  CL 103 107  CO2 25 28  GLUCOSE 117* 115*  BUN 22 20  CREATININE 1.43* 1.17*  CALCIUM 9.0 9.1   GFR Estimated Creatinine Clearance: 33 mL/min (by C-G formula based on Cr of 1.17).  CBC:  Recent Labs Lab 03/26/14 0357 03/27/14 0525  WBC 5.5 3.7*  NEUTROABS 3.9  --   HGB 10.1* 9.7*  HCT 33.8* 31.4*  MCV 98.8  97.8  PLT 185 178   Cardiac Enzymes:  Recent Labs Lab 03/26/14 0357  TROPONINI <0.03   Microbiology Recent Results (from the past 240 hour(s))  MRSA PCR Screening     Status: None   Collection Time: 03/26/14  8:36 AM  Result Value Ref Range Status   MRSA by PCR NEGATIVE NEGATIVE Final    Comment:        The GeneXpert MRSA Assay (FDA approved for NASAL specimens only), is one component of a comprehensive MRSA colonization surveillance program. It is not intended to diagnose MRSA infection nor to guide or monitor treatment for MRSA infections.      Medications:   . amLODipine  5 mg Oral Daily  . cefTRIAXone  (ROCEPHIN)  IV  1 g Intravenous Q24H  . citalopram  10 mg Oral Daily  . famotidine  20 mg Oral Daily  . ferrous sulfate  325 mg Oral Q breakfast  . sodium chloride  3 mL Intravenous Q12H   Continuous Infusions:   Time spent: 25 minutes.    LOS: 1 day   Michelle [name]  [facility] Pager [phone]. If unable to reach me by pager, please call my cell phone at [phone].  *Please refer to [email or web address], password [email or web address] to get updated schedule on who will round on this patient, as hospitalists switch teams weekly. If 7PM-7AM, please contact night-coverage at [email or web address], password [email or web address] for any overnight needs.  03/27/2014, 7:51 AM

## 2014-03-27 NOTE — Progress Notes (Addendum)
Clinical Social Work  CSW spoke with dtr Junita Push) and provided bed offers. Family chose [facility] for rehab. CSW spoke with [facility] and they can accept patient on Sunday if she is medically stable. Family to meet with SNF today to complete paperwork. CSW will leave handoff for weekend CSW and CSW can contact the RN Helene Kelp) at [facility] at [phone] if patient is ready to DC over the weekend. Signed FL2 on chart.  Sindy Messing, [place] [phone]  Addendum [phone] CSW met with patient and dtr at bedside. Patient wanted reassurance that she and dtr were making the right decision for SNF placement. CSW asked patient to identify her worries about SNF and validated her feelings of not wanting to accept change. After conversation, patient agreeable to SNF and understanding that MD is recommending placement for ST stay to ensure her safety.

## 2014-03-28 MED ORDER — AMLODIPINE BESYLATE 10 MG PO TABS
10.0000 mg | ORAL_TABLET | Freq: Every day | ORAL | Status: DC
  Administered 2014-03-29: 10 mg via ORAL
  Filled 2014-03-28: qty 1

## 2014-03-28 NOTE — Progress Notes (Signed)
Progress Note   Michelle [name] WUJ:[id number] DOB: 08-01-1920 DOA: 03/26/2014 PCP: Bufford Spikes, DO   Brief Narrative:   Michelle [name] is an 81 y.o. female with a PMH of hypertension, GERD, possible PMR, depression, dementia, breast cancer and chronic kidney disease who was admitted on 03/26/14 after suffering from a fall where she fell out of bed and hit the ground with resultant subdural hematoma.  Assessment/Plan:   Principal Problem:   SDH (subdural hematoma) secondary to fall - CT of the head showed both acute and subacute subdural hematoma. - The neurosurgeon on call reviewed her films and recommended no surgical intervention. - Being followed with serial neuro checks.  Still a bit more confused than usual baseline. - Repeat CT scan if any deterioration of neurological exam---neuro exam stable. - Holding aspirin. - PT/OT consultations performed: SNF recommended.  Active Problems:   Acute kidney injury/Stage III CKD - Baseline creatinine is around 1.1-1.4. Creatinine back to usual baseline values.    UTI (lower urinary tract infection) - On empiric Rocephin. Unfortunately, urine culture is not sent. We'll discontinue as she has had 3 days of therapy.    Alzheimer's dementia - Stable.    Essential hypertension, benign - Increase Norvasc.    DVT Prophylaxis - SCDS.  Code Status: Full. Family Communication: Michelle [name] (daughter updated by telephone 03/26/14). Disposition Plan: From [facility] ALF, may need SNF at d/c.   IV Access:    Peripheral IV   Procedures and diagnostic studies:   Dg Pelvis 1-2 Views 03/26/2014: No pelvic fracture.     Ct Head & C spine Wo Contrast 03/26/2014: 1. Right subdural hematoma along the high convexity, with both acute and subacute components. Maximal thickness 9 mm. No significant associated mass effect or midline shift. 2. Stable degenerative change in the cervical spine without acute fracture.     Medical Consultants:     Dr. Marikay Alar, Neurosurgery.  Anti-Infectives:    Rocephin 03/26/14---> 03/28/14  Subjective:   Michelle [name] reports occasional headache, denies nausea.  Appetite good.  Bowels moving. Reports generalized pain with activity.  Objective:    Filed Vitals:   03/27/14 1124 03/27/14 1403 03/27/14 2034 03/28/14 0437  BP: 141/72 179/82 132/79 161/80  Pulse: 60 64 82 68  Temp: 98.3 F (36.8 C) 98.4 F (36.9 C) 98.3 F (36.8 C) 98.2 F (36.8 C)  TempSrc: Oral Oral Oral Oral  Resp: 18 18 20 17   Height:      Weight:    82.7 kg (182 lb 5.1 oz)  SpO2: 98% 100% 99% 99%   No intake or output data in the 24 hours ending 03/28/14 0803  Exam: Gen:  NAD Cardiovascular:  RRR, II/VI SEM Respiratory:  Lungs CTAB Gastrointestinal:  Abdomen soft, NT/ND, + BS Neuro: Non-focal, generalized weakness, confused Extremities:  No C/E/C, SCDs on   Data Reviewed:    Labs: Basic Metabolic Panel:  Recent Labs Lab 03/26/14 0357 03/26/14 1204  NA 137 142  K 4.2 3.8  CL 103 107  CO2 25 28  GLUCOSE 117* 115*  BUN 22 20  CREATININE 1.43* 1.17*  CALCIUM 9.0 9.1   GFR Estimated Creatinine Clearance: 32.6 mL/min (by C-G formula based on Cr of 1.17).  CBC:  Recent Labs Lab 03/26/14 0357 03/27/14 0525  WBC 5.5 3.7*  NEUTROABS 3.9  --   HGB 10.1* 9.7*  HCT 33.8* 31.4*  MCV 98.8 97.8  PLT 185 178   Cardiac Enzymes:  Recent Labs Lab  03/26/14 0357  TROPONINI <0.03   Microbiology Recent Results (from the past 240 hour(s))  MRSA PCR Screening     Status: None   Collection Time: 03/26/14  8:36 AM  Result Value Ref Range Status   MRSA by PCR NEGATIVE NEGATIVE Final    Comment:        The GeneXpert MRSA Assay (FDA approved for NASAL specimens only), is one component of a comprehensive MRSA colonization surveillance program. It is not intended to diagnose MRSA infection nor to guide or monitor treatment for MRSA infections.      Medications:   . amLODipine   5 mg Oral Daily  . cefTRIAXone (ROCEPHIN)  IV  1 g Intravenous Q24H  . citalopram  10 mg Oral Daily  . famotidine  20 mg Oral Daily  . ferrous sulfate  325 mg Oral Q breakfast  . sodium chloride  3 mL Intravenous Q12H   Continuous Infusions:   Time spent: 25 minutes.    LOS: 2 days   Michelle [name]  [facility] Pager [phone]. If unable to reach me by pager, please call my cell phone at [phone].  *Please refer to [email or web address], password [email or web address] to get updated schedule on who will round on this patient, as hospitalists switch teams weekly. If 7PM-7AM, please contact night-coverage at [email or web address], password [email or web address] for any overnight needs.  03/28/2014, 8:03 AM

## 2014-03-29 DIAGNOSIS — R488 Other symbolic dysfunctions: Secondary | ICD-10-CM | POA: Diagnosis not present

## 2014-03-29 DIAGNOSIS — G894 Chronic pain syndrome: Secondary | ICD-10-CM | POA: Diagnosis not present

## 2014-03-29 DIAGNOSIS — M6281 Muscle weakness (generalized): Secondary | ICD-10-CM | POA: Diagnosis not present

## 2014-03-29 DIAGNOSIS — G309 Alzheimer's disease, unspecified: Secondary | ICD-10-CM | POA: Diagnosis not present

## 2014-03-29 DIAGNOSIS — R262 Difficulty in walking, not elsewhere classified: Secondary | ICD-10-CM | POA: Diagnosis not present

## 2014-03-29 DIAGNOSIS — F329 Major depressive disorder, single episode, unspecified: Secondary | ICD-10-CM | POA: Diagnosis not present

## 2014-03-29 DIAGNOSIS — E785 Hyperlipidemia, unspecified: Secondary | ICD-10-CM | POA: Diagnosis not present

## 2014-03-29 DIAGNOSIS — N179 Acute kidney failure, unspecified: Secondary | ICD-10-CM | POA: Diagnosis not present

## 2014-03-29 DIAGNOSIS — W19XXXD Unspecified fall, subsequent encounter: Secondary | ICD-10-CM | POA: Diagnosis not present

## 2014-03-29 DIAGNOSIS — I10 Essential (primary) hypertension: Secondary | ICD-10-CM | POA: Diagnosis not present

## 2014-03-29 DIAGNOSIS — K219 Gastro-esophageal reflux disease without esophagitis: Secondary | ICD-10-CM | POA: Diagnosis not present

## 2014-03-29 DIAGNOSIS — S065X0D Traumatic subdural hemorrhage without loss of consciousness, subsequent encounter: Secondary | ICD-10-CM | POA: Diagnosis not present

## 2014-03-29 DIAGNOSIS — R1312 Dysphagia, oropharyngeal phase: Secondary | ICD-10-CM | POA: Diagnosis not present

## 2014-03-29 DIAGNOSIS — R05 Cough: Secondary | ICD-10-CM | POA: Diagnosis not present

## 2014-03-29 DIAGNOSIS — R259 Unspecified abnormal involuntary movements: Secondary | ICD-10-CM | POA: Diagnosis not present

## 2014-03-29 DIAGNOSIS — Z9181 History of falling: Secondary | ICD-10-CM | POA: Diagnosis not present

## 2014-03-29 DIAGNOSIS — N390 Urinary tract infection, site not specified: Secondary | ICD-10-CM | POA: Diagnosis not present

## 2014-03-29 DIAGNOSIS — I6200 Nontraumatic subdural hemorrhage, unspecified: Secondary | ICD-10-CM | POA: Diagnosis not present

## 2014-03-29 DIAGNOSIS — I1310 Hypertensive heart and chronic kidney disease without heart failure, with stage 1 through stage 4 chronic kidney disease, or unspecified chronic kidney disease: Secondary | ICD-10-CM | POA: Diagnosis not present

## 2014-03-29 DIAGNOSIS — N183 Chronic kidney disease, stage 3 (moderate): Secondary | ICD-10-CM | POA: Diagnosis not present

## 2014-03-29 DIAGNOSIS — I129 Hypertensive chronic kidney disease with stage 1 through stage 4 chronic kidney disease, or unspecified chronic kidney disease: Secondary | ICD-10-CM | POA: Diagnosis not present

## 2014-03-29 DIAGNOSIS — F0390 Unspecified dementia without behavioral disturbance: Secondary | ICD-10-CM | POA: Diagnosis not present

## 2014-03-29 DIAGNOSIS — H409 Unspecified glaucoma: Secondary | ICD-10-CM | POA: Diagnosis not present

## 2014-03-29 DIAGNOSIS — F0280 Dementia in other diseases classified elsewhere without behavioral disturbance: Secondary | ICD-10-CM | POA: Diagnosis not present

## 2014-03-29 MED ORDER — AMLODIPINE BESYLATE 10 MG PO TABS: 10.0000 mg | ORAL_TABLET | Freq: Every day | ORAL | Status: DC

## 2014-03-29 MED ORDER — AMLODIPINE BESYLATE 5 MG PO TABS: 5.0000 mg | ORAL_TABLET | Freq: Every day | ORAL | Status: DC

## 2014-03-29 MED ORDER — CITALOPRAM HYDROBROMIDE 10 MG PO TABS: 10.0000 mg | ORAL_TABLET | Freq: Every day | ORAL | Status: DC

## 2014-03-29 NOTE — Progress Notes (Signed)
Pt discharged to [facility]. Left unit on stretcher pushed by ambulance personnel. Private caregiver followed. Pt left in good and stable condition. Report called and given to [place], rn. All questions answered to nurse's satisfaction. Discharge packet given to ambulance personnel. Vwilliams,rn.

## 2014-03-29 NOTE — Plan of Care (Signed)
Problem: Phase II Progression Outcomes Goal: Progress activity as tolerated unless otherwise ordered Outcome: Completed/Met Date Met:  03/29/14 Up to bsc

## 2014-03-29 NOTE — Clinical Social Work Placement (Signed)
Clinical Social Work Department CLINICAL SOCIAL WORK PLACEMENT NOTE 03/29/2014  Patient:  Michelle [name], Michelle [name]  Account Number:  [id number] [place] date:  03/26/2014  Clinical Social Worker:  Sindy Messing, LCSW  Date/time:  03/26/2014 01:00 PM  Clinical Social Work is seeking post-discharge placement for this patient at the following level of care:   [place]   (*CSW will update this form in Epic as items are completed)   03/26/2014  Patient/family provided with [place] Department of Clinical Social Work's list of facilities offering this level of care within the geographic area requested by the patient (or if unable, by the patient's family).  03/26/2014  Patient/family informed of their freedom to choose among providers that offer the needed level of care, that participate in Medicare, Medicaid or managed care program needed by the patient, have an available bed and are willing to accept the patient.  03/26/2014  Patient/family informed of [facility]' ownership interest in [facility], as well as of the fact that they are under no obligation to receive care at this facility.  PASARR submitted to EDS on 03/26/2014 PASARR number received on 03/26/2014  FL2 transmitted to all facilities in geographic area requested by pt/family on  03/26/2014 FL2 transmitted to all facilities within larger geographic area on   Patient informed that his/her managed care company has contracts with or will negotiate with  certain facilities, including the following:     Patient/family informed of bed offers received:   Patient chooses bed at [place] Physician recommends and patient chooses bed at    Patient to be transferred to [place] on  03/29/2014 Patient to be transferred to facility by ambulance Patient and family notified of transfer on 03/29/2014 Name of family member notified:  [name]/daughter  The following physician  request were entered in Epic:   Additional Comments:  .Dede Query, [place] Worker - Weekend Coverage cell #: [phone]

## 2014-03-29 NOTE — Discharge Summary (Signed)
Physician Discharge Summary  Michelle [name] ZOX:[id number] DOB: Sep 19, 1920 DOA: 03/26/2014  PCP: Bufford Spikes, DO  Admit date: 03/26/2014 Discharge date: 03/29/2014   Recommendations for Outpatient Follow-Up:   1. The patient is being discharged to a SNF.   Discharge Diagnosis:   Principal Problem:    SDH (subdural hematoma) Active Problems:    HTN (hypertension)    Fall    UTI (lower urinary tract infection)    Alzheimer's dementia    Essential hypertension, benign    Traumatic subdural hematoma   Discharge Condition: Improved.  Diet recommendation: Low sodium, heart healthy.     History of Present Illness:   Michelle [name] is an 81 y.o. female with a PMH of hypertension, GERD, possible PMR, depression, dementia, breast cancer and chronic kidney disease who was admitted on 03/26/14 after suffering from a fall where she fell out of bed and hit the ground with resultant subdural hematoma.   Hospital Course by Problem:   Principal Problem:  SDH (subdural hematoma) secondary to fall - CT of the head showed both acute and subacute subdural hematoma. - The neurosurgeon on call reviewed her films and recommended no surgical intervention. - Being followed with serial neuro checks. Neurological exam has been stable. - Holding aspirin. - PT/OT consultations performed: SNF recommended.  Active Problems:  Acute kidney injury/Stage III CKD - Baseline creatinine is around 1.1-1.4. Creatinine back to usual baseline values.   UTI (lower urinary tract infection) - Urine culture is not sent. S/P 3 days of therapy with Rocephin.   Alzheimer's dementia - Stable.   Essential hypertension, benign - Norvasc increased to 10 mg/daily.    Medical Consultants:    Dr. Marikay Alar, Neurosurgery   Discharge Exam:   Filed Vitals:   03/29/14 0554  BP: 138/68  Pulse: 62  Temp: 98.2 F (36.8 C)  Resp: 18   Filed Vitals:   03/27/14 2034 03/28/14 0437  03/28/14 2153 03/29/14 0554  BP: 132/79 161/80 137/80 138/68  Pulse: 82 68 76 62  Temp: 98.3 F (36.8 C) 98.2 F (36.8 C) 98.1 F (36.7 C) 98.2 F (36.8 C)  TempSrc: Oral Oral Oral Oral  Resp: 20 17 18 18   Height:      Weight:  82.7 kg (182 lb 5.1 oz)    SpO2: 99% 99% 95% 100%   Gen: NAD Cardiovascular: RRR, II/VI SEM Respiratory: Lungs CTAB Gastrointestinal: Abdomen soft, NT/ND, + BS Neuro: Non-focal, generalized weakness, confused Extremities: No C/E/C, SCDs on   The results of significant diagnostics from this hospitalization (including imaging, microbiology, ancillary and laboratory) are listed below for reference.     Procedures and Diagnostic Studies:   Dg Pelvis 1-2 Views 03/26/2014: No pelvic fracture.   Ct Head & C spine Wo Contrast 03/26/2014: 1. Right subdural hematoma along the high convexity, with both acute and subacute components. Maximal thickness 9 mm. No significant associated mass effect or midline shift. 2. Stable degenerative change in the cervical spine without acute fracture.   Labs:   Basic Metabolic Panel:  Recent Labs Lab 03/26/14 0357 03/26/14 1204  NA 137 142  K 4.2 3.8  CL 103 107  CO2 25 28  GLUCOSE 117* 115*  BUN 22 20  CREATININE 1.43* 1.17*  CALCIUM 9.0 9.1   GFR Estimated Creatinine Clearance: 32.6 mL/min (by C-G formula based on Cr of 1.17).  CBC:  Recent Labs Lab 03/26/14 0357 03/27/14 0525  WBC 5.5 3.7*  NEUTROABS 3.9  --   HGB 10.1*  9.7*  HCT 33.8* 31.4*  MCV 98.8 97.8  PLT 185 178   Cardiac Enzymes:  Recent Labs Lab 03/26/14 0357  TROPONINI <0.03   Microbiology Recent Results (from the past 240 hour(s))  MRSA PCR Screening     Status: None   Collection Time: 03/26/14  8:36 AM  Result Value Ref Range Status   MRSA by PCR NEGATIVE NEGATIVE Final    Comment:        The GeneXpert MRSA Assay (FDA approved for NASAL specimens only), is one component of a comprehensive MRSA  colonization surveillance program. It is not intended to diagnose MRSA infection nor to guide or monitor treatment for MRSA infections.      Discharge Instructions:       Discharge Instructions    Call MD for:  difficulty breathing, headache or visual disturbances    Complete by:  As directed      Call MD for:  extreme fatigue    Complete by:  As directed      Call MD for:  persistant dizziness or light-headedness    Complete by:  As directed      Call MD for:  persistant nausea and vomiting    Complete by:  As directed      Diet - low sodium heart healthy    Complete by:  As directed      Discharge instructions    Complete by:  As directed   You were cared for by Dr. Hillery Aldo  (a hospitalist) during your hospital stay. If you have any questions about your discharge medications or the care you received while you were in the hospital after you are discharged, you can call the unit and ask to speak with the hospitalist on call if the hospitalist that took care of you is not available. Once you are discharged, your primary care physician will handle any further medical issues. Please note that NO REFILLS for any discharge medications will be authorized once you are discharged, as it is imperative that you return to your primary care physician (or establish a relationship with a primary care physician if you do not have one) for your aftercare needs so that they can reassess your need for medications and monitor your lab values.  Any outstanding tests can be reviewed by your PCP at your follow up visit.  It is also important to review any medicine changes with your PCP.  Please bring these d/c instructions with you to your next visit so your physician can review these changes with you.  If you do not have a primary care physician, you can call [phone] for a physician referral.  It is highly recommended that you obtain a PCP for hospital follow up.     Increase activity slowly     Complete by:  As directed      Walk with assistance    Complete by:  As directed      Walker     Complete by:  As directed             Medication List    STOP taking these medications        albuterol (2.5 MG/3ML) 0.083% nebulizer solution  Commonly known as:  PROVENTIL     albuterol 108 (90 BASE) MCG/ACT inhaler  Commonly known as:  PROVENTIL HFA;VENTOLIN HFA     aspirin 81 MG chewable tablet      TAKE these medications        acetaminophen  500 MG tablet  Commonly known as:  TYLENOL  Take 500 mg by mouth every 6 (six) hours as needed for mild pain or headache.     amLODipine 10 MG tablet  Commonly known as:  NORVASC  Take 1 tablet (10 mg total) by mouth daily with breakfast.     benzonatate 100 MG capsule  Commonly known as:  TESSALON PERLES  Take 1 capsule (100 mg total) by mouth 3 (three) times daily as needed for cough.     bimatoprost 0.01 % Soln  Commonly known as:  LUMIGAN  Place 1 drop into both eyes at bedtime.     calcium carbonate 200 MG capsule  Take 200 mg by mouth daily with breakfast.     citalopram 10 MG tablet  Commonly known as:  CELEXA  Take 1 tablet (10 mg total) by mouth daily.     COMBIGAN 0.2-0.5 % ophthalmic solution  Generic drug:  brimonidine-timolol  Place 1 drop into both eyes 2 (two) times daily.     dorzolamide 2 % ophthalmic solution  Commonly known as:  TRUSOPT  Place 1 drop into both eyes 2 (two) times daily.     ferrous sulfate 325 (65 FE) MG tablet  Take 325 mg by mouth daily with breakfast.     guaiFENesin 100 MG/5ML liquid  Commonly known as:  ROBITUSSIN  Take 600 mg by mouth 3 (three) times daily as needed for cough.     lidocaine 5 %  Commonly known as:  LIDODERM  APPLY 1 PATCH TO EACH SHOULDER , KEEP ON 12 HOURS, THEN KEEP OFF FOR 12 HOURS.     mineral oil liquid  Take 1 mL by mouth daily as needed for moderate constipation. To right ear     multivitamin with minerals Tabs tablet  Take 1 tablet by mouth  daily with breakfast.     ranitidine 150 MG tablet  Commonly known as:  ZANTAC  Take 150 mg by mouth every morning.     traMADol 50 MG tablet  Commonly known as:  ULTRAM  TAKE 1 TABLET 3 TIMES DAILY AS NEEDED FOR PAIN.          Time coordinating discharge: 35 minutes.  Signed:  Morad Tal  Pager [phone] [facility] 03/29/2014, 8:17 AM

## 2014-03-30 ENCOUNTER — Ambulatory Visit: Payer: Medicare Other

## 2014-03-31 ENCOUNTER — Non-Acute Institutional Stay (SKILLED_NURSING_FACILITY): Payer: Medicare Other | Admitting: Internal Medicine

## 2014-03-31 ENCOUNTER — Encounter: Payer: Self-pay | Admitting: Internal Medicine

## 2014-03-31 DIAGNOSIS — I6200 Nontraumatic subdural hemorrhage, unspecified: Secondary | ICD-10-CM | POA: Diagnosis not present

## 2014-03-31 DIAGNOSIS — I10 Essential (primary) hypertension: Secondary | ICD-10-CM

## 2014-03-31 DIAGNOSIS — S065X9A Traumatic subdural hemorrhage with loss of consciousness of unspecified duration, initial encounter: Secondary | ICD-10-CM

## 2014-03-31 DIAGNOSIS — N390 Urinary tract infection, site not specified: Secondary | ICD-10-CM | POA: Diagnosis not present

## 2014-03-31 DIAGNOSIS — N179 Acute kidney failure, unspecified: Secondary | ICD-10-CM

## 2014-03-31 DIAGNOSIS — N183 Chronic kidney disease, stage 3 unspecified: Secondary | ICD-10-CM

## 2014-03-31 DIAGNOSIS — F0280 Dementia in other diseases classified elsewhere without behavioral disturbance: Secondary | ICD-10-CM

## 2014-03-31 DIAGNOSIS — G309 Alzheimer's disease, unspecified: Secondary | ICD-10-CM

## 2014-03-31 DIAGNOSIS — S065XAA Traumatic subdural hemorrhage with loss of consciousness status unknown, initial encounter: Secondary | ICD-10-CM

## 2014-03-31 NOTE — Assessment & Plan Note (Signed)
No reported major declines

## 2014-03-31 NOTE — Assessment & Plan Note (Signed)
-   Norvasc increased to 10 mg/daily

## 2014-03-31 NOTE — Assessment & Plan Note (Signed)
Urine culture is not sent. S/P 3 days of therapy with Rocephin.

## 2014-03-31 NOTE — Assessment & Plan Note (Signed)
Baseline creatinine is around 1.1-1.4. Creatinine back to usual baseline values

## 2014-03-31 NOTE — Progress Notes (Signed)
MRN: [id number] Name: Michelle [name]  Sex: female Age: 81 y.o. DOB: 06/16/1920  [facility]: [facility] Facility/Room:108 Level Of Care: SNF Provider: Inocencio Homes D Emergency Contacts: Extended Emergency Contact Information Primary Emergency Contact: [name],[name] Address: [address]          [place], [place] [address] [place] Phone: [phone] Work Phone: [phone] Relation: Daughter  Code Status: DNR  Allergies: Codeine  Chief Complaint  Patient presents with  . New Admit To SNF    HPI: Patient is 81 y.o. female who fell out of bed and sustained an acute SDH, admitted to SNF for generalized weakness for OT/PT.  Past Medical History  Diagnosis Date  . Hypertension   . Hyperlipidemia LDL goal < 100   . Polymyalgia rheumatica   . GERD (gastroesophageal reflux disease)   . Irritable bowel syndrome   . Diverticulitis   . Sciatica   . Vertigo   . Edema of leg   . Glaucoma   . Appendicitis   . Arthritis   . Acute upper respiratory infections of unspecified site   . Urge incontinence   . Other specified symptom associated with female genital organs   . Regional enteritis of unspecified site   . Sebaceous cyst   . Depression   . Other malaise and fatigue   . Abnormal weight gain   . Leukocytopenia, unspecified   . Dementia in conditions classified elsewhere without behavioral disturbance   . Anemia, unspecified   . Chronic kidney disease, stage III (moderate)   . Unspecified arthropathy, shoulder region   . Other B-complex deficiencies   . Unspecified vitamin D deficiency   . Memory loss   . Abnormality of gait   . Personal history of fall   . Malignant neoplasm of breast (female), unspecified site   . Unspecified glaucoma   . Diverticulosis of colon (without mention of hemorrhage)   . Irritable bowel syndrome   . Sciatica   . Dizziness and giddiness     Past Surgical History  Procedure Laterality Date  . Nasal sinus surgery   1966  . Cholecystectomy  1972  . Cataract extraction  1990  . Breast surgery      R breast lumpectomy for benign disease  . Breast lumpectomy  10/28/10    left breast       Medication List       This list is accurate as of: 03/31/14 10:16 PM.  Always use your most recent med list.               acetaminophen 500 MG tablet  Commonly known as:  TYLENOL  Take 500 mg by mouth every 6 (six) hours as needed for mild pain or headache.     amLODipine 10 MG tablet  Commonly known as:  NORVASC  Take 1 tablet (10 mg total) by mouth daily with breakfast.     benzonatate 100 MG capsule  Commonly known as:  TESSALON PERLES  Take 1 capsule (100 mg total) by mouth 3 (three) times daily as needed for cough.     bimatoprost 0.01 % Soln  Commonly known as:  LUMIGAN  Place 1 drop into both eyes at bedtime.     calcium carbonate 200 MG capsule  Take 200 mg by mouth daily with breakfast.     citalopram 10 MG tablet  Commonly known as:  CELEXA  Take 1 tablet (10 mg total) by mouth daily.     COMBIGAN 0.2-0.5 %  ophthalmic solution  Generic drug:  brimonidine-timolol  Place 1 drop into both eyes 2 (two) times daily.     dorzolamide 2 % ophthalmic solution  Commonly known as:  TRUSOPT  Place 1 drop into both eyes 2 (two) times daily.     ferrous sulfate 325 (65 FE) MG tablet  Take 325 mg by mouth daily with breakfast.     guaiFENesin 100 MG/5ML liquid  Commonly known as:  ROBITUSSIN  Take 600 mg by mouth 3 (three) times daily as needed for cough.     lidocaine 5 %  Commonly known as:  LIDODERM  APPLY 1 PATCH TO EACH SHOULDER , KEEP ON 12 HOURS, THEN KEEP OFF FOR 12 HOURS.     mineral oil liquid  Take 1 mL by mouth daily as needed for moderate constipation. To right ear     multivitamin with minerals Tabs tablet  Take 1 tablet by mouth daily with breakfast.     ranitidine 150 MG tablet  Commonly known as:  ZANTAC  Take 150 mg by mouth every morning.     traMADol 50 MG tablet   Commonly known as:  ULTRAM  TAKE 1 TABLET 3 TIMES DAILY AS NEEDED FOR PAIN.        No orders of the defined types were placed in this encounter.    Immunization History  Administered Date(s) Administered  . Influenza,inj,Quad PF,36+ Mos 11/07/2012, 10/03/2013  . Influenza-Unspecified 12/30/2009, 11/17/2010, 11/30/2011  . Pneumococcal Conjugate-13 01/05/2014  . Pneumococcal Polysaccharide-23 01/30/2006    History  Substance Use Topics  . Smoking status: Never Smoker   . Smokeless tobacco: Never Used  . Alcohol Use: No    Family history is noncontributory    Review of Systems  DATA OBTAINED: from patient, nurse; pt speaks [place] GENERAL:  no fevers, fatigue, appetite changes SKIN: No itching, rash or wounds EYES: No eye pain, redness, discharge EARS: No earache, tinnitus, change in hearing NOSE: No congestion, drainage or bleeding  MOUTH/THROAT: No mouth or tooth pain, No sore throat RESPIRATORY: No cough, wheezing, SOB CARDIAC: No chest pain, palpitations, lower extremity edema  GI: No abdominal pain, No N/V/D or constipation, No heartburn or reflux  GU: No dysuria, frequency or urgency, or incontinence  MUSCULOSKELETAL: No unrelieved bone/joint pain NEUROLOGIC: No headache, dizziness or focal weakness PSYCHIATRIC: No overt anxiety or sadness, No behavior issue.   Filed Vitals:   03/31/14 1454  BP: 141/73  Pulse: 72  Temp: 99.4 F (37.4 C)  Resp: 18    Physical Exam  GENERAL APPEARANCE: Alert, conversant,  No acute distress.  SKIN: No diaphoresis rash HEAD: Normocephalic, atraumatic  EYES: Conjunctiva/lids clear. Pupils round, reactive. EOMs intact.  EARS: External exam WNL, canals clear. Hearing grossly normal.  NOSE: No deformity or discharge.  MOUTH/THROAT: Lips w/o lesions  RESPIRATORY: Breathing is even, unlabored. Lung sounds are clear   CARDIOVASCULAR: Heart RRR no murmurs, rubs or gallops. No peripheral edema.   GASTROINTESTINAL:  Abdomen is soft, non-tender, not distended w/ normal bowel sounds. GENITOURINARY: Bladder non tender, not distended  MUSCULOSKELETAL: No abnormal joints or musculature NEUROLOGIC:  Cranial nerves 2-12 grossly intact PSYCHIATRIC: dementia, no behavioral issues  Patient Active Problem List   Diagnosis Date Noted  . Acute renal failure superimposed on stage 3 chronic kidney disease 03/31/2014  . Subdural hematoma 03/26/2014  . SDH (subdural hematoma) 03/26/2014  . Traumatic subdural hematoma   . Rib pain on right side 10/03/2013  . Glaucoma 10/03/2013  .  Essential hypertension, benign 10/03/2013  . Myalgia and myositis 10/03/2013  . Closed left clavicular fracture 05/15/2013  . Separation of left acromioclavicular joint 05/15/2013  . 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 Alzheimer's dementia 06/30/2012  . Urge incontinence   . Hyperlipidemia LDL goal < 100   . Memory loss 05/23/2012  . Fall 02/13/2011  . UTI (lower urinary tract infection) 02/13/2011  . Difficulty walking 02/12/2011  . Polymyalgia rheumatica 02/12/2011  . HTN (hypertension) 02/12/2011    CBC    Component Value Date/Time   WBC 3.7* 03/27/2014 0525   WBC 3.6* 11/10/2013 1153   WBC 3.4 10/28/2012 1602   RBC 3.21* 03/27/2014 0525   RBC 3.69* 11/10/2013 1153   RBC 3.71* 10/28/2012 1602   HGB 9.7* 03/27/2014 0525   HGB 10.9* 11/10/2013 1153   HCT 31.4* 03/27/2014 0525   HCT 34.5* 11/10/2013 1153   PLT 178 03/27/2014 0525   PLT 178 11/10/2013 1153   MCV 97.8 03/27/2014 0525   MCV 93.6 11/10/2013 1153   LYMPHSABS 1.0 03/26/2014 0357   LYMPHSABS 1.1 11/10/2013 1153   LYMPHSABS 1.2 10/28/2012 1602   MONOABS 0.5 03/26/2014 0357   MONOABS 0.4 11/10/2013 1153   EOSABS 0.0 03/26/2014 0357   EOSABS 0.1 11/10/2013 1153   BASOSABS 0.0 03/26/2014 0357   BASOSABS 0.0 11/10/2013 1153   BASOSABS 0.0 10/28/2012 1602    CMP     Component Value  Date/Time   NA 142 03/26/2014 1204   NA 142 11/10/2013 1153   NA 142 02/17/2013 1215   K 3.8 03/26/2014 1204   K 4.7 11/10/2013 1153   CL 107 03/26/2014 1204   CL 109* 01/04/2012 1144   CO2 28 03/26/2014 1204   CO2 26 11/10/2013 1153   GLUCOSE 115* 03/26/2014 1204   GLUCOSE 94 11/10/2013 1153   GLUCOSE 96 02/17/2013 1215   GLUCOSE 88 01/04/2012 1144   BUN 20 03/26/2014 1204   BUN 24.1 11/10/2013 1153   BUN 19 02/17/2013 1215   CREATININE 1.17* 03/26/2014 1204   CREATININE 1.2* 11/10/2013 1153   CALCIUM 9.1 03/26/2014 1204   CALCIUM 9.6 11/10/2013 1153   PROT 7.7 02/06/2014 1750   PROT 6.9 11/10/2013 1153   PROT 6.6 10/28/2012 1602   ALBUMIN 3.7 02/06/2014 1750   ALBUMIN 3.3* 11/10/2013 1153   AST 20 02/06/2014 1750   AST 17 11/10/2013 1153   ALT 15 02/06/2014 1750   ALT 13 11/10/2013 1153   ALKPHOS 62 02/06/2014 1750   ALKPHOS 66 11/10/2013 1153   BILITOT 1.1 02/06/2014 1750   BILITOT 0.63 11/10/2013 1153   GFRNONAA 39* 03/26/2014 1204   GFRAA 45* 03/26/2014 1204    Assessment and Plan  SDH (subdural hematoma) secondary to fall from bed onto floor - CT of the head showed both acute and subacute subdural hematoma. - The neurosurgeon on call reviewed her films and recommended no surgical intervention. - Being followed with serial neuro checks. Neurological exam has been stable. - Holding aspirin.   Acute renal failure superimposed on stage 3 chronic kidney disease Baseline creatinine is around 1.1-1.4. Creatinine back to usual baseline values   UTI (lower urinary tract infection) Urine culture is not sent. S/P 3 days of therapy with Rocephin.    Essential hypertension, benign - Norvasc increased to 10 mg/daily   Alzheimer's dementia No reported major declines     Hennie Duos, MD

## 2014-03-31 NOTE — Assessment & Plan Note (Signed)
secondary to fall from bed onto floor - CT of the head showed both acute and subacute subdural hematoma. - The neurosurgeon on call reviewed her films and recommended no surgical intervention. - Being followed with serial neuro checks. Neurological exam has been stable. - Holding aspirin.

## 2014-04-10 ENCOUNTER — Ambulatory Visit: Payer: Medicare Other | Admitting: Internal Medicine

## 2014-04-14 DIAGNOSIS — G309 Alzheimer's disease, unspecified: Secondary | ICD-10-CM | POA: Diagnosis not present

## 2014-04-14 DIAGNOSIS — I1310 Hypertensive heart and chronic kidney disease without heart failure, with stage 1 through stage 4 chronic kidney disease, or unspecified chronic kidney disease: Secondary | ICD-10-CM | POA: Diagnosis not present

## 2014-04-14 DIAGNOSIS — M6281 Muscle weakness (generalized): Secondary | ICD-10-CM | POA: Diagnosis not present

## 2014-04-14 DIAGNOSIS — R05 Cough: Secondary | ICD-10-CM | POA: Diagnosis not present

## 2014-04-14 DIAGNOSIS — G894 Chronic pain syndrome: Secondary | ICD-10-CM | POA: Diagnosis not present

## 2014-04-14 DIAGNOSIS — I10 Essential (primary) hypertension: Secondary | ICD-10-CM | POA: Diagnosis not present

## 2014-04-14 DIAGNOSIS — K219 Gastro-esophageal reflux disease without esophagitis: Secondary | ICD-10-CM | POA: Diagnosis not present

## 2014-04-14 DIAGNOSIS — Z9181 History of falling: Secondary | ICD-10-CM | POA: Diagnosis not present

## 2014-04-20 ENCOUNTER — Encounter: Payer: Self-pay | Admitting: Internal Medicine

## 2014-04-20 ENCOUNTER — Non-Acute Institutional Stay (SKILLED_NURSING_FACILITY): Payer: Medicare Other | Admitting: Internal Medicine

## 2014-04-20 DIAGNOSIS — G309 Alzheimer's disease, unspecified: Secondary | ICD-10-CM

## 2014-04-20 DIAGNOSIS — H409 Unspecified glaucoma: Secondary | ICD-10-CM

## 2014-04-20 DIAGNOSIS — S065XAA Traumatic subdural hemorrhage with loss of consciousness status unknown, initial encounter: Secondary | ICD-10-CM

## 2014-04-20 DIAGNOSIS — I10 Essential (primary) hypertension: Secondary | ICD-10-CM | POA: Diagnosis not present

## 2014-04-20 DIAGNOSIS — S065X9A Traumatic subdural hemorrhage with loss of consciousness of unspecified duration, initial encounter: Secondary | ICD-10-CM

## 2014-04-20 DIAGNOSIS — I6200 Nontraumatic subdural hemorrhage, unspecified: Secondary | ICD-10-CM

## 2014-04-20 DIAGNOSIS — F0280 Dementia in other diseases classified elsewhere without behavioral disturbance: Secondary | ICD-10-CM | POA: Diagnosis not present

## 2014-04-20 NOTE — Progress Notes (Signed)
Patient ID: Michelle [name], female   DOB: 17-Jan-1921, 81 y.o.   MRN: [id number]   this is a discharge note.  Level of care skilled.  [facility].   Chief Complaint  Patient presents with  .  discharge note     HPI: Patient is 81 y.o. female who fell out of bed and sustained an acute SDH, admitted to SNF for generalized weakness for OT/PT Her stay here appears to be quite Stefano Gaul has worked with therapy and would benefit from continued therapy.--She has done better with her transfers but will need continued therapy  She ambulates largely in a wheelchair as well as rolling walker although per nursing  And therapy she is quite unsteqady when attempting  To  use the rolling walker  Her vital signs have been stable blood pressure appears to be well controlled  Nursing staff does not report any issues.  She will be going to assisted living facility .  Past Medical History  Diagnosis Date  . Hypertension   . Hyperlipidemia LDL goal < 100   . Polymyalgia rheumatica   . GERD (gastroesophageal reflux disease)   . Irritable bowel syndrome   . Diverticulitis   . Sciatica   . Vertigo   . Edema of leg   . Glaucoma   . Appendicitis   . Arthritis   . Acute upper respiratory infections of unspecified site   . Urge incontinence   . Other specified symptom associated with female genital organs   . Regional enteritis of unspecified site   . Sebaceous cyst   . Depression   . Other malaise and fatigue   . Abnormal weight gain   . Leukocytopenia, unspecified   . Dementia in conditions classified elsewhere without behavioral disturbance   . Anemia, unspecified   . Chronic kidney disease, stage III (moderate)   . Unspecified arthropathy, shoulder region   . Other B-complex deficiencies   . Unspecified vitamin D deficiency   . Memory loss   . Abnormality of gait   . Personal history of fall   . Malignant neoplasm of breast (female), unspecified site   . Unspecified  glaucoma   . Diverticulosis of colon (without mention of hemorrhage)   . Irritable bowel syndrome   . Sciatica   . Dizziness and giddiness     Past Surgical History  Procedure Laterality Date  . Nasal sinus surgery  1966  . Cholecystectomy  1972  . Cataract extraction  1990  . Breast surgery      R breast lumpectomy for benign disease  . Breast lumpectomy  10/28/10    left breast       Medication List       .               acetaminophen 500 MG tablet  Commonly known as:  TYLENOL  Take 500 mg by mouth every 6 (six) hours as needed for mild pain or headache.     amLODipine 10 MG tablet  Commonly known as:  NORVASC  Take 1 tablet (10 mg total) by mouth daily with breakfast.     benzonatate 100 MG capsule  Commonly known as:  TESSALON PERLES  Take 1 capsule (100 mg total) by mouth 3 (three) times daily as needed for cough.     bimatoprost 0.01 % Soln  Commonly known as:  LUMIGAN  Place 1 drop into both eyes at bedtime.     calcium carbonate 200 MG capsule  Take 200  mg by mouth daily with breakfast.     citalopram 10 MG tablet  Commonly known as:  CELEXA  Take 1 tablet (10 mg total) by mouth daily.     COMBIGAN 0.2-0.5 % ophthalmic solution  Generic drug:  brimonidine-timolol  Place 1 drop into both eyes 2 (two) times daily.     dorzolamide 2 % ophthalmic solution  Commonly known as:  TRUSOPT  Place 1 drop into both eyes 2 (two) times daily.     ferrous sulfate 325 (65 FE) MG tablet  Take 325 mg by mouth daily with breakfast.     guaiFENesin 100 MG/5ML liquid  Commonly known as:  ROBITUSSIN  Take 600 mg by mouth 3 (three) times daily as needed for cough.     lidocaine 5 %  Commonly known as:  LIDODERM  APPLY 1 PATCH TO EACH SHOULDER , KEEP ON 12 HOURS, THEN KEEP OFF FOR 12 HOURS.     mineral oil liquid  Take 1 mL by mouth daily as needed for moderate constipation. To right ear     multivitamin with minerals Tabs tablet  Take 1 tablet by mouth daily  with breakfast.     ranitidine 150 MG tablet  Commonly known as:  ZANTAC  Take 150 mg by mouth every morning.     traMADol 50 MG tablet  Commonly known as:  ULTRAM  TAKE 1 TABLET 3 TIMES DAILY AS NEEDED FOR PAIN.      Marland Kitchen    Immunization History  Administered Date(s) Administered  . Influenza,inj,Quad PF,36+ Mos 11/07/2012, 10/03/2013  . Influenza-Unspecified 12/30/2009, 11/17/2010, 11/30/2011  . Pneumococcal Conjugate-13 01/05/2014  . Pneumococcal Polysaccharide-23 01/30/2006    History  Substance Use Topics  . Smoking status: Never Smoker   . Smokeless tobacco: Never Used  . Alcohol Use: No    Family history is noncontributory    Review of Systems  DATA OBTAINED: from patient, nurse; pt speaks [place] and [place] GENERAL:  no fevers, fatigue, appetite changes SKIN: No itching, rash or wounds EYES: No eye pain, redness, discharge --has a history of glaucoma EARS: No earache, tinnitus, change in hearing NOSE: No congestion, drainage or bleeding  MOUTH/THROAT: No mouth or tooth pain, No sore throat RESPIRATORY: No cough, wheezing, SOB CARDIAC: No chest pain, palpitations, lower extremity edema  GI: No abdominal pain, No N/V/D or constipation, occasionally will have some stomach discomfort she is on Zantac  GU: No dysuria, frequency or urgency, or incontinence  MUSCULOSKELETAL: No unrelieved bone/joint pain-- NEUROLOGIC: No headache, dizziness or focal weakness PSYCHIATRIC: No overt anxiety or sadness, No behavior issue.                       Physical Exam Temperature 97.7 pulse 64 respirations 18 blood pressure 124/73 GENERAL APPEARANCE: Alert, conversant,  No acute distress.  SKIN: No diaphoresis rash HEAD: Normocephalic, atraumatic  EYES: Conjunctiva/lids clear. Pupils round, reactive. EOMs intact. Has prescription lenses  EARS: External exam WNL, canals clear. Hearing grossly normal.  NOSE: No deformity or discharge.  MOUTH/THROAT: Oropharynx is clear  mucous membranes moist  RESPIRATORY: Breathing is even, unlabored. Lung sounds are clear   CARDIOVASCULAR: Heart RRR no murmurs, rubs or gallops. No peripheral edema.   GASTROINTESTINAL: Abdomen is soft, non-tender, obese w/ normal bowel sounds.   MUSCULOSKELETAL: No abnormal joints or musculature--moves all extremities 4 still largely ambulating in a wheelchair NEUROLOGIC:  Cranial nerves 2-12 grossly intact--no lateralizing findings her speech is clear PSYCHIATRIC: dementia, no  behavioral issues--appears grossly alert and oriented and appropriate  Patient Active Problem List   Diagnosis Date Noted  . Acute renal failure superimposed on stage 3 chronic kidney disease 03/31/2014  . Subdural hematoma 03/26/2014  . SDH (subdural hematoma) 03/26/2014  . Traumatic subdural hematoma   . Rib pain on right side 10/03/2013  . Glaucoma 10/03/2013  . Essential hypertension, benign 10/03/2013  . Myalgia and myositis 10/03/2013  . Closed left clavicular fracture 05/15/2013  . Separation of left acromioclavicular joint 05/15/2013  . 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 Alzheimer's dementia 06/30/2012  . Urge incontinence   . Hyperlipidemia LDL goal < 100   . Memory loss 05/23/2012  . Fall 02/13/2011  . UTI (lower urinary tract infection) 02/13/2011  . Difficulty walking 02/12/2011  . Polymyalgia rheumatica 02/12/2011  . HTN (hypertension) 02/12/2011      CBC    Component Value Date/Time   WBC 3.7* 03/27/2014 0525   WBC 3.6* 11/10/2013 1153   WBC 3.4 10/28/2012 1602   RBC 3.21* 03/27/2014 0525   RBC 3.69* 11/10/2013 1153   RBC 3.71* 10/28/2012 1602   HGB 9.7* 03/27/2014 0525   HGB 10.9* 11/10/2013 1153   HCT 31.4* 03/27/2014 0525   HCT 34.5* 11/10/2013 1153   PLT 178 03/27/2014 0525   PLT 178 11/10/2013 1153   MCV 97.8 03/27/2014 0525   MCV 93.6 11/10/2013 1153   LYMPHSABS 1.0 03/26/2014 0357   LYMPHSABS 1.1  11/10/2013 1153   LYMPHSABS 1.2 10/28/2012 1602   MONOABS 0.5 03/26/2014 0357   MONOABS 0.4 11/10/2013 1153   EOSABS 0.0 03/26/2014 0357   EOSABS 0.1 11/10/2013 1153   BASOSABS 0.0 03/26/2014 0357   BASOSABS 0.0 11/10/2013 1153   BASOSABS 0.0 10/28/2012 1602    CMP     Component Value Date/Time   NA 142 03/26/2014 1204   NA 142 11/10/2013 1153   NA 142 02/17/2013 1215   K 3.8 03/26/2014 1204   K 4.7 11/10/2013 1153   CL 107 03/26/2014 1204   CL 109* 01/04/2012 1144   CO2 28 03/26/2014 1204   CO2 26 11/10/2013 1153   GLUCOSE 115* 03/26/2014 1204   GLUCOSE 94 11/10/2013 1153   GLUCOSE 96 02/17/2013 1215   GLUCOSE 88 01/04/2012 1144   BUN 20 03/26/2014 1204   BUN 24.1 11/10/2013 1153   BUN 19 02/17/2013 1215   CREATININE 1.17* 03/26/2014 1204   CREATININE 1.2* 11/10/2013 1153   CALCIUM 9.1 03/26/2014 1204   CALCIUM 9.6 11/10/2013 1153   PROT 7.7 02/06/2014 1750   PROT 6.9 11/10/2013 1153   PROT 6.6 10/28/2012 1602   ALBUMIN 3.7 02/06/2014 1750   ALBUMIN 3.3* 11/10/2013 1153   AST 20 02/06/2014 1750   AST 17 11/10/2013 1153   ALT 15 02/06/2014 1750   ALT 13 11/10/2013 1153   ALKPHOS 62 02/06/2014 1750   ALKPHOS 66 11/10/2013 1153   BILITOT 1.1 02/06/2014 1750   BILITOT 0.63 11/10/2013 1153   GFRNONAA 39* 03/26/2014 1204   GFRAA 45* 03/26/2014 1204    Assessment and Plan  SDH (subdural hematoma) secondary to fall from bed onto floor - CT of the head showed both acute and subacute subdural hematoma. - The neurosurgeon on call reviewed her films and recommended no surgical intervention. - This has been stable during her stay here aspirin continues to be held -    Acute renal failure superimposed on stage 3 chronic  kidney disease Baseline creatinine is around 1.1-1.4. Creatinine back to usual baseline values--will update this before discharge   UTI (lower urinary tract infection) Urine culture  not sent in hospital. S/P 3 days of therapy with Rocephin--- in  hospital-she has been asymptomatic during her stay here.    Essential hypertension, benign - Norvasc increased to 10 mg/daily--in the hospital this appears to be stable recent blood pressures 124/73 138/70 124/71   Alzheimer's dementia No reported major declines  Glaucoma-continues on topical eyedrops including Combivent  Trusopt andLumigan  Depression --continues on Celexa  Pain management-this appears to be controlled on Ultram as needed as well as on a Lidoderm patch      Patient will be going to [facility] will need continued PT and OT for strengthening as well as nursing support for her numerous medical issues-she also will need a rolling walker for gait abnormality weakness and fall risk--  [id number] note greater than 30 minutes spent preparing this discharge summary-scripts have been written

## 2014-04-21 LAB — BASIC METABOLIC PANEL
BUN: 17 mg/dL (ref 4–21)
CREATININE: 1.2 mg/dL — AB (ref 0.5–1.1)
GLUCOSE: 117 mg/dL
Potassium: 4.2 mmol/L (ref 3.4–5.3)
Sodium: 140 mmol/L (ref 137–147)

## 2014-04-21 LAB — CBC AND DIFFERENTIAL
HEMATOCRIT: 37 % (ref 36–46)
Hemoglobin: 11.6 g/dL — AB (ref 12.0–16.0)
Platelets: 214 10*3/uL (ref 150–399)
WBC: 3.4 10^3/mL

## 2014-04-21 LAB — HEPATIC FUNCTION PANEL
ALT: 12 U/L (ref 7–35)
AST: 19 U/L (ref 13–35)
Alkaline Phosphatase: 79 U/L (ref 25–125)
BILIRUBIN, TOTAL: 0.7 mg/dL

## 2014-04-23 ENCOUNTER — Telehealth: Payer: Self-pay | Admitting: *Deleted

## 2014-04-23 DIAGNOSIS — M791 Myalgia: Secondary | ICD-10-CM | POA: Diagnosis not present

## 2014-04-23 DIAGNOSIS — G309 Alzheimer's disease, unspecified: Secondary | ICD-10-CM | POA: Diagnosis not present

## 2014-04-23 DIAGNOSIS — R531 Weakness: Secondary | ICD-10-CM | POA: Diagnosis not present

## 2014-04-23 DIAGNOSIS — F0280 Dementia in other diseases classified elsewhere without behavioral disturbance: Secondary | ICD-10-CM | POA: Diagnosis not present

## 2014-04-23 DIAGNOSIS — I10 Essential (primary) hypertension: Secondary | ICD-10-CM | POA: Diagnosis not present

## 2014-04-23 DIAGNOSIS — H409 Unspecified glaucoma: Secondary | ICD-10-CM | POA: Diagnosis not present

## 2014-04-23 NOTE — Telephone Encounter (Signed)
Received Labs from [place] lab on patient taken at [facility]. Patient has been discharged from facility. I called patient to schedule follow up appointment and she stated that she has a very busy schedule and need to call us back next week to schedule. Labs placed in Dr. Magdalene Molly folder to review.

## 2014-04-27 ENCOUNTER — Other Ambulatory Visit: Payer: Self-pay | Admitting: *Deleted

## 2014-04-27 DIAGNOSIS — D0512 Intraductal carcinoma in situ of left breast: Secondary | ICD-10-CM

## 2014-04-27 DIAGNOSIS — I10 Essential (primary) hypertension: Secondary | ICD-10-CM | POA: Diagnosis not present

## 2014-04-27 DIAGNOSIS — F0280 Dementia in other diseases classified elsewhere without behavioral disturbance: Secondary | ICD-10-CM | POA: Diagnosis not present

## 2014-04-27 DIAGNOSIS — G309 Alzheimer's disease, unspecified: Secondary | ICD-10-CM | POA: Diagnosis not present

## 2014-04-27 DIAGNOSIS — M791 Myalgia: Secondary | ICD-10-CM | POA: Diagnosis not present

## 2014-04-27 DIAGNOSIS — R531 Weakness: Secondary | ICD-10-CM | POA: Diagnosis not present

## 2014-04-27 DIAGNOSIS — H409 Unspecified glaucoma: Secondary | ICD-10-CM | POA: Diagnosis not present

## 2014-04-28 ENCOUNTER — Other Ambulatory Visit: Payer: Medicare Other

## 2014-04-28 ENCOUNTER — Ambulatory Visit: Payer: Medicare Other | Admitting: Oncology

## 2014-04-28 ENCOUNTER — Other Ambulatory Visit: Payer: Self-pay

## 2014-04-28 DIAGNOSIS — I10 Essential (primary) hypertension: Secondary | ICD-10-CM | POA: Diagnosis not present

## 2014-04-28 DIAGNOSIS — H409 Unspecified glaucoma: Secondary | ICD-10-CM | POA: Diagnosis not present

## 2014-04-28 DIAGNOSIS — G309 Alzheimer's disease, unspecified: Secondary | ICD-10-CM | POA: Diagnosis not present

## 2014-04-28 DIAGNOSIS — M791 Myalgia: Secondary | ICD-10-CM | POA: Diagnosis not present

## 2014-04-28 DIAGNOSIS — R531 Weakness: Secondary | ICD-10-CM | POA: Diagnosis not present

## 2014-04-28 DIAGNOSIS — F0280 Dementia in other diseases classified elsewhere without behavioral disturbance: Secondary | ICD-10-CM | POA: Diagnosis not present

## 2014-04-29 ENCOUNTER — Telehealth: Payer: Self-pay | Admitting: Oncology

## 2014-04-29 ENCOUNTER — Telehealth: Payer: Self-pay

## 2014-04-29 DIAGNOSIS — F0280 Dementia in other diseases classified elsewhere without behavioral disturbance: Secondary | ICD-10-CM | POA: Diagnosis not present

## 2014-04-29 DIAGNOSIS — M791 Myalgia: Secondary | ICD-10-CM | POA: Diagnosis not present

## 2014-04-29 DIAGNOSIS — R531 Weakness: Secondary | ICD-10-CM | POA: Diagnosis not present

## 2014-04-29 DIAGNOSIS — I10 Essential (primary) hypertension: Secondary | ICD-10-CM | POA: Diagnosis not present

## 2014-04-29 DIAGNOSIS — G309 Alzheimer's disease, unspecified: Secondary | ICD-10-CM | POA: Diagnosis not present

## 2014-04-29 DIAGNOSIS — H409 Unspecified glaucoma: Secondary | ICD-10-CM | POA: Diagnosis not present

## 2014-04-29 NOTE — Progress Notes (Signed)
No show

## 2014-04-29 NOTE — Telephone Encounter (Signed)
per pof to sch pt appt-cld pt and voicemail stated "voicemail full". mailed copy of sch to pt

## 2014-04-29 NOTE — Telephone Encounter (Signed)
Patient resumed physical therapy bid x 3 weeks, needs verbal ok to proceed with these orders, verbal order given

## 2014-04-30 DIAGNOSIS — H409 Unspecified glaucoma: Secondary | ICD-10-CM | POA: Diagnosis not present

## 2014-04-30 DIAGNOSIS — R531 Weakness: Secondary | ICD-10-CM | POA: Diagnosis not present

## 2014-04-30 DIAGNOSIS — M791 Myalgia: Secondary | ICD-10-CM | POA: Diagnosis not present

## 2014-04-30 DIAGNOSIS — G309 Alzheimer's disease, unspecified: Secondary | ICD-10-CM | POA: Diagnosis not present

## 2014-04-30 DIAGNOSIS — I10 Essential (primary) hypertension: Secondary | ICD-10-CM | POA: Diagnosis not present

## 2014-04-30 DIAGNOSIS — F0280 Dementia in other diseases classified elsewhere without behavioral disturbance: Secondary | ICD-10-CM | POA: Diagnosis not present

## 2014-05-04 DIAGNOSIS — M791 Myalgia: Secondary | ICD-10-CM | POA: Diagnosis not present

## 2014-05-04 DIAGNOSIS — F0280 Dementia in other diseases classified elsewhere without behavioral disturbance: Secondary | ICD-10-CM | POA: Diagnosis not present

## 2014-05-04 DIAGNOSIS — H409 Unspecified glaucoma: Secondary | ICD-10-CM | POA: Diagnosis not present

## 2014-05-04 DIAGNOSIS — I10 Essential (primary) hypertension: Secondary | ICD-10-CM | POA: Diagnosis not present

## 2014-05-04 DIAGNOSIS — R531 Weakness: Secondary | ICD-10-CM | POA: Diagnosis not present

## 2014-05-04 DIAGNOSIS — G309 Alzheimer's disease, unspecified: Secondary | ICD-10-CM | POA: Diagnosis not present

## 2014-05-05 DIAGNOSIS — Z9181 History of falling: Secondary | ICD-10-CM | POA: Diagnosis not present

## 2014-05-05 DIAGNOSIS — I1310 Hypertensive heart and chronic kidney disease without heart failure, with stage 1 through stage 4 chronic kidney disease, or unspecified chronic kidney disease: Secondary | ICD-10-CM | POA: Diagnosis not present

## 2014-05-05 DIAGNOSIS — G309 Alzheimer's disease, unspecified: Secondary | ICD-10-CM | POA: Diagnosis not present

## 2014-05-05 DIAGNOSIS — M6281 Muscle weakness (generalized): Secondary | ICD-10-CM | POA: Diagnosis not present

## 2014-05-05 DIAGNOSIS — I10 Essential (primary) hypertension: Secondary | ICD-10-CM | POA: Diagnosis not present

## 2014-05-05 DIAGNOSIS — K219 Gastro-esophageal reflux disease without esophagitis: Secondary | ICD-10-CM | POA: Diagnosis not present

## 2014-05-05 DIAGNOSIS — G894 Chronic pain syndrome: Secondary | ICD-10-CM | POA: Diagnosis not present

## 2014-05-05 DIAGNOSIS — R05 Cough: Secondary | ICD-10-CM | POA: Diagnosis not present

## 2014-05-06 DIAGNOSIS — F0280 Dementia in other diseases classified elsewhere without behavioral disturbance: Secondary | ICD-10-CM | POA: Diagnosis not present

## 2014-05-06 DIAGNOSIS — R531 Weakness: Secondary | ICD-10-CM | POA: Diagnosis not present

## 2014-05-06 DIAGNOSIS — M791 Myalgia: Secondary | ICD-10-CM | POA: Diagnosis not present

## 2014-05-06 DIAGNOSIS — I10 Essential (primary) hypertension: Secondary | ICD-10-CM | POA: Diagnosis not present

## 2014-05-06 DIAGNOSIS — G309 Alzheimer's disease, unspecified: Secondary | ICD-10-CM | POA: Diagnosis not present

## 2014-05-06 DIAGNOSIS — H409 Unspecified glaucoma: Secondary | ICD-10-CM | POA: Diagnosis not present

## 2014-05-07 DIAGNOSIS — H409 Unspecified glaucoma: Secondary | ICD-10-CM | POA: Diagnosis not present

## 2014-05-07 DIAGNOSIS — R531 Weakness: Secondary | ICD-10-CM | POA: Diagnosis not present

## 2014-05-07 DIAGNOSIS — I10 Essential (primary) hypertension: Secondary | ICD-10-CM | POA: Diagnosis not present

## 2014-05-07 DIAGNOSIS — M791 Myalgia: Secondary | ICD-10-CM | POA: Diagnosis not present

## 2014-05-07 DIAGNOSIS — G309 Alzheimer's disease, unspecified: Secondary | ICD-10-CM | POA: Diagnosis not present

## 2014-05-07 DIAGNOSIS — F0280 Dementia in other diseases classified elsewhere without behavioral disturbance: Secondary | ICD-10-CM | POA: Diagnosis not present

## 2014-05-08 DIAGNOSIS — F0280 Dementia in other diseases classified elsewhere without behavioral disturbance: Secondary | ICD-10-CM | POA: Diagnosis not present

## 2014-05-08 DIAGNOSIS — G309 Alzheimer's disease, unspecified: Secondary | ICD-10-CM | POA: Diagnosis not present

## 2014-05-08 DIAGNOSIS — M791 Myalgia: Secondary | ICD-10-CM | POA: Diagnosis not present

## 2014-05-08 DIAGNOSIS — R531 Weakness: Secondary | ICD-10-CM | POA: Diagnosis not present

## 2014-05-08 DIAGNOSIS — I10 Essential (primary) hypertension: Secondary | ICD-10-CM | POA: Diagnosis not present

## 2014-05-08 DIAGNOSIS — H409 Unspecified glaucoma: Secondary | ICD-10-CM | POA: Diagnosis not present

## 2014-05-11 ENCOUNTER — Encounter: Payer: Self-pay | Admitting: Internal Medicine

## 2014-05-11 DIAGNOSIS — I10 Essential (primary) hypertension: Secondary | ICD-10-CM | POA: Diagnosis not present

## 2014-05-11 DIAGNOSIS — R531 Weakness: Secondary | ICD-10-CM | POA: Diagnosis not present

## 2014-05-11 DIAGNOSIS — F0280 Dementia in other diseases classified elsewhere without behavioral disturbance: Secondary | ICD-10-CM | POA: Diagnosis not present

## 2014-05-11 DIAGNOSIS — M791 Myalgia: Secondary | ICD-10-CM | POA: Diagnosis not present

## 2014-05-11 DIAGNOSIS — G309 Alzheimer's disease, unspecified: Secondary | ICD-10-CM | POA: Diagnosis not present

## 2014-05-11 DIAGNOSIS — H409 Unspecified glaucoma: Secondary | ICD-10-CM | POA: Diagnosis not present

## 2014-05-12 DIAGNOSIS — H409 Unspecified glaucoma: Secondary | ICD-10-CM | POA: Diagnosis not present

## 2014-05-12 DIAGNOSIS — G309 Alzheimer's disease, unspecified: Secondary | ICD-10-CM | POA: Diagnosis not present

## 2014-05-12 DIAGNOSIS — M791 Myalgia: Secondary | ICD-10-CM | POA: Diagnosis not present

## 2014-05-12 DIAGNOSIS — R531 Weakness: Secondary | ICD-10-CM | POA: Diagnosis not present

## 2014-05-12 DIAGNOSIS — I10 Essential (primary) hypertension: Secondary | ICD-10-CM | POA: Diagnosis not present

## 2014-05-12 DIAGNOSIS — F0280 Dementia in other diseases classified elsewhere without behavioral disturbance: Secondary | ICD-10-CM | POA: Diagnosis not present

## 2014-05-13 DIAGNOSIS — M791 Myalgia: Secondary | ICD-10-CM | POA: Diagnosis not present

## 2014-05-13 DIAGNOSIS — R531 Weakness: Secondary | ICD-10-CM | POA: Diagnosis not present

## 2014-05-13 DIAGNOSIS — G309 Alzheimer's disease, unspecified: Secondary | ICD-10-CM | POA: Diagnosis not present

## 2014-05-13 DIAGNOSIS — F0280 Dementia in other diseases classified elsewhere without behavioral disturbance: Secondary | ICD-10-CM | POA: Diagnosis not present

## 2014-05-13 DIAGNOSIS — H409 Unspecified glaucoma: Secondary | ICD-10-CM | POA: Diagnosis not present

## 2014-05-13 DIAGNOSIS — I10 Essential (primary) hypertension: Secondary | ICD-10-CM | POA: Diagnosis not present

## 2014-05-14 DIAGNOSIS — R531 Weakness: Secondary | ICD-10-CM | POA: Diagnosis not present

## 2014-05-14 DIAGNOSIS — G309 Alzheimer's disease, unspecified: Secondary | ICD-10-CM | POA: Diagnosis not present

## 2014-05-14 DIAGNOSIS — H409 Unspecified glaucoma: Secondary | ICD-10-CM | POA: Diagnosis not present

## 2014-05-14 DIAGNOSIS — M791 Myalgia: Secondary | ICD-10-CM | POA: Diagnosis not present

## 2014-05-14 DIAGNOSIS — I10 Essential (primary) hypertension: Secondary | ICD-10-CM | POA: Diagnosis not present

## 2014-05-14 DIAGNOSIS — F0280 Dementia in other diseases classified elsewhere without behavioral disturbance: Secondary | ICD-10-CM | POA: Diagnosis not present

## 2014-05-16 DIAGNOSIS — F0280 Dementia in other diseases classified elsewhere without behavioral disturbance: Secondary | ICD-10-CM | POA: Diagnosis not present

## 2014-05-16 DIAGNOSIS — H409 Unspecified glaucoma: Secondary | ICD-10-CM | POA: Diagnosis not present

## 2014-05-16 DIAGNOSIS — G309 Alzheimer's disease, unspecified: Secondary | ICD-10-CM | POA: Diagnosis not present

## 2014-05-16 DIAGNOSIS — R531 Weakness: Secondary | ICD-10-CM | POA: Diagnosis not present

## 2014-05-16 DIAGNOSIS — I10 Essential (primary) hypertension: Secondary | ICD-10-CM | POA: Diagnosis not present

## 2014-05-16 DIAGNOSIS — M791 Myalgia: Secondary | ICD-10-CM | POA: Diagnosis not present

## 2014-05-18 DIAGNOSIS — I10 Essential (primary) hypertension: Secondary | ICD-10-CM | POA: Diagnosis not present

## 2014-05-18 DIAGNOSIS — R531 Weakness: Secondary | ICD-10-CM | POA: Diagnosis not present

## 2014-05-18 DIAGNOSIS — H409 Unspecified glaucoma: Secondary | ICD-10-CM | POA: Diagnosis not present

## 2014-05-18 DIAGNOSIS — G309 Alzheimer's disease, unspecified: Secondary | ICD-10-CM | POA: Diagnosis not present

## 2014-05-18 DIAGNOSIS — F0280 Dementia in other diseases classified elsewhere without behavioral disturbance: Secondary | ICD-10-CM | POA: Diagnosis not present

## 2014-05-18 DIAGNOSIS — M791 Myalgia: Secondary | ICD-10-CM | POA: Diagnosis not present

## 2014-05-19 DIAGNOSIS — H409 Unspecified glaucoma: Secondary | ICD-10-CM | POA: Diagnosis not present

## 2014-05-19 DIAGNOSIS — I10 Essential (primary) hypertension: Secondary | ICD-10-CM | POA: Diagnosis not present

## 2014-05-19 DIAGNOSIS — M791 Myalgia: Secondary | ICD-10-CM | POA: Diagnosis not present

## 2014-05-19 DIAGNOSIS — F0280 Dementia in other diseases classified elsewhere without behavioral disturbance: Secondary | ICD-10-CM | POA: Diagnosis not present

## 2014-05-19 DIAGNOSIS — G309 Alzheimer's disease, unspecified: Secondary | ICD-10-CM | POA: Diagnosis not present

## 2014-05-19 DIAGNOSIS — R531 Weakness: Secondary | ICD-10-CM | POA: Diagnosis not present

## 2014-05-20 DIAGNOSIS — N390 Urinary tract infection, site not specified: Secondary | ICD-10-CM | POA: Diagnosis not present

## 2014-05-21 DIAGNOSIS — M791 Myalgia: Secondary | ICD-10-CM | POA: Diagnosis not present

## 2014-05-21 DIAGNOSIS — I10 Essential (primary) hypertension: Secondary | ICD-10-CM | POA: Diagnosis not present

## 2014-05-21 DIAGNOSIS — F0280 Dementia in other diseases classified elsewhere without behavioral disturbance: Secondary | ICD-10-CM | POA: Diagnosis not present

## 2014-05-21 DIAGNOSIS — H409 Unspecified glaucoma: Secondary | ICD-10-CM | POA: Diagnosis not present

## 2014-05-21 DIAGNOSIS — R531 Weakness: Secondary | ICD-10-CM | POA: Diagnosis not present

## 2014-05-21 DIAGNOSIS — G309 Alzheimer's disease, unspecified: Secondary | ICD-10-CM | POA: Diagnosis not present

## 2014-05-22 DIAGNOSIS — F0280 Dementia in other diseases classified elsewhere without behavioral disturbance: Secondary | ICD-10-CM | POA: Diagnosis not present

## 2014-05-22 DIAGNOSIS — M791 Myalgia: Secondary | ICD-10-CM | POA: Diagnosis not present

## 2014-05-22 DIAGNOSIS — R531 Weakness: Secondary | ICD-10-CM | POA: Diagnosis not present

## 2014-05-22 DIAGNOSIS — I10 Essential (primary) hypertension: Secondary | ICD-10-CM | POA: Diagnosis not present

## 2014-05-22 DIAGNOSIS — G309 Alzheimer's disease, unspecified: Secondary | ICD-10-CM | POA: Diagnosis not present

## 2014-05-22 DIAGNOSIS — H409 Unspecified glaucoma: Secondary | ICD-10-CM | POA: Diagnosis not present

## 2014-05-25 DIAGNOSIS — F0280 Dementia in other diseases classified elsewhere without behavioral disturbance: Secondary | ICD-10-CM | POA: Diagnosis not present

## 2014-05-25 DIAGNOSIS — I10 Essential (primary) hypertension: Secondary | ICD-10-CM | POA: Diagnosis not present

## 2014-05-25 DIAGNOSIS — H409 Unspecified glaucoma: Secondary | ICD-10-CM | POA: Diagnosis not present

## 2014-05-25 DIAGNOSIS — G309 Alzheimer's disease, unspecified: Secondary | ICD-10-CM | POA: Diagnosis not present

## 2014-05-25 DIAGNOSIS — M791 Myalgia: Secondary | ICD-10-CM | POA: Diagnosis not present

## 2014-05-25 DIAGNOSIS — R531 Weakness: Secondary | ICD-10-CM | POA: Diagnosis not present

## 2014-05-27 DIAGNOSIS — I10 Essential (primary) hypertension: Secondary | ICD-10-CM | POA: Diagnosis not present

## 2014-05-27 DIAGNOSIS — H409 Unspecified glaucoma: Secondary | ICD-10-CM | POA: Diagnosis not present

## 2014-05-27 DIAGNOSIS — M791 Myalgia: Secondary | ICD-10-CM | POA: Diagnosis not present

## 2014-05-27 DIAGNOSIS — F0280 Dementia in other diseases classified elsewhere without behavioral disturbance: Secondary | ICD-10-CM | POA: Diagnosis not present

## 2014-05-27 DIAGNOSIS — R531 Weakness: Secondary | ICD-10-CM | POA: Diagnosis not present

## 2014-05-27 DIAGNOSIS — G309 Alzheimer's disease, unspecified: Secondary | ICD-10-CM | POA: Diagnosis not present

## 2014-05-28 DIAGNOSIS — I10 Essential (primary) hypertension: Secondary | ICD-10-CM | POA: Diagnosis not present

## 2014-05-28 DIAGNOSIS — F0280 Dementia in other diseases classified elsewhere without behavioral disturbance: Secondary | ICD-10-CM | POA: Diagnosis not present

## 2014-05-28 DIAGNOSIS — G309 Alzheimer's disease, unspecified: Secondary | ICD-10-CM | POA: Diagnosis not present

## 2014-05-28 DIAGNOSIS — R531 Weakness: Secondary | ICD-10-CM | POA: Diagnosis not present

## 2014-05-28 DIAGNOSIS — M791 Myalgia: Secondary | ICD-10-CM | POA: Diagnosis not present

## 2014-05-28 DIAGNOSIS — H409 Unspecified glaucoma: Secondary | ICD-10-CM | POA: Diagnosis not present

## 2014-05-29 DIAGNOSIS — G309 Alzheimer's disease, unspecified: Secondary | ICD-10-CM | POA: Diagnosis not present

## 2014-05-29 DIAGNOSIS — H409 Unspecified glaucoma: Secondary | ICD-10-CM | POA: Diagnosis not present

## 2014-05-29 DIAGNOSIS — R531 Weakness: Secondary | ICD-10-CM | POA: Diagnosis not present

## 2014-05-29 DIAGNOSIS — I10 Essential (primary) hypertension: Secondary | ICD-10-CM | POA: Diagnosis not present

## 2014-05-29 DIAGNOSIS — M791 Myalgia: Secondary | ICD-10-CM | POA: Diagnosis not present

## 2014-05-29 DIAGNOSIS — F0280 Dementia in other diseases classified elsewhere without behavioral disturbance: Secondary | ICD-10-CM | POA: Diagnosis not present

## 2014-06-02 DIAGNOSIS — M791 Myalgia: Secondary | ICD-10-CM | POA: Diagnosis not present

## 2014-06-02 DIAGNOSIS — Z9181 History of falling: Secondary | ICD-10-CM | POA: Diagnosis not present

## 2014-06-02 DIAGNOSIS — I10 Essential (primary) hypertension: Secondary | ICD-10-CM | POA: Diagnosis not present

## 2014-06-02 DIAGNOSIS — M6281 Muscle weakness (generalized): Secondary | ICD-10-CM | POA: Diagnosis not present

## 2014-06-02 DIAGNOSIS — F0280 Dementia in other diseases classified elsewhere without behavioral disturbance: Secondary | ICD-10-CM | POA: Diagnosis not present

## 2014-06-02 DIAGNOSIS — I1310 Hypertensive heart and chronic kidney disease without heart failure, with stage 1 through stage 4 chronic kidney disease, or unspecified chronic kidney disease: Secondary | ICD-10-CM | POA: Diagnosis not present

## 2014-06-02 DIAGNOSIS — H409 Unspecified glaucoma: Secondary | ICD-10-CM | POA: Diagnosis not present

## 2014-06-02 DIAGNOSIS — D649 Anemia, unspecified: Secondary | ICD-10-CM | POA: Diagnosis not present

## 2014-06-02 DIAGNOSIS — G309 Alzheimer's disease, unspecified: Secondary | ICD-10-CM | POA: Diagnosis not present

## 2014-06-02 DIAGNOSIS — G894 Chronic pain syndrome: Secondary | ICD-10-CM | POA: Diagnosis not present

## 2014-06-02 DIAGNOSIS — R531 Weakness: Secondary | ICD-10-CM | POA: Diagnosis not present

## 2014-06-02 DIAGNOSIS — K219 Gastro-esophageal reflux disease without esophagitis: Secondary | ICD-10-CM | POA: Diagnosis not present

## 2014-06-03 DIAGNOSIS — I10 Essential (primary) hypertension: Secondary | ICD-10-CM | POA: Diagnosis not present

## 2014-06-03 DIAGNOSIS — M791 Myalgia: Secondary | ICD-10-CM | POA: Diagnosis not present

## 2014-06-03 DIAGNOSIS — F0280 Dementia in other diseases classified elsewhere without behavioral disturbance: Secondary | ICD-10-CM | POA: Diagnosis not present

## 2014-06-03 DIAGNOSIS — R531 Weakness: Secondary | ICD-10-CM | POA: Diagnosis not present

## 2014-06-03 DIAGNOSIS — H409 Unspecified glaucoma: Secondary | ICD-10-CM | POA: Diagnosis not present

## 2014-06-03 DIAGNOSIS — G309 Alzheimer's disease, unspecified: Secondary | ICD-10-CM | POA: Diagnosis not present

## 2014-06-04 DIAGNOSIS — M791 Myalgia: Secondary | ICD-10-CM | POA: Diagnosis not present

## 2014-06-04 DIAGNOSIS — G309 Alzheimer's disease, unspecified: Secondary | ICD-10-CM | POA: Diagnosis not present

## 2014-06-04 DIAGNOSIS — H409 Unspecified glaucoma: Secondary | ICD-10-CM | POA: Diagnosis not present

## 2014-06-04 DIAGNOSIS — E039 Hypothyroidism, unspecified: Secondary | ICD-10-CM | POA: Diagnosis not present

## 2014-06-04 DIAGNOSIS — F0280 Dementia in other diseases classified elsewhere without behavioral disturbance: Secondary | ICD-10-CM | POA: Diagnosis not present

## 2014-06-04 DIAGNOSIS — E538 Deficiency of other specified B group vitamins: Secondary | ICD-10-CM | POA: Diagnosis not present

## 2014-06-04 DIAGNOSIS — N183 Chronic kidney disease, stage 3 (moderate): Secondary | ICD-10-CM | POA: Diagnosis not present

## 2014-06-04 DIAGNOSIS — I10 Essential (primary) hypertension: Secondary | ICD-10-CM | POA: Diagnosis not present

## 2014-06-04 DIAGNOSIS — E559 Vitamin D deficiency, unspecified: Secondary | ICD-10-CM | POA: Diagnosis not present

## 2014-06-04 DIAGNOSIS — R531 Weakness: Secondary | ICD-10-CM | POA: Diagnosis not present

## 2014-06-04 LAB — LIPID PANEL
Cholesterol: 193 mg/dL (ref 0–200)
HDL: 49 mg/dL (ref 35–70)
LDL Cholesterol: 124 mg/dL
Triglycerides: 102 mg/dL (ref 40–160)

## 2014-06-04 LAB — BASIC METABOLIC PANEL
BUN: 19 mg/dL (ref 4–21)
Creatinine: 1.2 mg/dL — AB (ref 0.5–1.1)
GLUCOSE: 91 mg/dL
Potassium: 4.3 mmol/L (ref 3.4–5.3)
Sodium: 143 mmol/L (ref 137–147)

## 2014-06-04 LAB — HEPATIC FUNCTION PANEL
ALK PHOS: 78 U/L (ref 25–125)
ALT: 10 U/L (ref 7–35)
AST: 13 U/L (ref 13–35)
Bilirubin, Total: 0.7 mg/dL

## 2014-06-04 LAB — CBC AND DIFFERENTIAL
HEMATOCRIT: 33 % — AB (ref 36–46)
Hemoglobin: 10.5 g/dL — AB (ref 12.0–16.0)
Platelets: 212 10*3/uL (ref 150–399)
WBC: 3.7 10^3/mL

## 2014-06-04 LAB — TSH: TSH: 1.7 u[IU]/mL (ref 0.41–5.90)

## 2014-06-04 LAB — HEMOGLOBIN A1C: Hgb A1c MFr Bld: 5.4 % (ref 4.0–6.0)

## 2014-06-05 ENCOUNTER — Encounter: Payer: Self-pay | Admitting: *Deleted

## 2014-06-05 DIAGNOSIS — H409 Unspecified glaucoma: Secondary | ICD-10-CM | POA: Diagnosis not present

## 2014-06-05 DIAGNOSIS — I10 Essential (primary) hypertension: Secondary | ICD-10-CM | POA: Diagnosis not present

## 2014-06-05 DIAGNOSIS — R531 Weakness: Secondary | ICD-10-CM | POA: Diagnosis not present

## 2014-06-05 DIAGNOSIS — F0280 Dementia in other diseases classified elsewhere without behavioral disturbance: Secondary | ICD-10-CM | POA: Diagnosis not present

## 2014-06-05 DIAGNOSIS — G309 Alzheimer's disease, unspecified: Secondary | ICD-10-CM | POA: Diagnosis not present

## 2014-06-05 DIAGNOSIS — M791 Myalgia: Secondary | ICD-10-CM | POA: Diagnosis not present

## 2014-06-08 DIAGNOSIS — R531 Weakness: Secondary | ICD-10-CM | POA: Diagnosis not present

## 2014-06-08 DIAGNOSIS — G309 Alzheimer's disease, unspecified: Secondary | ICD-10-CM | POA: Diagnosis not present

## 2014-06-08 DIAGNOSIS — M791 Myalgia: Secondary | ICD-10-CM | POA: Diagnosis not present

## 2014-06-08 DIAGNOSIS — H409 Unspecified glaucoma: Secondary | ICD-10-CM | POA: Diagnosis not present

## 2014-06-08 DIAGNOSIS — I10 Essential (primary) hypertension: Secondary | ICD-10-CM | POA: Diagnosis not present

## 2014-06-08 DIAGNOSIS — F0280 Dementia in other diseases classified elsewhere without behavioral disturbance: Secondary | ICD-10-CM | POA: Diagnosis not present

## 2014-06-10 DIAGNOSIS — R531 Weakness: Secondary | ICD-10-CM | POA: Diagnosis not present

## 2014-06-10 DIAGNOSIS — G309 Alzheimer's disease, unspecified: Secondary | ICD-10-CM | POA: Diagnosis not present

## 2014-06-10 DIAGNOSIS — I10 Essential (primary) hypertension: Secondary | ICD-10-CM | POA: Diagnosis not present

## 2014-06-10 DIAGNOSIS — F0280 Dementia in other diseases classified elsewhere without behavioral disturbance: Secondary | ICD-10-CM | POA: Diagnosis not present

## 2014-06-10 DIAGNOSIS — M791 Myalgia: Secondary | ICD-10-CM | POA: Diagnosis not present

## 2014-06-10 DIAGNOSIS — H409 Unspecified glaucoma: Secondary | ICD-10-CM | POA: Diagnosis not present

## 2014-06-12 DIAGNOSIS — I10 Essential (primary) hypertension: Secondary | ICD-10-CM | POA: Diagnosis not present

## 2014-06-12 DIAGNOSIS — H409 Unspecified glaucoma: Secondary | ICD-10-CM | POA: Diagnosis not present

## 2014-06-12 DIAGNOSIS — R531 Weakness: Secondary | ICD-10-CM | POA: Diagnosis not present

## 2014-06-12 DIAGNOSIS — M791 Myalgia: Secondary | ICD-10-CM | POA: Diagnosis not present

## 2014-06-12 DIAGNOSIS — G309 Alzheimer's disease, unspecified: Secondary | ICD-10-CM | POA: Diagnosis not present

## 2014-06-12 DIAGNOSIS — F0280 Dementia in other diseases classified elsewhere without behavioral disturbance: Secondary | ICD-10-CM | POA: Diagnosis not present

## 2014-06-15 DIAGNOSIS — M791 Myalgia: Secondary | ICD-10-CM | POA: Diagnosis not present

## 2014-06-15 DIAGNOSIS — F0280 Dementia in other diseases classified elsewhere without behavioral disturbance: Secondary | ICD-10-CM | POA: Diagnosis not present

## 2014-06-15 DIAGNOSIS — G309 Alzheimer's disease, unspecified: Secondary | ICD-10-CM | POA: Diagnosis not present

## 2014-06-15 DIAGNOSIS — H409 Unspecified glaucoma: Secondary | ICD-10-CM | POA: Diagnosis not present

## 2014-06-15 DIAGNOSIS — I10 Essential (primary) hypertension: Secondary | ICD-10-CM | POA: Diagnosis not present

## 2014-06-15 DIAGNOSIS — R531 Weakness: Secondary | ICD-10-CM | POA: Diagnosis not present

## 2014-06-17 DIAGNOSIS — M791 Myalgia: Secondary | ICD-10-CM | POA: Diagnosis not present

## 2014-06-17 DIAGNOSIS — R531 Weakness: Secondary | ICD-10-CM | POA: Diagnosis not present

## 2014-06-17 DIAGNOSIS — H409 Unspecified glaucoma: Secondary | ICD-10-CM | POA: Diagnosis not present

## 2014-06-17 DIAGNOSIS — I10 Essential (primary) hypertension: Secondary | ICD-10-CM | POA: Diagnosis not present

## 2014-06-17 DIAGNOSIS — F0280 Dementia in other diseases classified elsewhere without behavioral disturbance: Secondary | ICD-10-CM | POA: Diagnosis not present

## 2014-06-17 DIAGNOSIS — G309 Alzheimer's disease, unspecified: Secondary | ICD-10-CM | POA: Diagnosis not present

## 2014-06-18 DIAGNOSIS — G309 Alzheimer's disease, unspecified: Secondary | ICD-10-CM | POA: Diagnosis not present

## 2014-06-18 DIAGNOSIS — I10 Essential (primary) hypertension: Secondary | ICD-10-CM | POA: Diagnosis not present

## 2014-06-18 DIAGNOSIS — B351 Tinea unguium: Secondary | ICD-10-CM | POA: Diagnosis not present

## 2014-06-18 DIAGNOSIS — H409 Unspecified glaucoma: Secondary | ICD-10-CM | POA: Diagnosis not present

## 2014-06-18 DIAGNOSIS — R531 Weakness: Secondary | ICD-10-CM | POA: Diagnosis not present

## 2014-06-18 DIAGNOSIS — L84 Corns and callosities: Secondary | ICD-10-CM | POA: Diagnosis not present

## 2014-06-18 DIAGNOSIS — M791 Myalgia: Secondary | ICD-10-CM | POA: Diagnosis not present

## 2014-06-18 DIAGNOSIS — F0280 Dementia in other diseases classified elsewhere without behavioral disturbance: Secondary | ICD-10-CM | POA: Diagnosis not present

## 2014-06-19 DIAGNOSIS — M791 Myalgia: Secondary | ICD-10-CM | POA: Diagnosis not present

## 2014-06-19 DIAGNOSIS — I10 Essential (primary) hypertension: Secondary | ICD-10-CM | POA: Diagnosis not present

## 2014-06-19 DIAGNOSIS — F0280 Dementia in other diseases classified elsewhere without behavioral disturbance: Secondary | ICD-10-CM | POA: Diagnosis not present

## 2014-06-19 DIAGNOSIS — R531 Weakness: Secondary | ICD-10-CM | POA: Diagnosis not present

## 2014-06-19 DIAGNOSIS — G309 Alzheimer's disease, unspecified: Secondary | ICD-10-CM | POA: Diagnosis not present

## 2014-06-19 DIAGNOSIS — H409 Unspecified glaucoma: Secondary | ICD-10-CM | POA: Diagnosis not present

## 2014-06-22 DIAGNOSIS — R531 Weakness: Secondary | ICD-10-CM | POA: Diagnosis not present

## 2014-06-22 DIAGNOSIS — I10 Essential (primary) hypertension: Secondary | ICD-10-CM | POA: Diagnosis not present

## 2014-06-22 DIAGNOSIS — H409 Unspecified glaucoma: Secondary | ICD-10-CM | POA: Diagnosis not present

## 2014-06-22 DIAGNOSIS — M791 Myalgia: Secondary | ICD-10-CM | POA: Diagnosis not present

## 2014-06-22 DIAGNOSIS — F0280 Dementia in other diseases classified elsewhere without behavioral disturbance: Secondary | ICD-10-CM | POA: Diagnosis not present

## 2014-06-22 DIAGNOSIS — G309 Alzheimer's disease, unspecified: Secondary | ICD-10-CM | POA: Diagnosis not present

## 2014-06-23 ENCOUNTER — Telehealth: Payer: Self-pay | Admitting: Oncology

## 2014-06-23 NOTE — Telephone Encounter (Signed)
Returned Advertising account executive. Patient is in a living Environmental consultant

## 2014-06-25 DIAGNOSIS — H409 Unspecified glaucoma: Secondary | ICD-10-CM | POA: Diagnosis not present

## 2014-06-25 DIAGNOSIS — I10 Essential (primary) hypertension: Secondary | ICD-10-CM | POA: Diagnosis not present

## 2014-06-25 DIAGNOSIS — M791 Myalgia: Secondary | ICD-10-CM | POA: Diagnosis not present

## 2014-06-25 DIAGNOSIS — R531 Weakness: Secondary | ICD-10-CM | POA: Diagnosis not present

## 2014-06-25 DIAGNOSIS — F0280 Dementia in other diseases classified elsewhere without behavioral disturbance: Secondary | ICD-10-CM | POA: Diagnosis not present

## 2014-06-25 DIAGNOSIS — G309 Alzheimer's disease, unspecified: Secondary | ICD-10-CM | POA: Diagnosis not present

## 2014-06-26 DIAGNOSIS — R531 Weakness: Secondary | ICD-10-CM | POA: Diagnosis not present

## 2014-06-26 DIAGNOSIS — F0280 Dementia in other diseases classified elsewhere without behavioral disturbance: Secondary | ICD-10-CM | POA: Diagnosis not present

## 2014-06-26 DIAGNOSIS — G309 Alzheimer's disease, unspecified: Secondary | ICD-10-CM | POA: Diagnosis not present

## 2014-06-26 DIAGNOSIS — M791 Myalgia: Secondary | ICD-10-CM | POA: Diagnosis not present

## 2014-06-26 DIAGNOSIS — I10 Essential (primary) hypertension: Secondary | ICD-10-CM | POA: Diagnosis not present

## 2014-06-26 DIAGNOSIS — H409 Unspecified glaucoma: Secondary | ICD-10-CM | POA: Diagnosis not present

## 2014-07-03 DIAGNOSIS — G894 Chronic pain syndrome: Secondary | ICD-10-CM | POA: Diagnosis not present

## 2014-07-03 DIAGNOSIS — I10 Essential (primary) hypertension: Secondary | ICD-10-CM | POA: Diagnosis not present

## 2014-07-03 DIAGNOSIS — D649 Anemia, unspecified: Secondary | ICD-10-CM | POA: Diagnosis not present

## 2014-07-03 DIAGNOSIS — I1310 Hypertensive heart and chronic kidney disease without heart failure, with stage 1 through stage 4 chronic kidney disease, or unspecified chronic kidney disease: Secondary | ICD-10-CM | POA: Diagnosis not present

## 2014-07-03 DIAGNOSIS — K219 Gastro-esophageal reflux disease without esophagitis: Secondary | ICD-10-CM | POA: Diagnosis not present

## 2014-07-03 DIAGNOSIS — G309 Alzheimer's disease, unspecified: Secondary | ICD-10-CM | POA: Diagnosis not present

## 2014-07-03 DIAGNOSIS — M6281 Muscle weakness (generalized): Secondary | ICD-10-CM | POA: Diagnosis not present

## 2014-07-03 DIAGNOSIS — Z9181 History of falling: Secondary | ICD-10-CM | POA: Diagnosis not present

## 2014-07-07 ENCOUNTER — Emergency Department (HOSPITAL_COMMUNITY)
Admission: EM | Admit: 2014-07-07 | Discharge: 2014-07-07 | Disposition: A | Discharge status: Home / Self Care | Payer: Medicare Other | Attending: Emergency Medicine | Admitting: Emergency Medicine

## 2014-07-07 ENCOUNTER — Encounter (HOSPITAL_COMMUNITY): Payer: Self-pay

## 2014-07-07 ENCOUNTER — Emergency Department (HOSPITAL_COMMUNITY): Payer: Medicare Other

## 2014-07-07 DIAGNOSIS — W01198A Fall on same level from slipping, tripping and stumbling with subsequent striking against other object, initial encounter: Secondary | ICD-10-CM | POA: Insufficient documentation

## 2014-07-07 DIAGNOSIS — S0990XA Unspecified injury of head, initial encounter: Secondary | ICD-10-CM | POA: Diagnosis not present

## 2014-07-07 DIAGNOSIS — N183 Chronic kidney disease, stage 3 (moderate): Secondary | ICD-10-CM | POA: Insufficient documentation

## 2014-07-07 DIAGNOSIS — D649 Anemia, unspecified: Secondary | ICD-10-CM | POA: Insufficient documentation

## 2014-07-07 DIAGNOSIS — Z87448 Personal history of other diseases of urinary system: Secondary | ICD-10-CM | POA: Insufficient documentation

## 2014-07-07 DIAGNOSIS — F0280 Dementia in other diseases classified elsewhere without behavioral disturbance: Secondary | ICD-10-CM | POA: Insufficient documentation

## 2014-07-07 DIAGNOSIS — Y9301 Activity, walking, marching and hiking: Secondary | ICD-10-CM | POA: Insufficient documentation

## 2014-07-07 DIAGNOSIS — H409 Unspecified glaucoma: Secondary | ICD-10-CM | POA: Diagnosis not present

## 2014-07-07 DIAGNOSIS — Z8709 Personal history of other diseases of the respiratory system: Secondary | ICD-10-CM | POA: Diagnosis not present

## 2014-07-07 DIAGNOSIS — Y9289 Other specified places as the place of occurrence of the external cause: Secondary | ICD-10-CM | POA: Insufficient documentation

## 2014-07-07 DIAGNOSIS — Y998 Other external cause status: Secondary | ICD-10-CM | POA: Diagnosis not present

## 2014-07-07 DIAGNOSIS — I129 Hypertensive chronic kidney disease with stage 1 through stage 4 chronic kidney disease, or unspecified chronic kidney disease: Secondary | ICD-10-CM | POA: Insufficient documentation

## 2014-07-07 DIAGNOSIS — Z872 Personal history of diseases of the skin and subcutaneous tissue: Secondary | ICD-10-CM | POA: Insufficient documentation

## 2014-07-07 DIAGNOSIS — F329 Major depressive disorder, single episode, unspecified: Secondary | ICD-10-CM | POA: Insufficient documentation

## 2014-07-07 DIAGNOSIS — S199XXA Unspecified injury of neck, initial encounter: Secondary | ICD-10-CM | POA: Diagnosis not present

## 2014-07-07 DIAGNOSIS — Z853 Personal history of malignant neoplasm of breast: Secondary | ICD-10-CM | POA: Insufficient documentation

## 2014-07-07 DIAGNOSIS — K219 Gastro-esophageal reflux disease without esophagitis: Secondary | ICD-10-CM | POA: Insufficient documentation

## 2014-07-07 DIAGNOSIS — E559 Vitamin D deficiency, unspecified: Secondary | ICD-10-CM | POA: Diagnosis not present

## 2014-07-07 DIAGNOSIS — W19XXXA Unspecified fall, initial encounter: Secondary | ICD-10-CM

## 2014-07-07 DIAGNOSIS — Z79899 Other long term (current) drug therapy: Secondary | ICD-10-CM | POA: Diagnosis not present

## 2014-07-07 DIAGNOSIS — M1990 Unspecified osteoarthritis, unspecified site: Secondary | ICD-10-CM | POA: Diagnosis not present

## 2014-07-07 DIAGNOSIS — R51 Headache: Secondary | ICD-10-CM | POA: Diagnosis not present

## 2014-07-07 DIAGNOSIS — T1490 Injury, unspecified: Secondary | ICD-10-CM | POA: Diagnosis not present

## 2014-07-07 NOTE — Discharge Instructions (Signed)
Return to the ER with any changes at your care facility.

## 2014-07-07 NOTE — ED Notes (Signed)
Per EMS- Patient is from [facility]. Patient states she slipped and fell when walking to the bathroom. No c/o pain. No LOC.

## 2014-07-07 NOTE — ED Provider Notes (Addendum)
CSN: [id number]     Arrival date & time 07/07/14  0847 History   First MD Initiated Contact with Patient 07/07/14 [phone]     Chief Complaint  Patient presents with  . Fall      HPI She presents for evaluation after a fall. She is a resident at [facility]. She was walking this morning. She states she stood from bed, held her walker, and took about 2 steps. She states she got her foot caught up against the end of her walker and fell. She struck the right side of her head and face against the ground. She has no bleeding. She did not loss consciousness. She was sent for evaluation from her care facility.  Past Medical History  Diagnosis Date  . Hypertension   . Hyperlipidemia LDL goal < 100   . Polymyalgia rheumatica   . GERD (gastroesophageal reflux disease)   . Irritable bowel syndrome   . Diverticulitis   . Sciatica   . Vertigo   . Edema of leg   . Glaucoma   . Appendicitis   . Arthritis   . Acute upper respiratory infections of unspecified site   . Urge incontinence   . Other specified symptom associated with female genital organs   . Regional enteritis of unspecified site   . Sebaceous cyst   . Depression   . Other malaise and fatigue   . Abnormal weight gain   . Leukocytopenia, unspecified   . Dementia in conditions classified elsewhere without behavioral disturbance   . Anemia, unspecified   . Chronic kidney disease, stage III (moderate)   . Unspecified arthropathy, shoulder region   . Other B-complex deficiencies   . Unspecified vitamin D deficiency   . Memory loss   . Abnormality of gait   . Personal history of fall   . Malignant neoplasm of breast (female), unspecified site   . Unspecified glaucoma   . Diverticulosis of colon (without mention of hemorrhage)   . Irritable bowel syndrome   . Sciatica   . Dizziness and giddiness    Past Surgical History  Procedure Laterality Date  . Nasal sinus surgery  1966  . Cholecystectomy  1972  . Cataract extraction   1990  . Breast surgery      R breast lumpectomy for benign disease  . Breast lumpectomy  10/28/10    left breast    Family History  Problem Relation Age of Onset  . Stroke Father   . Hypertension Mother   . Stroke Mother   . Breast cancer Mother   . Heart attack Brother   . Cancer Brother   . Stroke Brother   . Heart attack Son   . Colon cancer Sister    History  Substance Use Topics  . Smoking status: Never Smoker   . Smokeless tobacco: Never Used  . Alcohol Use: No   OB History    No data available     Review of Systems  Constitutional: Negative for fever, chills, diaphoresis, appetite change and fatigue.  HENT: Negative for mouth sores, sore throat and trouble swallowing.   Eyes: Negative for visual disturbance.  Respiratory: Negative for cough, chest tightness, shortness of breath and wheezing.   Cardiovascular: Negative for chest pain.  Gastrointestinal: Negative for nausea, vomiting, abdominal pain, diarrhea and abdominal distention.  Endocrine: Negative for polydipsia, polyphagia and polyuria.  Genitourinary: Negative for dysuria, frequency and hematuria.  Musculoskeletal: Negative for gait problem.  Skin: Negative for color change, pallor  and rash.  Neurological: Positive for headaches. Negative for dizziness, syncope and light-headedness.  Hematological: Does not bruise/bleed easily.  Psychiatric/Behavioral: Negative for behavioral problems and confusion.      Allergies  Codeine  Home Medications   Prior to Admission medications   Medication Sig Start Date End Date Taking? Authorizing Provider  acetaminophen (TYLENOL) 500 MG tablet Take 500 mg by mouth every 6 (six) hours as needed for mild pain or headache.   Yes Historical Provider, MD  amLODipine (NORVASC) 10 MG tablet Take 1 tablet (10 mg total) by mouth daily with breakfast. 03/29/14  Yes [name], MD  benzonatate (TESSALON PERLES) 100 MG capsule Take 1 capsule (100 mg total) by mouth 3  (three) times daily as needed for cough. 02/13/14  Yes Monica Carter, DO  bimatoprost (LUMIGAN) 0.01 % SOLN Place 1 drop into both eyes at bedtime.    Yes Historical Provider, MD  brimonidine-timolol (COMBIGAN) 0.2-0.5 % ophthalmic solution Place 1 drop into both eyes 2 (two) times daily.    Yes Historical Provider, MD  calcium carbonate 200 MG capsule Take 200 mg by mouth daily with breakfast.    Yes Historical Provider, MD  citalopram (CELEXA) 10 MG tablet Take 1 tablet (10 mg total) by mouth daily. 03/29/14  Yes Christina P Rama, MD  dorzolamide (TRUSOPT) 2 % ophthalmic solution Place 1 drop into both eyes 2 (two) times daily. 02/13/14  Yes Estill Dooms, MD  ferrous sulfate 325 (65 FE) MG tablet Take 325 mg by mouth daily with breakfast.   Yes Historical Provider, MD  guaiFENesin (ROBITUSSIN) 100 MG/5ML liquid Take 600 mg by mouth 3 (three) times daily as needed for cough.   Yes Historical Provider, MD  lidocaine (LIDODERM) 5 % APPLY 1 PATCH TO EACH SHOULDER , KEEP ON 12 HOURS, THEN KEEP OFF FOR 12 HOURS. 02/13/14  Yes Estill Dooms, MD  Multiple Vitamin (MULTIVITAMIN WITH MINERALS) TABS Take 1 tablet by mouth daily with breakfast.    Yes Historical Provider, MD  ranitidine (ZANTAC) 150 MG tablet Take 150 mg by mouth every morning.   Yes Historical Provider, MD  traMADol (ULTRAM) 50 MG tablet TAKE 1 TABLET 3 TIMES DAILY AS NEEDED FOR PAIN. Patient taking differently: Take 50mg  by mouth 3 times a day as needed pain 01/13/14  Yes Tiffany L Reed, DO   BP 140/79 mmHg  Pulse 57  Temp(Src) 98.2 F (36.8 C) (Oral)  Resp 13  SpO2 93% Physical Exam  Constitutional: She is oriented to person, place, and time. She appears well-developed and well-nourished. No distress.  HENT:  Head: Normocephalic.  Eyes: Conjunctivae are normal. Pupils are equal, round, and reactive to light. No scleral icterus.  Neck: Normal range of motion. Neck supple. No thyromegaly present.  Cardiovascular: Normal rate and  regular rhythm.  Exam reveals no gallop and no friction rub.   No murmur heard. Pulmonary/Chest: Effort normal and breath sounds normal. No respiratory distress. She has no wheezes. She has no rales.  Abdominal: Soft. Bowel sounds are normal. She exhibits no distension. There is no tenderness. There is no rebound.  Musculoskeletal: Normal range of motion.  Neurological: She is alert and oriented to person, place, and time.  Skin: Skin is warm and dry. No rash noted.  Psychiatric: She has a normal mood and affect. Her behavior is normal.    ED Course  Procedures (including critical care time) Labs Review Labs Reviewed - No data to display  Imaging Review Ct Head  Wo Contrast  07/07/2014   CLINICAL DATA:  Fall last morning. Initial encounter. Posterior head trauma.  EXAM: CT HEAD WITHOUT CONTRAST  CT CERVICAL SPINE WITHOUT CONTRAST  TECHNIQUE: Multidetector CT imaging of the head and cervical spine was performed following the standard protocol without intravenous contrast. Multiplanar CT image reconstructions of the cervical spine were also generated.  COMPARISON:  03/26/2014.  FINDINGS: CT HEAD FINDINGS  Mucoperiosteal thickening of the residual RIGHT maxillary sinus with RIGHT maxillary antrectomy. Intracranial atherosclerosis. Calvarium intact. Negative for skull fracture. Bilateral lens extractions. Stable benign LEFT basal ganglia calcifications. No mass lesion, mass effect, midline shift, hydrocephalus, hemorrhage. No acute territorial cortical ischemia/infarct. Atrophy and chronic ischemic white matter disease is present. Scarring is present in the area of scalp hematoma or on the RIGHT parietal region.  CT CERVICAL SPINE FINDINGS  Alignment: Levoconvex torticollis which may be positional.  Craniocervical junction: Atlantodental degenerative disease. The odontoid and occipital condyles are intact.  Vertebrae: No fracture or aggressive osseous lesions. Facet ankylosis from C2 through C7 on the  LEFT.  Paraspinal soft tissues: Carotid atherosclerosis. Thyroid enlargement, likely representing goiter. No further evaluation recommended in this patient of advanced age.  Lung apices: Within normal limits.  Central canal grossly patent.  IMPRESSION: 1. Atrophy and chronic ischemic white matter disease without acute intracranial abnormality. No acute intracranial abnormality. 2. RIGHT cerebral convexity subdural hematoma has resolved compared to the prior exam 03/26/2017. 3. Cervical spine degenerative disease without acute abnormality.   Electronically Signed   By: Dereck Ligas M.D.   On: 07/07/2014 10:27   Ct Cervical Spine Wo Contrast  07/07/2014   CLINICAL DATA:  Fall last morning. Initial encounter. Posterior head trauma.  EXAM: CT HEAD WITHOUT CONTRAST  CT CERVICAL SPINE WITHOUT CONTRAST  TECHNIQUE: Multidetector CT imaging of the head and cervical spine was performed following the standard protocol without intravenous contrast. Multiplanar CT image reconstructions of the cervical spine were also generated.  COMPARISON:  03/26/2014.  FINDINGS: CT HEAD FINDINGS  Mucoperiosteal thickening of the residual RIGHT maxillary sinus with RIGHT maxillary antrectomy. Intracranial atherosclerosis. Calvarium intact. Negative for skull fracture. Bilateral lens extractions. Stable benign LEFT basal ganglia calcifications. No mass lesion, mass effect, midline shift, hydrocephalus, hemorrhage. No acute territorial cortical ischemia/infarct. Atrophy and chronic ischemic white matter disease is present. Scarring is present in the area of scalp hematoma or on the RIGHT parietal region.  CT CERVICAL SPINE FINDINGS  Alignment: Levoconvex torticollis which may be positional.  Craniocervical junction: Atlantodental degenerative disease. The odontoid and occipital condyles are intact.  Vertebrae: No fracture or aggressive osseous lesions. Facet ankylosis from C2 through C7 on the LEFT.  Paraspinal soft tissues: Carotid  atherosclerosis. Thyroid enlargement, likely representing goiter. No further evaluation recommended in this patient of advanced age.  Lung apices: Within normal limits.  Central canal grossly patent.  IMPRESSION: 1. Atrophy and chronic ischemic white matter disease without acute intracranial abnormality. No acute intracranial abnormality. 2. RIGHT cerebral convexity subdural hematoma has resolved compared to the prior exam 03/26/2017. 3. Cervical spine degenerative disease without acute abnormality.   Electronically Signed   By: Dereck Ligas M.D.   On: 07/07/2014 10:27     EKG Interpretation None      MDM   Final diagnoses:  Fall  Fall    Mental status remains at baseline here. No acute findings on studies. She is appropriate for discharge back to her care facility.       Tanna Furry, MD 07/07/14 1228  Tanna Furry, MD 07/07/14 1623

## 2014-07-07 NOTE — ED Notes (Signed)
Bed: LK58 Expected date:  Expected time:  Means of arrival:  Comments: 81 y/o F fall **Room 8**

## 2014-07-07 NOTE — ED Notes (Signed)
Patient states she hit the right side of her head and states pain is 6/10. No blurred vision, MAE. No LOC.

## 2014-07-14 DIAGNOSIS — N390 Urinary tract infection, site not specified: Secondary | ICD-10-CM | POA: Diagnosis not present

## 2014-07-30 DIAGNOSIS — N390 Urinary tract infection, site not specified: Secondary | ICD-10-CM | POA: Diagnosis not present

## 2014-07-31 DIAGNOSIS — G894 Chronic pain syndrome: Secondary | ICD-10-CM | POA: Diagnosis not present

## 2014-07-31 DIAGNOSIS — G309 Alzheimer's disease, unspecified: Secondary | ICD-10-CM | POA: Diagnosis not present

## 2014-07-31 DIAGNOSIS — I1310 Hypertensive heart and chronic kidney disease without heart failure, with stage 1 through stage 4 chronic kidney disease, or unspecified chronic kidney disease: Secondary | ICD-10-CM | POA: Diagnosis not present

## 2014-07-31 DIAGNOSIS — F419 Anxiety disorder, unspecified: Secondary | ICD-10-CM | POA: Diagnosis not present

## 2014-07-31 DIAGNOSIS — I10 Essential (primary) hypertension: Secondary | ICD-10-CM | POA: Diagnosis not present

## 2014-07-31 DIAGNOSIS — Z9181 History of falling: Secondary | ICD-10-CM | POA: Diagnosis not present

## 2014-07-31 DIAGNOSIS — K219 Gastro-esophageal reflux disease without esophagitis: Secondary | ICD-10-CM | POA: Diagnosis not present

## 2014-07-31 DIAGNOSIS — M6281 Muscle weakness (generalized): Secondary | ICD-10-CM | POA: Diagnosis not present

## 2014-08-04 DIAGNOSIS — R262 Difficulty in walking, not elsewhere classified: Secondary | ICD-10-CM | POA: Diagnosis not present

## 2014-08-04 DIAGNOSIS — R278 Other lack of coordination: Secondary | ICD-10-CM | POA: Diagnosis not present

## 2014-08-04 DIAGNOSIS — M6281 Muscle weakness (generalized): Secondary | ICD-10-CM | POA: Diagnosis not present

## 2014-08-05 DIAGNOSIS — M6281 Muscle weakness (generalized): Secondary | ICD-10-CM | POA: Diagnosis not present

## 2014-08-05 DIAGNOSIS — R278 Other lack of coordination: Secondary | ICD-10-CM | POA: Diagnosis not present

## 2014-08-05 DIAGNOSIS — N390 Urinary tract infection, site not specified: Secondary | ICD-10-CM | POA: Diagnosis not present

## 2014-08-05 DIAGNOSIS — R262 Difficulty in walking, not elsewhere classified: Secondary | ICD-10-CM | POA: Diagnosis not present

## 2014-08-06 DIAGNOSIS — R262 Difficulty in walking, not elsewhere classified: Secondary | ICD-10-CM | POA: Diagnosis not present

## 2014-08-06 DIAGNOSIS — M6281 Muscle weakness (generalized): Secondary | ICD-10-CM | POA: Diagnosis not present

## 2014-08-06 DIAGNOSIS — R278 Other lack of coordination: Secondary | ICD-10-CM | POA: Diagnosis not present

## 2014-08-07 DIAGNOSIS — M6281 Muscle weakness (generalized): Secondary | ICD-10-CM | POA: Diagnosis not present

## 2014-08-07 DIAGNOSIS — R278 Other lack of coordination: Secondary | ICD-10-CM | POA: Diagnosis not present

## 2014-08-07 DIAGNOSIS — R262 Difficulty in walking, not elsewhere classified: Secondary | ICD-10-CM | POA: Diagnosis not present

## 2014-08-10 DIAGNOSIS — R262 Difficulty in walking, not elsewhere classified: Secondary | ICD-10-CM | POA: Diagnosis not present

## 2014-08-10 DIAGNOSIS — R278 Other lack of coordination: Secondary | ICD-10-CM | POA: Diagnosis not present

## 2014-08-10 DIAGNOSIS — M6281 Muscle weakness (generalized): Secondary | ICD-10-CM | POA: Diagnosis not present

## 2014-08-11 ENCOUNTER — Telehealth: Payer: Self-pay | Admitting: *Deleted

## 2014-08-11 DIAGNOSIS — R262 Difficulty in walking, not elsewhere classified: Secondary | ICD-10-CM | POA: Diagnosis not present

## 2014-08-11 DIAGNOSIS — M6281 Muscle weakness (generalized): Secondary | ICD-10-CM | POA: Diagnosis not present

## 2014-08-11 DIAGNOSIS — R278 Other lack of coordination: Secondary | ICD-10-CM | POA: Diagnosis not present

## 2014-08-11 NOTE — Telephone Encounter (Signed)
Letta Median with [employer] #: [phone] called and needed verbal orders for PT

## 2014-08-11 NOTE — Telephone Encounter (Signed)
Verbal Orders given but instructed Homehealth that patient would need an appointment before anymore orders can be given. She agreed and will have daughter, that is on vacation this week, call us when she gets back into town. Agreed.

## 2014-08-12 DIAGNOSIS — R262 Difficulty in walking, not elsewhere classified: Secondary | ICD-10-CM | POA: Diagnosis not present

## 2014-08-12 DIAGNOSIS — M6281 Muscle weakness (generalized): Secondary | ICD-10-CM | POA: Diagnosis not present

## 2014-08-12 DIAGNOSIS — R278 Other lack of coordination: Secondary | ICD-10-CM | POA: Diagnosis not present

## 2014-08-13 ENCOUNTER — Telehealth: Payer: Self-pay | Admitting: *Deleted

## 2014-08-13 DIAGNOSIS — R262 Difficulty in walking, not elsewhere classified: Secondary | ICD-10-CM | POA: Diagnosis not present

## 2014-08-13 DIAGNOSIS — M6281 Muscle weakness (generalized): Secondary | ICD-10-CM | POA: Diagnosis not present

## 2014-08-13 DIAGNOSIS — R278 Other lack of coordination: Secondary | ICD-10-CM | POA: Diagnosis not present

## 2014-08-13 NOTE — Telephone Encounter (Signed)
Spoke with daughter regarding her mother, she stated that her mother is at [facility] and being seen by the physician at that facility.

## 2014-08-14 DIAGNOSIS — R262 Difficulty in walking, not elsewhere classified: Secondary | ICD-10-CM | POA: Diagnosis not present

## 2014-08-14 DIAGNOSIS — R278 Other lack of coordination: Secondary | ICD-10-CM | POA: Diagnosis not present

## 2014-08-14 DIAGNOSIS — M6281 Muscle weakness (generalized): Secondary | ICD-10-CM | POA: Diagnosis not present

## 2014-08-17 DIAGNOSIS — M6281 Muscle weakness (generalized): Secondary | ICD-10-CM | POA: Diagnosis not present

## 2014-08-17 DIAGNOSIS — R278 Other lack of coordination: Secondary | ICD-10-CM | POA: Diagnosis not present

## 2014-08-17 DIAGNOSIS — R262 Difficulty in walking, not elsewhere classified: Secondary | ICD-10-CM | POA: Diagnosis not present

## 2014-08-18 DIAGNOSIS — R278 Other lack of coordination: Secondary | ICD-10-CM | POA: Diagnosis not present

## 2014-08-18 DIAGNOSIS — M6281 Muscle weakness (generalized): Secondary | ICD-10-CM | POA: Diagnosis not present

## 2014-08-18 DIAGNOSIS — R262 Difficulty in walking, not elsewhere classified: Secondary | ICD-10-CM | POA: Diagnosis not present

## 2014-08-19 DIAGNOSIS — R262 Difficulty in walking, not elsewhere classified: Secondary | ICD-10-CM | POA: Diagnosis not present

## 2014-08-19 DIAGNOSIS — R278 Other lack of coordination: Secondary | ICD-10-CM | POA: Diagnosis not present

## 2014-08-19 DIAGNOSIS — M6281 Muscle weakness (generalized): Secondary | ICD-10-CM | POA: Diagnosis not present

## 2014-08-20 DIAGNOSIS — M6281 Muscle weakness (generalized): Secondary | ICD-10-CM | POA: Diagnosis not present

## 2014-08-20 DIAGNOSIS — R262 Difficulty in walking, not elsewhere classified: Secondary | ICD-10-CM | POA: Diagnosis not present

## 2014-08-20 DIAGNOSIS — R278 Other lack of coordination: Secondary | ICD-10-CM | POA: Diagnosis not present

## 2014-08-21 DIAGNOSIS — M7989 Other specified soft tissue disorders: Secondary | ICD-10-CM | POA: Diagnosis not present

## 2014-08-21 DIAGNOSIS — M79659 Pain in unspecified thigh: Secondary | ICD-10-CM | POA: Diagnosis not present

## 2014-08-21 DIAGNOSIS — R52 Pain, unspecified: Secondary | ICD-10-CM | POA: Diagnosis not present

## 2014-08-24 DIAGNOSIS — M6281 Muscle weakness (generalized): Secondary | ICD-10-CM | POA: Diagnosis not present

## 2014-08-24 DIAGNOSIS — R278 Other lack of coordination: Secondary | ICD-10-CM | POA: Diagnosis not present

## 2014-08-24 DIAGNOSIS — R262 Difficulty in walking, not elsewhere classified: Secondary | ICD-10-CM | POA: Diagnosis not present

## 2014-08-25 DIAGNOSIS — R278 Other lack of coordination: Secondary | ICD-10-CM | POA: Diagnosis not present

## 2014-08-25 DIAGNOSIS — R262 Difficulty in walking, not elsewhere classified: Secondary | ICD-10-CM | POA: Diagnosis not present

## 2014-08-25 DIAGNOSIS — M6281 Muscle weakness (generalized): Secondary | ICD-10-CM | POA: Diagnosis not present

## 2014-08-26 DIAGNOSIS — M6281 Muscle weakness (generalized): Secondary | ICD-10-CM | POA: Diagnosis not present

## 2014-08-26 DIAGNOSIS — R278 Other lack of coordination: Secondary | ICD-10-CM | POA: Diagnosis not present

## 2014-08-26 DIAGNOSIS — R262 Difficulty in walking, not elsewhere classified: Secondary | ICD-10-CM | POA: Diagnosis not present

## 2014-08-27 DIAGNOSIS — R278 Other lack of coordination: Secondary | ICD-10-CM | POA: Diagnosis not present

## 2014-08-27 DIAGNOSIS — M6281 Muscle weakness (generalized): Secondary | ICD-10-CM | POA: Diagnosis not present

## 2014-08-27 DIAGNOSIS — R262 Difficulty in walking, not elsewhere classified: Secondary | ICD-10-CM | POA: Diagnosis not present

## 2014-08-28 DIAGNOSIS — I10 Essential (primary) hypertension: Secondary | ICD-10-CM | POA: Diagnosis not present

## 2014-08-28 DIAGNOSIS — F419 Anxiety disorder, unspecified: Secondary | ICD-10-CM | POA: Diagnosis not present

## 2014-08-28 DIAGNOSIS — E785 Hyperlipidemia, unspecified: Secondary | ICD-10-CM | POA: Diagnosis not present

## 2014-08-28 DIAGNOSIS — M6281 Muscle weakness (generalized): Secondary | ICD-10-CM | POA: Diagnosis not present

## 2014-08-28 DIAGNOSIS — G309 Alzheimer's disease, unspecified: Secondary | ICD-10-CM | POA: Diagnosis not present

## 2014-08-28 DIAGNOSIS — K219 Gastro-esophageal reflux disease without esophagitis: Secondary | ICD-10-CM | POA: Diagnosis not present

## 2014-08-28 DIAGNOSIS — R262 Difficulty in walking, not elsewhere classified: Secondary | ICD-10-CM | POA: Diagnosis not present

## 2014-08-28 DIAGNOSIS — R278 Other lack of coordination: Secondary | ICD-10-CM | POA: Diagnosis not present

## 2014-08-28 DIAGNOSIS — G894 Chronic pain syndrome: Secondary | ICD-10-CM | POA: Diagnosis not present

## 2014-08-28 DIAGNOSIS — I1310 Hypertensive heart and chronic kidney disease without heart failure, with stage 1 through stage 4 chronic kidney disease, or unspecified chronic kidney disease: Secondary | ICD-10-CM | POA: Diagnosis not present

## 2014-08-31 DIAGNOSIS — R278 Other lack of coordination: Secondary | ICD-10-CM | POA: Diagnosis not present

## 2014-08-31 DIAGNOSIS — M6281 Muscle weakness (generalized): Secondary | ICD-10-CM | POA: Diagnosis not present

## 2014-08-31 DIAGNOSIS — R262 Difficulty in walking, not elsewhere classified: Secondary | ICD-10-CM | POA: Diagnosis not present

## 2014-09-01 DIAGNOSIS — R262 Difficulty in walking, not elsewhere classified: Secondary | ICD-10-CM | POA: Diagnosis not present

## 2014-09-01 DIAGNOSIS — R278 Other lack of coordination: Secondary | ICD-10-CM | POA: Diagnosis not present

## 2014-09-01 DIAGNOSIS — M6281 Muscle weakness (generalized): Secondary | ICD-10-CM | POA: Diagnosis not present

## 2014-09-02 DIAGNOSIS — R262 Difficulty in walking, not elsewhere classified: Secondary | ICD-10-CM | POA: Diagnosis not present

## 2014-09-02 DIAGNOSIS — R278 Other lack of coordination: Secondary | ICD-10-CM | POA: Diagnosis not present

## 2014-09-02 DIAGNOSIS — M6281 Muscle weakness (generalized): Secondary | ICD-10-CM | POA: Diagnosis not present

## 2014-09-03 DIAGNOSIS — R278 Other lack of coordination: Secondary | ICD-10-CM | POA: Diagnosis not present

## 2014-09-03 DIAGNOSIS — M6281 Muscle weakness (generalized): Secondary | ICD-10-CM | POA: Diagnosis not present

## 2014-09-03 DIAGNOSIS — R262 Difficulty in walking, not elsewhere classified: Secondary | ICD-10-CM | POA: Diagnosis not present

## 2014-09-04 DIAGNOSIS — R278 Other lack of coordination: Secondary | ICD-10-CM | POA: Diagnosis not present

## 2014-09-04 DIAGNOSIS — R262 Difficulty in walking, not elsewhere classified: Secondary | ICD-10-CM | POA: Diagnosis not present

## 2014-09-04 DIAGNOSIS — M6281 Muscle weakness (generalized): Secondary | ICD-10-CM | POA: Diagnosis not present

## 2014-09-08 DIAGNOSIS — R262 Difficulty in walking, not elsewhere classified: Secondary | ICD-10-CM | POA: Diagnosis not present

## 2014-09-08 DIAGNOSIS — H4011X3 Primary open-angle glaucoma, severe stage: Secondary | ICD-10-CM | POA: Diagnosis not present

## 2014-09-08 DIAGNOSIS — R278 Other lack of coordination: Secondary | ICD-10-CM | POA: Diagnosis not present

## 2014-09-08 DIAGNOSIS — M6281 Muscle weakness (generalized): Secondary | ICD-10-CM | POA: Diagnosis not present

## 2014-09-10 DIAGNOSIS — R278 Other lack of coordination: Secondary | ICD-10-CM | POA: Diagnosis not present

## 2014-09-10 DIAGNOSIS — R262 Difficulty in walking, not elsewhere classified: Secondary | ICD-10-CM | POA: Diagnosis not present

## 2014-09-10 DIAGNOSIS — M6281 Muscle weakness (generalized): Secondary | ICD-10-CM | POA: Diagnosis not present

## 2014-09-11 DIAGNOSIS — R262 Difficulty in walking, not elsewhere classified: Secondary | ICD-10-CM | POA: Diagnosis not present

## 2014-09-11 DIAGNOSIS — M6281 Muscle weakness (generalized): Secondary | ICD-10-CM | POA: Diagnosis not present

## 2014-09-11 DIAGNOSIS — R278 Other lack of coordination: Secondary | ICD-10-CM | POA: Diagnosis not present

## 2014-09-15 DIAGNOSIS — M6281 Muscle weakness (generalized): Secondary | ICD-10-CM | POA: Diagnosis not present

## 2014-09-15 DIAGNOSIS — R278 Other lack of coordination: Secondary | ICD-10-CM | POA: Diagnosis not present

## 2014-09-15 DIAGNOSIS — R262 Difficulty in walking, not elsewhere classified: Secondary | ICD-10-CM | POA: Diagnosis not present

## 2014-09-16 DIAGNOSIS — R262 Difficulty in walking, not elsewhere classified: Secondary | ICD-10-CM | POA: Diagnosis not present

## 2014-09-16 DIAGNOSIS — M6281 Muscle weakness (generalized): Secondary | ICD-10-CM | POA: Diagnosis not present

## 2014-09-16 DIAGNOSIS — R278 Other lack of coordination: Secondary | ICD-10-CM | POA: Diagnosis not present

## 2014-09-17 DIAGNOSIS — M6281 Muscle weakness (generalized): Secondary | ICD-10-CM | POA: Diagnosis not present

## 2014-09-17 DIAGNOSIS — R262 Difficulty in walking, not elsewhere classified: Secondary | ICD-10-CM | POA: Diagnosis not present

## 2014-09-17 DIAGNOSIS — B351 Tinea unguium: Secondary | ICD-10-CM | POA: Diagnosis not present

## 2014-09-17 DIAGNOSIS — R278 Other lack of coordination: Secondary | ICD-10-CM | POA: Diagnosis not present

## 2014-09-17 DIAGNOSIS — N183 Chronic kidney disease, stage 3 (moderate): Secondary | ICD-10-CM | POA: Diagnosis not present

## 2014-09-17 DIAGNOSIS — D649 Anemia, unspecified: Secondary | ICD-10-CM | POA: Diagnosis not present

## 2014-09-17 DIAGNOSIS — L84 Corns and callosities: Secondary | ICD-10-CM | POA: Diagnosis not present

## 2014-09-18 DIAGNOSIS — R262 Difficulty in walking, not elsewhere classified: Secondary | ICD-10-CM | POA: Diagnosis not present

## 2014-09-18 DIAGNOSIS — M6281 Muscle weakness (generalized): Secondary | ICD-10-CM | POA: Diagnosis not present

## 2014-09-18 DIAGNOSIS — R278 Other lack of coordination: Secondary | ICD-10-CM | POA: Diagnosis not present

## 2014-09-21 DIAGNOSIS — M6281 Muscle weakness (generalized): Secondary | ICD-10-CM | POA: Diagnosis not present

## 2014-09-21 DIAGNOSIS — R278 Other lack of coordination: Secondary | ICD-10-CM | POA: Diagnosis not present

## 2014-09-21 DIAGNOSIS — R262 Difficulty in walking, not elsewhere classified: Secondary | ICD-10-CM | POA: Diagnosis not present

## 2014-09-22 DIAGNOSIS — M6281 Muscle weakness (generalized): Secondary | ICD-10-CM | POA: Diagnosis not present

## 2014-09-22 DIAGNOSIS — R262 Difficulty in walking, not elsewhere classified: Secondary | ICD-10-CM | POA: Diagnosis not present

## 2014-09-22 DIAGNOSIS — R278 Other lack of coordination: Secondary | ICD-10-CM | POA: Diagnosis not present

## 2014-09-23 DIAGNOSIS — R278 Other lack of coordination: Secondary | ICD-10-CM | POA: Diagnosis not present

## 2014-09-23 DIAGNOSIS — M6281 Muscle weakness (generalized): Secondary | ICD-10-CM | POA: Diagnosis not present

## 2014-09-23 DIAGNOSIS — R262 Difficulty in walking, not elsewhere classified: Secondary | ICD-10-CM | POA: Diagnosis not present

## 2014-09-24 DIAGNOSIS — R262 Difficulty in walking, not elsewhere classified: Secondary | ICD-10-CM | POA: Diagnosis not present

## 2014-09-24 DIAGNOSIS — R278 Other lack of coordination: Secondary | ICD-10-CM | POA: Diagnosis not present

## 2014-09-24 DIAGNOSIS — M6281 Muscle weakness (generalized): Secondary | ICD-10-CM | POA: Diagnosis not present

## 2014-09-25 DIAGNOSIS — M6281 Muscle weakness (generalized): Secondary | ICD-10-CM | POA: Diagnosis not present

## 2014-09-25 DIAGNOSIS — R278 Other lack of coordination: Secondary | ICD-10-CM | POA: Diagnosis not present

## 2014-09-25 DIAGNOSIS — R262 Difficulty in walking, not elsewhere classified: Secondary | ICD-10-CM | POA: Diagnosis not present

## 2014-09-28 DIAGNOSIS — R262 Difficulty in walking, not elsewhere classified: Secondary | ICD-10-CM | POA: Diagnosis not present

## 2014-09-28 DIAGNOSIS — R278 Other lack of coordination: Secondary | ICD-10-CM | POA: Diagnosis not present

## 2014-09-28 DIAGNOSIS — M6281 Muscle weakness (generalized): Secondary | ICD-10-CM | POA: Diagnosis not present

## 2014-09-30 DIAGNOSIS — R278 Other lack of coordination: Secondary | ICD-10-CM | POA: Diagnosis not present

## 2014-09-30 DIAGNOSIS — M6281 Muscle weakness (generalized): Secondary | ICD-10-CM | POA: Diagnosis not present

## 2014-09-30 DIAGNOSIS — R262 Difficulty in walking, not elsewhere classified: Secondary | ICD-10-CM | POA: Diagnosis not present

## 2014-10-01 DIAGNOSIS — M6281 Muscle weakness (generalized): Secondary | ICD-10-CM | POA: Diagnosis not present

## 2014-10-01 DIAGNOSIS — R278 Other lack of coordination: Secondary | ICD-10-CM | POA: Diagnosis not present

## 2014-10-01 DIAGNOSIS — R262 Difficulty in walking, not elsewhere classified: Secondary | ICD-10-CM | POA: Diagnosis not present

## 2014-10-02 DIAGNOSIS — M6281 Muscle weakness (generalized): Secondary | ICD-10-CM | POA: Diagnosis not present

## 2014-10-02 DIAGNOSIS — R278 Other lack of coordination: Secondary | ICD-10-CM | POA: Diagnosis not present

## 2014-10-02 DIAGNOSIS — R262 Difficulty in walking, not elsewhere classified: Secondary | ICD-10-CM | POA: Diagnosis not present

## 2014-10-06 DIAGNOSIS — R262 Difficulty in walking, not elsewhere classified: Secondary | ICD-10-CM | POA: Diagnosis not present

## 2014-10-06 DIAGNOSIS — M6281 Muscle weakness (generalized): Secondary | ICD-10-CM | POA: Diagnosis not present

## 2014-10-06 DIAGNOSIS — R278 Other lack of coordination: Secondary | ICD-10-CM | POA: Diagnosis not present

## 2014-10-07 DIAGNOSIS — R262 Difficulty in walking, not elsewhere classified: Secondary | ICD-10-CM | POA: Diagnosis not present

## 2014-10-07 DIAGNOSIS — M6281 Muscle weakness (generalized): Secondary | ICD-10-CM | POA: Diagnosis not present

## 2014-10-07 DIAGNOSIS — R278 Other lack of coordination: Secondary | ICD-10-CM | POA: Diagnosis not present

## 2014-10-08 DIAGNOSIS — M6281 Muscle weakness (generalized): Secondary | ICD-10-CM | POA: Diagnosis not present

## 2014-10-08 DIAGNOSIS — R262 Difficulty in walking, not elsewhere classified: Secondary | ICD-10-CM | POA: Diagnosis not present

## 2014-10-08 DIAGNOSIS — R278 Other lack of coordination: Secondary | ICD-10-CM | POA: Diagnosis not present

## 2014-10-09 DIAGNOSIS — R278 Other lack of coordination: Secondary | ICD-10-CM | POA: Diagnosis not present

## 2014-10-09 DIAGNOSIS — R262 Difficulty in walking, not elsewhere classified: Secondary | ICD-10-CM | POA: Diagnosis not present

## 2014-10-09 DIAGNOSIS — M6281 Muscle weakness (generalized): Secondary | ICD-10-CM | POA: Diagnosis not present

## 2014-10-12 DIAGNOSIS — R262 Difficulty in walking, not elsewhere classified: Secondary | ICD-10-CM | POA: Diagnosis not present

## 2014-10-12 DIAGNOSIS — R278 Other lack of coordination: Secondary | ICD-10-CM | POA: Diagnosis not present

## 2014-10-12 DIAGNOSIS — M6281 Muscle weakness (generalized): Secondary | ICD-10-CM | POA: Diagnosis not present

## 2014-10-13 ENCOUNTER — Emergency Department (HOSPITAL_COMMUNITY): Payer: Medicare Other

## 2014-10-13 ENCOUNTER — Emergency Department (HOSPITAL_COMMUNITY)
Admission: EM | Admit: 2014-10-13 | Discharge: 2014-10-13 | Disposition: A | Discharge status: Home / Self Care | Payer: Medicare Other | Attending: Emergency Medicine | Admitting: Emergency Medicine

## 2014-10-13 ENCOUNTER — Encounter (HOSPITAL_COMMUNITY): Payer: Self-pay | Admitting: *Deleted

## 2014-10-13 DIAGNOSIS — Z872 Personal history of diseases of the skin and subcutaneous tissue: Secondary | ICD-10-CM | POA: Insufficient documentation

## 2014-10-13 DIAGNOSIS — H409 Unspecified glaucoma: Secondary | ICD-10-CM | POA: Insufficient documentation

## 2014-10-13 DIAGNOSIS — S4992XA Unspecified injury of left shoulder and upper arm, initial encounter: Secondary | ICD-10-CM | POA: Insufficient documentation

## 2014-10-13 DIAGNOSIS — Z8739 Personal history of other diseases of the musculoskeletal system and connective tissue: Secondary | ICD-10-CM | POA: Diagnosis not present

## 2014-10-13 DIAGNOSIS — D649 Anemia, unspecified: Secondary | ICD-10-CM | POA: Insufficient documentation

## 2014-10-13 DIAGNOSIS — S3992XA Unspecified injury of lower back, initial encounter: Secondary | ICD-10-CM | POA: Diagnosis not present

## 2014-10-13 DIAGNOSIS — Z79899 Other long term (current) drug therapy: Secondary | ICD-10-CM | POA: Insufficient documentation

## 2014-10-13 DIAGNOSIS — W19XXXA Unspecified fall, initial encounter: Secondary | ICD-10-CM

## 2014-10-13 DIAGNOSIS — M25551 Pain in right hip: Secondary | ICD-10-CM | POA: Diagnosis not present

## 2014-10-13 DIAGNOSIS — Z853 Personal history of malignant neoplasm of breast: Secondary | ICD-10-CM | POA: Insufficient documentation

## 2014-10-13 DIAGNOSIS — K219 Gastro-esophageal reflux disease without esophagitis: Secondary | ICD-10-CM | POA: Diagnosis not present

## 2014-10-13 DIAGNOSIS — R531 Weakness: Secondary | ICD-10-CM | POA: Diagnosis not present

## 2014-10-13 DIAGNOSIS — F0390 Unspecified dementia without behavioral disturbance: Secondary | ICD-10-CM | POA: Insufficient documentation

## 2014-10-13 DIAGNOSIS — Z862 Personal history of diseases of the blood and blood-forming organs and certain disorders involving the immune mechanism: Secondary | ICD-10-CM | POA: Diagnosis not present

## 2014-10-13 DIAGNOSIS — Z8709 Personal history of other diseases of the respiratory system: Secondary | ICD-10-CM | POA: Diagnosis not present

## 2014-10-13 DIAGNOSIS — M5489 Other dorsalgia: Secondary | ICD-10-CM | POA: Diagnosis not present

## 2014-10-13 DIAGNOSIS — S4991XA Unspecified injury of right shoulder and upper arm, initial encounter: Secondary | ICD-10-CM | POA: Diagnosis not present

## 2014-10-13 DIAGNOSIS — S199XXA Unspecified injury of neck, initial encounter: Secondary | ICD-10-CM | POA: Insufficient documentation

## 2014-10-13 DIAGNOSIS — S79911A Unspecified injury of right hip, initial encounter: Secondary | ICD-10-CM | POA: Insufficient documentation

## 2014-10-13 DIAGNOSIS — W050XXA Fall from non-moving wheelchair, initial encounter: Secondary | ICD-10-CM | POA: Diagnosis not present

## 2014-10-13 DIAGNOSIS — Y9389 Activity, other specified: Secondary | ICD-10-CM | POA: Diagnosis not present

## 2014-10-13 DIAGNOSIS — F329 Major depressive disorder, single episode, unspecified: Secondary | ICD-10-CM | POA: Insufficient documentation

## 2014-10-13 DIAGNOSIS — E538 Deficiency of other specified B group vitamins: Secondary | ICD-10-CM | POA: Diagnosis not present

## 2014-10-13 DIAGNOSIS — Y92128 Other place in nursing home as the place of occurrence of the external cause: Secondary | ICD-10-CM | POA: Insufficient documentation

## 2014-10-13 DIAGNOSIS — T148 Other injury of unspecified body region: Secondary | ICD-10-CM | POA: Diagnosis not present

## 2014-10-13 DIAGNOSIS — Y998 Other external cause status: Secondary | ICD-10-CM | POA: Insufficient documentation

## 2014-10-13 DIAGNOSIS — N183 Chronic kidney disease, stage 3 (moderate): Secondary | ICD-10-CM | POA: Insufficient documentation

## 2014-10-13 DIAGNOSIS — I129 Hypertensive chronic kidney disease with stage 1 through stage 4 chronic kidney disease, or unspecified chronic kidney disease: Secondary | ICD-10-CM | POA: Insufficient documentation

## 2014-10-13 DIAGNOSIS — M542 Cervicalgia: Secondary | ICD-10-CM | POA: Diagnosis not present

## 2014-10-13 DIAGNOSIS — M549 Dorsalgia, unspecified: Secondary | ICD-10-CM | POA: Diagnosis not present

## 2014-10-13 DIAGNOSIS — S29092A Other injury of muscle and tendon of back wall of thorax, initial encounter: Secondary | ICD-10-CM | POA: Insufficient documentation

## 2014-10-13 DIAGNOSIS — M546 Pain in thoracic spine: Secondary | ICD-10-CM | POA: Diagnosis not present

## 2014-10-13 DIAGNOSIS — M25512 Pain in left shoulder: Secondary | ICD-10-CM | POA: Diagnosis not present

## 2014-10-13 LAB — URINALYSIS, ROUTINE W REFLEX MICROSCOPIC
Bilirubin Urine: NEGATIVE
Glucose, UA: NEGATIVE mg/dL
Hgb urine dipstick: NEGATIVE
Ketones, ur: NEGATIVE mg/dL
LEUKOCYTES UA: NEGATIVE
NITRITE: NEGATIVE
PROTEIN: NEGATIVE mg/dL
SPECIFIC GRAVITY, URINE: 1.015 (ref 1.005–1.030)
UROBILINOGEN UA: 0.2 mg/dL (ref 0.0–1.0)
pH: 7 (ref 5.0–8.0)

## 2014-10-13 MED ORDER — ACETAMINOPHEN 325 MG PO TABS: 650.0000 mg | ORAL_TABLET | Freq: Once | ORAL | Status: DC

## 2014-10-13 NOTE — ED Notes (Signed)
[id number] french

## 2014-10-13 NOTE — Discharge Instructions (Signed)
Return for worsening symptoms, including confusion, worsening pain, inability to walk, or any other symptoms concerning to you.  Fall Prevention and Home Safety Falls cause injuries and can affect all age groups. It is possible to prevent falls.  HOW TO PREVENT FALLS  Wear shoes with rubber soles that do not have an opening for your toes.  Keep the inside and outside of your house well lit.  Use night lights throughout your home.  Remove clutter from floors.  Clean up floor spills.  Remove throw rugs or fasten them to the floor with carpet tape.  Do not place electrical cords across pathways.  Put grab bars by your tub, shower, and toilet. Do not use towel bars as grab bars.  Put handrails on both sides of the stairway. Fix loose handrails.  Do not climb on stools or stepladders, if possible.  Do not wax your floors.  Repair uneven or unsafe sidewalks, walkways, or stairs.  Keep items you use a lot within reach.  Be aware of pets.  Keep emergency numbers next to the telephone.  Put smoke detectors in your home and near bedrooms. Ask your doctor what other things you can do to prevent falls. Document Released: 11/12/2008 Document Revised: 07/18/2011 Document Reviewed: 04/18/2011 [facility] Patient Information 2015 [place], [place]. This information is not intended to replace advice given to you by your health care provider. Make sure you discuss any questions you have with your health care provider.

## 2014-10-13 NOTE — ED Provider Notes (Signed)
CSN: [id number]     Arrival date & time 10/13/14  7829 History   First MD Initiated Contact with Patient 10/13/14 [phone]     Chief Complaint  Patient presents with  . Fall     (Consider location/radiation/quality/duration/timing/severity/associated sxs/prior Treatment) HPI History obtained via french interpreter. 81 year old female who presents after fall. History of HTN, HLD, IBS, and early Dementia. Trying to transfer into her chair from bed this morning and fell on her back. Did not have syncope or LOC. Reports otherwise being in her usual state of health. Complains of upper back pain, neck pain, right shoulder pain, and right hip pain. Unsure of hitting her head but does not think so.    Past Medical History  Diagnosis Date  . Hypertension   . Hyperlipidemia LDL goal < 100   . Polymyalgia rheumatica   . GERD (gastroesophageal reflux disease)   . Irritable bowel syndrome   . Diverticulitis   . Sciatica   . Vertigo   . Edema of leg   . Glaucoma   . Appendicitis   . Arthritis   . Acute upper respiratory infections of unspecified site   . Urge incontinence   . Other specified symptom associated with female genital organs   . Regional enteritis of unspecified site   . Sebaceous cyst   . Depression   . Other malaise and fatigue   . Abnormal weight gain   . Leukocytopenia, unspecified   . Dementia in conditions classified elsewhere without behavioral disturbance   . Anemia, unspecified   . Chronic kidney disease, stage III (moderate)   . Unspecified arthropathy, shoulder region   . Other B-complex deficiencies   . Unspecified vitamin D deficiency   . Memory loss   . Abnormality of gait   . Personal history of fall   . Malignant neoplasm of breast (female), unspecified site   . Unspecified glaucoma   . Diverticulosis of colon (without mention of hemorrhage)   . Irritable bowel syndrome   . Sciatica   . Dizziness and giddiness    Past Surgical History  Procedure  Laterality Date  . Nasal sinus surgery  1966  . Cholecystectomy  1972  . Cataract extraction  1990  . Breast surgery      R breast lumpectomy for benign disease  . Breast lumpectomy  10/28/10    left breast    Family History  Problem Relation Age of Onset  . Stroke Father   . Hypertension Mother   . Stroke Mother   . Breast cancer Mother   . Heart attack Brother   . Cancer Brother   . Stroke Brother   . Heart attack Son   . Colon cancer Sister    Social History  Substance Use Topics  . Smoking status: Never Smoker   . Smokeless tobacco: Never Used  . Alcohol Use: No   OB History    No data available     Review of Systems 10/14 systems reviewed and are negative other than those stated in the HPI    Allergies  Codeine  Home Medications   Prior to Admission medications   Medication Sig Start Date End Date Taking? Authorizing Provider  acetaminophen (TYLENOL) 500 MG tablet Take 500 mg by mouth every 6 (six) hours as needed for mild pain or headache.   Yes Historical Provider, MD  amLODipine (NORVASC) 5 MG tablet Take 7.5 mg by mouth daily.   Yes Historical Provider, MD  benzonatate (TESSALON  PERLES) 100 MG capsule Take 1 capsule (100 mg total) by mouth 3 (three) times daily as needed for cough. 02/13/14  Yes Monica Carter, DO  bimatoprost (LUMIGAN) 0.01 % SOLN Place 1 drop into both eyes at bedtime.    Yes Historical Provider, MD  brimonidine-timolol (COMBIGAN) 0.2-0.5 % ophthalmic solution Place 1 drop into both eyes 2 (two) times daily.    Yes Historical Provider, MD  calcium carbonate 1250 MG capsule Take 1,250 mg by mouth daily.   Yes Historical Provider, MD  citalopram (CELEXA) 10 MG tablet Take 1 tablet (10 mg total) by mouth daily. 03/29/14  Yes Christina P Rama, MD  dorzolamide (TRUSOPT) 2 % ophthalmic solution Place 1 drop into both eyes 2 (two) times daily. 02/13/14  Yes Estill Dooms, MD  ferrous sulfate 325 (65 FE) MG tablet Take 325 mg by mouth daily with  breakfast.   Yes Historical Provider, MD  guaiFENesin (ROBITUSSIN) 100 MG/5ML liquid Take 600 mg by mouth 3 (three) times daily as needed for cough.   Yes Historical Provider, MD  lidocaine (LIDODERM) 5 % APPLY 1 PATCH TO EACH SHOULDER , KEEP ON 12 HOURS, THEN KEEP OFF FOR 12 HOURS. 02/13/14  Yes Estill Dooms, MD  Multiple Vitamin (MULTIVITAMIN WITH MINERALS) TABS Take 1 tablet by mouth daily with breakfast.    Yes Historical Provider, MD  ranitidine (ZANTAC) 150 MG tablet Take 150 mg by mouth every morning.   Yes Historical Provider, MD  amLODipine (NORVASC) 10 MG tablet Take 1 tablet (10 mg total) by mouth daily with breakfast. Patient not taking: Reported on 10/13/2014 03/29/14   [name], MD  traMADol (ULTRAM) 50 MG tablet TAKE 1 TABLET 3 TIMES DAILY AS NEEDED FOR PAIN. Patient not taking: Reported on 10/13/2014 01/13/14   Tiffany L Reed, DO   BP 180/65 mmHg  Pulse 58  Temp(Src) 98.3 F (36.8 C) (Oral)  Resp 20  SpO2 97% Physical Exam Physical Exam  Nursing note and vitals reviewed. Constitutional: Well developed, well nourished, non-toxic, and in no acute distress Head: Normocephalic and atraumatic.  Mouth/Throat: Oropharynx is clear and moist.  Neck: cervical collar in place. Cardiovascular: Normal rate and regular rhythm. +2 radial and DP pulses bilaterally. Normal capillary refill   Pulmonary/Chest: Effort normal and breath sounds normal. No chest wall tenderness Abdominal: Soft. There is no tenderness. There is no rebound and no guarding.  Musculoskeletal: No deformities or swelling. Pain with palpation of upper back and neck diffusely. Pain with ROM of bilateral shoulders and right hip.  Neurological: Alert, no facial droop, fluent speech, moves all extremities symmetrically Skin: Skin is warm and dry.  Psychiatric: Cooperative  ED Course  Procedures (including critical care time) Labs Review Labs Reviewed - No data to display  Imaging Review Dg Thoracic Spine 2  View  10/13/2014   CLINICAL DATA:  Fall from bed today.  Upper back and neck pain.  EXAM: THORACIC SPINE 2 VIEWS  COMPARISON:  Chest radiographs 02/10/2014.  FINDINGS: There are 12 rib-bearing thoracic type vertebral bodies. The alignment is stable with a mild convex right scoliosis. There is multilevel spondylosis with paraspinal osteophytes asymmetric to the right. No evidence of acute fracture or paraspinal abnormality. Small cervical ribs are present bilaterally.  IMPRESSION: No evidence of acute thoracic spine injury. Grossly stable spondylosis.   Electronically Signed   By: Richardean Sale M.D.   On: 10/13/2014 11:51   Dg Lumbar Spine Complete  10/13/2014   CLINICAL DATA:  Fall,  trying to transfer to chair from bed this morning. Fell on back.  EXAM: LUMBAR SPINE - COMPLETE 4+ VIEW  COMPARISON:  None.  FINDINGS: Degenerative facet and disc disease throughout the lumbar spine. Slight anterior degenerative spurring. Normal alignment. No fracture. Aortic calcifications noted without visible aneurysm. SI joints are symmetric and unremarkable.  IMPRESSION: Spondylosis.  No acute findings.   Electronically Signed   By: Rolm Baptise M.D.   On: 10/13/2014 11:50   Dg Shoulder Right  10/13/2014   CLINICAL DATA:  Fall.  EXAM: RIGHT SHOULDER - 2+ VIEW  COMPARISON:  05/25/2010.  FINDINGS: Severe glenohumeral degenerative changes present. There is no evidence of acute fracture. No evidence of dislocation. Clavicles intact. Visualized ribs are intact. No pneumothorax .  IMPRESSION: Severe right glenohumeral degenerative change. No acute abnormality.   Electronically Signed   By: Marcello Moores  Register   On: 10/13/2014 11:52   Ct Head Wo Contrast  10/13/2014   CLINICAL DATA:  Fall. Dementia. No syncope or loss of consciousness. Cervicalgia/neck pain. Possible head trauma.  EXAM: CT HEAD WITHOUT CONTRAST  CT CERVICAL SPINE WITHOUT CONTRAST  TECHNIQUE: Multidetector CT imaging of the head and cervical spine was performed  following the standard protocol without intravenous contrast. Multiplanar CT image reconstructions of the cervical spine were also generated.  COMPARISON:  07/07/2014.  FINDINGS: CT HEAD FINDINGS  No mass lesion, mass effect, midline shift, hydrocephalus, hemorrhage. No acute territorial cortical ischemia/infarct. Atrophy and chronic ischemic white matter disease is present. The calvarium is intact. Mucous retention cyst/ polyp is in the LEFT maxillary sinus. RIGHT maxillary antrectomy with findings compatible with old chronic RIGHT maxillary sinusitis. Mastoid air cells are clear.  CT CERVICAL SPINE FINDINGS  Alignment: Unchanged, with a mild levoconvex torticollis although less pronounced than on prior exam. The changes probably positional.  Craniocervical junction: Atlantodental degenerative disease. Odontoid and occipital condyles intact.  Vertebrae: Negative for fracture. No aggressive osseous lesions. Ankylosis of the right-sided facet joints from C2 through C4 resulting in functional fusion at these levels.  Paraspinal soft tissues: Carotid atherosclerosis.  Thyroid goiter.  Lung apices: Normal.  Unchanged cervical spine degenerative disc and facet disease.  IMPRESSION: 1. Atrophy and chronic ischemic white matter disease without acute intracranial abnormality. 2. Unchanged cervical spine degenerative disease without an acute abnormality.   Electronically Signed   By: Dereck Ligas M.D.   On: 10/13/2014 10:21   Ct Cervical Spine Wo Contrast  10/13/2014   CLINICAL DATA:  Fall. Dementia. No syncope or loss of consciousness. Cervicalgia/neck pain. Possible head trauma.  EXAM: CT HEAD WITHOUT CONTRAST  CT CERVICAL SPINE WITHOUT CONTRAST  TECHNIQUE: Multidetector CT imaging of the head and cervical spine was performed following the standard protocol without intravenous contrast. Multiplanar CT image reconstructions of the cervical spine were also generated.  COMPARISON:  07/07/2014.  FINDINGS: CT HEAD  FINDINGS  No mass lesion, mass effect, midline shift, hydrocephalus, hemorrhage. No acute territorial cortical ischemia/infarct. Atrophy and chronic ischemic white matter disease is present. The calvarium is intact. Mucous retention cyst/ polyp is in the LEFT maxillary sinus. RIGHT maxillary antrectomy with findings compatible with old chronic RIGHT maxillary sinusitis. Mastoid air cells are clear.  CT CERVICAL SPINE FINDINGS  Alignment: Unchanged, with a mild levoconvex torticollis although less pronounced than on prior exam. The changes probably positional.  Craniocervical junction: Atlantodental degenerative disease. Odontoid and occipital condyles intact.  Vertebrae: Negative for fracture. No aggressive osseous lesions. Ankylosis of the right-sided facet joints from C2 through C4  resulting in functional fusion at these levels.  Paraspinal soft tissues: Carotid atherosclerosis.  Thyroid goiter.  Lung apices: Normal.  Unchanged cervical spine degenerative disc and facet disease.  IMPRESSION: 1. Atrophy and chronic ischemic white matter disease without acute intracranial abnormality. 2. Unchanged cervical spine degenerative disease without an acute abnormality.   Electronically Signed   By: Dereck Ligas M.D.   On: 10/13/2014 10:21   Dg Shoulder Left  10/13/2014   CLINICAL DATA:  Fall from bed today. Left shoulder pain. Initial encounter.  EXAM: LEFT SHOULDER - 2+ VIEW  COMPARISON:  05/02/2013 radiographs.  FINDINGS: The mineralization and alignment are normal. There is no evidence of acute fracture or dislocation. There are severe glenohumeral degenerative changes with joint space loss and osteophytes. The subacromial space is preserved. There is posttraumatic deformity of the distal clavicle which is suboptimally visualized on these views.  IMPRESSION: No acute osseous findings demonstrated. Posttraumatic deformity of the distal left clavicle and severe glenohumeral degenerative changes noted.    Electronically Signed   By: Richardean Sale M.D.   On: 10/13/2014 11:53   Dg Hip Unilat With Pelvis 2-3 Views Right  10/13/2014   CLINICAL DATA:  Acute right hip pain after fall from bed today. Initial encounter.  EXAM: DG HIP (WITH OR WITHOUT PELVIS) 2-3V RIGHT  COMPARISON:  None.  FINDINGS: There is no evidence of hip fracture or dislocation. There is no evidence of arthropathy or other focal bone abnormality.  IMPRESSION: Normal right hip.   Electronically Signed   By: Marijo Conception, M.D.   On: 10/13/2014 11:54   I have personally reviewed and evaluated these images and lab results as part of my medical decision-making.    MDM   Final diagnoses:  Fall, initial encounter      81 year old female who presents after mechanical fall. Does not take any anticoagulation. She is well-appearing, nontoxic and in no acute distress on presentation. No deformities, bruising, or swelling is noted on exam. She is in a cervical collar and complains of some paraspinal neck pain. CT head, cervical spine, x-ray of her thoracolumbar spine, x-ray shoulders, x-ray right hip and pelvis are performed, and she was complaining of pain in these areas. An imaging studies reviewed and visualized with radiology. There is no evidence of acute traumatic injuries. She is given Tylenol for pain control. I he is able to ambulate steadily here in the emergency department. Felt appropriate for discharge back to her facility. Strict return and follow-up instructions were reviewed with a [place] interpreter. She expressed understanding of all discharge instructions and felt comfortable to plan of care.   Forde Dandy, MD 10/13/14 [phone]

## 2014-10-13 NOTE — ED Notes (Addendum)
Patient is from [facility] assisted living.( paper work has facility address as [address] or [address]) Primary language is [place]. Staff contacted EMS d/t pt fall. Pt a&ox4, reports attempting to transfer from her bed to the wheelchair when she slipped to the floor landing onto her backside and then laid back. She denied head injury and or LOC. Pt c/o pain to neck, cervical, thoracic areas. No deformity noted.

## 2014-10-13 NOTE — ED Notes (Signed)
Report given to [place] at [facility] [phone]). Questions, concerns denied. Pt ambulatory with use of walker

## 2014-10-13 NOTE — ED Notes (Signed)
Pt's aide and Probation officer tried multiple times to get the pt to stand, with walker.   Aide feels confident in her abilities to go home even w/o ambulation and is at baseline.

## 2014-10-13 NOTE — ED Notes (Signed)
Bed: XI50 Expected date:  Expected time:  Means of arrival:  Comments: EMS- 81yo F, fell out of wheel chair/neck and back pain

## 2014-10-14 DIAGNOSIS — M6281 Muscle weakness (generalized): Secondary | ICD-10-CM | POA: Diagnosis not present

## 2014-10-14 DIAGNOSIS — R278 Other lack of coordination: Secondary | ICD-10-CM | POA: Diagnosis not present

## 2014-10-14 DIAGNOSIS — R262 Difficulty in walking, not elsewhere classified: Secondary | ICD-10-CM | POA: Diagnosis not present

## 2014-10-15 DIAGNOSIS — M6281 Muscle weakness (generalized): Secondary | ICD-10-CM | POA: Diagnosis not present

## 2014-10-15 DIAGNOSIS — R262 Difficulty in walking, not elsewhere classified: Secondary | ICD-10-CM | POA: Diagnosis not present

## 2014-10-15 DIAGNOSIS — R278 Other lack of coordination: Secondary | ICD-10-CM | POA: Diagnosis not present

## 2014-10-16 DIAGNOSIS — R262 Difficulty in walking, not elsewhere classified: Secondary | ICD-10-CM | POA: Diagnosis not present

## 2014-10-16 DIAGNOSIS — M6281 Muscle weakness (generalized): Secondary | ICD-10-CM | POA: Diagnosis not present

## 2014-10-16 DIAGNOSIS — R278 Other lack of coordination: Secondary | ICD-10-CM | POA: Diagnosis not present

## 2014-10-17 ENCOUNTER — Emergency Department (HOSPITAL_COMMUNITY): Payer: Medicare Other

## 2014-10-17 ENCOUNTER — Emergency Department (HOSPITAL_COMMUNITY)
Admission: EM | Admit: 2014-10-17 | Discharge: 2014-10-17 | Disposition: A | Discharge status: Skilled Nursing Facility | Payer: Medicare Other | Attending: Emergency Medicine | Admitting: Emergency Medicine

## 2014-10-17 DIAGNOSIS — D649 Anemia, unspecified: Secondary | ICD-10-CM | POA: Diagnosis not present

## 2014-10-17 DIAGNOSIS — M25519 Pain in unspecified shoulder: Secondary | ICD-10-CM | POA: Diagnosis not present

## 2014-10-17 DIAGNOSIS — M1990 Unspecified osteoarthritis, unspecified site: Secondary | ICD-10-CM | POA: Diagnosis not present

## 2014-10-17 DIAGNOSIS — S199XXA Unspecified injury of neck, initial encounter: Secondary | ICD-10-CM | POA: Diagnosis not present

## 2014-10-17 DIAGNOSIS — S3992XA Unspecified injury of lower back, initial encounter: Secondary | ICD-10-CM | POA: Diagnosis not present

## 2014-10-17 DIAGNOSIS — I129 Hypertensive chronic kidney disease with stage 1 through stage 4 chronic kidney disease, or unspecified chronic kidney disease: Secondary | ICD-10-CM | POA: Diagnosis not present

## 2014-10-17 DIAGNOSIS — M542 Cervicalgia: Secondary | ICD-10-CM | POA: Diagnosis not present

## 2014-10-17 DIAGNOSIS — W19XXXA Unspecified fall, initial encounter: Secondary | ICD-10-CM

## 2014-10-17 DIAGNOSIS — Y9389 Activity, other specified: Secondary | ICD-10-CM | POA: Insufficient documentation

## 2014-10-17 DIAGNOSIS — Z8639 Personal history of other endocrine, nutritional and metabolic disease: Secondary | ICD-10-CM | POA: Insufficient documentation

## 2014-10-17 DIAGNOSIS — N183 Chronic kidney disease, stage 3 (moderate): Secondary | ICD-10-CM | POA: Diagnosis not present

## 2014-10-17 DIAGNOSIS — Z872 Personal history of diseases of the skin and subcutaneous tissue: Secondary | ICD-10-CM | POA: Insufficient documentation

## 2014-10-17 DIAGNOSIS — Y998 Other external cause status: Secondary | ICD-10-CM | POA: Insufficient documentation

## 2014-10-17 DIAGNOSIS — Z853 Personal history of malignant neoplasm of breast: Secondary | ICD-10-CM | POA: Diagnosis not present

## 2014-10-17 DIAGNOSIS — Y92121 Bathroom in nursing home as the place of occurrence of the external cause: Secondary | ICD-10-CM | POA: Diagnosis not present

## 2014-10-17 DIAGNOSIS — F0390 Unspecified dementia without behavioral disturbance: Secondary | ICD-10-CM | POA: Insufficient documentation

## 2014-10-17 DIAGNOSIS — Z8709 Personal history of other diseases of the respiratory system: Secondary | ICD-10-CM | POA: Diagnosis not present

## 2014-10-17 DIAGNOSIS — K219 Gastro-esophageal reflux disease without esophagitis: Secondary | ICD-10-CM | POA: Insufficient documentation

## 2014-10-17 DIAGNOSIS — M545 Low back pain: Secondary | ICD-10-CM | POA: Diagnosis not present

## 2014-10-17 DIAGNOSIS — Z043 Encounter for examination and observation following other accident: Secondary | ICD-10-CM | POA: Diagnosis not present

## 2014-10-17 DIAGNOSIS — H409 Unspecified glaucoma: Secondary | ICD-10-CM | POA: Diagnosis not present

## 2014-10-17 DIAGNOSIS — Z79899 Other long term (current) drug therapy: Secondary | ICD-10-CM | POA: Diagnosis not present

## 2014-10-17 DIAGNOSIS — W1839XA Other fall on same level, initial encounter: Secondary | ICD-10-CM | POA: Diagnosis not present

## 2014-10-17 DIAGNOSIS — T148 Other injury of unspecified body region: Secondary | ICD-10-CM | POA: Diagnosis not present

## 2014-10-17 LAB — CBC WITH DIFFERENTIAL/PLATELET
BASOS ABS: 0 10*3/uL (ref 0.0–0.1)
BASOS PCT: 0 %
EOS ABS: 0 10*3/uL (ref 0.0–0.7)
EOS PCT: 1 %
HCT: 36.4 % (ref 36.0–46.0)
Hemoglobin: 11.6 g/dL — ABNORMAL LOW (ref 12.0–15.0)
LYMPHS PCT: 20 %
Lymphs Abs: 1.2 10*3/uL (ref 0.7–4.0)
MCH: 30.7 pg (ref 26.0–34.0)
MCHC: 31.9 g/dL (ref 30.0–36.0)
MCV: 96.3 fL (ref 78.0–100.0)
Monocytes Absolute: 0.3 10*3/uL (ref 0.1–1.0)
Monocytes Relative: 5 %
Neutro Abs: 4.4 10*3/uL (ref 1.7–7.7)
Neutrophils Relative %: 74 %
PLATELETS: 176 10*3/uL (ref 150–400)
RBC: 3.78 MIL/uL — AB (ref 3.87–5.11)
RDW: 13.3 % (ref 11.5–15.5)
WBC: 5.9 10*3/uL (ref 4.0–10.5)

## 2014-10-17 LAB — URINALYSIS, ROUTINE W REFLEX MICROSCOPIC
Bilirubin Urine: NEGATIVE
GLUCOSE, UA: NEGATIVE mg/dL
Ketones, ur: NEGATIVE mg/dL
LEUKOCYTES UA: NEGATIVE
Nitrite: NEGATIVE
PROTEIN: NEGATIVE mg/dL
SPECIFIC GRAVITY, URINE: 1.021 (ref 1.005–1.030)
Urobilinogen, UA: 1 mg/dL (ref 0.0–1.0)
pH: 7 (ref 5.0–8.0)

## 2014-10-17 LAB — COMPREHENSIVE METABOLIC PANEL
ALBUMIN: 3.9 g/dL (ref 3.5–5.0)
ALT: 16 U/L (ref 14–54)
AST: 29 U/L (ref 15–41)
Alkaline Phosphatase: 83 U/L (ref 38–126)
Anion gap: 7 (ref 5–15)
BUN: 33 mg/dL — AB (ref 6–20)
CHLORIDE: 109 mmol/L (ref 101–111)
CO2: 27 mmol/L (ref 22–32)
CREATININE: 1.62 mg/dL — AB (ref 0.44–1.00)
Calcium: 10.1 mg/dL (ref 8.9–10.3)
GFR calc Af Amer: 31 mL/min — ABNORMAL LOW (ref >60)
GFR calc non Af Amer: 26 mL/min — ABNORMAL LOW (ref >60)
GLUCOSE: 136 mg/dL — AB (ref 65–99)
POTASSIUM: 4.8 mmol/L (ref 3.5–5.1)
SODIUM: 143 mmol/L (ref 135–145)
Total Bilirubin: 0.5 mg/dL (ref 0.3–1.2)
Total Protein: 7.3 g/dL (ref 6.5–8.1)

## 2014-10-17 LAB — URINE MICROSCOPIC-ADD ON

## 2014-10-17 NOTE — Discharge Instructions (Signed)

## 2014-10-17 NOTE — ED Notes (Signed)
Pt transported to CT ?

## 2014-10-17 NOTE — ED Notes (Signed)
Pt returned from XRAY 

## 2014-10-17 NOTE — ED Notes (Signed)
Pt transported to XRAY °

## 2014-10-17 NOTE — ED Notes (Addendum)
Attempted to call [facility], however, the person answering the phones reports there are no nurses there until the morning. Pt reports she lives in the assisted living part and performs a lot of self care

## 2014-10-17 NOTE — ED Provider Notes (Signed)
CSN: [id number]     Arrival date & time 10/17/14  0024 History  This chart was scribed for Julianne Rice, MD by Irene Pap, ED Scribe. This patient was seen in room WA15/WA15 and patient care was started at 12:47 AM.   Chief Complaint  Patient presents with  . Fall   The history is provided by the patient. No language interpreter was used.   HPI Comments: Michelle [name] is a 81 y.o. Female with hx of abnormality of gait, hx of frequent falls, sciatica, vertigo, HTN, polymyalgia rheumatica and dementia brought in by EMS who presents to the Emergency Department complaining of a fall onset PTA. Per nurse, pt lives at [facility]. Pt fell trying to use the bathroom and signaled the nursing staff. Unwitnessed fall.  Per triage note, no reported LOC and pt is wearing C-collar upon arrival. Reports right-sided neck pain and low back pain. Pt was recently seen on 10/13/14 for similar symptoms.  Past Medical History  Diagnosis Date  . Hypertension   . Hyperlipidemia LDL goal < 100   . Polymyalgia rheumatica   . GERD (gastroesophageal reflux disease)   . Irritable bowel syndrome   . Diverticulitis   . Sciatica   . Vertigo   . Edema of leg   . Glaucoma   . Appendicitis   . Arthritis   . Acute upper respiratory infections of unspecified site   . Urge incontinence   . Other specified symptom associated with female genital organs   . Regional enteritis of unspecified site   . Sebaceous cyst   . Depression   . Other malaise and fatigue   . Abnormal weight gain   . Leukocytopenia, unspecified   . Dementia in conditions classified elsewhere without behavioral disturbance   . Anemia, unspecified   . Chronic kidney disease, stage III (moderate)   . Unspecified arthropathy, shoulder region   . Other B-complex deficiencies   . Unspecified vitamin D deficiency   . Memory loss   . Abnormality of gait   . Personal history of fall   . Malignant neoplasm of breast (female), unspecified  site   . Unspecified glaucoma   . Diverticulosis of colon (without mention of hemorrhage)   . Irritable bowel syndrome   . Sciatica   . Dizziness and giddiness    Past Surgical History  Procedure Laterality Date  . Nasal sinus surgery  1966  . Cholecystectomy  1972  . Cataract extraction  1990  . Breast surgery      R breast lumpectomy for benign disease  . Breast lumpectomy  10/28/10    left breast    Family History  Problem Relation Age of Onset  . Stroke Father   . Hypertension Mother   . Stroke Mother   . Breast cancer Mother   . Heart attack Brother   . Cancer Brother   . Stroke Brother   . Heart attack Son   . Colon cancer Sister    Social History  Substance Use Topics  . Smoking status: Never Smoker   . Smokeless tobacco: Never Used  . Alcohol Use: No   OB History    No data available     Review of Systems  Cardiovascular: Negative for chest pain.  Gastrointestinal: Negative for nausea, vomiting and abdominal pain.  Musculoskeletal: Positive for back pain and neck pain.  Skin: Negative for wound.  Neurological: Negative for dizziness, syncope, weakness, light-headedness and headaches.  All other systems reviewed and are negative.  Allergies  Codeine  Home Medications   Prior to Admission medications   Medication Sig Start Date End Date Taking? Authorizing Provider  acetaminophen (TYLENOL) 500 MG tablet Take 500 mg by mouth every 6 (six) hours as needed for mild pain or headache.   Yes Historical Provider, MD  amLODipine (NORVASC) 5 MG tablet Take 7.5 mg by mouth daily.   Yes Historical Provider, MD  benzonatate (TESSALON PERLES) 100 MG capsule Take 1 capsule (100 mg total) by mouth 3 (three) times daily as needed for cough. 02/13/14  Yes Monica Carter, DO  bimatoprost (LUMIGAN) 0.01 % SOLN Place 1 drop into both eyes at bedtime.    Yes Historical Provider, MD  brimonidine-timolol (COMBIGAN) 0.2-0.5 % ophthalmic solution Place 1 drop into both eyes  2 (two) times daily.    Yes Historical Provider, MD  calcium carbonate 1250 MG capsule Take 1,250 mg by mouth daily.   Yes Historical Provider, MD  citalopram (CELEXA) 10 MG tablet Take 1 tablet (10 mg total) by mouth daily. 03/29/14  Yes Christina P Rama, MD  dorzolamide (TRUSOPT) 2 % ophthalmic solution Place 1 drop into both eyes 2 (two) times daily. 02/13/14  Yes Estill Dooms, MD  ferrous sulfate 325 (65 FE) MG tablet Take 325 mg by mouth daily with breakfast.   Yes Historical Provider, MD  guaiFENesin (ROBITUSSIN) 100 MG/5ML liquid Take 600 mg by mouth 3 (three) times daily as needed for cough.   Yes Historical Provider, MD  lidocaine (LIDODERM) 5 % APPLY 1 PATCH TO EACH SHOULDER , KEEP ON 12 HOURS, THEN KEEP OFF FOR 12 HOURS. 02/13/14  Yes Estill Dooms, MD  Multiple Vitamin (MULTIVITAMIN WITH MINERALS) TABS Take 1 tablet by mouth daily with breakfast.    Yes Historical Provider, MD  ranitidine (ZANTAC) 150 MG tablet Take 150 mg by mouth every morning.   Yes Historical Provider, MD  amLODipine (NORVASC) 10 MG tablet Take 1 tablet (10 mg total) by mouth daily with breakfast. Patient not taking: Reported on 10/13/2014 03/29/14   [name], MD   BP 160/67 mmHg  Pulse 57  Temp(Src) 98.6 F (37 C) (Oral)  Resp 13  SpO2 96% Physical Exam  Constitutional: She appears well-developed and well-nourished. No distress.  HENT:  Head: Normocephalic and atraumatic.  Mouth/Throat: Oropharynx is clear and moist. No oropharyngeal exudate.  Eyes: EOM are normal. Pupils are equal, round, and reactive to light.  Neck:  Cervical collar in place. Patient has no posterior midline cervical tenderness. She does have right-sided paraspinal cervical tenderness. No obvious trauma.  Cardiovascular: Normal rate and regular rhythm.  Exam reveals no gallop and no friction rub.   No murmur heard. Pulmonary/Chest: Effort normal and breath sounds normal. No respiratory distress. She has no wheezes. She has no  rales. She exhibits no tenderness.  Abdominal: Soft. Bowel sounds are normal. She exhibits no distension and no mass. There is no tenderness. There is no rebound and no guarding.  Musculoskeletal: Normal range of motion. She exhibits tenderness. She exhibits no edema.  Patient with diffuse midline lumbar tenderness. There are no step-offs or deformity. No evidence of trauma. Pelvis stable. Distal pulses intact. Moves all joints.  Neurological: She is alert.  Moves all extremities without focal deficit. Sensation is grossly intact.  Skin: Skin is warm and dry. No rash noted. No erythema.  Psychiatric: She has a normal mood and affect. Her behavior is normal.  Nursing note and vitals reviewed.   ED Course  Procedures (including critical care time) DIAGNOSTIC STUDIES: Oxygen Saturation is 99% on RA, normal by my interpretation.    COORDINATION OF CARE: 12:50 AM-Discussed treatment plan which includes CXR, CBC panel, CMP, UA) with pt at bedside and pt agreed to plan.   Labs Review Labs Reviewed  CBC WITH DIFFERENTIAL/PLATELET - Abnormal; Notable for the following:    RBC 3.78 (*)    Hemoglobin 11.6 (*)    All other components within normal limits  COMPREHENSIVE METABOLIC PANEL - Abnormal; Notable for the following:    Glucose, Bld 136 (*)    BUN 33 (*)    Creatinine, Ser 1.62 (*)    GFR calc non Af Amer 26 (*)    GFR calc Af Amer 31 (*)    All other components within normal limits  URINALYSIS, ROUTINE W REFLEX MICROSCOPIC (NOT AT [facility]) - Abnormal; Notable for the following:    Hgb urine dipstick TRACE (*)    All other components within normal limits  URINE MICROSCOPIC-ADD ON - Abnormal; Notable for the following:    Squamous Epithelial / LPF FEW (*)    All other components within normal limits    Imaging Review Dg Lumbar Spine Complete  10/17/2014   CLINICAL DATA:  81 year old female with fall and generalized lower back pain  EXAM: LUMBAR SPINE - COMPLETE 4+ VIEW  COMPARISON:   Radiograph dated 10/13/2014  FINDINGS: There is severe osteopenia with diffuse degenerative changes of the spine. Mild lower thoracic compression deformity, appear stable compared to the prior study and likely old. Clinical correlation is recommended.  Atherosclerotic calcification of the aorta.  IMPRESSION: No definite acute/traumatic lumbar spine pathology.  Osteopenia.   Electronically Signed   By: Anner Crete M.D.   On: 10/17/2014 02:08   Ct Head Wo Contrast  10/17/2014   CLINICAL DATA:  81 year old female with fall  EXAM: CT HEAD WITHOUT CONTRAST  CT CERVICAL SPINE WITHOUT CONTRAST  TECHNIQUE: Multidetector CT imaging of the head and cervical spine was performed following the standard protocol without intravenous contrast. Multiplanar CT image reconstructions of the cervical spine were also generated.  COMPARISON:  CT dated 10/13/2014  FINDINGS: CT HEAD FINDINGS  The ventricles are dilated and the sulci are prominent compatible with age-related atrophy. Periventricular and deep white matter hypodensities represent chronic microvascular ischemic changes. There is no intracranial hemorrhage. No mass effect or midline shift identified.  There is stable chronic changes of the right maxillary sinus with mild mucoperiosteal thickening of paranasal sinuses. A small left maxillary sinus retention cyst or polyp noted. The mastoid air cells are well aerated. The calvarium is intact.  CT CERVICAL SPINE FINDINGS  There is no acute fracture or subluxation of the cervical spine.Osteopenia with degenerative changes.The odontoid and spinous processes are intact.There is normal anatomic alignment of the C1-C2 lateral masses. The visualized soft tissues appear unremarkable.  IMPRESSION: No acute intracranial pathology.  Age-related atrophy and chronic microvascular ischemic disease.  No acute/traumatic cervical spine pathology.   Electronically Signed   By: Anner Crete M.D.   On: 10/17/2014 03:07   Ct Cervical  Spine Wo Contrast  10/17/2014   CLINICAL DATA:  81 year old female with fall  EXAM: CT HEAD WITHOUT CONTRAST  CT CERVICAL SPINE WITHOUT CONTRAST  TECHNIQUE: Multidetector CT imaging of the head and cervical spine was performed following the standard protocol without intravenous contrast. Multiplanar CT image reconstructions of the cervical spine were also generated.  COMPARISON:  CT dated 10/13/2014  FINDINGS: CT HEAD FINDINGS  The ventricles are dilated and the sulci are prominent compatible with age-related atrophy. Periventricular and deep white matter hypodensities represent chronic microvascular ischemic changes. There is no intracranial hemorrhage. No mass effect or midline shift identified.  There is stable chronic changes of the right maxillary sinus with mild mucoperiosteal thickening of paranasal sinuses. A small left maxillary sinus retention cyst or polyp noted. The mastoid air cells are well aerated. The calvarium is intact.  CT CERVICAL SPINE FINDINGS  There is no acute fracture or subluxation of the cervical spine.Osteopenia with degenerative changes.The odontoid and spinous processes are intact.There is normal anatomic alignment of the C1-C2 lateral masses. The visualized soft tissues appear unremarkable.  IMPRESSION: No acute intracranial pathology.  Age-related atrophy and chronic microvascular ischemic disease.  No acute/traumatic cervical spine pathology.   Electronically Signed   By: Anner Crete M.D.   On: 10/17/2014 03:07     EKG Interpretation None      MDM   Final diagnoses:  Fall from standing, initial encounter    I personally performed the services described in this documentation, which was scribed in my presence. The recorded information has been reviewed and is accurate.  X-rays without any acute findings. Patient appears to be at her neurologic baseline. Will return patient back to assisted living facility.   Julianne Rice, MD 10/17/14 [phone]

## 2014-10-17 NOTE — ED Notes (Signed)
Pt from assisted living [facility] were staff found her on floor in [place] pt reports fall while attempting to sit on commode. No reported LOC C- collar inplace on arrival

## 2014-10-17 NOTE — ED Notes (Signed)
[facility] has been called  

## 2014-10-19 DIAGNOSIS — M6281 Muscle weakness (generalized): Secondary | ICD-10-CM | POA: Diagnosis not present

## 2014-10-19 DIAGNOSIS — R278 Other lack of coordination: Secondary | ICD-10-CM | POA: Diagnosis not present

## 2014-10-19 DIAGNOSIS — R262 Difficulty in walking, not elsewhere classified: Secondary | ICD-10-CM | POA: Diagnosis not present

## 2014-10-20 DIAGNOSIS — R278 Other lack of coordination: Secondary | ICD-10-CM | POA: Diagnosis not present

## 2014-10-20 DIAGNOSIS — R262 Difficulty in walking, not elsewhere classified: Secondary | ICD-10-CM | POA: Diagnosis not present

## 2014-10-20 DIAGNOSIS — M6281 Muscle weakness (generalized): Secondary | ICD-10-CM | POA: Diagnosis not present

## 2014-10-21 DIAGNOSIS — M19011 Primary osteoarthritis, right shoulder: Secondary | ICD-10-CM | POA: Diagnosis not present

## 2014-10-21 DIAGNOSIS — M25551 Pain in right hip: Secondary | ICD-10-CM | POA: Diagnosis not present

## 2014-10-21 DIAGNOSIS — M47816 Spondylosis without myelopathy or radiculopathy, lumbar region: Secondary | ICD-10-CM | POA: Diagnosis not present

## 2014-10-22 ENCOUNTER — Other Ambulatory Visit: Payer: Self-pay | Admitting: Internal Medicine

## 2014-10-22 DIAGNOSIS — M6281 Muscle weakness (generalized): Secondary | ICD-10-CM | POA: Diagnosis not present

## 2014-10-22 DIAGNOSIS — R278 Other lack of coordination: Secondary | ICD-10-CM | POA: Diagnosis not present

## 2014-10-22 DIAGNOSIS — R262 Difficulty in walking, not elsewhere classified: Secondary | ICD-10-CM | POA: Diagnosis not present

## 2014-10-26 DIAGNOSIS — M6281 Muscle weakness (generalized): Secondary | ICD-10-CM | POA: Diagnosis not present

## 2014-10-26 DIAGNOSIS — R278 Other lack of coordination: Secondary | ICD-10-CM | POA: Diagnosis not present

## 2014-10-26 DIAGNOSIS — R262 Difficulty in walking, not elsewhere classified: Secondary | ICD-10-CM | POA: Diagnosis not present

## 2014-10-27 DIAGNOSIS — M6281 Muscle weakness (generalized): Secondary | ICD-10-CM | POA: Diagnosis not present

## 2014-10-27 DIAGNOSIS — R278 Other lack of coordination: Secondary | ICD-10-CM | POA: Diagnosis not present

## 2014-10-27 DIAGNOSIS — R262 Difficulty in walking, not elsewhere classified: Secondary | ICD-10-CM | POA: Diagnosis not present

## 2014-10-29 ENCOUNTER — Other Ambulatory Visit: Payer: Self-pay | Admitting: Orthopaedic Surgery

## 2014-10-29 DIAGNOSIS — R262 Difficulty in walking, not elsewhere classified: Secondary | ICD-10-CM | POA: Diagnosis not present

## 2014-10-29 DIAGNOSIS — R278 Other lack of coordination: Secondary | ICD-10-CM | POA: Diagnosis not present

## 2014-10-29 DIAGNOSIS — M545 Low back pain: Secondary | ICD-10-CM

## 2014-10-29 DIAGNOSIS — M6281 Muscle weakness (generalized): Secondary | ICD-10-CM | POA: Diagnosis not present

## 2014-10-30 DIAGNOSIS — Z23 Encounter for immunization: Secondary | ICD-10-CM | POA: Diagnosis not present

## 2014-11-02 DIAGNOSIS — R278 Other lack of coordination: Secondary | ICD-10-CM | POA: Diagnosis not present

## 2014-11-04 DIAGNOSIS — R278 Other lack of coordination: Secondary | ICD-10-CM | POA: Diagnosis not present

## 2014-11-17 ENCOUNTER — Ambulatory Visit
Admission: RE | Admit: 2014-11-17 | Discharge: 2014-11-17 | Disposition: A | Discharge status: Home / Self Care | Payer: Medicare Other | Source: Ambulatory Visit | Attending: Orthopaedic Surgery | Admitting: Orthopaedic Surgery

## 2014-11-17 DIAGNOSIS — M5126 Other intervertebral disc displacement, lumbar region: Secondary | ICD-10-CM | POA: Diagnosis not present

## 2014-11-17 DIAGNOSIS — M545 Low back pain: Secondary | ICD-10-CM

## 2014-11-17 DIAGNOSIS — M4806 Spinal stenosis, lumbar region: Secondary | ICD-10-CM | POA: Diagnosis not present

## 2014-11-18 ENCOUNTER — Other Ambulatory Visit: Payer: 59

## 2014-11-25 ENCOUNTER — Ambulatory Visit: Payer: Medicare Other | Admitting: Oncology

## 2014-11-30 DIAGNOSIS — M47816 Spondylosis without myelopathy or radiculopathy, lumbar region: Secondary | ICD-10-CM | POA: Diagnosis not present

## 2014-12-17 DIAGNOSIS — L84 Corns and callosities: Secondary | ICD-10-CM | POA: Diagnosis not present

## 2014-12-17 DIAGNOSIS — B351 Tinea unguium: Secondary | ICD-10-CM | POA: Diagnosis not present

## 2015-03-04 DIAGNOSIS — N390 Urinary tract infection, site not specified: Secondary | ICD-10-CM | POA: Diagnosis not present

## 2015-03-18 DIAGNOSIS — B351 Tinea unguium: Secondary | ICD-10-CM | POA: Diagnosis not present

## 2015-03-18 DIAGNOSIS — L84 Corns and callosities: Secondary | ICD-10-CM | POA: Diagnosis not present

## 2015-03-23 DIAGNOSIS — R262 Difficulty in walking, not elsewhere classified: Secondary | ICD-10-CM | POA: Diagnosis not present

## 2015-03-23 DIAGNOSIS — Z9181 History of falling: Secondary | ICD-10-CM | POA: Diagnosis not present

## 2015-03-23 DIAGNOSIS — R278 Other lack of coordination: Secondary | ICD-10-CM | POA: Diagnosis not present

## 2015-03-24 DIAGNOSIS — R278 Other lack of coordination: Secondary | ICD-10-CM | POA: Diagnosis not present

## 2015-03-24 DIAGNOSIS — Z9181 History of falling: Secondary | ICD-10-CM | POA: Diagnosis not present

## 2015-03-24 DIAGNOSIS — R262 Difficulty in walking, not elsewhere classified: Secondary | ICD-10-CM | POA: Diagnosis not present

## 2015-03-30 DIAGNOSIS — R278 Other lack of coordination: Secondary | ICD-10-CM | POA: Diagnosis not present

## 2015-03-30 DIAGNOSIS — R262 Difficulty in walking, not elsewhere classified: Secondary | ICD-10-CM | POA: Diagnosis not present

## 2015-03-30 DIAGNOSIS — Z9181 History of falling: Secondary | ICD-10-CM | POA: Diagnosis not present

## 2015-04-07 DIAGNOSIS — Z9181 History of falling: Secondary | ICD-10-CM | POA: Diagnosis not present

## 2015-04-07 DIAGNOSIS — R278 Other lack of coordination: Secondary | ICD-10-CM | POA: Diagnosis not present

## 2015-04-07 DIAGNOSIS — R262 Difficulty in walking, not elsewhere classified: Secondary | ICD-10-CM | POA: Diagnosis not present

## 2015-04-09 DIAGNOSIS — R278 Other lack of coordination: Secondary | ICD-10-CM | POA: Diagnosis not present

## 2015-04-09 DIAGNOSIS — Z9181 History of falling: Secondary | ICD-10-CM | POA: Diagnosis not present

## 2015-04-09 DIAGNOSIS — R262 Difficulty in walking, not elsewhere classified: Secondary | ICD-10-CM | POA: Diagnosis not present

## 2015-04-14 DIAGNOSIS — R262 Difficulty in walking, not elsewhere classified: Secondary | ICD-10-CM | POA: Diagnosis not present

## 2015-04-14 DIAGNOSIS — R278 Other lack of coordination: Secondary | ICD-10-CM | POA: Diagnosis not present

## 2015-04-14 DIAGNOSIS — Z9181 History of falling: Secondary | ICD-10-CM | POA: Diagnosis not present

## 2015-04-16 DIAGNOSIS — R278 Other lack of coordination: Secondary | ICD-10-CM | POA: Diagnosis not present

## 2015-04-16 DIAGNOSIS — Z9181 History of falling: Secondary | ICD-10-CM | POA: Diagnosis not present

## 2015-04-16 DIAGNOSIS — R262 Difficulty in walking, not elsewhere classified: Secondary | ICD-10-CM | POA: Diagnosis not present

## 2015-04-19 DIAGNOSIS — Z9181 History of falling: Secondary | ICD-10-CM | POA: Diagnosis not present

## 2015-04-19 DIAGNOSIS — R262 Difficulty in walking, not elsewhere classified: Secondary | ICD-10-CM | POA: Diagnosis not present

## 2015-04-19 DIAGNOSIS — R278 Other lack of coordination: Secondary | ICD-10-CM | POA: Diagnosis not present

## 2015-04-22 DIAGNOSIS — R278 Other lack of coordination: Secondary | ICD-10-CM | POA: Diagnosis not present

## 2015-04-22 DIAGNOSIS — Z9181 History of falling: Secondary | ICD-10-CM | POA: Diagnosis not present

## 2015-04-22 DIAGNOSIS — R262 Difficulty in walking, not elsewhere classified: Secondary | ICD-10-CM | POA: Diagnosis not present

## 2015-04-24 DIAGNOSIS — R300 Dysuria: Secondary | ICD-10-CM | POA: Diagnosis not present

## 2015-04-27 DIAGNOSIS — R278 Other lack of coordination: Secondary | ICD-10-CM | POA: Diagnosis not present

## 2015-04-27 DIAGNOSIS — R262 Difficulty in walking, not elsewhere classified: Secondary | ICD-10-CM | POA: Diagnosis not present

## 2015-04-27 DIAGNOSIS — Z9181 History of falling: Secondary | ICD-10-CM | POA: Diagnosis not present

## 2015-04-28 DIAGNOSIS — Z9181 History of falling: Secondary | ICD-10-CM | POA: Diagnosis not present

## 2015-04-28 DIAGNOSIS — R278 Other lack of coordination: Secondary | ICD-10-CM | POA: Diagnosis not present

## 2015-04-28 DIAGNOSIS — R262 Difficulty in walking, not elsewhere classified: Secondary | ICD-10-CM | POA: Diagnosis not present

## 2015-05-04 DIAGNOSIS — H401123 Primary open-angle glaucoma, left eye, severe stage: Secondary | ICD-10-CM | POA: Diagnosis not present

## 2015-05-04 DIAGNOSIS — H401113 Primary open-angle glaucoma, right eye, severe stage: Secondary | ICD-10-CM | POA: Diagnosis not present

## 2015-05-05 DIAGNOSIS — R278 Other lack of coordination: Secondary | ICD-10-CM | POA: Diagnosis not present

## 2015-05-05 DIAGNOSIS — R262 Difficulty in walking, not elsewhere classified: Secondary | ICD-10-CM | POA: Diagnosis not present

## 2015-05-05 DIAGNOSIS — Z9181 History of falling: Secondary | ICD-10-CM | POA: Diagnosis not present

## 2015-05-07 DIAGNOSIS — Z9181 History of falling: Secondary | ICD-10-CM | POA: Diagnosis not present

## 2015-05-07 DIAGNOSIS — R262 Difficulty in walking, not elsewhere classified: Secondary | ICD-10-CM | POA: Diagnosis not present

## 2015-05-07 DIAGNOSIS — R278 Other lack of coordination: Secondary | ICD-10-CM | POA: Diagnosis not present

## 2015-05-11 DIAGNOSIS — R278 Other lack of coordination: Secondary | ICD-10-CM | POA: Diagnosis not present

## 2015-05-11 DIAGNOSIS — Z9181 History of falling: Secondary | ICD-10-CM | POA: Diagnosis not present

## 2015-05-11 DIAGNOSIS — R262 Difficulty in walking, not elsewhere classified: Secondary | ICD-10-CM | POA: Diagnosis not present

## 2015-05-14 DIAGNOSIS — R262 Difficulty in walking, not elsewhere classified: Secondary | ICD-10-CM | POA: Diagnosis not present

## 2015-05-14 DIAGNOSIS — R278 Other lack of coordination: Secondary | ICD-10-CM | POA: Diagnosis not present

## 2015-05-14 DIAGNOSIS — Z9181 History of falling: Secondary | ICD-10-CM | POA: Diagnosis not present

## 2015-05-18 DIAGNOSIS — R278 Other lack of coordination: Secondary | ICD-10-CM | POA: Diagnosis not present

## 2015-05-18 DIAGNOSIS — Z9181 History of falling: Secondary | ICD-10-CM | POA: Diagnosis not present

## 2015-05-18 DIAGNOSIS — R262 Difficulty in walking, not elsewhere classified: Secondary | ICD-10-CM | POA: Diagnosis not present

## 2015-05-20 DIAGNOSIS — R262 Difficulty in walking, not elsewhere classified: Secondary | ICD-10-CM | POA: Diagnosis not present

## 2015-05-20 DIAGNOSIS — R278 Other lack of coordination: Secondary | ICD-10-CM | POA: Diagnosis not present

## 2015-05-20 DIAGNOSIS — Z9181 History of falling: Secondary | ICD-10-CM | POA: Diagnosis not present

## 2015-05-25 DIAGNOSIS — R262 Difficulty in walking, not elsewhere classified: Secondary | ICD-10-CM | POA: Diagnosis not present

## 2015-05-25 DIAGNOSIS — Z9181 History of falling: Secondary | ICD-10-CM | POA: Diagnosis not present

## 2015-05-25 DIAGNOSIS — R278 Other lack of coordination: Secondary | ICD-10-CM | POA: Diagnosis not present

## 2015-05-27 DIAGNOSIS — M533 Sacrococcygeal disorders, not elsewhere classified: Secondary | ICD-10-CM | POA: Diagnosis not present

## 2015-05-27 DIAGNOSIS — M47814 Spondylosis without myelopathy or radiculopathy, thoracic region: Secondary | ICD-10-CM | POA: Diagnosis not present

## 2015-05-27 DIAGNOSIS — M160 Bilateral primary osteoarthritis of hip: Secondary | ICD-10-CM | POA: Diagnosis not present

## 2015-05-27 DIAGNOSIS — M47816 Spondylosis without myelopathy or radiculopathy, lumbar region: Secondary | ICD-10-CM | POA: Diagnosis not present

## 2015-06-17 DIAGNOSIS — B351 Tinea unguium: Secondary | ICD-10-CM | POA: Diagnosis not present

## 2015-06-17 DIAGNOSIS — L84 Corns and callosities: Secondary | ICD-10-CM | POA: Diagnosis not present

## 2015-06-30 ENCOUNTER — Emergency Department (HOSPITAL_COMMUNITY): Payer: Medicare Other

## 2015-06-30 ENCOUNTER — Encounter (HOSPITAL_COMMUNITY): Payer: Self-pay | Admitting: Emergency Medicine

## 2015-06-30 ENCOUNTER — Emergency Department (HOSPITAL_COMMUNITY)
Admission: EM | Admit: 2015-06-30 | Discharge: 2015-06-30 | Disposition: A | Discharge status: Home / Self Care | Payer: Medicare Other | Attending: Emergency Medicine | Admitting: Emergency Medicine

## 2015-06-30 DIAGNOSIS — Y939 Activity, unspecified: Secondary | ICD-10-CM | POA: Diagnosis not present

## 2015-06-30 DIAGNOSIS — R52 Pain, unspecified: Secondary | ICD-10-CM

## 2015-06-30 DIAGNOSIS — I129 Hypertensive chronic kidney disease with stage 1 through stage 4 chronic kidney disease, or unspecified chronic kidney disease: Secondary | ICD-10-CM | POA: Diagnosis not present

## 2015-06-30 DIAGNOSIS — N183 Chronic kidney disease, stage 3 (moderate): Secondary | ICD-10-CM | POA: Diagnosis not present

## 2015-06-30 DIAGNOSIS — Y92002 Bathroom of unspecified non-institutional (private) residence single-family (private) house as the place of occurrence of the external cause: Secondary | ICD-10-CM | POA: Diagnosis not present

## 2015-06-30 DIAGNOSIS — T148 Other injury of unspecified body region: Secondary | ICD-10-CM | POA: Diagnosis not present

## 2015-06-30 DIAGNOSIS — S299XXA Unspecified injury of thorax, initial encounter: Secondary | ICD-10-CM | POA: Diagnosis not present

## 2015-06-30 DIAGNOSIS — M546 Pain in thoracic spine: Secondary | ICD-10-CM | POA: Diagnosis not present

## 2015-06-30 DIAGNOSIS — Y999 Unspecified external cause status: Secondary | ICD-10-CM | POA: Diagnosis not present

## 2015-06-30 DIAGNOSIS — M545 Low back pain: Secondary | ICD-10-CM | POA: Diagnosis not present

## 2015-06-30 DIAGNOSIS — Z853 Personal history of malignant neoplasm of breast: Secondary | ICD-10-CM | POA: Insufficient documentation

## 2015-06-30 DIAGNOSIS — W19XXXA Unspecified fall, initial encounter: Secondary | ICD-10-CM

## 2015-06-30 DIAGNOSIS — Z79899 Other long term (current) drug therapy: Secondary | ICD-10-CM | POA: Insufficient documentation

## 2015-06-30 DIAGNOSIS — M549 Dorsalgia, unspecified: Secondary | ICD-10-CM | POA: Diagnosis not present

## 2015-06-30 DIAGNOSIS — W01198A Fall on same level from slipping, tripping and stumbling with subsequent striking against other object, initial encounter: Secondary | ICD-10-CM | POA: Diagnosis not present

## 2015-06-30 DIAGNOSIS — S3992XA Unspecified injury of lower back, initial encounter: Secondary | ICD-10-CM | POA: Diagnosis not present

## 2015-06-30 MED ORDER — ACETAMINOPHEN 500 MG PO TABS: 500.0000 mg | ORAL_TABLET | Freq: Four times a day (QID) | ORAL | Status: DC | PRN

## 2015-06-30 MED ORDER — TRAMADOL HCL 50 MG PO TABS
50.0000 mg | ORAL_TABLET | Freq: Once | ORAL | Status: AC
  Administered 2015-06-30: 50 mg via ORAL
  Filled 2015-06-30: qty 1

## 2015-06-30 NOTE — ED Notes (Addendum)
Per EMS, pt from [facility] where she had an unwitnessed fall. Pt reports loosing her balance, hitting her back on the shower. Pt c/o lower and mid back pain. Denies LOC and hitting head. Hx dementia.

## 2015-06-30 NOTE — ED Notes (Signed)
Patient transported to X-ray 

## 2015-06-30 NOTE — Progress Notes (Signed)
CSW met with patient at bedside. There was no family present. Patient was alert and oriented.   Patient confirms that she presents to [facility] due to fall. Patient stated " I was putting my clothes on and I fell on the floor". Patient informed CSW that this incident is the only time that she has fallen this year.   Patient confirms that she is form [place]. According to pt, she has been living at the facility since January. Patient states that she receives assistance with completing her ADL's. Patient state upon discharges she would feel safe to return to facility.  Willette Brace [phone] ED CSW .06/30/2015 7:12 PM

## 2015-06-30 NOTE — ED Notes (Signed)
She is incensed and insists that she "was wearing a bracelet when I got here--where is it?!"  Multiple staff here have assured her that no one here observed her to have a bracelet upon arrival here, including the initial people who saw and received her and took EMS report.  She will have none of this and continues to insist that someone here "took it".  We continue kind reassurance with her in the expectation that she will find it upon her return to [facility].

## 2015-06-30 NOTE — ED Notes (Signed)
Pt changed into dry brief and moved up in bed

## 2015-06-30 NOTE — Discharge Instructions (Signed)
Fall Prevention in the Home  Falls can cause injuries and can affect people from all age groups. There are many simple things that you can do to make your home safe and to help prevent falls. WHAT CAN I DO ON THE OUTSIDE OF MY HOME?  Regularly repair the edges of walkways and driveways and fix any cracks.  Remove high doorway thresholds.  Trim any shrubbery on the main path into your home.  Use bright outdoor lighting.  Clear walkways of debris and clutter, including tools and rocks.  Regularly check that handrails are securely fastened and in good repair. Both sides of any steps should have handrails.  Install guardrails along the edges of any raised decks or porches.  Have leaves, snow, and ice cleared regularly.  Use sand or salt on walkways during winter months.  In the garage, clean up any spills right away, including grease or oil spills. WHAT CAN I DO IN THE BATHROOM?  Use night lights.  Install grab bars by the toilet and in the tub and shower. Do not use towel bars as grab bars.  Use non-skid mats or decals on the floor of the tub or shower.  If you need to sit down while you are in the shower, use a plastic, non-slip stool.Marland Kitchen  Keep the floor dry. Immediately clean up any water that spills on the floor.  Remove soap buildup in the tub or shower on a regular basis.  Attach bath mats securely with double-sided non-slip rug tape.  Remove throw rugs and other tripping hazards from the floor. WHAT CAN I DO IN THE BEDROOM?  Use night lights.  Make sure that a bedside light is easy to reach.  Do not use oversized bedding that drapes onto the floor.  Have a firm chair that has side arms to use for getting dressed.  Remove throw rugs and other tripping hazards from the floor. WHAT CAN I DO IN THE KITCHEN?   Clean up any spills right away.  Avoid walking on wet floors.  Place frequently used items in easy-to-reach places.  If you need to reach for something  above you, use a sturdy step stool that has a grab bar.  Keep electrical cables out of the way.  Do not use floor polish or wax that makes floors slippery. If you have to use wax, make sure that it is non-skid floor wax.  Remove throw rugs and other tripping hazards from the floor. WHAT CAN I DO IN THE STAIRWAYS?  Do not leave any items on the stairs.  Make sure that there are handrails on both sides of the stairs. Fix handrails that are broken or loose. Make sure that handrails are as long as the stairways.  Check any carpeting to make sure that it is firmly attached to the stairs. Fix any carpet that is loose or worn.  Avoid having throw rugs at the top or bottom of stairways, or secure the rugs with carpet tape to prevent them from moving.  Make sure that you have a light switch at the top of the stairs and the bottom of the stairs. If you do not have them, have them installed. WHAT ARE SOME OTHER FALL PREVENTION TIPS?  Wear closed-toe shoes that fit well and support your feet. Wear shoes that have rubber soles or low heels.  When you use a stepladder, make sure that it is completely opened and that the sides are firmly locked. Have someone hold the ladder while you  are using it. Do not climb a closed stepladder.  Add color or contrast paint or tape to grab bars and handrails in your home. Place contrasting color strips on the first and last steps.  Use mobility aids as needed, such as canes, walkers, scooters, and crutches.  Turn on lights if it is dark. Replace any light bulbs that burn out.  Set up furniture so that there are clear paths. Keep the furniture in the same spot.  Fix any uneven floor surfaces.  Choose a carpet design that does not hide the edge of steps of a stairway.  Be aware of any and all pets.  Review your medicines with your healthcare provider. Some medicines can cause dizziness or changes in blood pressure, which increase your risk of falling. Talk  with your health care provider about other ways that you can decrease your risk of falls. This may include working with a physical therapist or trainer to improve your strength, balance, and endurance.   This information is not intended to replace advice given to you by your health care provider. Make sure you discuss any questions you have with your health care provider.   Document Released: 01/06/2002 Document Revised: 06/02/2014 Document Reviewed: 02/20/2014 Elsevier Interactive Patient Education 2016 Elsevier Inc. Back Pain, Adult Back pain is very common in adults.The cause of back pain is rarely dangerous and the pain often gets better over time.The cause of your back pain may not be known. Some common causes of back pain include:  Strain of the muscles or ligaments supporting the spine.  Wear and tear (degeneration) of the spinal disks.  Arthritis.  Direct injury to the back. For many people, back pain may return. Since back pain is rarely dangerous, most people can learn to manage this condition on their own. HOME CARE INSTRUCTIONS Watch your back pain for any changes. The following actions may help to lessen any discomfort you are feeling:  Remain active. It is stressful on your back to sit or stand in one place for long periods of time. Do not sit, drive, or stand in one place for more than 30 minutes at a time. Take short walks on even surfaces as soon as you are able.Try to increase the length of time you walk each day.  Exercise regularly as directed by your health care provider. Exercise helps your back heal faster. It also helps avoid future injury by keeping your muscles strong and flexible.  Do not stay in bed.Resting more than 1-2 days can delay your recovery.  Pay attention to your body when you bend and lift. The most comfortable positions are those that put less stress on your recovering back. Always use proper lifting techniques, including:  Bending your  knees.  Keeping the load close to your body.  Avoiding twisting.  Find a comfortable position to sleep. Use a firm mattress and lie on your side with your knees slightly bent. If you lie on your back, put a pillow under your knees.  Avoid feeling anxious or stressed.Stress increases muscle tension and can worsen back pain.It is important to recognize when you are anxious or stressed and learn ways to manage it, such as with exercise.  Take medicines only as directed by your health care provider. Over-the-counter medicines to reduce pain and inflammation are often the most helpful.Your health care provider may prescribe muscle relaxant drugs.These medicines help dull your pain so you can more quickly return to your normal activities and healthy exercise.  Apply  ice to the injured area:  Put ice in a plastic bag.  Place a towel between your skin and the bag.  Leave the ice on for 20 minutes, 2-3 times a day for the first 2-3 days. After that, ice and heat may be alternated to reduce pain and spasms.  Maintain a healthy weight. Excess weight puts extra stress on your back and makes it difficult to maintain good posture. SEEK MEDICAL CARE IF:  You have pain that is not relieved with rest or medicine.  You have increasing pain going down into the legs or buttocks.  You have pain that does not improve in one week.  You have night pain.  You lose weight.  You have a fever or chills. SEEK IMMEDIATE MEDICAL CARE IF:   You develop new bowel or bladder control problems.  You have unusual weakness or numbness in your arms or legs.  You develop nausea or vomiting.  You develop abdominal pain.  You feel faint.   This information is not intended to replace advice given to you by your health care provider. Make sure you discuss any questions you have with your health care provider.   Document Released: 01/16/2005 Document Revised: 02/06/2014 Document Reviewed: 05/20/2013 Elsevier  Interactive Patient Education [employer].

## 2015-06-30 NOTE — ED Notes (Signed)
Attempted to call report to [facility], no answer at this time. Patient being transported back to facility by [facility]

## 2015-06-30 NOTE — ED Provider Notes (Signed)
CSN: [id number]     Arrival date & time 06/30/15  1423 History   First MD Initiated Contact with Patient 06/30/15 1501     Chief Complaint  Patient presents with  . Back Pain  . Fall    Michelle [name] is a 82 y.o. female who presents to the ED via EMS from [facility] assisted living after a trip and fall today. She complains of bilateral lower back pain that is worse with movement. She reports she went to the bathroom after lunch and slipped on the floor, hitting her back on part of the shower. She denies hitting her head or LOC. She denies neck pain. She has taken nothing for treatment prior to arrival today. She is not on anticoagulants. She denies fevers, recent illness, cough, CP, SOB, urinary symptoms, neck pain, numbness, weakness or loss of bowel or bladder control.   The history is provided by the patient and the EMS personnel. No language interpreter was used.    Past Medical History  Diagnosis Date  . Hypertension   . Hyperlipidemia LDL goal < 100   . Polymyalgia rheumatica ([place])   . GERD (gastroesophageal reflux disease)   . Irritable bowel syndrome   . Diverticulitis   . Sciatica   . Vertigo   . Edema of leg   . Glaucoma   . Appendicitis   . Arthritis   . Acute upper respiratory infections of unspecified site   . Urge incontinence   . Other specified symptom associated with female genital organs   . Regional enteritis of unspecified site   . Sebaceous cyst   . Depression   . Other malaise and fatigue   . Abnormal weight gain   . Leukocytopenia, unspecified   . Dementia in conditions classified elsewhere without behavioral disturbance   . Anemia, unspecified   . Chronic kidney disease, stage III (moderate)   . Unspecified arthropathy, shoulder region   . Other B-complex deficiencies   . Unspecified vitamin D deficiency   . Memory loss   . Abnormality of gait   . Personal history of fall   . Malignant neoplasm of breast (female), unspecified site   .  Unspecified glaucoma   . Diverticulosis of colon (without mention of hemorrhage)   . Irritable bowel syndrome   . Sciatica   . Dizziness and giddiness    Past Surgical History  Procedure Laterality Date  . Nasal sinus surgery  1966  . Cholecystectomy  1972  . Cataract extraction  1990  . Breast surgery      R breast lumpectomy for benign disease  . Breast lumpectomy  10/28/10    left breast    Family History  Problem Relation Age of Onset  . Stroke Father   . Hypertension Mother   . Stroke Mother   . Breast cancer Mother   . Heart attack Brother   . Cancer Brother   . Stroke Brother   . Heart attack Son   . Colon cancer Sister    Social History  Substance Use Topics  . Smoking status: Never Smoker   . Smokeless tobacco: Never Used  . Alcohol Use: No   OB History    No data available     Review of Systems  Constitutional: Negative for fever.  HENT: Negative for congestion and sore throat.   Eyes: Negative for visual disturbance.  Respiratory: Negative for cough and shortness of breath.   Cardiovascular: Negative for chest pain.  Gastrointestinal: Negative for  nausea, vomiting and abdominal pain.  Genitourinary: Negative for dysuria and difficulty urinating.  Musculoskeletal: Positive for back pain. Negative for neck pain.  Skin: Negative for rash and wound.  Neurological: Negative for dizziness, syncope, speech difficulty, weakness, light-headedness, numbness and headaches.      Allergies  Codeine  Home Medications   Prior to Admission medications   Medication Sig Start Date End Date Taking? Authorizing Provider  amLODipine (NORVASC) 5 MG tablet Take 5 mg by mouth daily.   Yes Historical Provider, MD  bimatoprost (LUMIGAN) 0.01 % SOLN Place 1 drop into both eyes at bedtime.    Yes Historical Provider, MD  brimonidine-timolol (COMBIGAN) 0.2-0.5 % ophthalmic solution Place 1 drop into both eyes 2 (two) times daily.    Yes Historical Provider, MD  calcium  carbonate 1250 MG capsule Take 1,250 mg by mouth daily.   Yes Historical Provider, MD  cetirizine (ZYRTEC) 10 MG tablet Take 10 mg by mouth at bedtime.   Yes Historical Provider, MD  diclofenac sodium (VOLTAREN) 1 % GEL Apply 2 g topically 3 (three) times daily.   Yes Historical Provider, MD  dorzolamide (TRUSOPT) 2 % ophthalmic solution Place 1 drop into both eyes 2 (two) times daily. 02/13/14  Yes Estill Dooms, MD  guaiFENesin (ROBITUSSIN) 100 MG/5ML liquid Take 600 mg by mouth 3 (three) times daily as needed for cough.   Yes Historical Provider, MD  lidocaine (LIDODERM) 5 % APPLY 1 PATCH TO EACH SHOULDER , KEEP ON 12 HOURS, THEN KEEP OFF FOR 12 HOURS. 02/13/14  Yes Estill Dooms, MD  loperamide (IMODIUM) 2 MG capsule Take 2 mg by mouth as needed for diarrhea or loose stools.   Yes Historical Provider, MD  Multiple Vitamin (MULTIVITAMIN WITH MINERALS) TABS Take 1 tablet by mouth daily with breakfast.    Yes Historical Provider, MD  ranitidine (ZANTAC) 150 MG tablet Take 150 mg by mouth daily as needed for heartburn.    Yes Historical Provider, MD  acetaminophen (TYLENOL) 500 MG tablet Take 1 tablet (500 mg total) by mouth every 6 (six) hours as needed for mild pain or moderate pain. 06/30/15   [name], PA-C  amLODipine (NORVASC) 10 MG tablet Take 1 tablet (10 mg total) by mouth daily with breakfast. Patient not taking: Reported on 10/13/2014 03/29/14   [name], MD  benzonatate (TESSALON PERLES) 100 MG capsule Take 1 capsule (100 mg total) by mouth 3 (three) times daily as needed for cough. 02/13/14   [name], DO  citalopram (CELEXA) 10 MG tablet Take 1 tablet (10 mg total) by mouth daily. 03/29/14   [name], MD  ferrous sulfate 325 (65 FE) MG tablet Take 325 mg by mouth daily with breakfast.    Historical Provider, MD   BP 171/78 mmHg  Pulse 76  Temp(Src) 98.9 F (37.2 C) (Oral)  Resp 14  SpO2 96% Physical Exam  Constitutional: She appears well-developed and  well-nourished. No distress.  Nontoxic appearing.  HENT:  Head: Normocephalic and atraumatic.  Right Ear: External ear normal.  Left Ear: External ear normal.  Mouth/Throat: Oropharynx is clear and moist.  No visible evidence of head trauma.  Eyes: Conjunctivae and EOM are normal. Pupils are equal, round, and reactive to light. Right eye exhibits no discharge. Left eye exhibits no discharge.  Neck: Normal range of motion. Neck supple.  No midline neck tenderness.  Cardiovascular: Normal rate, regular rhythm, normal heart sounds and intact distal pulses.  Exam reveals no gallop  and no friction rub.   No murmur heard. Pulmonary/Chest: Effort normal and breath sounds normal. No respiratory distress. She has no wheezes. She has no rales.  Abdominal: Soft. There is no tenderness. There is no guarding.  Musculoskeletal: Normal range of motion. She exhibits tenderness. She exhibits no edema.  Patient has some mild tenderness to her thoracic and lumbar spine. No back edema, ecchymosis, erythema or warmth. She has good range of motion of her bilateral upper and lower extremities. Patient is spontaneously moving all extremities in a coordinated fashion exhibiting good strength. No hip instability or tenderness. No pain with ROM of her bilateral knees, elbows or shoulders.   Lymphadenopathy:    She has no cervical adenopathy.  Neurological: She is alert. Coordination normal.  The patient is alert and oriented 3. Sensation intact her bilateral upper and lower extremities. Speech is clear and coherent.  Skin: Skin is warm and dry. No rash noted. She is not diaphoretic. No erythema. No pallor.  Psychiatric: She has a normal mood and affect. Her behavior is normal.  Nursing note and vitals reviewed.   ED Course  Procedures (including critical care time) Labs Review Labs Reviewed - No data to display  Imaging Review Dg Thoracic Spine 2 View  06/30/2015  CLINICAL DATA:  Fall today; Mid back pain all  the way down; no leg pain EXAM: LUMBAR SPINE - COMPLETE 4+ VIEW; THORACIC SPINE 2 VIEWS COMPARISON:  None. FINDINGS: Sigmoid scoliotic curvature of the thoracolumbar spine. Normal anterior-posterior alignment. No fracture or paraspinous hematoma. Multilevel degenerative disc disease. Calcification of the distal aorta. IMPRESSION: No acute findings Electronically Signed   By: Skipper Cliche M.D.   On: 06/30/2015 15:59   Dg Lumbar Spine Complete  06/30/2015  CLINICAL DATA:  Fall today; Mid back pain all the way down; no leg pain EXAM: LUMBAR SPINE - COMPLETE 4+ VIEW; THORACIC SPINE 2 VIEWS COMPARISON:  None. FINDINGS: Sigmoid scoliotic curvature of the thoracolumbar spine. Normal anterior-posterior alignment. No fracture or paraspinous hematoma. Multilevel degenerative disc disease. Calcification of the distal aorta. IMPRESSION: No acute findings Electronically Signed   By: Skipper Cliche M.D.   On: 06/30/2015 15:59   I have personally reviewed and evaluated these images as part of my medical decision-making.   EKG Interpretation None      Filed Vitals:   06/30/15 1428 06/30/15 1603 06/30/15 1650  BP:  171/78 171/78  Pulse:  66 76  Temp:  98.9 F (37.2 C)   TempSrc:  Oral   Resp:  16 14  SpO2: 97% 99% 96%     MDM   Meds given in ED:  Medications  traMADol (ULTRAM) tablet 50 mg (not administered)    New Prescriptions   ACETAMINOPHEN (TYLENOL) 500 MG TABLET    Take 1 tablet (500 mg total) by mouth every 6 (six) hours as needed for mild pain or moderate pain.    Final diagnoses:  Bilateral low back pain, with sciatica presence unspecified  Fall, initial encounter   This  is a 82 y.o. female who presents to the ED via EMS from [facility] assisted living after a trip and fall today. She complains of bilateral lower back pain that is worse with movement. She reports she went to the bathroom after lunch and slipped on the floor, hitting her back on part of the shower. She denies  hitting her head or LOC. She denies neck pain. She has taken nothing for treatment prior to arrival today. She is  not on anticoagulants. On exam the patient is afebrile nontoxic appearing. She has no visible signs of trauma to her body. She has midline and bilateral low back tenderness to palpation. No upper or chest wall tenderness to palpation. Patient's bilateral hips, knees, shoulder and elbow joints are supple and nontender to palpation. No visible signs of head trauma. Patient was examined completely undressed. Patient denies any head or neck pain. X-rays were obtained of her lumbar and thoracic spine. No acute findings on x-rays. Will discharge back to skilled nursing facility. I encouraged her to follow up with her PCP.   This patient was discussed with and evaluated by Dr. Tamera Punt who agrees with assessment and plan.    [name], PA-C 06/30/15 [place], MD 06/30/15 [phone]

## 2015-07-14 DIAGNOSIS — N183 Chronic kidney disease, stage 3 (moderate): Secondary | ICD-10-CM | POA: Diagnosis not present

## 2015-07-14 DIAGNOSIS — D649 Anemia, unspecified: Secondary | ICD-10-CM | POA: Diagnosis not present

## 2015-07-14 DIAGNOSIS — R600 Localized edema: Secondary | ICD-10-CM | POA: Diagnosis not present

## 2015-07-14 DIAGNOSIS — M810 Age-related osteoporosis without current pathological fracture: Secondary | ICD-10-CM | POA: Diagnosis not present

## 2015-07-14 DIAGNOSIS — M6281 Muscle weakness (generalized): Secondary | ICD-10-CM | POA: Diagnosis not present

## 2015-08-31 DIAGNOSIS — R2681 Unsteadiness on feet: Secondary | ICD-10-CM | POA: Diagnosis not present

## 2015-08-31 DIAGNOSIS — M6281 Muscle weakness (generalized): Secondary | ICD-10-CM | POA: Diagnosis not present

## 2015-08-31 DIAGNOSIS — Z9181 History of falling: Secondary | ICD-10-CM | POA: Diagnosis not present

## 2015-09-01 DIAGNOSIS — R2681 Unsteadiness on feet: Secondary | ICD-10-CM | POA: Diagnosis not present

## 2015-09-01 DIAGNOSIS — M6281 Muscle weakness (generalized): Secondary | ICD-10-CM | POA: Diagnosis not present

## 2015-09-01 DIAGNOSIS — Z9181 History of falling: Secondary | ICD-10-CM | POA: Diagnosis not present

## 2015-09-03 DIAGNOSIS — R2681 Unsteadiness on feet: Secondary | ICD-10-CM | POA: Diagnosis not present

## 2015-09-03 DIAGNOSIS — M6281 Muscle weakness (generalized): Secondary | ICD-10-CM | POA: Diagnosis not present

## 2015-09-03 DIAGNOSIS — Z9181 History of falling: Secondary | ICD-10-CM | POA: Diagnosis not present

## 2015-09-06 DIAGNOSIS — R2681 Unsteadiness on feet: Secondary | ICD-10-CM | POA: Diagnosis not present

## 2015-09-06 DIAGNOSIS — Z9181 History of falling: Secondary | ICD-10-CM | POA: Diagnosis not present

## 2015-09-06 DIAGNOSIS — M6281 Muscle weakness (generalized): Secondary | ICD-10-CM | POA: Diagnosis not present

## 2015-09-07 DIAGNOSIS — R2681 Unsteadiness on feet: Secondary | ICD-10-CM | POA: Diagnosis not present

## 2015-09-07 DIAGNOSIS — Z9181 History of falling: Secondary | ICD-10-CM | POA: Diagnosis not present

## 2015-09-07 DIAGNOSIS — M6281 Muscle weakness (generalized): Secondary | ICD-10-CM | POA: Diagnosis not present

## 2015-09-09 DIAGNOSIS — Z9181 History of falling: Secondary | ICD-10-CM | POA: Diagnosis not present

## 2015-09-09 DIAGNOSIS — R2681 Unsteadiness on feet: Secondary | ICD-10-CM | POA: Diagnosis not present

## 2015-09-09 DIAGNOSIS — M6281 Muscle weakness (generalized): Secondary | ICD-10-CM | POA: Diagnosis not present

## 2015-09-12 ENCOUNTER — Encounter (HOSPITAL_COMMUNITY): Payer: Self-pay | Admitting: Neurology

## 2015-09-12 ENCOUNTER — Emergency Department (HOSPITAL_COMMUNITY)
Admission: EM | Admit: 2015-09-12 | Discharge: 2015-09-12 | Disposition: A | Discharge status: Home / Self Care | Payer: Medicare Other | Attending: Emergency Medicine | Admitting: Emergency Medicine

## 2015-09-12 ENCOUNTER — Emergency Department (HOSPITAL_COMMUNITY): Payer: Medicare Other

## 2015-09-12 DIAGNOSIS — R4182 Altered mental status, unspecified: Secondary | ICD-10-CM | POA: Diagnosis not present

## 2015-09-12 DIAGNOSIS — Z853 Personal history of malignant neoplasm of breast: Secondary | ICD-10-CM | POA: Diagnosis not present

## 2015-09-12 DIAGNOSIS — R079 Chest pain, unspecified: Secondary | ICD-10-CM | POA: Diagnosis not present

## 2015-09-12 DIAGNOSIS — N183 Chronic kidney disease, stage 3 (moderate): Secondary | ICD-10-CM | POA: Insufficient documentation

## 2015-09-12 DIAGNOSIS — G308 Other Alzheimer's disease: Secondary | ICD-10-CM | POA: Diagnosis not present

## 2015-09-12 DIAGNOSIS — Z79899 Other long term (current) drug therapy: Secondary | ICD-10-CM | POA: Diagnosis not present

## 2015-09-12 DIAGNOSIS — R0789 Other chest pain: Secondary | ICD-10-CM | POA: Insufficient documentation

## 2015-09-12 DIAGNOSIS — I129 Hypertensive chronic kidney disease with stage 1 through stage 4 chronic kidney disease, or unspecified chronic kidney disease: Secondary | ICD-10-CM | POA: Insufficient documentation

## 2015-09-12 DIAGNOSIS — R05 Cough: Secondary | ICD-10-CM | POA: Diagnosis not present

## 2015-09-12 LAB — BASIC METABOLIC PANEL
Anion gap: 6 (ref 5–15)
BUN: 17 mg/dL (ref 6–20)
CHLORIDE: 109 mmol/L (ref 101–111)
CO2: 27 mmol/L (ref 22–32)
CREATININE: 1.22 mg/dL — AB (ref 0.44–1.00)
Calcium: 9.6 mg/dL (ref 8.9–10.3)
GFR calc non Af Amer: 37 mL/min — ABNORMAL LOW (ref >60)
GFR, EST AFRICAN AMERICAN: 43 mL/min — AB (ref >60)
Glucose, Bld: 87 mg/dL (ref 65–99)
Potassium: 4.4 mmol/L (ref 3.5–5.1)
SODIUM: 142 mmol/L (ref 135–145)

## 2015-09-12 LAB — CBC
HCT: 36.5 % (ref 36.0–46.0)
HEMOGLOBIN: 10.9 g/dL — AB (ref 12.0–15.0)
MCH: 30 pg (ref 26.0–34.0)
MCHC: 29.9 g/dL — ABNORMAL LOW (ref 30.0–36.0)
MCV: 100.6 fL — ABNORMAL HIGH (ref 78.0–100.0)
PLATELETS: 166 10*3/uL (ref 150–400)
RBC: 3.63 MIL/uL — AB (ref 3.87–5.11)
RDW: 12.8 % (ref 11.5–15.5)
WBC: 3.8 10*3/uL — AB (ref 4.0–10.5)

## 2015-09-12 LAB — I-STAT TROPONIN, ED: TROPONIN I, POC: 0 ng/mL (ref 0.00–0.08)

## 2015-09-12 MED ORDER — IBUPROFEN 200 MG PO TABS
200.0000 mg | ORAL_TABLET | Freq: Once | ORAL | Status: AC
  Administered 2015-09-12: 200 mg via ORAL
  Filled 2015-09-12: qty 1

## 2015-09-12 NOTE — ED Notes (Signed)
PTAR at bedside 

## 2015-09-12 NOTE — ED Provider Notes (Signed)
[facility] DEPT Provider Note   CSN: [id number] Arrival date & time: 09/12/15  1244  First Provider Contact:  None       History   Chief Complaint Chief Complaint  Patient presents with  . Chest Pain    HPI Michelle [name] is a 82 y.o. female.  82 year old female presents with son onset of left-sided anterior chest wall pain characterized as sharp and worse with palpation or movement. Does have some associated dyspnea and is worse when you touch the area. No associated diaphoresis. No recent history of trauma. No cough or congestion. Denies any leg pain or swelling. No pleuritic component to it. No rashes to the area. No prior history of same. Symptoms persistent and no treatment use prior to arrival      Past Medical History:  Diagnosis Date  . Abnormal weight gain   . Abnormality of gait   . Acute upper respiratory infections of unspecified site   . Anemia, unspecified   . Appendicitis   . Arthritis   . Chronic kidney disease, stage III (moderate)   . Dementia in conditions classified elsewhere without behavioral disturbance   . Depression   . Diverticulitis   . Diverticulosis of colon (without mention of hemorrhage)   . Dizziness and giddiness   . Edema of leg   . GERD (gastroesophageal reflux disease)   . Glaucoma   . Hyperlipidemia LDL goal < 100   . Hypertension   . Irritable bowel syndrome   . Irritable bowel syndrome   . Leukocytopenia, unspecified   . Malignant neoplasm of breast (female), unspecified site   . Memory loss   . Other B-complex deficiencies   . Other malaise and fatigue   . Other specified symptom associated with female genital organs   . Personal history of fall   . Polymyalgia rheumatica ([place])   . Regional enteritis of unspecified site   . Sciatica   . Sciatica   . Sebaceous cyst   . Unspecified arthropathy, shoulder region   . Unspecified glaucoma   . Unspecified vitamin D deficiency   . Urge incontinence   . Vertigo      Patient Active Problem List   Diagnosis Date Noted  . Acute renal failure superimposed on stage 3 chronic kidney disease ([place]) 03/31/2014  . Subdural hematoma ([place]) 03/26/2014  . SDH (subdural hematoma) ([place]) 03/26/2014  . Traumatic subdural hematoma ([facility])   . Rib pain on right side 10/03/2013  . Glaucoma 10/03/2013  . Essential hypertension, benign 10/03/2013  . Myalgia and myositis 10/03/2013  . Closed left clavicular fracture 05/15/2013  . Separation of left acromioclavicular joint 05/15/2013  . 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 Alzheimer's dementia 06/30/2012  . Urge incontinence   . Hyperlipidemia LDL goal < 100   . Memory loss 05/23/2012  . Fall 02/13/2011  . UTI (lower urinary tract infection) 02/13/2011  . Difficulty walking 02/12/2011  . Polymyalgia rheumatica ([place]) 02/12/2011  . HTN (hypertension) 02/12/2011    Past Surgical History:  Procedure Laterality Date  . BREAST LUMPECTOMY  10/28/10   left breast   . BREAST SURGERY     R breast lumpectomy for benign disease  . CATARACT EXTRACTION  1990  . CHOLECYSTECTOMY  1972  . NASAL SINUS SURGERY  1966    OB History    No data available       Home Medications    Prior to Admission medications  Medication Sig Start Date End Date Taking? Authorizing Provider  acetaminophen (TYLENOL) 500 MG tablet Take 1 tablet (500 mg total) by mouth every 6 (six) hours as needed for mild pain or moderate pain. 06/30/15   [name], PA-C  amLODipine (NORVASC) 10 MG tablet Take 1 tablet (10 mg total) by mouth daily with breakfast. Patient not taking: Reported on 10/13/2014 03/29/14   [name], MD  amLODipine (NORVASC) 5 MG tablet Take 5 mg by mouth daily.    Historical Provider, MD  benzonatate (TESSALON PERLES) 100 MG capsule Take 1 capsule (100 mg total) by mouth 3 (three) times daily as needed for cough. Patient not taking: Reported on  06/30/2015 02/13/14   [name], DO  brimonidine-timolol (COMBIGAN) 0.2-0.5 % ophthalmic solution Place 1 drop into both eyes 2 (two) times daily.     Historical Provider, MD  calcium carbonate 1250 MG capsule Take 1,250 mg by mouth daily.    Historical Provider, MD  cetirizine (ZYRTEC) 10 MG tablet Take 10 mg by mouth at bedtime.    Historical Provider, MD  citalopram (CELEXA) 10 MG tablet Take 1 tablet (10 mg total) by mouth daily. Patient not taking: Reported on 06/30/2015 03/29/14   [name], MD  diclofenac sodium (VOLTAREN) 1 % GEL Apply 2 g topically 3 (three) times daily.    Historical Provider, MD  dorzolamide (TRUSOPT) 2 % ophthalmic solution Place 1 drop into both eyes 2 (two) times daily. Patient not taking: Reported on 06/30/2015 02/13/14   Estill Dooms, MD  lidocaine (LIDODERM) 5 % APPLY 1 PATCH TO EACH SHOULDER , KEEP ON 12 HOURS, THEN KEEP OFF FOR 12 HOURS. Patient not taking: Reported on 06/30/2015 02/13/14   Estill Dooms, MD  loperamide (IMODIUM) 2 MG capsule Take 2 mg by mouth as needed for diarrhea or loose stools. Reported on 06/30/2015    Historical Provider, MD  Multiple Vitamin (MULTIVITAMIN WITH MINERALS) TABS Take 1 tablet by mouth daily with breakfast.     Historical Provider, MD  ranitidine (ZANTAC) 150 MG tablet Take 150 mg by mouth daily as needed for heartburn.     Historical Provider, MD    Family History Family History  Problem Relation Age of Onset  . Stroke Father   . Hypertension Mother   . Stroke Mother   . Breast cancer Mother   . Heart attack Brother   . Cancer Brother   . Stroke Brother   . Heart attack Son   . Colon cancer Sister     Social History Social History  Substance Use Topics  . Smoking status: Never Smoker  . Smokeless tobacco: Never Used  . Alcohol use No     Allergies   Codeine   Review of Systems Review of Systems  All other systems reviewed and are negative.    Physical Exam Updated Vital Signs BP 184/84  (BP Location: Right Arm)   Pulse 63   Temp 99.6 F (37.6 C) (Oral)   Resp 18   SpO2 100%   Physical Exam  Constitutional: She is oriented to person, place, and time. She appears well-developed and well-nourished.  Non-toxic appearance. No distress.  HENT:  Head: Normocephalic and atraumatic.  Eyes: Conjunctivae, EOM and lids are normal. Pupils are equal, round, and reactive to light.  Neck: Normal range of motion. Neck supple. No tracheal deviation present. No thyroid mass present.  Cardiovascular: Normal rate, regular rhythm and normal heart sounds.  Exam reveals no gallop.  No murmur heard. Pulmonary/Chest: Effort normal and breath sounds normal. No stridor. No respiratory distress. She has no decreased breath sounds. She has no wheezes. She has no rhonchi. She has no rales. She exhibits tenderness. She exhibits no crepitus and no swelling.    Abdominal: Soft. Normal appearance and bowel sounds are normal. She exhibits no distension. There is no tenderness. There is no rebound and no CVA tenderness.  Musculoskeletal: Normal range of motion. She exhibits no edema or tenderness.  Neurological: She is alert and oriented to person, place, and time. She has normal strength. No cranial nerve deficit or sensory deficit. GCS eye subscore is 4. GCS verbal subscore is 5. GCS motor subscore is 6.  Skin: Skin is warm and dry. No abrasion and no rash noted.  Psychiatric: Her affect is blunt. Her speech is delayed. She is slowed.  Nursing note and vitals reviewed.    ED Treatments / Results  Labs (all labs ordered are listed, but only abnormal results are displayed) Labs Reviewed  [place], ED    EKG  EKG Interpretation  Date/Time:  Sunday September 12 2015 12:44:41 EDT Ventricular Rate:  58 PR Interval:    QRS Duration: 95 QT Interval:  427 QTC Calculation: 420 R Axis:   -8 Text Interpretation:  Sinus rhythm Abnormal R-wave progression, early  transition Left ventricular hypertrophy Confirmed by [name]  MD, Navin Dogan ([id number]) on 09/12/2015 1:11:33 PM       Radiology No results found.  Procedures Procedures (including critical care time)  Medications Ordered in ED Medications - No data to display   Initial Impression / Assessment and Plan / ED Course  I have reviewed the triage vital signs and the nursing notes.  Pertinent labs & imaging results that were available during my care of the patient were reviewed by me and considered in my medical decision making (see chart for details).  Clinical Course  Patient's chest x-ray and EKG here are without acute findings. Her pain is been persistent and pinpoint. Do not think that this represents ACS or pulmonary embolism. Medicated with Motrin will be discharged home   Final Clinical Impressions(s) / ED Diagnoses   Final diagnoses:  None    New Prescriptions New Prescriptions   No medications on file     Lacretia Leigh, MD 09/12/15 1452

## 2015-09-12 NOTE — ED Notes (Signed)
Michelle [name], Med Tech at [facility] given report that patient is coming back. Secretary is calling for [employer].

## 2015-09-12 NOTE — ED Triage Notes (Signed)
Per ems- Pt comes from [facility] assisted living center where she stays in the memory care unit. C/o left sided cp sudden onset ay 0900 while resting. Pain is sharp, no radiation. Given 324 aspirin, painfree at current. Per daughter this is a "good" day and she is a x 4. BP 170 palpated, SB HR 58

## 2015-09-13 DIAGNOSIS — R2681 Unsteadiness on feet: Secondary | ICD-10-CM | POA: Diagnosis not present

## 2015-09-13 DIAGNOSIS — Z9181 History of falling: Secondary | ICD-10-CM | POA: Diagnosis not present

## 2015-09-13 DIAGNOSIS — M6281 Muscle weakness (generalized): Secondary | ICD-10-CM | POA: Diagnosis not present

## 2015-09-14 DIAGNOSIS — M6281 Muscle weakness (generalized): Secondary | ICD-10-CM | POA: Diagnosis not present

## 2015-09-14 DIAGNOSIS — R2681 Unsteadiness on feet: Secondary | ICD-10-CM | POA: Diagnosis not present

## 2015-09-14 DIAGNOSIS — Z9181 History of falling: Secondary | ICD-10-CM | POA: Diagnosis not present

## 2015-09-16 DIAGNOSIS — M6281 Muscle weakness (generalized): Secondary | ICD-10-CM | POA: Diagnosis not present

## 2015-09-16 DIAGNOSIS — R2681 Unsteadiness on feet: Secondary | ICD-10-CM | POA: Diagnosis not present

## 2015-09-16 DIAGNOSIS — Z9181 History of falling: Secondary | ICD-10-CM | POA: Diagnosis not present

## 2015-09-20 DIAGNOSIS — Z9181 History of falling: Secondary | ICD-10-CM | POA: Diagnosis not present

## 2015-09-20 DIAGNOSIS — M6281 Muscle weakness (generalized): Secondary | ICD-10-CM | POA: Diagnosis not present

## 2015-09-20 DIAGNOSIS — R2681 Unsteadiness on feet: Secondary | ICD-10-CM | POA: Diagnosis not present

## 2015-09-21 DIAGNOSIS — Z9181 History of falling: Secondary | ICD-10-CM | POA: Diagnosis not present

## 2015-09-21 DIAGNOSIS — R2681 Unsteadiness on feet: Secondary | ICD-10-CM | POA: Diagnosis not present

## 2015-09-21 DIAGNOSIS — M6281 Muscle weakness (generalized): Secondary | ICD-10-CM | POA: Diagnosis not present

## 2015-09-23 DIAGNOSIS — M6281 Muscle weakness (generalized): Secondary | ICD-10-CM | POA: Diagnosis not present

## 2015-09-23 DIAGNOSIS — R2681 Unsteadiness on feet: Secondary | ICD-10-CM | POA: Diagnosis not present

## 2015-09-23 DIAGNOSIS — Z9181 History of falling: Secondary | ICD-10-CM | POA: Diagnosis not present

## 2015-09-27 DIAGNOSIS — Z9181 History of falling: Secondary | ICD-10-CM | POA: Diagnosis not present

## 2015-09-27 DIAGNOSIS — M6281 Muscle weakness (generalized): Secondary | ICD-10-CM | POA: Diagnosis not present

## 2015-09-27 DIAGNOSIS — R2681 Unsteadiness on feet: Secondary | ICD-10-CM | POA: Diagnosis not present

## 2015-09-28 DIAGNOSIS — M6281 Muscle weakness (generalized): Secondary | ICD-10-CM | POA: Diagnosis not present

## 2015-09-28 DIAGNOSIS — Z9181 History of falling: Secondary | ICD-10-CM | POA: Diagnosis not present

## 2015-09-28 DIAGNOSIS — R2681 Unsteadiness on feet: Secondary | ICD-10-CM | POA: Diagnosis not present

## 2015-09-30 DIAGNOSIS — M6281 Muscle weakness (generalized): Secondary | ICD-10-CM | POA: Diagnosis not present

## 2015-09-30 DIAGNOSIS — Z9181 History of falling: Secondary | ICD-10-CM | POA: Diagnosis not present

## 2015-09-30 DIAGNOSIS — R2681 Unsteadiness on feet: Secondary | ICD-10-CM | POA: Diagnosis not present

## 2015-10-05 DIAGNOSIS — R2681 Unsteadiness on feet: Secondary | ICD-10-CM | POA: Diagnosis not present

## 2015-10-05 DIAGNOSIS — Z9181 History of falling: Secondary | ICD-10-CM | POA: Diagnosis not present

## 2015-10-05 DIAGNOSIS — M6281 Muscle weakness (generalized): Secondary | ICD-10-CM | POA: Diagnosis not present

## 2015-10-07 DIAGNOSIS — M6281 Muscle weakness (generalized): Secondary | ICD-10-CM | POA: Diagnosis not present

## 2015-10-07 DIAGNOSIS — R2681 Unsteadiness on feet: Secondary | ICD-10-CM | POA: Diagnosis not present

## 2015-10-07 DIAGNOSIS — Z9181 History of falling: Secondary | ICD-10-CM | POA: Diagnosis not present

## 2015-10-11 DIAGNOSIS — M6281 Muscle weakness (generalized): Secondary | ICD-10-CM | POA: Diagnosis not present

## 2015-10-11 DIAGNOSIS — R2681 Unsteadiness on feet: Secondary | ICD-10-CM | POA: Diagnosis not present

## 2015-10-11 DIAGNOSIS — Z9181 History of falling: Secondary | ICD-10-CM | POA: Diagnosis not present

## 2015-10-12 DIAGNOSIS — M6281 Muscle weakness (generalized): Secondary | ICD-10-CM | POA: Diagnosis not present

## 2015-10-12 DIAGNOSIS — R2681 Unsteadiness on feet: Secondary | ICD-10-CM | POA: Diagnosis not present

## 2015-10-12 DIAGNOSIS — Z9181 History of falling: Secondary | ICD-10-CM | POA: Diagnosis not present

## 2015-10-14 ENCOUNTER — Other Ambulatory Visit: Payer: Self-pay | Admitting: Internal Medicine

## 2015-10-14 DIAGNOSIS — Z9181 History of falling: Secondary | ICD-10-CM | POA: Diagnosis not present

## 2015-10-14 DIAGNOSIS — M6281 Muscle weakness (generalized): Secondary | ICD-10-CM | POA: Diagnosis not present

## 2015-10-14 DIAGNOSIS — R2681 Unsteadiness on feet: Secondary | ICD-10-CM | POA: Diagnosis not present

## 2015-10-18 DIAGNOSIS — Z9181 History of falling: Secondary | ICD-10-CM | POA: Diagnosis not present

## 2015-10-18 DIAGNOSIS — M6281 Muscle weakness (generalized): Secondary | ICD-10-CM | POA: Diagnosis not present

## 2015-10-18 DIAGNOSIS — R2681 Unsteadiness on feet: Secondary | ICD-10-CM | POA: Diagnosis not present

## 2015-10-19 DIAGNOSIS — R2681 Unsteadiness on feet: Secondary | ICD-10-CM | POA: Diagnosis not present

## 2015-10-19 DIAGNOSIS — M6281 Muscle weakness (generalized): Secondary | ICD-10-CM | POA: Diagnosis not present

## 2015-10-19 DIAGNOSIS — Z9181 History of falling: Secondary | ICD-10-CM | POA: Diagnosis not present

## 2015-10-20 ENCOUNTER — Other Ambulatory Visit: Payer: Self-pay | Admitting: Internal Medicine

## 2015-10-21 DIAGNOSIS — M6281 Muscle weakness (generalized): Secondary | ICD-10-CM | POA: Diagnosis not present

## 2015-10-21 DIAGNOSIS — Z9181 History of falling: Secondary | ICD-10-CM | POA: Diagnosis not present

## 2015-10-21 DIAGNOSIS — R2681 Unsteadiness on feet: Secondary | ICD-10-CM | POA: Diagnosis not present

## 2015-10-25 DIAGNOSIS — L84 Corns and callosities: Secondary | ICD-10-CM | POA: Diagnosis not present

## 2015-10-25 DIAGNOSIS — Z9181 History of falling: Secondary | ICD-10-CM | POA: Diagnosis not present

## 2015-10-25 DIAGNOSIS — R2681 Unsteadiness on feet: Secondary | ICD-10-CM | POA: Diagnosis not present

## 2015-10-25 DIAGNOSIS — M6281 Muscle weakness (generalized): Secondary | ICD-10-CM | POA: Diagnosis not present

## 2015-10-25 DIAGNOSIS — B351 Tinea unguium: Secondary | ICD-10-CM | POA: Diagnosis not present

## 2015-10-26 DIAGNOSIS — R2681 Unsteadiness on feet: Secondary | ICD-10-CM | POA: Diagnosis not present

## 2015-10-26 DIAGNOSIS — M6281 Muscle weakness (generalized): Secondary | ICD-10-CM | POA: Diagnosis not present

## 2015-10-26 DIAGNOSIS — Z9181 History of falling: Secondary | ICD-10-CM | POA: Diagnosis not present

## 2015-10-28 DIAGNOSIS — Z9181 History of falling: Secondary | ICD-10-CM | POA: Diagnosis not present

## 2015-10-28 DIAGNOSIS — M6281 Muscle weakness (generalized): Secondary | ICD-10-CM | POA: Diagnosis not present

## 2015-10-28 DIAGNOSIS — R2681 Unsteadiness on feet: Secondary | ICD-10-CM | POA: Diagnosis not present

## 2015-11-01 DIAGNOSIS — R2681 Unsteadiness on feet: Secondary | ICD-10-CM | POA: Diagnosis not present

## 2015-11-01 DIAGNOSIS — Z9181 History of falling: Secondary | ICD-10-CM | POA: Diagnosis not present

## 2015-11-01 DIAGNOSIS — M6281 Muscle weakness (generalized): Secondary | ICD-10-CM | POA: Diagnosis not present

## 2015-11-02 DIAGNOSIS — Z9181 History of falling: Secondary | ICD-10-CM | POA: Diagnosis not present

## 2015-11-02 DIAGNOSIS — M6281 Muscle weakness (generalized): Secondary | ICD-10-CM | POA: Diagnosis not present

## 2015-11-02 DIAGNOSIS — R2681 Unsteadiness on feet: Secondary | ICD-10-CM | POA: Diagnosis not present

## 2015-11-04 DIAGNOSIS — Z9181 History of falling: Secondary | ICD-10-CM | POA: Diagnosis not present

## 2015-11-04 DIAGNOSIS — M6281 Muscle weakness (generalized): Secondary | ICD-10-CM | POA: Diagnosis not present

## 2015-11-04 DIAGNOSIS — R2681 Unsteadiness on feet: Secondary | ICD-10-CM | POA: Diagnosis not present

## 2015-11-08 DIAGNOSIS — H401113 Primary open-angle glaucoma, right eye, severe stage: Secondary | ICD-10-CM | POA: Diagnosis not present

## 2015-11-08 DIAGNOSIS — R2681 Unsteadiness on feet: Secondary | ICD-10-CM | POA: Diagnosis not present

## 2015-11-08 DIAGNOSIS — Z9181 History of falling: Secondary | ICD-10-CM | POA: Diagnosis not present

## 2015-11-08 DIAGNOSIS — H52203 Unspecified astigmatism, bilateral: Secondary | ICD-10-CM | POA: Diagnosis not present

## 2015-11-08 DIAGNOSIS — H401123 Primary open-angle glaucoma, left eye, severe stage: Secondary | ICD-10-CM | POA: Diagnosis not present

## 2015-11-08 DIAGNOSIS — M6281 Muscle weakness (generalized): Secondary | ICD-10-CM | POA: Diagnosis not present

## 2015-11-09 DIAGNOSIS — R2681 Unsteadiness on feet: Secondary | ICD-10-CM | POA: Diagnosis not present

## 2015-11-09 DIAGNOSIS — Z9181 History of falling: Secondary | ICD-10-CM | POA: Diagnosis not present

## 2015-11-09 DIAGNOSIS — M6281 Muscle weakness (generalized): Secondary | ICD-10-CM | POA: Diagnosis not present

## 2015-11-11 DIAGNOSIS — M6281 Muscle weakness (generalized): Secondary | ICD-10-CM | POA: Diagnosis not present

## 2015-11-11 DIAGNOSIS — R2681 Unsteadiness on feet: Secondary | ICD-10-CM | POA: Diagnosis not present

## 2015-11-11 DIAGNOSIS — Z9181 History of falling: Secondary | ICD-10-CM | POA: Diagnosis not present

## 2015-11-15 DIAGNOSIS — M6281 Muscle weakness (generalized): Secondary | ICD-10-CM | POA: Diagnosis not present

## 2015-11-15 DIAGNOSIS — Z9181 History of falling: Secondary | ICD-10-CM | POA: Diagnosis not present

## 2015-11-15 DIAGNOSIS — R2681 Unsteadiness on feet: Secondary | ICD-10-CM | POA: Diagnosis not present

## 2015-11-16 DIAGNOSIS — M6281 Muscle weakness (generalized): Secondary | ICD-10-CM | POA: Diagnosis not present

## 2015-11-16 DIAGNOSIS — Z9181 History of falling: Secondary | ICD-10-CM | POA: Diagnosis not present

## 2015-11-16 DIAGNOSIS — R2681 Unsteadiness on feet: Secondary | ICD-10-CM | POA: Diagnosis not present

## 2015-11-18 DIAGNOSIS — Z9181 History of falling: Secondary | ICD-10-CM | POA: Diagnosis not present

## 2015-11-18 DIAGNOSIS — M6281 Muscle weakness (generalized): Secondary | ICD-10-CM | POA: Diagnosis not present

## 2015-11-18 DIAGNOSIS — R2681 Unsteadiness on feet: Secondary | ICD-10-CM | POA: Diagnosis not present

## 2015-11-22 DIAGNOSIS — R2681 Unsteadiness on feet: Secondary | ICD-10-CM | POA: Diagnosis not present

## 2015-11-22 DIAGNOSIS — Z9181 History of falling: Secondary | ICD-10-CM | POA: Diagnosis not present

## 2015-11-22 DIAGNOSIS — M6281 Muscle weakness (generalized): Secondary | ICD-10-CM | POA: Diagnosis not present

## 2015-11-24 DIAGNOSIS — Z9181 History of falling: Secondary | ICD-10-CM | POA: Diagnosis not present

## 2015-11-24 DIAGNOSIS — R2681 Unsteadiness on feet: Secondary | ICD-10-CM | POA: Diagnosis not present

## 2015-11-24 DIAGNOSIS — M6281 Muscle weakness (generalized): Secondary | ICD-10-CM | POA: Diagnosis not present

## 2015-11-25 DIAGNOSIS — M6281 Muscle weakness (generalized): Secondary | ICD-10-CM | POA: Diagnosis not present

## 2015-11-25 DIAGNOSIS — R2681 Unsteadiness on feet: Secondary | ICD-10-CM | POA: Diagnosis not present

## 2015-11-25 DIAGNOSIS — Z9181 History of falling: Secondary | ICD-10-CM | POA: Diagnosis not present

## 2015-11-29 DIAGNOSIS — R2681 Unsteadiness on feet: Secondary | ICD-10-CM | POA: Diagnosis not present

## 2015-11-29 DIAGNOSIS — Z9181 History of falling: Secondary | ICD-10-CM | POA: Diagnosis not present

## 2015-11-29 DIAGNOSIS — M6281 Muscle weakness (generalized): Secondary | ICD-10-CM | POA: Diagnosis not present

## 2015-12-02 DIAGNOSIS — R2681 Unsteadiness on feet: Secondary | ICD-10-CM | POA: Diagnosis not present

## 2015-12-02 DIAGNOSIS — Z9181 History of falling: Secondary | ICD-10-CM | POA: Diagnosis not present

## 2015-12-02 DIAGNOSIS — M6281 Muscle weakness (generalized): Secondary | ICD-10-CM | POA: Diagnosis not present

## 2015-12-06 ENCOUNTER — Other Ambulatory Visit: Payer: Self-pay | Admitting: Internal Medicine

## 2015-12-06 DIAGNOSIS — M6281 Muscle weakness (generalized): Secondary | ICD-10-CM | POA: Diagnosis not present

## 2015-12-06 DIAGNOSIS — R2681 Unsteadiness on feet: Secondary | ICD-10-CM | POA: Diagnosis not present

## 2015-12-06 DIAGNOSIS — Z9181 History of falling: Secondary | ICD-10-CM | POA: Diagnosis not present

## 2015-12-07 DIAGNOSIS — M6281 Muscle weakness (generalized): Secondary | ICD-10-CM | POA: Diagnosis not present

## 2015-12-07 DIAGNOSIS — Z9181 History of falling: Secondary | ICD-10-CM | POA: Diagnosis not present

## 2015-12-07 DIAGNOSIS — R2681 Unsteadiness on feet: Secondary | ICD-10-CM | POA: Diagnosis not present

## 2015-12-08 ENCOUNTER — Other Ambulatory Visit: Payer: Self-pay | Admitting: Internal Medicine

## 2015-12-09 DIAGNOSIS — M6281 Muscle weakness (generalized): Secondary | ICD-10-CM | POA: Diagnosis not present

## 2015-12-09 DIAGNOSIS — R2681 Unsteadiness on feet: Secondary | ICD-10-CM | POA: Diagnosis not present

## 2015-12-09 DIAGNOSIS — Z9181 History of falling: Secondary | ICD-10-CM | POA: Diagnosis not present

## 2015-12-13 DIAGNOSIS — Z9181 History of falling: Secondary | ICD-10-CM | POA: Diagnosis not present

## 2015-12-13 DIAGNOSIS — R2681 Unsteadiness on feet: Secondary | ICD-10-CM | POA: Diagnosis not present

## 2015-12-13 DIAGNOSIS — M6281 Muscle weakness (generalized): Secondary | ICD-10-CM | POA: Diagnosis not present

## 2015-12-14 DIAGNOSIS — Z9181 History of falling: Secondary | ICD-10-CM | POA: Diagnosis not present

## 2015-12-14 DIAGNOSIS — R2681 Unsteadiness on feet: Secondary | ICD-10-CM | POA: Diagnosis not present

## 2015-12-14 DIAGNOSIS — M6281 Muscle weakness (generalized): Secondary | ICD-10-CM | POA: Diagnosis not present

## 2015-12-16 DIAGNOSIS — Z9181 History of falling: Secondary | ICD-10-CM | POA: Diagnosis not present

## 2015-12-16 DIAGNOSIS — R2681 Unsteadiness on feet: Secondary | ICD-10-CM | POA: Diagnosis not present

## 2015-12-16 DIAGNOSIS — M6281 Muscle weakness (generalized): Secondary | ICD-10-CM | POA: Diagnosis not present

## 2015-12-20 DIAGNOSIS — M6281 Muscle weakness (generalized): Secondary | ICD-10-CM | POA: Diagnosis not present

## 2015-12-20 DIAGNOSIS — R2681 Unsteadiness on feet: Secondary | ICD-10-CM | POA: Diagnosis not present

## 2015-12-20 DIAGNOSIS — Z9181 History of falling: Secondary | ICD-10-CM | POA: Diagnosis not present

## 2015-12-21 DIAGNOSIS — R2681 Unsteadiness on feet: Secondary | ICD-10-CM | POA: Diagnosis not present

## 2015-12-21 DIAGNOSIS — Z9181 History of falling: Secondary | ICD-10-CM | POA: Diagnosis not present

## 2015-12-21 DIAGNOSIS — M6281 Muscle weakness (generalized): Secondary | ICD-10-CM | POA: Diagnosis not present

## 2015-12-22 DIAGNOSIS — Z9181 History of falling: Secondary | ICD-10-CM | POA: Diagnosis not present

## 2015-12-22 DIAGNOSIS — M6281 Muscle weakness (generalized): Secondary | ICD-10-CM | POA: Diagnosis not present

## 2015-12-22 DIAGNOSIS — R2681 Unsteadiness on feet: Secondary | ICD-10-CM | POA: Diagnosis not present

## 2015-12-27 DIAGNOSIS — Z9181 History of falling: Secondary | ICD-10-CM | POA: Diagnosis not present

## 2015-12-27 DIAGNOSIS — R2681 Unsteadiness on feet: Secondary | ICD-10-CM | POA: Diagnosis not present

## 2015-12-27 DIAGNOSIS — M6281 Muscle weakness (generalized): Secondary | ICD-10-CM | POA: Diagnosis not present

## 2015-12-28 DIAGNOSIS — Z9181 History of falling: Secondary | ICD-10-CM | POA: Diagnosis not present

## 2015-12-28 DIAGNOSIS — R2681 Unsteadiness on feet: Secondary | ICD-10-CM | POA: Diagnosis not present

## 2015-12-28 DIAGNOSIS — M6281 Muscle weakness (generalized): Secondary | ICD-10-CM | POA: Diagnosis not present

## 2015-12-30 DIAGNOSIS — M6281 Muscle weakness (generalized): Secondary | ICD-10-CM | POA: Diagnosis not present

## 2015-12-30 DIAGNOSIS — Z9181 History of falling: Secondary | ICD-10-CM | POA: Diagnosis not present

## 2015-12-30 DIAGNOSIS — R2681 Unsteadiness on feet: Secondary | ICD-10-CM | POA: Diagnosis not present

## 2016-01-03 DIAGNOSIS — R2681 Unsteadiness on feet: Secondary | ICD-10-CM | POA: Diagnosis not present

## 2016-01-03 DIAGNOSIS — M6281 Muscle weakness (generalized): Secondary | ICD-10-CM | POA: Diagnosis not present

## 2016-01-03 DIAGNOSIS — Z9181 History of falling: Secondary | ICD-10-CM | POA: Diagnosis not present

## 2016-01-04 DIAGNOSIS — Z9181 History of falling: Secondary | ICD-10-CM | POA: Diagnosis not present

## 2016-01-04 DIAGNOSIS — M6281 Muscle weakness (generalized): Secondary | ICD-10-CM | POA: Diagnosis not present

## 2016-01-04 DIAGNOSIS — R2681 Unsteadiness on feet: Secondary | ICD-10-CM | POA: Diagnosis not present

## 2016-01-06 DIAGNOSIS — R2681 Unsteadiness on feet: Secondary | ICD-10-CM | POA: Diagnosis not present

## 2016-01-06 DIAGNOSIS — M6281 Muscle weakness (generalized): Secondary | ICD-10-CM | POA: Diagnosis not present

## 2016-01-06 DIAGNOSIS — Z9181 History of falling: Secondary | ICD-10-CM | POA: Diagnosis not present

## 2016-01-10 DIAGNOSIS — Z9181 History of falling: Secondary | ICD-10-CM | POA: Diagnosis not present

## 2016-01-10 DIAGNOSIS — M6281 Muscle weakness (generalized): Secondary | ICD-10-CM | POA: Diagnosis not present

## 2016-01-10 DIAGNOSIS — R2681 Unsteadiness on feet: Secondary | ICD-10-CM | POA: Diagnosis not present

## 2016-01-11 DIAGNOSIS — R2681 Unsteadiness on feet: Secondary | ICD-10-CM | POA: Diagnosis not present

## 2016-01-11 DIAGNOSIS — M6281 Muscle weakness (generalized): Secondary | ICD-10-CM | POA: Diagnosis not present

## 2016-01-11 DIAGNOSIS — Z9181 History of falling: Secondary | ICD-10-CM | POA: Diagnosis not present

## 2016-01-13 DIAGNOSIS — B351 Tinea unguium: Secondary | ICD-10-CM | POA: Diagnosis not present

## 2016-01-13 DIAGNOSIS — L84 Corns and callosities: Secondary | ICD-10-CM | POA: Diagnosis not present

## 2016-02-05 DIAGNOSIS — R05 Cough: Secondary | ICD-10-CM | POA: Diagnosis not present

## 2016-04-22 IMAGING — CT CT HEAD W/O CM
2 series · 15 of 30 positions shown, 19 images · non-contrast
Comparison: CT of the head performed 08/28/2012

CLINICAL DATA: Acute onset of occipital headache. High blood
pressure. Initial encounter.

EXAM:
CT HEAD WITHOUT CONTRAST
TECHNIQUE: Contiguous axial images were obtained from the base of the skull
through the vertex without intravenous contrast.

[Series 2: head w/o · axial · non-contrast · 0.45mm/px · z∈[-211,-81]mm · 13 of 32 slices shown, 17 images]
[im 3/32  brain]
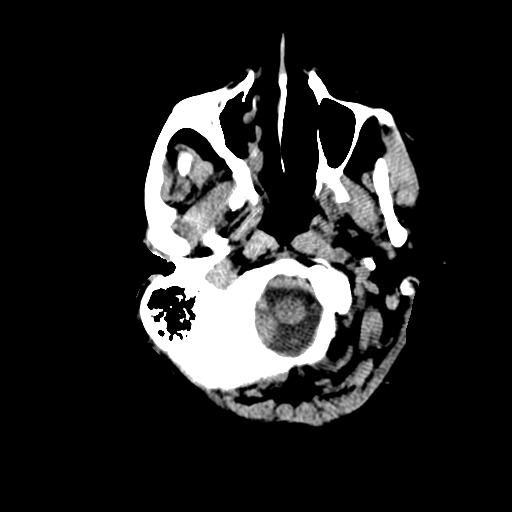
[im 3/32  bone]
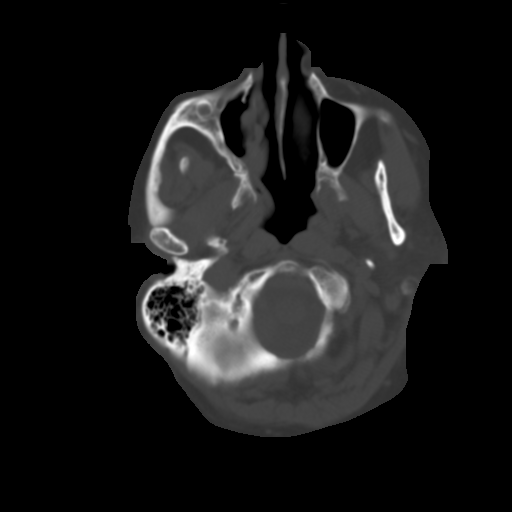
[im 5/32  brain]
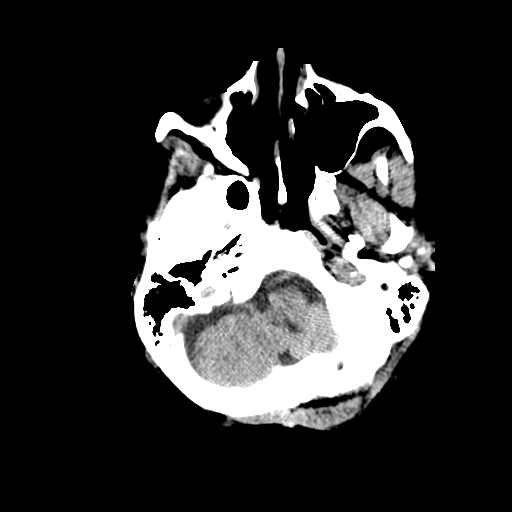
[im 7/32  brain]
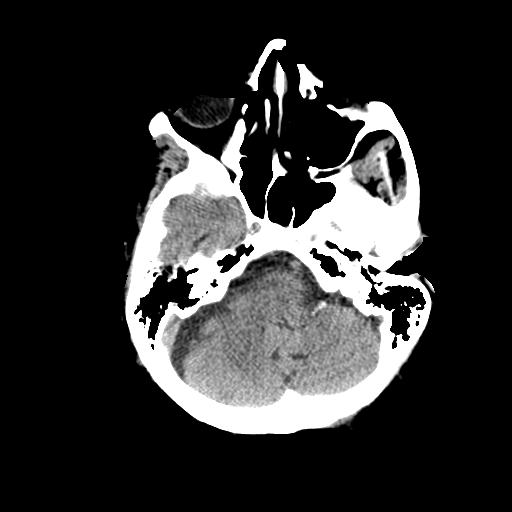
[im 9/32  brain]
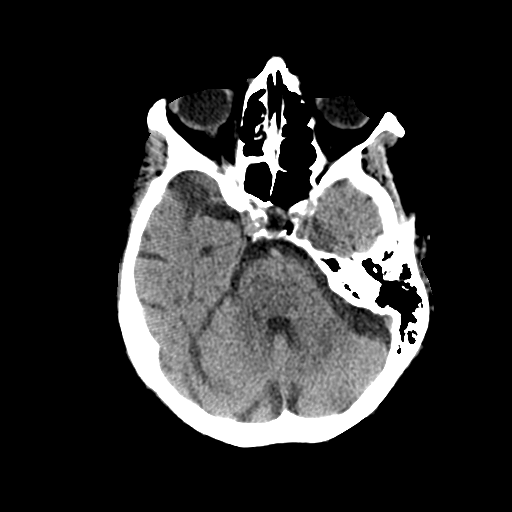
[im 12/32  brain]
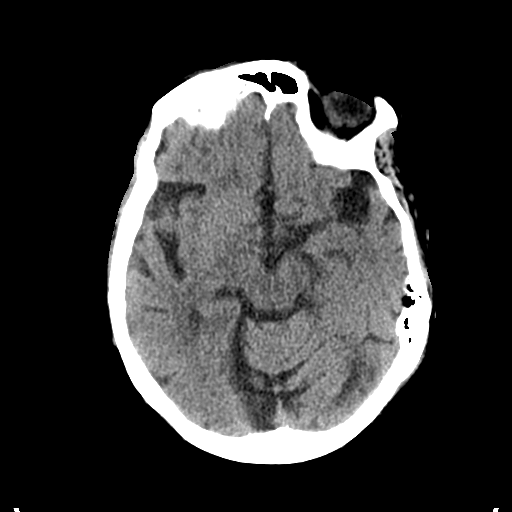
[im 12/32  bone]
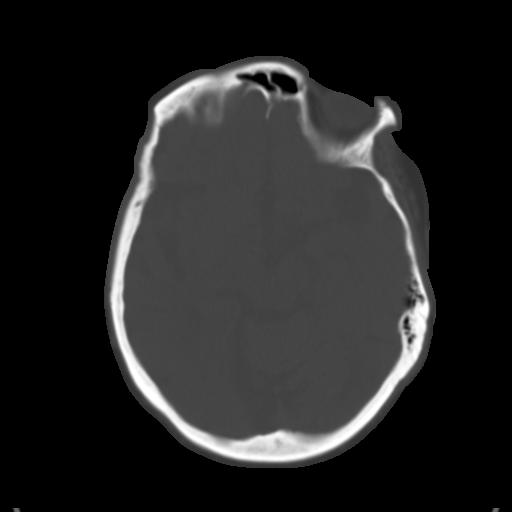
[im 14/32  brain]
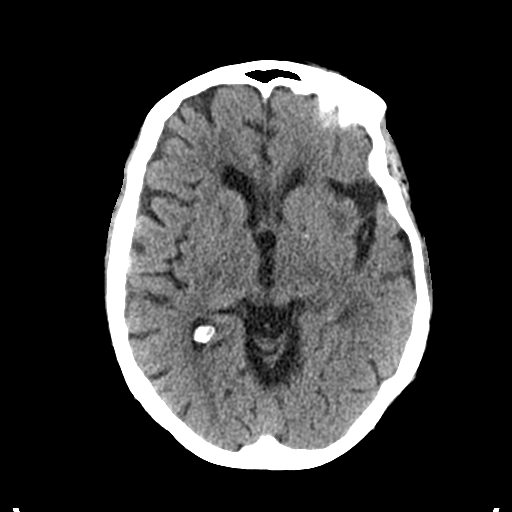
[im 16/32  brain]
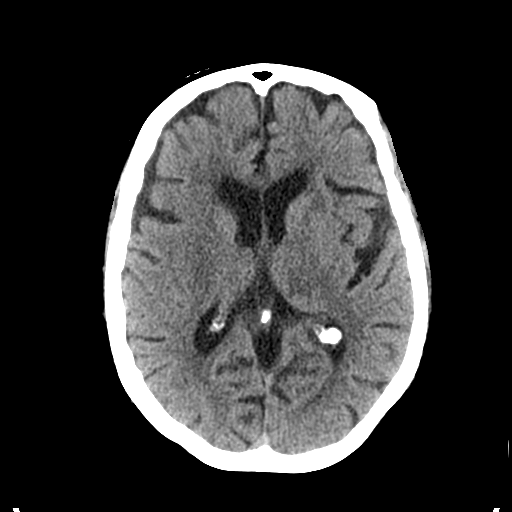
[im 18/32  brain]
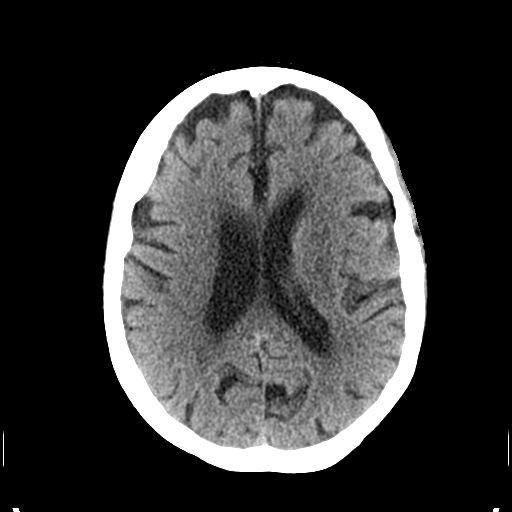
[im 20/32  brain]
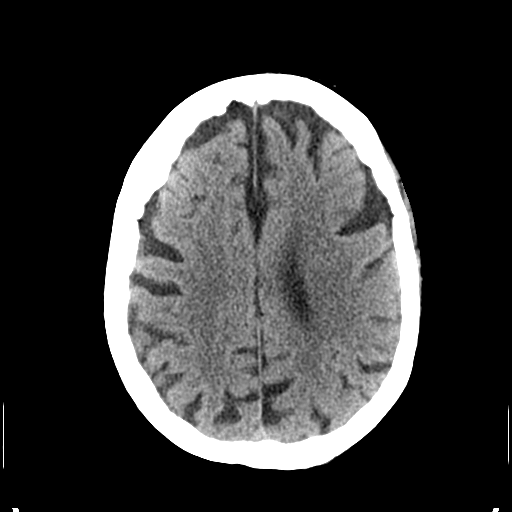
[im 20/32  bone]
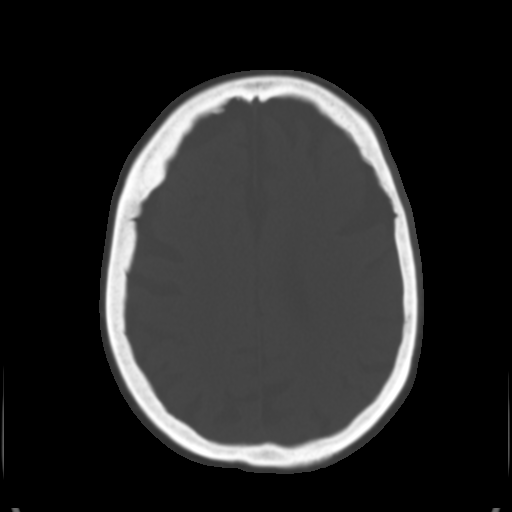
[im 23/32  brain]
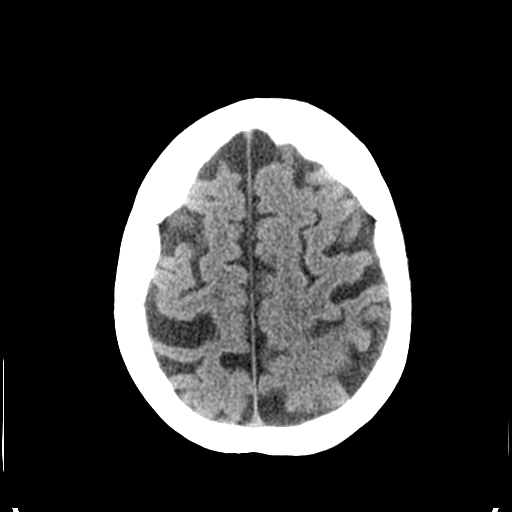
[im 25/32  brain]
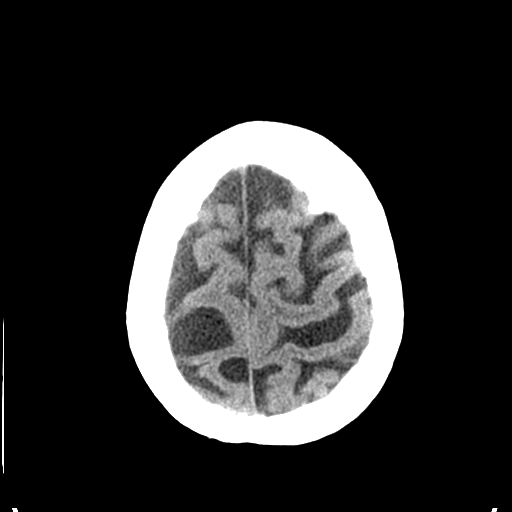
[im 27/32  brain]
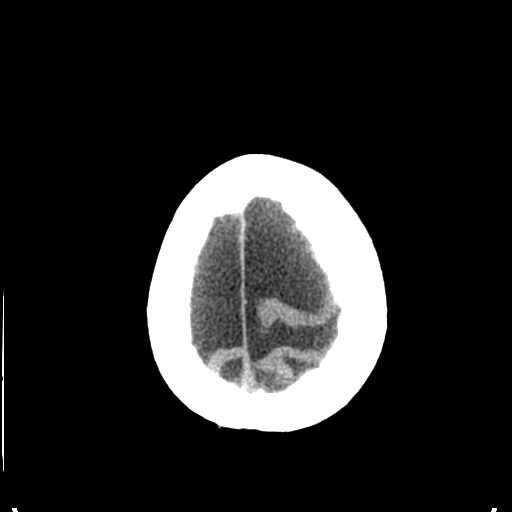
[im 29/32  brain]
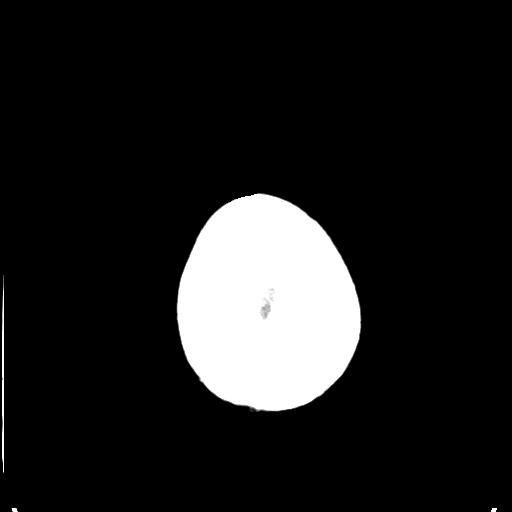
[im 29/32  bone]
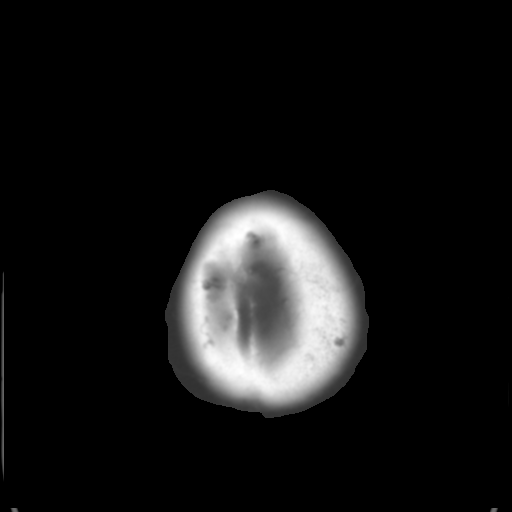

[Series 3: bone windows · axial · 0.45mm/px · z∈[-211,-191]mm · 2 of 32 slices shown]
[im 3/32  bone]
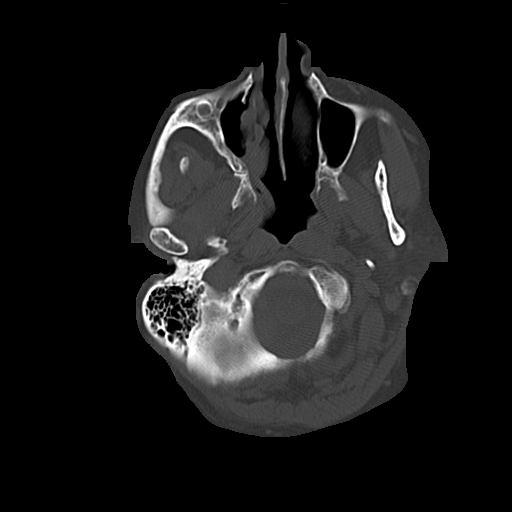
[im 7/32  bone]
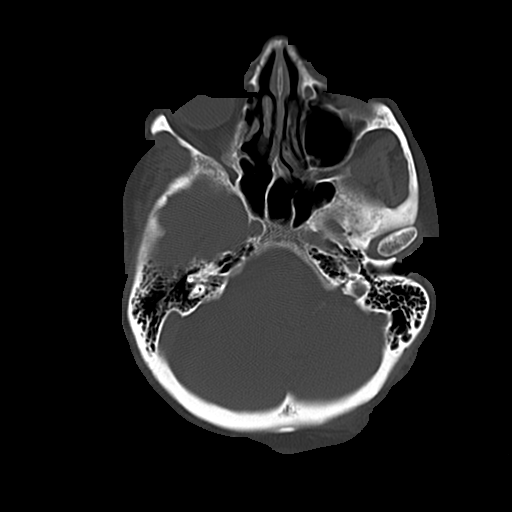

[15 of 30 positions shown; findings below may reference images not displayed]

FINDINGS: There is no evidence of acute infarction, mass lesion, or intra- or
extra-axial hemorrhage on CT.

Prominence of the ventricles and sulci reflects mild to moderate
cortical volume loss. Cerebellar atrophy is noted. Scattered
periventricular and subcortical white matter change likely reflects
small vessel ischemic microangiopathy.

The brainstem and fourth ventricle are within normal limits. The
basal ganglia are unremarkable in appearance. The cerebral
hemispheres demonstrate grossly normal gray-white differentiation.
No mass effect or midline shift is seen.

There is no evidence of fracture; visualized osseous structures are
unremarkable in appearance. The orbits are within normal limits. The
patient is status post right-sided maxillary antrectomy, and
right-sided sphenoid antrostomy. The paranasal sinuses and mastoid
air cells are well-aerated. No significant soft tissue abnormalities
are seen.
IMPRESSION: 1. No acute intracranial pathology seen on CT.
2. Mild to moderate cortical volume loss and scattered small vessel
ischemic microangiopathy.

## 2016-04-24 DIAGNOSIS — R2681 Unsteadiness on feet: Secondary | ICD-10-CM | POA: Diagnosis not present

## 2016-04-24 DIAGNOSIS — M6281 Muscle weakness (generalized): Secondary | ICD-10-CM | POA: Diagnosis not present

## 2016-04-24 DIAGNOSIS — R471 Dysarthria and anarthria: Secondary | ICD-10-CM | POA: Diagnosis not present

## 2016-04-24 DIAGNOSIS — R41841 Cognitive communication deficit: Secondary | ICD-10-CM | POA: Diagnosis not present

## 2016-04-25 DIAGNOSIS — M6281 Muscle weakness (generalized): Secondary | ICD-10-CM | POA: Diagnosis not present

## 2016-04-25 DIAGNOSIS — R41841 Cognitive communication deficit: Secondary | ICD-10-CM | POA: Diagnosis not present

## 2016-04-25 DIAGNOSIS — R2681 Unsteadiness on feet: Secondary | ICD-10-CM | POA: Diagnosis not present

## 2016-04-25 DIAGNOSIS — R471 Dysarthria and anarthria: Secondary | ICD-10-CM | POA: Diagnosis not present

## 2016-04-26 DIAGNOSIS — R41841 Cognitive communication deficit: Secondary | ICD-10-CM | POA: Diagnosis not present

## 2016-04-26 DIAGNOSIS — R471 Dysarthria and anarthria: Secondary | ICD-10-CM | POA: Diagnosis not present

## 2016-04-26 DIAGNOSIS — M6281 Muscle weakness (generalized): Secondary | ICD-10-CM | POA: Diagnosis not present

## 2016-04-26 DIAGNOSIS — R2681 Unsteadiness on feet: Secondary | ICD-10-CM | POA: Diagnosis not present

## 2016-04-27 ENCOUNTER — Encounter: Payer: Self-pay | Admitting: *Deleted

## 2016-04-27 DIAGNOSIS — R41841 Cognitive communication deficit: Secondary | ICD-10-CM | POA: Diagnosis not present

## 2016-04-27 DIAGNOSIS — R2681 Unsteadiness on feet: Secondary | ICD-10-CM | POA: Diagnosis not present

## 2016-04-27 DIAGNOSIS — R471 Dysarthria and anarthria: Secondary | ICD-10-CM | POA: Diagnosis not present

## 2016-04-27 DIAGNOSIS — M6281 Muscle weakness (generalized): Secondary | ICD-10-CM | POA: Diagnosis not present

## 2016-04-28 DIAGNOSIS — R41841 Cognitive communication deficit: Secondary | ICD-10-CM | POA: Diagnosis not present

## 2016-04-28 DIAGNOSIS — R2681 Unsteadiness on feet: Secondary | ICD-10-CM | POA: Diagnosis not present

## 2016-04-28 DIAGNOSIS — M6281 Muscle weakness (generalized): Secondary | ICD-10-CM | POA: Diagnosis not present

## 2016-04-28 DIAGNOSIS — R471 Dysarthria and anarthria: Secondary | ICD-10-CM | POA: Diagnosis not present

## 2016-05-04 ENCOUNTER — Non-Acute Institutional Stay: Payer: Medicare Other | Admitting: Nurse Practitioner

## 2016-05-04 ENCOUNTER — Encounter: Payer: Self-pay | Admitting: Nurse Practitioner

## 2016-05-04 DIAGNOSIS — I10 Essential (primary) hypertension: Secondary | ICD-10-CM

## 2016-05-04 DIAGNOSIS — F329 Major depressive disorder, single episode, unspecified: Secondary | ICD-10-CM

## 2016-05-04 DIAGNOSIS — G309 Alzheimer's disease, unspecified: Secondary | ICD-10-CM | POA: Diagnosis not present

## 2016-05-04 DIAGNOSIS — F323 Major depressive disorder, single episode, severe with psychotic features: Secondary | ICD-10-CM | POA: Insufficient documentation

## 2016-05-04 DIAGNOSIS — K219 Gastro-esophageal reflux disease without esophagitis: Secondary | ICD-10-CM

## 2016-05-04 DIAGNOSIS — R197 Diarrhea, unspecified: Secondary | ICD-10-CM | POA: Insufficient documentation

## 2016-05-04 DIAGNOSIS — F0280 Dementia in other diseases classified elsewhere without behavioral disturbance: Secondary | ICD-10-CM

## 2016-05-04 DIAGNOSIS — F0393 Unspecified dementia, unspecified severity, with mood disturbance: Secondary | ICD-10-CM

## 2016-05-04 NOTE — Assessment & Plan Note (Signed)
Stable, continue Celexa

## 2016-05-04 NOTE — Assessment & Plan Note (Signed)
Stable, continue Zantac.

## 2016-05-04 NOTE — Assessment & Plan Note (Addendum)
Prn Imodium is available to her. Obtain stool culture, C-diff toxin A/B x 3, CBC CMP TSH, UA C/S,  Observe the patient.

## 2016-05-04 NOTE — Assessment & Plan Note (Signed)
Controlled, continue Amlodipine, update CBC CMP TSH

## 2016-05-04 NOTE — Assessment & Plan Note (Signed)
Not on memory preserving meds, obtain MMSE

## 2016-05-04 NOTE — Progress Notes (Signed)
Provider:  Marlana Latus NP Location:      Place of Service:  Clinic (12)  PCP: Pcp Not In System Patient Care Team: Pcp Not In System as PCP - General Stark Klein, MD as Consulting Physician (General Surgery) Chauncey Cruel, MD as Consulting Physician (Hematology and Oncology) Kyung Rudd, MD as Consulting Physician (Radiation Oncology) Luberta Mutter, MD as Consulting Physician (Ophthalmology)  Extended Emergency Contact Information Primary Emergency Contact: [name],[name] Address: [address]          [place], [place] [address] [place] Phone: [phone] Work Phone: [phone] Relation: Daughter  Code Status: DNR Goals of Care: Advanced Directive information Advanced Directives 05/04/2016  Does Patient Have a Medical Advance Directive? Yes  Type of Advance Directive Living will;Healthcare Power of Attorney  Does patient want to make changes to medical advance directive? No - Patient declined  Copy of [place] in Chart? Yes  Would patient like information on creating a medical advance directive? -      Chief Complaint  Patient presents with  . New Admit To SNF    HPI: Patient is a 83 y.o. female seen today for admission to [facility] service.   C/o diarrhea, denied abd pain, vomiting, she is afebrile, no O2 desaturation.   Hx of dementia, not taking memory preserving meds, resides in AL for care assistance. HTN controlled on Amlodipine. GERD, taking Zantac. Mood is controlled on Celexa.   Past Medical History:  Diagnosis Date  . Abnormal weight gain   . Abnormality of gait   . Acute upper respiratory infections of unspecified site   . Anemia, unspecified   . Appendicitis   . Arthritis   . Chronic kidney disease, stage III (moderate)   . Dementia in conditions classified elsewhere without behavioral disturbance   . Depression   . Diverticulitis   . Diverticulosis of colon (without mention of hemorrhage)   . Dizziness and  giddiness   . Edema of leg   . GERD (gastroesophageal reflux disease)   . Glaucoma   . Hyperlipidemia LDL goal < 100   . Hypertension   . Irritable bowel syndrome   . Irritable bowel syndrome   . Leukocytopenia, unspecified   . Malignant neoplasm of breast (female), unspecified site   . Memory loss   . Other B-complex deficiencies   . Other malaise and fatigue   . Other specified symptom associated with female genital organs   . Personal history of fall   . Polymyalgia rheumatica ([place])   . Regional enteritis of unspecified site   . Sciatica   . Sciatica   . Sebaceous cyst   . Unspecified arthropathy, shoulder region   . Unspecified glaucoma(365.9)   . Unspecified vitamin D deficiency   . Urge incontinence   . Vertigo    Past Surgical History:  Procedure Laterality Date  . BREAST LUMPECTOMY  10/28/10   left breast   . BREAST SURGERY     R breast lumpectomy for benign disease  . CATARACT EXTRACTION  1990  . CHOLECYSTECTOMY  1972  . NASAL SINUS SURGERY  1966    reports that she has never smoked. She has never used smokeless tobacco. She reports that she does not drink alcohol or use drugs. Social History   Social History  . Marital status: Widowed    Spouse name: N/A  . Number of children: N/A  . Years of education: N/A   Occupational History  . retired  Social History Main Topics  . Smoking status: Never Smoker  . Smokeless tobacco: Never Used  . Alcohol use No  . Drug use: No  . Sexual activity: No   Other Topics Concern  . Not on file   Social History Narrative  . No narrative on file    Functional Status Survey:    Family History  Problem Relation Age of Onset  . Stroke Father   . Hypertension Mother   . Stroke Mother   . Breast cancer Mother   . Heart attack Brother   . Cancer Brother   . Stroke Brother   . Heart attack Son   . Colon cancer Sister     Health Maintenance  Topic Date Due  . Samul Dada  03/16/1939  . INFLUENZA VACCINE   08/30/2016  . DEXA SCAN  Completed  . PNA vac Low Risk Adult  Completed    Allergies  Allergen Reactions  . Codeine     States she blacks out. Listed on MAR    Allergies as of 05/04/2016      Reactions   Codeine    States she blacks out. Listed on [facility]      Medication List       Accurate as of 05/04/16  4:32 PM. Always use your most recent med list.          acetaminophen 500 MG tablet Commonly known as:  TYLENOL Take 1 tablet (500 mg total) by mouth every 6 (six) hours as needed for mild pain or moderate pain.   amLODipine 5 MG tablet Commonly known as:  NORVASC Take 5 mg by mouth daily.   amLODipine 5 MG tablet Commonly known as:  NORVASC Take 5 mg by mouth daily.   amLODipine 10 MG tablet Commonly known as:  NORVASC Take 1 tablet (10 mg total) by mouth daily with breakfast.   calcium carbonate 1250 MG capsule Take 1,250 mg by mouth daily.   calcium carbonate 500 MG chewable tablet Commonly known as:  TUMS - dosed in mg elemental calcium Chew 1 tablet by mouth daily.   cetirizine 10 MG tablet Commonly known as:  ZYRTEC Take 10 mg by mouth at bedtime.   citalopram 10 MG tablet Commonly known as:  CELEXA Take 1 tablet (10 mg total) by mouth daily.   CLARITIN 10 MG Caps Generic drug:  Loratadine Take 10 mg by mouth daily.   COMBIGAN 0.2-0.5 % ophthalmic solution Generic drug:  brimonidine-timolol Place 1 drop into both eyes 2 (two) times daily.   diclofenac sodium 1 % Gel Commonly known as:  VOLTAREN Apply 2 g topically 3 (three) times daily.   dorzolamide 2 % ophthalmic solution Commonly known as:  TRUSOPT Place 1 drop into both eyes 2 (two) times daily.   lidocaine 5 % Commonly known as:  LIDODERM APPLY 1 PATCH TO EACH SHOULDER EVERY MORNING, KEEP ON FOR 12 HOURS AND THEN REMOVE FROM EACH SHOULDER AT BEDTIME   loperamide 2 MG capsule Commonly known as:  IMODIUM Take 2 mg by mouth as needed for diarrhea or loose stools. Reported on 06/30/2015    multivitamin with minerals Tabs tablet Take 1 tablet by mouth daily with breakfast.   ranitidine 150 MG tablet Commonly known as:  ZANTAC Take 150 mg by mouth daily as needed for heartburn.       Review of Systems  Constitutional: Positive for activity change and appetite change. Negative for diaphoresis, fatigue, fever and unexpected weight change.  HENT:  Positive for hearing loss. Negative for congestion, dental problem, drooling, ear discharge, ear pain, facial swelling, mouth sores, nosebleeds, postnasal drip, rhinorrhea, sinus pain, sinus pressure, sneezing, sore throat, tinnitus, trouble swallowing and voice change.   Eyes: Negative for photophobia, pain, discharge, redness, itching and visual disturbance.  Respiratory: Positive for cough. Negative for apnea, choking, chest tightness, shortness of breath and wheezing.   Cardiovascular: Negative for chest pain, palpitations and leg swelling.  Gastrointestinal: Positive for diarrhea. Negative for abdominal distention, abdominal pain, anal bleeding, blood in stool, constipation, nausea, rectal pain and vomiting.  Endocrine: Negative for cold intolerance, heat intolerance, polydipsia, polyphagia and polyuria.  Genitourinary: Negative for decreased urine volume, difficulty urinating, dysuria, flank pain, frequency, hematuria and urgency.  Musculoskeletal: Positive for arthralgias, back pain and gait problem.       W/c for mobility.   Skin: Negative for color change, pallor, rash and wound.       Dark circle around her eyes.   Allergic/Immunologic: Positive for food allergies. Negative for environmental allergies and immunocompromised state.  Neurological: Negative for dizziness, tremors, seizures, syncope, facial asymmetry, speech difficulty, weakness, light-headedness and numbness.  Hematological: Negative for adenopathy. Does not bruise/bleed easily.  Psychiatric/Behavioral: Positive for confusion. Negative for agitation, behavioral  problems, hallucinations, self-injury, sleep disturbance and suicidal ideas. The patient is nervous/anxious. The patient is not hyperactive.     Vitals:   05/04/16 1541  BP: 132/80  Pulse: 82  Resp: 20  Temp: 97.6 F (36.4 C)  TempSrc: Oral  SpO2: 98%  Weight: 197 lb (89.4 kg)  Height: 5\' 5"  (1.651 m)   Body mass index is 32.78 kg/m. Physical Exam  Constitutional: She appears well-developed and well-nourished. No distress.  Eyes: Conjunctivae and EOM are normal. Pupils are equal, round, and reactive to light. Right eye exhibits no discharge. Left eye exhibits no discharge.  Neck: Normal range of motion. Neck supple. No JVD present. No tracheal deviation present. No thyromegaly present.  Cardiovascular: Normal rate, regular rhythm, normal heart sounds and intact distal pulses.   Pulmonary/Chest: Effort normal and breath sounds normal. No stridor. No respiratory distress. She has no wheezes. She has no rales.  Abdominal: Soft. Bowel sounds are normal. She exhibits no distension and no mass. There is no tenderness.  Musculoskeletal: Normal range of motion.  Difficulty standing on her own, w/c for mobility.   Neurological: She is alert.  Skin: Skin is warm and dry.  Psychiatric: She has a normal mood and affect. Her behavior is normal.  Feel anxious at times    Labs reviewed: Basic Metabolic Panel:  Recent Labs  09/12/15 1335  NA 142  K 4.4  CL 109  CO2 27  GLUCOSE 87  BUN 17  CREATININE 1.22*  CALCIUM 9.6   Liver Function Tests: No results for input(s): AST, ALT, ALKPHOS, BILITOT, PROT, ALBUMIN in the last 8760 hours. No results for input(s): LIPASE, AMYLASE in the last 8760 hours. No results for input(s): AMMONIA in the last 8760 hours. CBC:  Recent Labs  09/12/15 1335  WBC 3.8*  HGB 10.9*  HCT 36.5  MCV 100.6*  PLT 166   Cardiac Enzymes: No results for input(s): CKTOTAL, CKMB, CKMBINDEX, TROPONINI in the last 8760 hours. BNP: Invalid input(s):  POCBNP Lab Results  Component Value Date   HGBA1C 5.4 06/04/2014   Lab Results  Component Value Date   TSH 1.70 06/04/2014   No results found for: VITAMINB12 No results found for: FOLATE No results found for: IRON, TIBC,  FERRITIN  Imaging and Procedures obtained prior to SNF admission: Dg Chest 2 View  Result Date: 09/12/2015 CLINICAL DATA:  Pt reports left sided chest pain, SOB and productive cough x a few days. Pt unable to describe pain. EXAM: CHEST  2 VIEW COMPARISON:  02/10/2014 FINDINGS: Cardiac silhouette is mildly enlarged. Aorta is uncoiled. No mediastinal or hilar masses or evidence of adenopathy. Clear lungs.  No pleural effusion or pneumothorax. Bony thorax is demineralized but grossly intact. IMPRESSION: No acute cardiopulmonary disease. Electronically Signed   By: Lajean Manes M.D.   On: 09/12/2015 13:49    Assessment/Plan  HTN (hypertension) Controlled, continue Amlodipine, update CBC CMP TSH  Alzheimer's dementia Not on memory preserving meds, obtain MMSE  Depression due to dementia Stable, continue Celexa  GERD (gastroesophageal reflux disease) Stable, continue Zantac.   Diarrhea Prn Imodium is available to her. Obtain stool culture, C-diff toxin A/B x 3, CBC CMP TSH, UA C/S,  Observe the patient.   Family/ staff Communication: AL   Labs/tests ordered: CBC CMP TSH UA C/S stool culture x3, C-diff Toxin A/B x3 if diarrhea persists.

## 2016-05-05 DIAGNOSIS — G309 Alzheimer's disease, unspecified: Secondary | ICD-10-CM | POA: Diagnosis not present

## 2016-05-05 DIAGNOSIS — I10 Essential (primary) hypertension: Secondary | ICD-10-CM | POA: Diagnosis not present

## 2016-05-05 DIAGNOSIS — N390 Urinary tract infection, site not specified: Secondary | ICD-10-CM | POA: Diagnosis not present

## 2016-05-05 DIAGNOSIS — E785 Hyperlipidemia, unspecified: Secondary | ICD-10-CM | POA: Diagnosis not present

## 2016-05-06 DIAGNOSIS — M1991 Primary osteoarthritis, unspecified site: Secondary | ICD-10-CM | POA: Diagnosis not present

## 2016-05-06 DIAGNOSIS — E785 Hyperlipidemia, unspecified: Secondary | ICD-10-CM | POA: Diagnosis not present

## 2016-05-06 DIAGNOSIS — K219 Gastro-esophageal reflux disease without esophagitis: Secondary | ICD-10-CM | POA: Diagnosis not present

## 2016-05-07 DIAGNOSIS — K219 Gastro-esophageal reflux disease without esophagitis: Secondary | ICD-10-CM | POA: Diagnosis not present

## 2016-05-07 DIAGNOSIS — E785 Hyperlipidemia, unspecified: Secondary | ICD-10-CM | POA: Diagnosis not present

## 2016-05-07 DIAGNOSIS — M1991 Primary osteoarthritis, unspecified site: Secondary | ICD-10-CM | POA: Diagnosis not present

## 2016-05-09 DIAGNOSIS — K219 Gastro-esophageal reflux disease without esophagitis: Secondary | ICD-10-CM | POA: Diagnosis not present

## 2016-05-09 DIAGNOSIS — E785 Hyperlipidemia, unspecified: Secondary | ICD-10-CM | POA: Diagnosis not present

## 2016-05-09 LAB — HEPATIC FUNCTION PANEL
ALK PHOS: 66 U/L (ref 25–125)
ALT: 9 U/L (ref 7–35)
AST: 12 U/L — AB (ref 13–35)
Bilirubin, Total: 0.6 mg/dL

## 2016-05-09 LAB — BASIC METABOLIC PANEL
BUN: 30 mg/dL — AB (ref 4–21)
Creatinine: 1.6 mg/dL — AB (ref <=1.1)
Glucose: 83 mg/dL
POTASSIUM: 4.9 mmol/L (ref 3.4–5.3)
Sodium: 142 mmol/L (ref 137–147)

## 2016-05-09 LAB — CBC AND DIFFERENTIAL
HCT: 31 % — AB (ref 36–46)
HEMOGLOBIN: 10 g/dL — AB (ref 12.0–16.0)
PLATELETS: 149 10*3/uL — AB (ref 150–399)
WBC: 4.1 10*3/mL

## 2016-05-09 LAB — TSH: TSH: 2.03 u[IU]/mL (ref <=5.90)

## 2016-05-10 ENCOUNTER — Encounter: Payer: Self-pay | Admitting: Nurse Practitioner

## 2016-05-10 ENCOUNTER — Other Ambulatory Visit: Payer: Self-pay | Admitting: *Deleted

## 2016-05-10 DIAGNOSIS — D638 Anemia in other chronic diseases classified elsewhere: Secondary | ICD-10-CM | POA: Insufficient documentation

## 2016-05-19 DIAGNOSIS — H401133 Primary open-angle glaucoma, bilateral, severe stage: Secondary | ICD-10-CM | POA: Diagnosis not present

## 2016-05-29 ENCOUNTER — Non-Acute Institutional Stay: Payer: Medicare Other | Admitting: Internal Medicine

## 2016-05-29 ENCOUNTER — Encounter: Payer: Self-pay | Admitting: Internal Medicine

## 2016-05-29 DIAGNOSIS — R202 Paresthesia of skin: Secondary | ICD-10-CM | POA: Diagnosis not present

## 2016-05-29 DIAGNOSIS — D631 Anemia in chronic kidney disease: Secondary | ICD-10-CM

## 2016-05-29 DIAGNOSIS — R262 Difficulty in walking, not elsewhere classified: Secondary | ICD-10-CM | POA: Diagnosis not present

## 2016-05-29 DIAGNOSIS — F329 Major depressive disorder, single episode, unspecified: Secondary | ICD-10-CM

## 2016-05-29 DIAGNOSIS — F0280 Dementia in other diseases classified elsewhere without behavioral disturbance: Secondary | ICD-10-CM

## 2016-05-29 DIAGNOSIS — I10 Essential (primary) hypertension: Secondary | ICD-10-CM

## 2016-05-29 DIAGNOSIS — G309 Alzheimer's disease, unspecified: Secondary | ICD-10-CM

## 2016-05-29 DIAGNOSIS — N179 Acute kidney failure, unspecified: Secondary | ICD-10-CM

## 2016-05-29 DIAGNOSIS — F0393 Unspecified dementia, unspecified severity, with mood disturbance: Secondary | ICD-10-CM

## 2016-05-29 DIAGNOSIS — R197 Diarrhea, unspecified: Secondary | ICD-10-CM | POA: Diagnosis not present

## 2016-05-29 DIAGNOSIS — N183 Chronic kidney disease, stage 3 (moderate): Secondary | ICD-10-CM

## 2016-05-29 DIAGNOSIS — N189 Chronic kidney disease, unspecified: Secondary | ICD-10-CM

## 2016-05-29 MED ORDER — DIPHENOXYLATE-ATROPINE 2.5-0.025 MG PO TABS: ORAL_TABLET | ORAL | 0 refills | Status: DC

## 2016-05-29 MED ORDER — HYOSCYAMINE SULFATE 0.125 MG PO TABS: ORAL_TABLET | ORAL | 5 refills | Status: DC

## 2016-05-29 NOTE — Progress Notes (Signed)
History and Physical     Location:  [place] Room Number: [id number] Place of Service:  ALF [phone])  PCP: Michelle Hubert, MD Patient Care Team: Michelle Dooms, MD as PCP - General (Internal Medicine) Michelle Klein, MD as Consulting Physician (General Surgery) Michelle Cruel, MD as Consulting Physician (Hematology and Oncology) Michelle Rudd, MD as Consulting Physician (Radiation Oncology) Michelle Mutter, MD as Consulting Physician (Ophthalmology) Michelle Otho Darner, NP as Nurse Practitioner (Internal Medicine)  Extended Emergency Contact Information Primary Emergency Contact: [name],Michelle Address: [address]          [place], [place] [address] [place] Phone: [phone] Work Phone: [phone] Relation: Daughter  Code Status: Full Code Goals of Care: Advanced Directive information Advanced Directives 05/29/2016  Does Patient Have a Medical Advance Directive? Yes  Type of Advance Directive [place]  Does patient want to make changes to medical advance directive? -  Copy of [place] in Chart? Yes  Would patient like information on creating a medical advance directive? -      Chief Complaint  Patient presents with  . New Admit to AL    HPI: Patient is a 83 y.o. female seen today for admission to [facility] on 04/21/16.   Depression due to dementia 0 stable on citalopram  Alzheimer's dementia without behavioral disturbance, unspecified timing of dementia onset - unchanged. Declining memory  Acute renal failure superimposed on stage 3 chronic kidney disease, unspecified acute renal failure type ([place])  -   Diarrhea, unspecified type - distressing to patient. Stool analyses on 05/07/16 negative for C.dificile, E. Coli, salmonella , shigella, campylobacter  Anemia associated with chronic renal failure - last hgb 10.0  Essential hypertension, benign - controlled  Difficulty walking - overweight. Hx  plantar fasciitis.  Urine on 05/07/16 was nitrite positive. Culture E.coli was treated with Septra DS.  CKD - BUN 30, creatinine 1.55  Dyspneic at rest today. No chest pain or cough.    Past Medical History:  Diagnosis Date  . Abnormal weight gain   . Abnormality of gait   . Acute upper respiratory infections of unspecified site   . Anemia, unspecified   . Appendicitis   . Arthritis   . Chronic kidney disease, stage III (moderate)   . Dementia in conditions classified elsewhere without behavioral disturbance   . Depression   . Diverticulitis   . Diverticulosis of colon (without mention of hemorrhage)   . Dizziness and giddiness   . Edema of leg   . GERD (gastroesophageal reflux disease)   . Glaucoma   . Hyperlipidemia LDL goal < 100   . Hypertension   . Irritable bowel syndrome   . Irritable bowel syndrome   . Leukocytopenia, unspecified   . Malignant neoplasm of breast (female), unspecified site   . Memory loss   . Other B-complex deficiencies   . Other malaise and fatigue   . Other specified symptom associated with female genital organs   . Personal history of fall   . Polymyalgia rheumatica ([place])   . Regional enteritis of unspecified site   . Sciatica   . Sciatica   . Sebaceous cyst   . Unspecified arthropathy, shoulder region   . Unspecified glaucoma(365.9)   . Unspecified vitamin D deficiency   . Urge incontinence   . Vertigo    Past Surgical History:  Procedure Laterality Date  . BREAST LUMPECTOMY  10/28/10   left breast   .  BREAST SURGERY     R breast lumpectomy for benign disease  . CATARACT EXTRACTION  1990  . CHOLECYSTECTOMY  1972  . NASAL SINUS SURGERY  1966    reports that she has never smoked. She has never used smokeless tobacco. She reports that she does not drink alcohol or use drugs. Social History   Social History  . Marital status: Widowed    Spouse name: N/A  . Number of children: N/A  . Years of education: N/A   Occupational  History  . retired    Social History Main Topics  . Smoking status: Never Smoker  . Smokeless tobacco: Never Used  . Alcohol use No  . Drug use: No  . Sexual activity: No   Other Topics Concern  . Not on file   Social History Narrative  . No narrative on file     Family History  Problem Relation Age of Onset  . Stroke Father   . Hypertension Mother   . Stroke Mother   . Breast cancer Mother   . Heart attack Brother   . Cancer Brother   . Stroke Brother   . Heart attack Son   . Colon cancer Sister     Health Maintenance  Topic Date Due  . Samul Dada  03/16/1939  . INFLUENZA VACCINE  08/30/2016  . DEXA SCAN  Completed  . PNA vac Low Risk Adult  Completed    Allergies  Allergen Reactions  . Codeine     States she blacks out. Listed on [facility]    Outpatient Encounter Prescriptions as of 05/29/2016  Medication Sig  . acetaminophen (TYLENOL) 500 MG chewable tablet Chew 500 mg by mouth every 6 (six) hours as needed for pain. Take 2 tablets three times a day  . amLODipine (NORVASC) 5 MG tablet Take 5 mg by mouth daily.  . bimatoprost (LUMIGAN) 0.01 % SOLN Place 1 drop into both eyes. One drop into both eyes at bedtime  . brimonidine-timolol (COMBIGAN) 0.2-0.5 % ophthalmic solution Place 1 drop into both eyes 2 (two) times daily.   . calcium carbonate (TUMS - DOSED IN MG ELEMENTAL CALCIUM) 500 MG chewable tablet Chew 1 tablet by mouth daily.  . chlorhexidine (PERIDEX) 0.12 % solution Use as directed 15 mLs in the mouth or throat. One teaspoon to brush teeth at bedtime  . citalopram (CELEXA) 10 MG tablet Take 1 tablet (10 mg total) by mouth daily.  . diclofenac sodium (VOLTAREN) 1 % GEL Apply 2 g topically 3 (three) times daily.  . dorzolamide (TRUSOPT) 2 % ophthalmic solution Place 1 drop into both eyes 2 (two) times daily.  Marland Kitchen guaiFENesin-codeine (ROBAFEN AC) 100-10 MG/5ML syrup Take by mouth. 4 ml four times daily as needed for cough and con  . lidocaine (LIDODERM) 5 %  APPLY 1 PATCH TO EACH SHOULDER EVERY MORNING, KEEP ON FOR 12 HOURS AND THEN REMOVE FROM EACH SHOULDER AT BEDTIME  . loperamide (IMODIUM) 2 MG capsule Take 2 mg by mouth. Take one capsule after each loose stool max 3 dose per day. One capsule once a day.  . Loratadine (CLARITIN) 10 MG CAPS Take 10 mg by mouth daily.  . Multiple Vitamin (MULTIVITAMIN WITH MINERALS) TABS Take 1 tablet by mouth daily with breakfast.   . ranitidine (ZANTAC) 150 MG tablet Take 150 mg by mouth daily as needed for heartburn.   . sertraline (ZOLOFT) 25 MG tablet Take 25 mg by mouth. Take one tablet every am  . [DISCONTINUED] acetaminophen (  TYLENOL) 500 MG tablet Take 1 tablet (500 mg total) by mouth every 6 (six) hours as needed for mild pain or moderate pain. (Patient taking differently: Take 500 mg by mouth every 6 (six) hours as needed for mild pain or moderate pain. Give 2 tablets thee time a day)  . [DISCONTINUED] amLODipine (NORVASC) 10 MG tablet Take 1 tablet (10 mg total) by mouth daily with breakfast.  . [DISCONTINUED] amLODipine (NORVASC) 5 MG tablet Take 5 mg by mouth daily.  . [DISCONTINUED] calcium carbonate 1250 MG capsule Take 1,250 mg by mouth daily.  . [DISCONTINUED] cetirizine (ZYRTEC) 10 MG tablet Take 10 mg by mouth at bedtime.   No facility-administered encounter medications on file as of 05/29/2016.     Review of Systems  Constitutional: Positive for activity change, appetite change and fatigue. Negative for diaphoresis, fever and unexpected weight change.       Obese  HENT: Positive for hearing loss. Negative for congestion, dental problem, drooling, ear discharge, ear pain, facial swelling, mouth sores, nosebleeds, postnasal drip, rhinorrhea, sinus pain, sinus pressure, sneezing, sore throat, tinnitus, trouble swallowing and voice change.   Eyes: Negative for photophobia, pain, discharge, redness, itching and visual disturbance.  Respiratory: Positive for cough and shortness of breath (at rest).  Negative for apnea, choking, chest tightness and wheezing.   Cardiovascular: Negative for chest pain, palpitations and leg swelling.  Gastrointestinal: Positive for diarrhea. Negative for abdominal distention, abdominal pain, anal bleeding, blood in stool, constipation, nausea, rectal pain and vomiting.  Endocrine: Negative for cold intolerance, heat intolerance, polydipsia, polyphagia and polyuria.  Genitourinary: Negative for decreased urine volume, difficulty urinating, dysuria, flank pain, frequency, hematuria and urgency.       UTI 05/07/16 with E. Coli treated with Septra DS. CKD @  Musculoskeletal: Positive for arthralgias, back pain and gait problem (poor balance).       W/c for mobility. Hx plantar fasciitis.  Skin: Negative for color change, pallor, rash and wound.       Dark circle around her eyes.   Allergic/Immunologic: Positive for food allergies. Negative for environmental allergies and immunocompromised state.  Neurological: Negative for dizziness, tremors, seizures, syncope, facial asymmetry, speech difficulty, weakness, light-headedness and numbness.  Hematological: Negative for adenopathy. Does not bruise/bleed easily.       Anemia  Psychiatric/Behavioral: Positive for confusion. Negative for agitation, behavioral problems, hallucinations, self-injury, sleep disturbance and suicidal ideas. The patient is nervous/anxious. The patient is not hyperactive.     Vitals:   05/29/16 1542  BP: 140/80  Pulse: 80  Resp: 18  Temp: 97.9 F (36.6 C)  Weight: 198 lb 12.8 oz (90.2 kg)  Height: 5\' 5"  (1.651 m)   Body mass index is 33.08 kg/m. Physical Exam  Constitutional: She appears well-developed and well-nourished. No distress.  Eyes: Conjunctivae and EOM are normal. Pupils are equal, round, and reactive to light. Right eye exhibits no discharge. Left eye exhibits no discharge.  Neck: Normal range of motion. Neck supple. No JVD present. No tracheal deviation present. No  thyromegaly present.  Cardiovascular: Normal rate, regular rhythm, normal heart sounds and intact distal pulses.   Pulmonary/Chest: Breath sounds normal. No stridor. She is in respiratory distress. She has no wheezes. She has no rales.  Abdominal: Soft. Bowel sounds are normal. She exhibits no distension and no mass. There is no tenderness.  Musculoskeletal: Normal range of motion.  Difficulty standing on her own, w/c for mobility.   Neurological: She is alert.  dementia  Skin: Skin  is warm and dry.  Psychiatric: She has a normal mood and affect. Her behavior is normal.  Feel anxious at times    Labs reviewed: Basic Metabolic Panel:  Recent Labs  09/12/15 1335 05/09/16  NA 142 142  K 4.4 4.9  CL 109  --   CO2 27  --   GLUCOSE 87  --   BUN 17 30*  CREATININE 1.22* 1.6*  CALCIUM 9.6  --    Liver Function Tests:  Recent Labs  05/09/16  AST 12*  ALT 9  ALKPHOS 66   No results for input(s): LIPASE, AMYLASE in the last 8760 hours. No results for input(s): AMMONIA in the last 8760 hours. CBC:  Recent Labs  09/12/15 1335 05/09/16  WBC 3.8* 4.1  HGB 10.9* 10.0*  HCT 36.5 31*  MCV 100.6*  --   PLT 166 149*   Cardiac Enzymes: No results for input(s): CKTOTAL, CKMB, CKMBINDEX, TROPONINI in the last 8760 hours. BNP: Invalid input(s): POCBNP Lab Results  Component Value Date   HGBA1C 5.4 06/04/2014   Lab Results  Component Value Date   TSH 2.03 05/09/2016   No results found for: VITAMINB12 No results found for: FOLATE No results found for: IRON, TIBC, FERRITIN  Imaging and Procedures obtained prior to SNF admission: Dg Chest 2 View  Result Date: 09/12/2015 CLINICAL DATA:  Pt reports left sided chest pain, SOB and productive cough x a few days. Pt unable to describe pain. EXAM: CHEST  2 VIEW COMPARISON:  02/10/2014 FINDINGS: Cardiac silhouette is mildly enlarged. Aorta is uncoiled. No mediastinal or hilar masses or evidence of adenopathy. Clear lungs.  No pleural  effusion or pneumothorax. Bony thorax is demineralized but grossly intact. IMPRESSION: No acute cardiopulmonary disease. Electronically Signed   By: Lajean Manes M.D.   On: 09/12/2015 13:49    Assessment/Plan 1. Depression due to dementia Continue citalopram. Stop sertraline  2. Alzheimer's dementia without behavioral disturbance, unspecified timing of dementia onset Observe.  3. Acute renal failure superimposed on stage 3 chronic kidney disease, unspecified acute renal failure type ([place]) Follow lab  4. Diarrhea, unspecified type Stop Imodium and start hyoscyamine 0.125 tid with meals and Lomotil 2 tabs bid to prevent diarrhea.  5. Anemia associated with chronic renal failure -CBC, Retic, B12, Folate, Iron panel, Ferritin.  6. Essential hypertension, benign Continue amlodipine  7. Difficulty walking Continue wheelchair.

## 2016-06-04 DIAGNOSIS — N390 Urinary tract infection, site not specified: Secondary | ICD-10-CM | POA: Diagnosis not present

## 2016-06-06 DIAGNOSIS — Z79899 Other long term (current) drug therapy: Secondary | ICD-10-CM | POA: Diagnosis not present

## 2016-06-06 DIAGNOSIS — D509 Iron deficiency anemia, unspecified: Secondary | ICD-10-CM | POA: Diagnosis not present

## 2016-06-06 DIAGNOSIS — R202 Paresthesia of skin: Secondary | ICD-10-CM | POA: Diagnosis not present

## 2016-06-06 LAB — CBC AND DIFFERENTIAL
HEMATOCRIT: 32 % — AB (ref 36–46)
HEMOGLOBIN: 10.2 g/dL — AB (ref 12.0–16.0)
Platelets: 152 10*3/uL (ref 150–399)
WBC: 4 10^3/mL

## 2016-06-15 ENCOUNTER — Other Ambulatory Visit: Payer: Self-pay | Admitting: *Deleted

## 2016-06-29 ENCOUNTER — Non-Acute Institutional Stay: Payer: Medicare Other | Admitting: Nurse Practitioner

## 2016-06-29 ENCOUNTER — Encounter: Payer: Self-pay | Admitting: Nurse Practitioner

## 2016-06-29 DIAGNOSIS — D631 Anemia in chronic kidney disease: Secondary | ICD-10-CM

## 2016-06-29 DIAGNOSIS — F0280 Dementia in other diseases classified elsewhere without behavioral disturbance: Secondary | ICD-10-CM

## 2016-06-29 DIAGNOSIS — M353 Polymyalgia rheumatica: Secondary | ICD-10-CM | POA: Diagnosis not present

## 2016-06-29 DIAGNOSIS — N183 Chronic kidney disease, stage 3 (moderate): Secondary | ICD-10-CM

## 2016-06-29 DIAGNOSIS — N179 Acute kidney failure, unspecified: Secondary | ICD-10-CM

## 2016-06-29 DIAGNOSIS — G309 Alzheimer's disease, unspecified: Secondary | ICD-10-CM

## 2016-06-29 DIAGNOSIS — I10 Essential (primary) hypertension: Secondary | ICD-10-CM | POA: Diagnosis not present

## 2016-06-29 DIAGNOSIS — R197 Diarrhea, unspecified: Secondary | ICD-10-CM

## 2016-06-29 DIAGNOSIS — F329 Major depressive disorder, single episode, unspecified: Secondary | ICD-10-CM

## 2016-06-29 DIAGNOSIS — K219 Gastro-esophageal reflux disease without esophagitis: Secondary | ICD-10-CM

## 2016-06-29 DIAGNOSIS — N189 Chronic kidney disease, unspecified: Secondary | ICD-10-CM

## 2016-06-29 DIAGNOSIS — F0393 Unspecified dementia, unspecified severity, with mood disturbance: Secondary | ICD-10-CM

## 2016-06-29 NOTE — Assessment & Plan Note (Signed)
Stable, continue Zantac.

## 2016-06-29 NOTE — Assessment & Plan Note (Signed)
Last Hgb 12.6 06/07/16

## 2016-06-29 NOTE — Assessment & Plan Note (Signed)
Aches and pains, Tylenol prn

## 2016-06-29 NOTE — Assessment & Plan Note (Signed)
Last creat 1.55 05/09/16

## 2016-06-29 NOTE — Assessment & Plan Note (Signed)
Controlled, continue Amlodipine 5mg  qd

## 2016-06-29 NOTE — Assessment & Plan Note (Signed)
Managed with Levsin 0.125mg , prn Lomotil

## 2016-06-29 NOTE — Assessment & Plan Note (Signed)
Prn Tylenol is available to her

## 2016-06-29 NOTE — Assessment & Plan Note (Signed)
Not on memory preserving meds, obtain MMSE

## 2016-06-29 NOTE — Progress Notes (Signed)
Location:  [facility] Room Number: 283 Place of Service:  ALF [phone]) Provider: Luiscarlos Kaczmarczyk, Manxie  NP   Estill Dooms, MD  Patient Care Team: Estill Dooms, MD as PCP - General (Internal Medicine) Stark Klein, MD as Consulting Physician (General Surgery) Magrinat, Virgie Dad, MD as Consulting Physician (Hematology and Oncology) Kyung Rudd, MD as Consulting Physician (Radiation Oncology) Luberta Mutter, MD as Consulting Physician (Ophthalmology) Khloe Hunkele X, NP as Nurse Practitioner (Internal Medicine)  Extended Emergency Contact Information Primary Emergency Contact: [name],[name] Address: [address]          [place], [place] [address] [place] Phone: [phone] Work Phone: [phone] Relation: Daughter  Code Status:  DNR Goals of care: Advanced Directive information Advanced Directives 06/29/2016  Does Patient Have a Medical Advance Directive? Yes  Type of Paramedic of [place];Living will;Out of facility DNR (pink MOST or yellow form)  Does patient want to make changes to medical advance directive? No - Patient declined  Copy of [place] in Chart? Yes  Would patient like information on creating a medical advance directive? -  Pre-existing out of facility DNR order (yellow form or pink MOST form) Yellow form placed in chart (order not valid for inpatient use)     Chief Complaint  Patient presents with  . Acute Visit    depression    HPI:  Pt is a 83 y.o. female seen today for an acute visit for mood instability, the patient appears anxious, irritated, angry, and somehow suspicious since her Celexa 10mg  qd was discontinued. She states "I am going to kill myself" often when she is angry or frustrated, but she has no suicidal plans. She resides in AL and under around clock check.    Hx of dementia, not taking memory preserving meds, resides in AL for care assistance. HTN controlled  on Amlodipine 5mg  qd. GERD, taking Zantac. Diarrhea,  on Levsin 0.125mg , Lomotil prn   Past Medical History:  Diagnosis Date  . Abnormal weight gain   . Abnormality of gait   . Acute upper respiratory infections of unspecified site   . Anemia, unspecified   . Appendicitis   . Arthritis   . Chronic kidney disease, stage III (moderate)   . Dementia in conditions classified elsewhere without behavioral disturbance   . Depression   . Diverticulitis   . Diverticulosis of colon (without mention of hemorrhage)   . Dizziness and giddiness   . Edema of leg   . GERD (gastroesophageal reflux disease)   . Glaucoma   . Hyperlipidemia LDL goal < 100   . Hypertension   . Irritable bowel syndrome   . Irritable bowel syndrome   . Leukocytopenia, unspecified   . Malignant neoplasm of breast (female), unspecified site   . Memory loss   . Other B-complex deficiencies   . Other malaise and fatigue   . Other specified symptom associated with female genital organs   . Personal history of fall   . Polymyalgia rheumatica ([place])   . Regional enteritis of unspecified site   . Sciatica   . Sciatica   . Sebaceous cyst   . Unspecified arthropathy, shoulder region   . Unspecified glaucoma(365.9)   . Unspecified vitamin D deficiency   . Urge incontinence   . Vertigo    Past Surgical History:  Procedure Laterality Date  . BREAST LUMPECTOMY  10/28/10   left breast   . BREAST SURGERY  R breast lumpectomy for benign disease  . CATARACT EXTRACTION  1990  . CHOLECYSTECTOMY  1972  . NASAL SINUS SURGERY  1966    Allergies  Allergen Reactions  . Codeine     States she blacks out. Listed on [facility]    Outpatient Encounter Prescriptions as of 06/29/2016  Medication Sig  . acetaminophen (TYLENOL) 500 MG chewable tablet Chew 500 mg by mouth every 6 (six) hours as needed for pain. Take 2 tablets three times a day  . amLODipine (NORVASC) 5 MG tablet Take 5 mg by mouth daily.  . bimatoprost (LUMIGAN) 0.01  % SOLN Place 1 drop into both eyes. One drop into both eyes at bedtime  . brimonidine-timolol (COMBIGAN) 0.2-0.5 % ophthalmic solution Place 1 drop into both eyes 2 (two) times daily.   . calcium carbonate (TUMS - DOSED IN MG ELEMENTAL CALCIUM) 500 MG chewable tablet Chew 1 tablet by mouth daily.  . chlorhexidine (PERIDEX) 0.12 % solution Use as directed 15 mLs in the mouth or throat. One teaspoon to brush teeth at bedtime  . citalopram (CELEXA) 10 MG tablet Take 1 tablet (10 mg total) by mouth daily.  . diclofenac sodium (VOLTAREN) 1 % GEL Apply 2 g topically 3 (three) times daily.  . diphenoxylate-atropine (LOMOTIL) 2.5-0.025 MG tablet 2 tablets twice daily to prevent diarrhea  . dorzolamide (TRUSOPT) 2 % ophthalmic solution Place 1 drop into both eyes 2 (two) times daily.  Marland Kitchen guaiFENesin-codeine (ROBAFEN AC) 100-10 MG/5ML syrup Take by mouth. 4 ml four times daily as needed for cough and con  . hyoscyamine (LEVSIN, ANASPAZ) 0.125 MG tablet 1 tablet before each meal to help relieve gas and abdominal discomfort  . lidocaine (LIDODERM) 5 % APPLY 1 PATCH TO EACH SHOULDER EVERY MORNING, KEEP ON FOR 12 HOURS AND THEN REMOVE FROM EACH SHOULDER AT BEDTIME  . Loratadine (CLARITIN) 10 MG CAPS Take 10 mg by mouth daily.  . Multiple Vitamin (MULTIVITAMIN WITH MINERALS) TABS Take 1 tablet by mouth daily with breakfast.   . ranitidine (ZANTAC) 150 MG tablet Take 150 mg by mouth daily as needed for heartburn.    No facility-administered encounter medications on file as of 06/29/2016.     Review of Systems  Constitutional: Positive for activity change, appetite change and fatigue. Negative for diaphoresis, fever and unexpected weight change.       Obese  HENT: Positive for hearing loss. Negative for congestion, dental problem, drooling, ear discharge, ear pain, facial swelling, mouth sores, nosebleeds, postnasal drip, rhinorrhea, sinus pain, sinus pressure, sneezing, sore throat, tinnitus, trouble swallowing  and voice change.   Eyes: Negative for photophobia, pain, discharge, redness, itching and visual disturbance.  Respiratory: Positive for cough and shortness of breath (at rest). Negative for apnea, choking, chest tightness and wheezing.   Cardiovascular: Negative for chest pain, palpitations and leg swelling.  Gastrointestinal: Positive for diarrhea. Negative for abdominal distention, abdominal pain, anal bleeding, blood in stool, constipation, nausea, rectal pain and vomiting.  Endocrine: Negative for cold intolerance, heat intolerance, polydipsia, polyphagia and polyuria.  Genitourinary: Negative for decreased urine volume, difficulty urinating, dysuria, flank pain, frequency, hematuria and urgency.       UTI 05/07/16 with E. Coli treated with Septra DS. CKD @  Musculoskeletal: Positive for arthralgias, back pain and gait problem (poor balance).       W/c for mobility. Hx plantar fasciitis.  Skin: Negative for color change, pallor, rash and wound.       Dark circle around her eyes.  Allergic/Immunologic: Positive for food allergies. Negative for environmental allergies and immunocompromised state.  Neurological: Negative for dizziness, tremors, seizures, syncope, facial asymmetry, speech difficulty, weakness, light-headedness and numbness.  Hematological: Negative for adenopathy. Does not bruise/bleed easily.       Anemia  Psychiatric/Behavioral: Positive for confusion and suicidal ideas. Negative for agitation, behavioral problems, hallucinations, self-injury and sleep disturbance. The patient is nervous/anxious. The patient is not hyperactive.     Immunization History  Administered Date(s) Administered  . Influenza,inj,Quad PF,36+ Mos 11/07/2012, 10/03/2013  . Influenza-Unspecified 12/30/2009, 11/17/2010, 11/30/2011  . Pneumococcal Conjugate-13 01/05/2014  . Pneumococcal Polysaccharide-23 01/30/2006   Pertinent  Health Maintenance Due  Topic Date Due  . INFLUENZA VACCINE  08/30/2016    . DEXA SCAN  Completed  . PNA vac Low Risk Adult  Completed   Fall Risk  05/29/2016 01/05/2014 10/03/2013 09/05/2012 09/05/2012  Falls in the past year? Yes Yes Yes Yes Yes  Number falls in past yr: 2 or more 2 or more 2 or more - 2 or more  Injury with Fall? Yes - - - Yes  Risk Factor Category  High Fall Risk - High Fall Risk - High Fall Risk  Risk for fall due to : - - History of fall(s);Impaired balance/gait;Impaired mobility;Impaired vision;Mental status change - Impaired balance/gait;Impaired mobility   Functional Status Survey:    Vitals:   06/29/16 1604  BP: 128/80  Pulse: 76  Resp: 20  Temp: 97.4 F (36.3 C)  Weight: 194 lb (88 kg)  Height: 5\' 5"  (1.651 m)   Body mass index is 32.28 kg/m. Physical Exam  Constitutional: She appears well-developed and well-nourished. No distress.  Eyes: Conjunctivae and EOM are normal. Pupils are equal, round, and reactive to light. Right eye exhibits no discharge. Left eye exhibits no discharge.  Neck: Normal range of motion. Neck supple. No JVD present. No tracheal deviation present. No thyromegaly present.  Cardiovascular: Normal rate, regular rhythm, normal heart sounds and intact distal pulses.   Pulmonary/Chest: Breath sounds normal. No stridor. She is in respiratory distress. She has no wheezes. She has no rales.  Abdominal: Soft. Bowel sounds are normal. She exhibits no distension and no mass. There is no tenderness.  Musculoskeletal: Normal range of motion.  Difficulty standing on her own, w/c for mobility.   Neurological: She is alert.  dementia  Skin: Skin is warm and dry.  Psychiatric: She has a normal mood and affect. Her behavior is normal.  Feel anxious at times    Labs reviewed:  Recent Labs  09/12/15 1335 05/09/16  NA 142 142  K 4.4 4.9  CL 109  --   CO2 27  --   GLUCOSE 87  --   BUN 17 30*  CREATININE 1.22* 1.6*  CALCIUM 9.6  --     Recent Labs  05/09/16  AST 12*  ALT 9  ALKPHOS 66    Recent Labs   09/12/15 1335 05/09/16 06/06/16  WBC 3.8* 4.1 4.0  HGB 10.9* 10.0* 10.2*  HCT 36.5 31* 32*  MCV 100.6*  --   --   PLT 166 149* 152   Lab Results  Component Value Date   TSH 2.03 05/09/2016   Lab Results  Component Value Date   HGBA1C 5.4 06/04/2014   Lab Results  Component Value Date   CHOL 193 06/04/2014   HDL 49 06/04/2014   LDLCALC 124 06/04/2014   TRIG 102 06/04/2014   CHOLHDL 2.7 06/27/2012    Significant Diagnostic Results in  last 30 days:  No results found.  Assessment/Plan Essential hypertension, benign Controlled, continue Amlodipine 5mg  qd  GERD (gastroesophageal reflux disease) Stable, continue Zantac.   Alzheimer's dementia Not on memory preserving meds, obtain MMSE  Depression due to dementia mood instability, the patient appears anxious, irritated, angry, and somehow suspicious since her Celexa 10mg  qd was discontinued. She states "I am going to kill myself" often when she is angry or frustrated, but she has no suicidal plans. She resides in AL and under around clock check.  Resume Celexa 10mg  qd, update CBC CMP UA C/S  Myalgia and myositis Prn Tylenol is available to her  Acute renal failure superimposed on stage 3 chronic kidney disease (HCC) Last creat 1.55 05/09/16  Anemia associated with chronic renal failure Last Hgb 12.6 06/07/16  Diarrhea Managed with Levsin 0.125mg , prn Lomotil  Polymyalgia rheumatica (HCC) Aches and pains, Tylenol prn     Family/ staff Communication: AL  Labs/tests ordered:  CBC CMP UA C/S

## 2016-06-29 NOTE — Assessment & Plan Note (Addendum)
mood instability, the patient appears anxious, irritated, angry, and somehow suspicious since her Celexa 10mg  qd was discontinued. She states "I am going to kill myself" often when she is angry or frustrated, but she has no suicidal plans. She resides in AL and under around clock check.  Resume Celexa 10mg  qd, update CBC CMP UA C/S

## 2016-06-30 DIAGNOSIS — N390 Urinary tract infection, site not specified: Secondary | ICD-10-CM | POA: Diagnosis not present

## 2016-06-30 DIAGNOSIS — I10 Essential (primary) hypertension: Secondary | ICD-10-CM | POA: Diagnosis not present

## 2016-06-30 LAB — BASIC METABOLIC PANEL
BUN: 22 mg/dL — AB (ref 4–21)
CREATININE: 1.2 mg/dL — AB (ref <=1.1)
Glucose: 87 mg/dL
Potassium: 4.6 mmol/L (ref 3.4–5.3)
Sodium: 141 mmol/L (ref 137–147)

## 2016-06-30 LAB — CBC AND DIFFERENTIAL
HCT: 31 % — AB (ref 36–46)
Hemoglobin: 10 g/dL — AB (ref 12.0–16.0)
PLATELETS: 181 10*3/uL (ref 150–399)
WBC: 4.4 10*3/mL

## 2016-06-30 LAB — HEPATIC FUNCTION PANEL
ALK PHOS: 74 U/L (ref 25–125)
ALT: 9 U/L (ref 7–35)
AST: 15 U/L (ref 13–35)
BILIRUBIN, TOTAL: 0.8 mg/dL

## 2016-07-04 ENCOUNTER — Other Ambulatory Visit: Payer: Self-pay | Admitting: *Deleted

## 2016-07-04 ENCOUNTER — Encounter: Payer: Self-pay | Admitting: Nurse Practitioner

## 2016-07-05 ENCOUNTER — Encounter: Payer: Self-pay | Admitting: Nurse Practitioner

## 2016-07-05 ENCOUNTER — Encounter: Payer: Self-pay | Admitting: *Deleted

## 2016-07-05 ENCOUNTER — Non-Acute Institutional Stay: Payer: Medicare Other | Admitting: Nurse Practitioner

## 2016-07-05 DIAGNOSIS — F323 Major depressive disorder, single episode, severe with psychotic features: Secondary | ICD-10-CM

## 2016-07-05 DIAGNOSIS — R197 Diarrhea, unspecified: Secondary | ICD-10-CM

## 2016-07-05 DIAGNOSIS — N183 Chronic kidney disease, stage 3 (moderate): Secondary | ICD-10-CM

## 2016-07-05 DIAGNOSIS — G309 Alzheimer's disease, unspecified: Secondary | ICD-10-CM

## 2016-07-05 DIAGNOSIS — N390 Urinary tract infection, site not specified: Secondary | ICD-10-CM

## 2016-07-05 DIAGNOSIS — I10 Essential (primary) hypertension: Secondary | ICD-10-CM | POA: Diagnosis not present

## 2016-07-05 DIAGNOSIS — F0280 Dementia in other diseases classified elsewhere without behavioral disturbance: Secondary | ICD-10-CM

## 2016-07-05 DIAGNOSIS — N179 Acute kidney failure, unspecified: Secondary | ICD-10-CM | POA: Diagnosis not present

## 2016-07-05 DIAGNOSIS — N189 Chronic kidney disease, unspecified: Secondary | ICD-10-CM

## 2016-07-05 DIAGNOSIS — D631 Anemia in chronic kidney disease: Secondary | ICD-10-CM | POA: Diagnosis not present

## 2016-07-05 DIAGNOSIS — K219 Gastro-esophageal reflux disease without esophagitis: Secondary | ICD-10-CM

## 2016-07-05 NOTE — Assessment & Plan Note (Signed)
Mood instability, the patient appears anxious, irritated, angry, and somehow suspicious since her Celexa 10mg  qd was discontinued. She states "I am going to kill myself" often when she is angry or frustrated, but she has no suicidal plans. She resides in AL and under around clock check. She stated she wound eat until her deceased husband came. Celexa 10mg  qd resumed.  07/05/16 adding Seroquel 25mg  qd if POA consents

## 2016-07-05 NOTE — Progress Notes (Signed)
Location:  [facility] Room Number: 245 Place of Service:  ALF [phone]) Provider:  Mast, Manxie   NP  Estill Dooms, MD  Patient Care Team: Estill Dooms, MD as PCP - General (Internal Medicine) Stark Klein, MD as Consulting Physician (General Surgery) Magrinat, Virgie Dad, MD as Consulting Physician (Hematology and Oncology) Kyung Rudd, MD as Consulting Physician (Radiation Oncology) Luberta Mutter, MD as Consulting Physician (Ophthalmology) Mast, Man X, NP as Nurse Practitioner (Internal Medicine)  Extended Emergency Contact Information Primary Emergency Contact: [name],[name] Address: [address]          [place], [place] [address] [place] Phone: [phone] Work Phone: [phone] Relation: Daughter  Code Status:  Full Code Goals of care: Advanced Directive information Advanced Directives 07/05/2016  Does Patient Have a Medical Advance Directive? Yes  Type of Paramedic of [place];Living will  Does patient want to make changes to medical advance directive? No - Patient declined  Copy of [place] in Chart? Yes  Would patient like information on creating a medical advance directive? -  Pre-existing out of facility DNR order (yellow form or pink MOST form) -     Chief Complaint  Patient presents with  . Acute Visit    UTI/ follow-up on depression    HPI:  Pt is a 83 y.o. female seen today for an acute visit for    Past Medical History:  Diagnosis Date  . Abnormal weight gain   . Abnormality of gait   . Acute upper respiratory infections of unspecified site   . Anemia, unspecified   . Appendicitis   . Arthritis   . Chronic kidney disease, stage III (moderate)   . Dementia in conditions classified elsewhere without behavioral disturbance   . Depression   . Diverticulitis   . Diverticulosis of colon (without mention of hemorrhage)   . Dizziness and giddiness   . Edema  of leg   . GERD (gastroesophageal reflux disease)   . Glaucoma   . Hyperlipidemia LDL goal < 100   . Hypertension   . Irritable bowel syndrome   . Irritable bowel syndrome   . Leukocytopenia, unspecified   . Malignant neoplasm of breast (female), unspecified site   . Memory loss   . Other B-complex deficiencies   . Other malaise and fatigue   . Other specified symptom associated with female genital organs   . Personal history of fall   . Polymyalgia rheumatica ([place])   . Regional enteritis of unspecified site   . Sciatica   . Sciatica   . Sebaceous cyst   . Unspecified arthropathy, shoulder region   . Unspecified glaucoma(365.9)   . Unspecified vitamin D deficiency   . Urge incontinence   . Vertigo    Past Surgical History:  Procedure Laterality Date  . BREAST LUMPECTOMY  10/28/10   left breast   . BREAST SURGERY     R breast lumpectomy for benign disease  . CATARACT EXTRACTION  1990  . CHOLECYSTECTOMY  1972  . NASAL SINUS SURGERY  1966    Allergies  Allergen Reactions  . Codeine     States she blacks out. Listed on [facility]    Outpatient Encounter Prescriptions as of 07/05/2016  Medication Sig  . acetaminophen (TYLENOL) 500 MG chewable tablet Chew 500 mg by mouth every 6 (six) hours as needed for pain. Take 2 tablets three times a day  . amLODipine (NORVASC) 5 MG  tablet Take 5 mg by mouth daily.  Marland Kitchen amoxicillin-clavulanate (AUGMENTIN) 500-125 MG tablet Take 1 tablet by mouth 2 (two) times daily.  . bimatoprost (LUMIGAN) 0.01 % SOLN Place 1 drop into both eyes. One drop into both eyes at bedtime  . brimonidine-timolol (COMBIGAN) 0.2-0.5 % ophthalmic solution Place 1 drop into both eyes 2 (two) times daily.   . calcium carbonate (TUMS - DOSED IN MG ELEMENTAL CALCIUM) 500 MG chewable tablet Chew 1 tablet by mouth daily.  . chlorhexidine (PERIDEX) 0.12 % solution Use as directed 15 mLs in the mouth or throat. One teaspoon to brush teeth at bedtime  . citalopram (CELEXA) 10 MG  tablet Take 1 tablet (10 mg total) by mouth daily.  . diclofenac sodium (VOLTAREN) 1 % GEL Apply 2 g topically 3 (three) times daily.  . diphenoxylate-atropine (LOMOTIL) 2.5-0.025 MG tablet 2 tablets twice daily to prevent diarrhea  . dorzolamide (TRUSOPT) 2 % ophthalmic solution Place 1 drop into both eyes 2 (two) times daily.  Marland Kitchen guaiFENesin-codeine (ROBAFEN AC) 100-10 MG/5ML syrup Take by mouth. 4 ml four times daily as needed for cough and con  . hyoscyamine (LEVSIN, ANASPAZ) 0.125 MG tablet 1 tablet before each meal to help relieve gas and abdominal discomfort  . lidocaine (LIDODERM) 5 % APPLY 1 PATCH TO EACH SHOULDER EVERY MORNING, KEEP ON FOR 12 HOURS AND THEN REMOVE FROM EACH SHOULDER AT BEDTIME  . Loratadine (CLARITIN) 10 MG CAPS Take 10 mg by mouth daily.  . Multiple Vitamin (MULTIVITAMIN WITH MINERALS) TABS Take 1 tablet by mouth daily with breakfast.   . ranitidine (ZANTAC) 150 MG tablet Take 150 mg by mouth daily as needed for heartburn.   . saccharomyces boulardii (FLORASTOR) 250 MG capsule Take 250 mg by mouth 2 (two) times daily.   No facility-administered encounter medications on file as of 07/05/2016.     Review of Systems  Immunization History  Administered Date(s) Administered  . Influenza,inj,Quad PF,36+ Mos 11/07/2012, 10/03/2013  . Influenza-Unspecified 12/30/2009, 11/17/2010, 11/30/2011  . Pneumococcal Conjugate-13 01/05/2014  . Pneumococcal Polysaccharide-23 01/30/2006   Pertinent  Health Maintenance Due  Topic Date Due  . INFLUENZA VACCINE  08/30/2016  . DEXA SCAN  Completed  . PNA vac Low Risk Adult  Completed   Fall Risk  05/29/2016 01/05/2014 10/03/2013 09/05/2012 09/05/2012  Falls in the past year? Yes Yes Yes Yes Yes  Number falls in past yr: 2 or more 2 or more 2 or more - 2 or more  Injury with Fall? Yes - - - Yes  Risk Factor Category  High Fall Risk - High Fall Risk - High Fall Risk  Risk for fall due to : - - History of fall(s);Impaired  balance/gait;Impaired mobility;Impaired vision;Mental status change - Impaired balance/gait;Impaired mobility   Functional Status Survey:    Vitals:   07/05/16 1051  BP: 128/80  Pulse: 76  Resp: 20  Temp: 97.9 F (36.6 C)  Weight: 195 lb 9.6 oz (88.7 kg)  Height: 5\' 5"  (1.651 m)   Body mass index is 32.55 kg/m. Physical Exam  Labs reviewed:  Recent Labs  09/12/15 1335 05/09/16 06/30/16  NA 142 142 141  K 4.4 4.9 4.6  CL 109  --   --   CO2 27  --   --   GLUCOSE 87  --   --   BUN 17 30* 22*  CREATININE 1.22* 1.6* 1.2*  CALCIUM 9.6  --   --     Recent Labs  05/09/16 06/30/16  AST 12* 15  ALT 9 9  ALKPHOS 66 74    Recent Labs  09/12/15 1335 05/09/16 06/06/16 06/30/16  WBC 3.8* 4.1 4.0 4.4  HGB 10.9* 10.0* 10.2* 10.0*  HCT 36.5 31* 32* 31*  MCV 100.6*  --   --   --   PLT 166 149* 152 181   Lab Results  Component Value Date   TSH 2.03 05/09/2016   Lab Results  Component Value Date   HGBA1C 5.4 06/04/2014   Lab Results  Component Value Date   CHOL 193 06/04/2014   HDL 49 06/04/2014   LDLCALC 124 06/04/2014   TRIG 102 06/04/2014   CHOLHDL 2.7 06/27/2012    Significant Diagnostic Results in last 30 days:  No results found.  Assessment/Plan There are no diagnoses linked to this encounter.   Family/ staff Communication:    Labs/tests ordered:

## 2016-07-05 NOTE — Assessment & Plan Note (Signed)
Not on memory preserving meds, obtain MMSE

## 2016-07-05 NOTE — Assessment & Plan Note (Signed)
Controlled, continue Amlodipine 5mg  qd

## 2016-07-05 NOTE — Assessment & Plan Note (Signed)
Stable, continue Zantac.

## 2016-07-05 NOTE — Assessment & Plan Note (Addendum)
Managed with Levsin 0.125mg , prn Lomotil, 06/30/16 wbc 4.4, Hgb 10.0, plt 181, Na 141, K 4.6, Bun 22, creat 1.21

## 2016-07-05 NOTE — Assessment & Plan Note (Signed)
06/30/16 Hgb 10.0

## 2016-07-05 NOTE — Assessment & Plan Note (Signed)
06/30/16 Bun 22, creat 1.21

## 2016-07-05 NOTE — Assessment & Plan Note (Signed)
06/30/16 wbc 4.4, Hgb 10.0, plt 181, Na 141, K 4.6, Bun 22, creat 1.21 07/02/16 urine culture E. Coli >100,000c/ml, Augmentin 875mg  bid x 7days

## 2016-07-06 ENCOUNTER — Encounter (HOSPITAL_COMMUNITY): Payer: Self-pay | Admitting: Emergency Medicine

## 2016-07-06 ENCOUNTER — Emergency Department (HOSPITAL_COMMUNITY): Payer: Medicare Other

## 2016-07-06 ENCOUNTER — Emergency Department (HOSPITAL_COMMUNITY)
Admission: EM | Admit: 2016-07-06 | Discharge: 2016-07-07 | Disposition: A | Discharge status: Skilled Nursing Facility | Payer: Medicare Other | Attending: Emergency Medicine | Admitting: Emergency Medicine

## 2016-07-06 DIAGNOSIS — R0689 Other abnormalities of breathing: Secondary | ICD-10-CM | POA: Diagnosis not present

## 2016-07-06 DIAGNOSIS — R4182 Altered mental status, unspecified: Secondary | ICD-10-CM | POA: Diagnosis not present

## 2016-07-06 DIAGNOSIS — F23 Brief psychotic disorder: Secondary | ICD-10-CM | POA: Diagnosis not present

## 2016-07-06 DIAGNOSIS — I129 Hypertensive chronic kidney disease with stage 1 through stage 4 chronic kidney disease, or unspecified chronic kidney disease: Secondary | ICD-10-CM | POA: Insufficient documentation

## 2016-07-06 DIAGNOSIS — G309 Alzheimer's disease, unspecified: Secondary | ICD-10-CM | POA: Diagnosis not present

## 2016-07-06 DIAGNOSIS — N183 Chronic kidney disease, stage 3 (moderate): Secondary | ICD-10-CM | POA: Insufficient documentation

## 2016-07-06 DIAGNOSIS — Z79899 Other long term (current) drug therapy: Secondary | ICD-10-CM | POA: Diagnosis not present

## 2016-07-06 DIAGNOSIS — R5383 Other fatigue: Secondary | ICD-10-CM | POA: Insufficient documentation

## 2016-07-06 DIAGNOSIS — F332 Major depressive disorder, recurrent severe without psychotic features: Secondary | ICD-10-CM | POA: Diagnosis not present

## 2016-07-06 DIAGNOSIS — Z853 Personal history of malignant neoplasm of breast: Secondary | ICD-10-CM | POA: Diagnosis not present

## 2016-07-06 DIAGNOSIS — R451 Restlessness and agitation: Secondary | ICD-10-CM | POA: Diagnosis not present

## 2016-07-06 DIAGNOSIS — F22 Delusional disorders: Secondary | ICD-10-CM | POA: Diagnosis not present

## 2016-07-06 LAB — URINALYSIS, ROUTINE W REFLEX MICROSCOPIC
Bilirubin Urine: NEGATIVE
Glucose, UA: NEGATIVE mg/dL
Hgb urine dipstick: NEGATIVE
Ketones, ur: NEGATIVE mg/dL
LEUKOCYTES UA: NEGATIVE
Nitrite: NEGATIVE
PROTEIN: NEGATIVE mg/dL
Specific Gravity, Urine: 1.014 (ref 1.005–1.030)
pH: 6 (ref 5.0–8.0)

## 2016-07-06 LAB — CBG MONITORING, ED: Glucose-Capillary: 107 mg/dL — ABNORMAL HIGH (ref 65–99)

## 2016-07-06 LAB — CBC WITH DIFFERENTIAL/PLATELET
BASOS PCT: 0 %
Basophils Absolute: 0 10*3/uL (ref 0.0–0.1)
Eosinophils Absolute: 0.1 10*3/uL (ref 0.0–0.7)
Eosinophils Relative: 2 %
HEMATOCRIT: 33.7 % — AB (ref 36.0–46.0)
Hemoglobin: 10.5 g/dL — ABNORMAL LOW (ref 12.0–15.0)
Lymphocytes Relative: 23 %
Lymphs Abs: 0.9 10*3/uL (ref 0.7–4.0)
MCH: 30.9 pg (ref 26.0–34.0)
MCHC: 31.2 g/dL (ref 30.0–36.0)
MCV: 99.1 fL (ref 78.0–100.0)
MONO ABS: 0.4 10*3/uL (ref 0.1–1.0)
MONOS PCT: 11 %
Neutro Abs: 2.6 10*3/uL (ref 1.7–7.7)
Neutrophils Relative %: 64 %
Platelets: 201 10*3/uL (ref 150–400)
RBC: 3.4 MIL/uL — ABNORMAL LOW (ref 3.87–5.11)
RDW: 13.3 % (ref 11.5–15.5)
WBC: 4.1 10*3/uL (ref 4.0–10.5)

## 2016-07-06 LAB — I-STAT TROPONIN, ED: Troponin i, poc: 0.01 ng/mL (ref 0.00–0.08)

## 2016-07-06 LAB — COMPREHENSIVE METABOLIC PANEL
ALBUMIN: 3.9 g/dL (ref 3.5–5.0)
ALT: 15 U/L (ref 14–54)
ANION GAP: 6 (ref 5–15)
AST: 18 U/L (ref 15–41)
Alkaline Phosphatase: 75 U/L (ref 38–126)
BILIRUBIN TOTAL: 0.7 mg/dL (ref 0.3–1.2)
BUN: 26 mg/dL — ABNORMAL HIGH (ref 6–20)
CO2: 27 mmol/L (ref 22–32)
Calcium: 9.3 mg/dL (ref 8.9–10.3)
Chloride: 108 mmol/L (ref 101–111)
Creatinine, Ser: 1.18 mg/dL — ABNORMAL HIGH (ref 0.44–1.00)
GFR, EST AFRICAN AMERICAN: 44 mL/min — AB (ref >60)
GFR, EST NON AFRICAN AMERICAN: 38 mL/min — AB (ref >60)
Glucose, Bld: 114 mg/dL — ABNORMAL HIGH (ref 65–99)
POTASSIUM: 4.9 mmol/L (ref 3.5–5.1)
Sodium: 141 mmol/L (ref 135–145)
TOTAL PROTEIN: 7.4 g/dL (ref 6.5–8.1)

## 2016-07-06 LAB — BRAIN NATRIURETIC PEPTIDE: B NATRIURETIC PEPTIDE 5: 140.3 pg/mL — AB (ref 0.0–100.0)

## 2016-07-06 NOTE — ED Notes (Signed)
EKG delayed pt not currently in room.

## 2016-07-06 NOTE — ED Triage Notes (Signed)
Pt from [facility] to be evaluated for altered mental status. Per EMS pt was seen for UTI yesterday and started on antibiotics. Today pt was sleeping and was hard to wake up. Facility was concerned because when she woke up she was speaking french. Per pt's daughter, she speaks french when she is angry. Pt denies pain and is refusing to have any vital signs taken at this time. Pt refused care from EMS as well.

## 2016-07-06 NOTE — ED Provider Notes (Signed)
[facility] DEPT Provider Note   CSN: [id number] Arrival date & time: 07/06/16  1805     History   Chief Complaint Chief Complaint  Patient presents with  . Altered Mental Status    HPI Michelle [name] is a 83 y.o. female.  HPI   Michelle [name] is a 83 y.o. female, with a history of Anemia, dementia, depression, CKD, and HTN, presenting to the ED with reported altered mental status. Per EMS, she was apparently unresponsive for a time at her AL facility. She was uncooperative with EMS and upon arriving in the ED.   Upon my evaluation, patient denies complaints. She initially refuses to answer questions and workup was started under the suspicion of altered mental status.  However, once patient's family arrived, she became much more cooperative. She denies any complaints and states she was initially uncooperative because she was angry people were bothering her.     Patient comes from [facility] [place]. Eulis Canner daughter in law [phone].   Past Medical History:  Diagnosis Date  . Abnormal weight gain   . Abnormality of gait   . Acute upper respiratory infections of unspecified site   . Anemia, unspecified   . Appendicitis   . Arthritis   . Chronic kidney disease, stage III (moderate)   . Dementia in conditions classified elsewhere without behavioral disturbance   . Depression   . Diverticulitis   . Diverticulosis of colon (without mention of hemorrhage)   . Dizziness and giddiness   . Edema of leg   . GERD (gastroesophageal reflux disease)   . Glaucoma   . Hyperlipidemia LDL goal < 100   . Hypertension   . Irritable bowel syndrome   . Irritable bowel syndrome   . Leukocytopenia, unspecified   . Malignant neoplasm of breast (female), unspecified site   . Memory loss   . Other B-complex deficiencies   . Other malaise and fatigue   . Other specified symptom associated with female genital organs   . Personal history of fall   . Polymyalgia  rheumatica ([place])   . Regional enteritis of unspecified site   . Sciatica   . Sciatica   . Sebaceous cyst   . Unspecified arthropathy, shoulder region   . Unspecified glaucoma(365.9)   . Unspecified vitamin D deficiency   . Urge incontinence   . Vertigo     Patient Active Problem List   Diagnosis Date Noted  . Altered mental status, unspecified 07/07/2016  . Paresthesia 05/29/2016  . Anemia associated with chronic renal failure 05/10/2016  . Depression, psychotic ([place]) 05/04/2016  . GERD (gastroesophageal reflux disease) 05/04/2016  . Diarrhea 05/04/2016  . Acute renal failure superimposed on stage 3 chronic kidney disease ([place]) 03/31/2014  . Traumatic subdural hematoma (HCC)   . Rib pain on right side 10/03/2013  . Glaucoma 10/03/2013  . Essential hypertension, benign 10/03/2013  . Myalgia and myositis 10/03/2013  . 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 Alzheimer's dementia 06/30/2012  . Urge incontinence   . Hyperlipidemia LDL goal < 100   . Fall 02/13/2011  . UTI (urinary tract infection) 02/13/2011  . Difficulty walking 02/12/2011  . Polymyalgia rheumatica ([place]) 02/12/2011    Past Surgical History:  Procedure Laterality Date  . BREAST LUMPECTOMY  10/28/10   left breast   . BREAST SURGERY     R breast lumpectomy for benign disease  . CATARACT EXTRACTION  [place]  . NASAL SINUS SURGERY  1966    OB History    No data available       Home Medications    Prior to Admission medications   Medication Sig Start Date End Date Taking? Authorizing Provider  acetaminophen (TYLENOL) 500 MG chewable tablet Chew 1,000 mg by mouth 3 (three) times daily. Take 2 tablets three times a day    Yes [provider]  amLODipine (NORVASC) 5 MG tablet Take 5 mg by mouth daily.   Yes [provider]  amoxicillin-clavulanate (AUGMENTIN) 500-125 MG tablet Take 1 tablet by mouth  2 (two) times daily. 07/02/16 07/09/16 Yes [provider]  bimatoprost (LUMIGAN) 0.01 % SOLN Place 1 drop into both eyes. One drop into both eyes at bedtime   Yes [provider]  brimonidine-timolol (COMBIGAN) 0.2-0.5 % ophthalmic solution Place 1 drop into both eyes 2 (two) times daily.    Yes [provider]  calcium carbonate (TUMS - DOSED IN MG ELEMENTAL CALCIUM) 500 MG chewable tablet Chew 1 tablet by mouth daily.   Yes [provider]  chlorhexidine (PERIDEX) 0.12 % solution Use as directed 15 mLs in the mouth or throat at bedtime. One teaspoon to brush teeth at bedtime    Yes [provider]  citalopram (CELEXA) 10 MG tablet Take 1 tablet (10 mg total) by mouth daily. 03/29/14  Yes Rama, Venetia Maxon, MD  diclofenac sodium (VOLTAREN) 1 % GEL Apply 2 g topically 3 (three) times daily.   Yes [provider]  diphenoxylate-atropine (LOMOTIL) 2.5-0.025 MG tablet 2 tablets twice daily to prevent diarrhea 05/29/16  Yes Estill Dooms, MD  dorzolamide (TRUSOPT) 2 % ophthalmic solution Place 1 drop into both eyes 2 (two) times daily. 02/13/14  Yes Estill Dooms, MD  hyoscyamine (LEVSIN, ANASPAZ) 0.125 MG tablet 1 tablet before each meal to help relieve gas and abdominal discomfort Patient taking differently: Take 0.125 mg by mouth 3 (three) times daily.  05/29/16  Yes Estill Dooms, MD  lidocaine (LIDODERM) 5 % APPLY 1 PATCH TO EACH SHOULDER EVERY MORNING, KEEP ON FOR 12 HOURS AND THEN REMOVE FROM [facility] SHOULDER AT BEDTIME 10/20/15  Yes Reed, Tiffany L, DO  Multiple Vitamin (MULTIVITAMIN WITH MINERALS) TABS Take 1 tablet by mouth daily with breakfast.    Yes [provider]  saccharomyces boulardii (FLORASTOR) 250 MG capsule Take 250 mg by mouth 2 (two) times daily.   Yes [provider]  guaiFENesin-codeine (ROBAFEN AC) 100-10 MG/5ML syrup Take by mouth. 4 ml four times daily as needed for cough and con    [provider]    Loratadine (CLARITIN) 10 MG CAPS Take 10 mg by mouth daily as needed (allergies).     [provider]  ranitidine (ZANTAC) 150 MG tablet Take 150 mg by mouth daily as needed for heartburn.     [provider]  risperiDONE (RISPERDAL) 0.25 MG tablet Take 0.25 mg by mouth daily as needed. For increased agitation  X 1week.    [provider]    Family History Family History  Problem Relation Age of Onset  . Stroke Father   . Hypertension Mother   . Stroke Mother   . Breast cancer Mother   . Heart attack Brother   . Cancer Brother   . Stroke Brother   . Heart attack Son   . Colon cancer Sister     Social History Social History  Substance Use Topics  . Smoking status: Never Smoker  . Smokeless tobacco: Never Used  . Alcohol use No     Allergies   Codeine   Review of Systems Review of Systems  Constitutional: Negative for chills and fever.  Respiratory: Negative for shortness of breath.   Cardiovascular: Negative for chest pain.  Gastrointestinal: Negative for abdominal pain, nausea and vomiting.  Neurological: Negative for dizziness, syncope, weakness, light-headedness, numbness and headaches.  All other systems reviewed and are negative.    Physical Exam Updated Vital Signs BP (!) 164/70 (BP Location: Right Arm)   Pulse (!) 51   Temp 98 F (36.7 C) (Oral)   Resp 20   SpO2 97%   Physical Exam  Constitutional: She appears well-developed and well-nourished. No distress.  HENT:  Head: Normocephalic and atraumatic.  Mouth/Throat: Oropharynx is clear and moist.  Eyes: Conjunctivae and EOM are normal. Pupils are equal, round, and reactive to light.  Neck: Normal range of motion. Neck supple.  Cardiovascular: Normal rate, regular rhythm, normal heart sounds and intact distal pulses.   Pulmonary/Chest: Effort normal and breath sounds normal. No respiratory distress.  Abdominal: Soft. There is no tenderness. There is no guarding.   Musculoskeletal: She exhibits no edema.  Normal motor function intact in all extremities and spine. No midline spinal tenderness.   Lymphadenopathy:    She has no cervical adenopathy.  Neurological:  Initially somnolent. Patient's family states patient has some confusion at baseline, however, they state she is acting and mentating normally.  No sensory deficits. Strength 5/5 in all extremities. No upright ataxia. Coordination intact. Cranial nerves III-XII grossly intact. No facial droop.   Skin: Skin is warm and dry. She is not diaphoretic.  Psychiatric: She has a normal mood and affect. Her behavior is normal.  Nursing note and vitals reviewed.    ED Treatments / Results  Labs (all labs ordered are listed, but only abnormal results are displayed) Labs Reviewed  CBC WITH DIFFERENTIAL/PLATELET - Abnormal; Notable for the following:       Result Value   RBC 3.40 (*)    Hemoglobin 10.5 (*)    HCT 33.7 (*)    All other components within normal limits  COMPREHENSIVE METABOLIC PANEL - Abnormal; Notable for the following:    Glucose, Bld 114 (*)    BUN 26 (*)    Creatinine, Ser 1.18 (*)    GFR calc non Af Amer 38 (*)    GFR calc Af Amer 44 (*)    All other components within normal limits  BRAIN NATRIURETIC PEPTIDE - Abnormal; Notable for the following:    B Natriuretic Peptide 140.3 (*)    All other components within normal limits  CBG MONITORING, ED - Abnormal; Notable for the following:    Glucose-Capillary 107 (*)    All other components within normal limits  URINALYSIS, ROUTINE W REFLEX MICROSCOPIC  I-STAT TROPOININ, ED    EKG  EKG Interpretation  Date/Time:  Thursday July 06 2016 19:43:35 EDT Ventricular Rate:  53 PR Interval:    QRS Duration: 93 QT Interval:  451 QTC Calculation: 424 R Axis:   -4 Text Interpretation:  Sinus rhythm Abnormal R-wave progression, early transition Left ventricular hypertrophy Baseline wander in lead(s) V2 No significant change since  last tracing Confirmed by Gareth Morgan [phone]) on 07/06/2016 10:48:45 PM Also confirmed by Gareth Morgan [phone]), editor Verna Czech [phone])  on 07/07/2016 7:55:39 AM       Radiology Dg Chest 2  View  Result Date: 07/06/2016 CLINICAL DATA:  Altered mental status EXAM: CHEST  2 VIEW COMPARISON:  09/12/2015 FINDINGS: The lungs are clear wiithout focal pneumonia, edema, pneumothorax or pleural effusion. Cardiopericardial silhouette is at upper limits of normal for size. The visualized bony structures of the thorax are intact. Telemetry leads overlie the chest. IMPRESSION: Stable.  No acute findings. Electronically Signed   By: Misty Stanley M.D.   On: 07/06/2016 19:34    Procedures Procedures (including critical care time)  Medications Ordered in ED Medications - No data to display   Initial Impression / Assessment and Plan / ED Course  I have reviewed the triage vital signs and the nursing notes.  Pertinent labs & imaging results that were available during my care of the patient were reviewed by me and considered in my medical decision making (see chart for details).  Clinical Course as of Jul 07 2304  Thu Jul 06, 2016  1938 Spoke with Norlene Duel, nurse at [facility] [phone]).  States pt was in the dining room around 1700 sitting up in her wheelchair and was found to be unresponsive by one of the CNAs. Given O2. Woke up combative after about 5 minutes. Has been intermittently combative for last couple days.  Was started on Seroquel yesterday for her agitation and combativeness. Started Amox for UTI on 6/3. No record of recent falls or trauma.   [SJ]    Clinical Course User Index [SJ] Jerita Wimbush C, PA-C    Patient presents with reported altered mental status. Initially somnolent, but easily arousable and without complaints.   Spoke with patient's family. They indicate the patient was placed on celexa last week; seroquel, and risperdal  yesterday. They state that she appears to be acting normally, she just seems sleepy. Patient told her family members that she knew the nurse at the facility was trying to wake her up, but she wanted to sleep so she ignored her. She states she remembers the entire event. Lab results encouraging. Suspect some polypharmacy may be contributing to the patient's initial presentation. Encouraged to follow-up with the patient's PCP. Patient and family are comfortable with discharge. Return precautions discussed.    Vitals:   07/06/16 1932 07/07/16 0007 07/07/16 0126 07/07/16 0310  BP: (!) 164/70 (!) 159/90  (!) 160/89  Pulse: (!) 51 (!) 56  60  Resp: 20 13  15   Temp: 98 F (36.7 C)   98 F (36.7 C)  TempSrc: Oral   Oral  SpO2: 97% 98% 96% 96%     Final Clinical Impressions(s) / ED Diagnoses   Final diagnoses:  Other fatigue    New Prescriptions Discharge Medication List as of 07/07/2016 12:36 AM       Lorayne Bender, PA-C 07/07/16 7903    Gareth Morgan, MD 07/08/16 1415

## 2016-07-06 NOTE — ED Notes (Signed)
Bed: WA02 Expected date:  Expected time:  Means of arrival:  Comments: 83 yo f awoke speaking french

## 2016-07-07 ENCOUNTER — Non-Acute Institutional Stay: Payer: Medicare Other | Admitting: Nurse Practitioner

## 2016-07-07 ENCOUNTER — Encounter: Payer: Self-pay | Admitting: Nurse Practitioner

## 2016-07-07 DIAGNOSIS — K219 Gastro-esophageal reflux disease without esophagitis: Secondary | ICD-10-CM

## 2016-07-07 DIAGNOSIS — F323 Major depressive disorder, single episode, severe with psychotic features: Secondary | ICD-10-CM | POA: Diagnosis not present

## 2016-07-07 DIAGNOSIS — I10 Essential (primary) hypertension: Secondary | ICD-10-CM

## 2016-07-07 DIAGNOSIS — R4182 Altered mental status, unspecified: Secondary | ICD-10-CM | POA: Diagnosis not present

## 2016-07-07 DIAGNOSIS — F0390 Unspecified dementia without behavioral disturbance: Secondary | ICD-10-CM | POA: Diagnosis not present

## 2016-07-07 NOTE — ED Notes (Signed)
Called Ptar: [facility] going [facility] to [facility] [address]. Called facility: facility ready for pt return

## 2016-07-07 NOTE — Assessment & Plan Note (Signed)
Controlled, continue Amlodipine 5mg  qd

## 2016-07-07 NOTE — Assessment & Plan Note (Addendum)
She is at her baseline mentation, in her usual state of health upon my examination today, Seroquel may contributory, dc Seroquel, observe the patient.

## 2016-07-07 NOTE — Progress Notes (Signed)
Location:  [facility] Room Number: 824 Place of Service:  ALF [phone]) Provider:  Mast, Manxie  NP  Estill Dooms, MD  Patient Care Team: Estill Dooms, MD as PCP - General (Internal Medicine) Stark Klein, MD as Consulting Physician (General Surgery) Magrinat, Virgie Dad, MD as Consulting Physician (Hematology and Oncology) Kyung Rudd, MD as Consulting Physician (Radiation Oncology) Luberta Mutter, MD as Consulting Physician (Ophthalmology) Mast, Man X, NP as Nurse Practitioner (Internal Medicine)  Extended Emergency Contact Information Primary Emergency Contact: [name],[name] Address: [address]          [place], [place] [address] [place] Phone: [phone] Work Phone: [phone] Relation: Daughter  Code Status:  Full Code Goals of care: Advanced Directive information Advanced Directives 07/07/2016  Does Patient Have a Medical Advance Directive? Yes  Type of Paramedic of [place];Living will  Does patient want to make changes to medical advance directive? No - Patient declined  Copy of [place] in Chart? Yes  Would patient like information on creating a medical advance directive? -  Pre-existing out of facility DNR order (yellow form or pink MOST form) -     Chief Complaint  Patient presents with  . Acute Visit    hosp. f/u-> sent for non responsiveness    HPI:  Pt is a 83 y.o. female seen today for an acute visit for    Past Medical History:  Diagnosis Date  . Abnormal weight gain   . Abnormality of gait   . Acute upper respiratory infections of unspecified site   . Anemia, unspecified   . Appendicitis   . Arthritis   . Chronic kidney disease, stage III (moderate)   . Dementia in conditions classified elsewhere without behavioral disturbance   . Depression   . Diverticulitis   . Diverticulosis of colon (without mention of hemorrhage)   . Dizziness and giddiness    . Edema of leg   . GERD (gastroesophageal reflux disease)   . Glaucoma   . Hyperlipidemia LDL goal < 100   . Hypertension   . Irritable bowel syndrome   . Irritable bowel syndrome   . Leukocytopenia, unspecified   . Malignant neoplasm of breast (female), unspecified site   . Memory loss   . Other B-complex deficiencies   . Other malaise and fatigue   . Other specified symptom associated with female genital organs   . Personal history of fall   . Polymyalgia rheumatica ([place])   . Regional enteritis of unspecified site   . Sciatica   . Sciatica   . Sebaceous cyst   . Unspecified arthropathy, shoulder region   . Unspecified glaucoma(365.9)   . Unspecified vitamin D deficiency   . Urge incontinence   . Vertigo    Past Surgical History:  Procedure Laterality Date  . BREAST LUMPECTOMY  10/28/10   left breast   . BREAST SURGERY     R breast lumpectomy for benign disease  . CATARACT EXTRACTION  1990  . CHOLECYSTECTOMY  1972  . NASAL SINUS SURGERY  1966    Allergies  Allergen Reactions  . Codeine     States she blacks out. Listed on [facility]    Outpatient Encounter Prescriptions as of 07/07/2016  Medication Sig  . acetaminophen (TYLENOL) 500 MG chewable tablet Chew 1,000 mg by mouth 3 (three) times daily. Take 2 tablets three times a day   . amLODipine (NORVASC) 5 MG tablet Take  5 mg by mouth daily.  Marland Kitchen amoxicillin-clavulanate (AUGMENTIN) 500-125 MG tablet Take 1 tablet by mouth 2 (two) times daily.  . bimatoprost (LUMIGAN) 0.01 % SOLN Place 1 drop into both eyes. One drop into both eyes at bedtime  . brimonidine-timolol (COMBIGAN) 0.2-0.5 % ophthalmic solution Place 1 drop into both eyes 2 (two) times daily.   . calcium carbonate (TUMS - DOSED IN MG ELEMENTAL CALCIUM) 500 MG chewable tablet Chew 1 tablet by mouth daily.  . chlorhexidine (PERIDEX) 0.12 % solution Use as directed 15 mLs in the mouth or throat at bedtime. One teaspoon to brush teeth at bedtime   . citalopram (CELEXA)  10 MG tablet Take 1 tablet (10 mg total) by mouth daily.  . diclofenac sodium (VOLTAREN) 1 % GEL Apply 2 g topically 3 (three) times daily.  . diphenoxylate-atropine (LOMOTIL) 2.5-0.025 MG tablet 2 tablets twice daily to prevent diarrhea  . dorzolamide (TRUSOPT) 2 % ophthalmic solution Place 1 drop into both eyes 2 (two) times daily.  Marland Kitchen guaiFENesin-codeine (ROBAFEN AC) 100-10 MG/5ML syrup Take by mouth. 4 ml four times daily as needed for cough and con  . hyoscyamine (LEVSIN, ANASPAZ) 0.125 MG tablet 1 tablet before each meal to help relieve gas and abdominal discomfort (Patient taking differently: Take 0.125 mg by mouth 3 (three) times daily. )  . lidocaine (LIDODERM) 5 % APPLY 1 PATCH TO EACH SHOULDER EVERY MORNING, KEEP ON FOR 12 HOURS AND THEN REMOVE FROM EACH SHOULDER AT BEDTIME  . Loratadine (CLARITIN) 10 MG CAPS Take 10 mg by mouth daily as needed (allergies).   . Multiple Vitamin (MULTIVITAMIN WITH MINERALS) TABS Take 1 tablet by mouth daily with breakfast.   . ranitidine (ZANTAC) 150 MG tablet Take 150 mg by mouth daily as needed for heartburn.   . risperiDONE (RISPERDAL) 0.25 MG tablet Take 0.25 mg by mouth daily as needed. For increased agitation  X 1week.  . saccharomyces boulardii (FLORASTOR) 250 MG capsule Take 250 mg by mouth 2 (two) times daily.  . [DISCONTINUED] QUEtiapine (SEROQUEL) 25 MG tablet Take 25 mg by mouth daily.   No facility-administered encounter medications on file as of 07/07/2016.     Review of Systems  Immunization History  Administered Date(s) Administered  . Influenza,inj,Quad PF,36+ Mos 11/07/2012, 10/03/2013  . Influenza-Unspecified 12/30/2009, 11/17/2010, 11/30/2011  . Pneumococcal Conjugate-13 01/05/2014  . Pneumococcal Polysaccharide-23 01/30/2006   Pertinent  Health Maintenance Due  Topic Date Due  . INFLUENZA VACCINE  08/30/2016  . DEXA SCAN  Completed  . PNA vac Low Risk Adult  Completed   Fall Risk  05/29/2016 01/05/2014 10/03/2013 09/05/2012  09/05/2012  Falls in the past year? Yes Yes Yes Yes Yes  Number falls in past yr: 2 or more 2 or more 2 or more - 2 or more  Injury with Fall? Yes - - - Yes  Risk Factor Category  High Fall Risk - High Fall Risk - High Fall Risk  Risk for fall due to : - - History of fall(s);Impaired balance/gait;Impaired mobility;Impaired vision;Mental status change - Impaired balance/gait;Impaired mobility   Functional Status Survey:    Vitals:   07/07/16 1225  BP: 110/60  Pulse: 60  Resp: 16  Temp: (!) 96.6 F (35.9 C)  Weight: 195 lb 9.6 oz (88.7 kg)  Height: 5\' 5"  (1.651 m)   Body mass index is 32.55 kg/m. Physical Exam  Labs reviewed:  Recent Labs  09/12/15 1335 05/09/16 06/30/16 07/06/16 2025  NA 142 142 141 141  K  4.4 4.9 4.6 4.9  CL 109  --   --  108  CO2 27  --   --  27  GLUCOSE 87  --   --  114*  BUN 17 30* 22* 26*  CREATININE 1.22* 1.6* 1.2* 1.18*  CALCIUM 9.6  --   --  9.3    Recent Labs  05/09/16 06/30/16 07/06/16 2025  AST 12* 15 18  ALT 9 9 15   ALKPHOS 66 74 75  BILITOT  --   --  0.7  PROT  --   --  7.4  ALBUMIN  --   --  3.9    Recent Labs  09/12/15 1335  06/06/16 06/30/16 07/06/16 2025  WBC 3.8*  < > 4.0 4.4 4.1  NEUTROABS  --   --   --   --  2.6  HGB 10.9*  < > 10.2* 10.0* 10.5*  HCT 36.5  < > 32* 31* 33.7*  MCV 100.6*  --   --   --  99.1  PLT 166  < > 152 181 201  < > = values in this interval not displayed. Lab Results  Component Value Date   TSH 2.03 05/09/2016   Lab Results  Component Value Date   HGBA1C 5.4 06/04/2014   Lab Results  Component Value Date   CHOL 193 06/04/2014   HDL 49 06/04/2014   LDLCALC 124 06/04/2014   TRIG 102 06/04/2014   CHOLHDL 2.7 06/27/2012    Significant Diagnostic Results in last 30 days:  Dg Chest 2 View  Result Date: 07/06/2016 CLINICAL DATA:  Altered mental status EXAM: CHEST  2 VIEW COMPARISON:  09/12/2015 FINDINGS: The lungs are clear wiithout focal pneumonia, edema, pneumothorax or pleural effusion.  Cardiopericardial silhouette is at upper limits of normal for size. The visualized bony structures of the thorax are intact. Telemetry leads overlie the chest. IMPRESSION: Stable.  No acute findings. Electronically Signed   By: Misty Stanley M.D.   On: 07/06/2016 19:34    Assessment/Plan 1. Depression, psychotic [facility])     Family/ staff Communication:   Labs/tests ordered:

## 2016-07-07 NOTE — Progress Notes (Signed)
Location:  [place] Room Number: 427 Place of Service:  [facility] [id number]) Provider:  Audreyanna Butkiewicz, Manxie   NP  Estill Dooms, MD  Patient Care Team: Estill Dooms, MD as PCP - General (Internal Medicine) Stark Klein, MD as Consulting Physician (General Surgery) Magrinat, Virgie Dad, MD as Consulting Physician (Hematology and Oncology) Kyung Rudd, MD as Consulting Physician (Radiation Oncology) Luberta Mutter, MD as Consulting Physician (Ophthalmology) Samella Lucchetti X, NP as Nurse Practitioner (Internal Medicine)  Extended Emergency Contact Information Primary Emergency Contact: [name],[name] Address: [address]          [place], [place] [address] [place] Phone: [phone] Work Phone: [phone] Relation: Daughter  Code Status:  Full Code Goals of care: Advanced Directive information Advanced Directives 07/07/2016  Does Patient Have a Medical Advance Directive? Yes  Type of Paramedic of [place];Living will  Does patient want to make changes to medical advance directive? No - Patient declined  Copy of [place] in Chart? Yes  Would patient like information on creating a medical advance directive? -  Pre-existing out of facility DNR order (yellow form or pink MOST form) -     Chief Complaint  Patient presents with  . Acute Visit    hosp. f/u-> sent for non responsiveness    HPI:  Pt is a 83 y.o. female seen today for an acute visit for f/u ED eval 07/06/16 for AMS, UA negative for UTI, BNP, CBC CMP troponin I, CXR EKG done, unremarkable. Seroquel 25mg  started 07/05/16, total 2 doses may contributory. The patient is alert, verbal, no focal neurological symptoms upon my visit today. She is afebrile, no O2 desaturation, denied chest pain, abd pain, nausea, vomiting, or dysuria.   Hx of dementia, not taking memory preserving meds, resides in [facility] for care assistance. HTN controlled on Amlodipine 5mg  qd.  GERD, taking Zantac. Diarrhea,  on Levsin 0.125mg , Lomotil prn   Past Medical History:  Diagnosis Date  . Abnormal weight gain   . Abnormality of gait   . Acute upper respiratory infections of unspecified site   . Anemia, unspecified   . Appendicitis   . Arthritis   . Chronic kidney disease, stage III (moderate)   . Dementia in conditions classified elsewhere without behavioral disturbance   . Depression   . Diverticulitis   . Diverticulosis of colon (without mention of hemorrhage)   . Dizziness and giddiness   . Edema of leg   . GERD (gastroesophageal reflux disease)   . Glaucoma   . Hyperlipidemia LDL goal < 100   . Hypertension   . Irritable bowel syndrome   . Irritable bowel syndrome   . Leukocytopenia, unspecified   . Malignant neoplasm of breast (female), unspecified site   . Memory loss   . Other B-complex deficiencies   . Other malaise and fatigue   . Other specified symptom associated with female genital organs   . Personal history of fall   . Polymyalgia rheumatica ([place])   . Regional enteritis of unspecified site   . Sciatica   . Sciatica   . Sebaceous cyst   . Unspecified arthropathy, shoulder region   . Unspecified glaucoma(365.9)   . Unspecified vitamin D deficiency   . Urge incontinence   . Vertigo     Past Medical History:  Diagnosis Date  . Abnormal weight gain   . Abnormality of gait   . Acute upper respiratory infections of unspecified site   .  Anemia, unspecified   . Appendicitis   . Arthritis   . Chronic kidney disease, stage III (moderate)   . Dementia in conditions classified elsewhere without behavioral disturbance   . Depression   . Diverticulitis   . Diverticulosis of colon (without mention of hemorrhage)   . Dizziness and giddiness   . Edema of leg   . GERD (gastroesophageal reflux disease)   . Glaucoma   . Hyperlipidemia LDL goal < 100   . Hypertension   . Irritable bowel syndrome   . Irritable bowel syndrome   .  Leukocytopenia, unspecified   . Malignant neoplasm of breast (female), unspecified site   . Memory loss   . Other B-complex deficiencies   . Other malaise and fatigue   . Other specified symptom associated with female genital organs   . Personal history of fall   . Polymyalgia rheumatica ([place])   . Regional enteritis of unspecified site   . Sciatica   . Sciatica   . Sebaceous cyst   . Unspecified arthropathy, shoulder region   . Unspecified glaucoma(365.9)   . Unspecified vitamin D deficiency   . Urge incontinence   . Vertigo    Past Surgical History:  Procedure Laterality Date  . BREAST LUMPECTOMY  10/28/10   left breast   . BREAST SURGERY     R breast lumpectomy for benign disease  . CATARACT EXTRACTION  1990  . CHOLECYSTECTOMY  1972  . NASAL SINUS SURGERY  1966    Allergies  Allergen Reactions  . Codeine     States she blacks out. Listed on [facility]    Outpatient Encounter Prescriptions as of 07/07/2016  Medication Sig  . acetaminophen (TYLENOL) 500 MG chewable tablet Chew 1,000 mg by mouth 3 (three) times daily. Take 2 tablets three times a day   . amLODipine (NORVASC) 5 MG tablet Take 5 mg by mouth daily.  Marland Kitchen amoxicillin-clavulanate (AUGMENTIN) 500-125 MG tablet Take 1 tablet by mouth 2 (two) times daily.  . bimatoprost (LUMIGAN) 0.01 % SOLN Place 1 drop into both eyes. One drop into both eyes at bedtime  . brimonidine-timolol (COMBIGAN) 0.2-0.5 % ophthalmic solution Place 1 drop into both eyes 2 (two) times daily.   . calcium carbonate (TUMS - DOSED IN MG ELEMENTAL CALCIUM) 500 MG chewable tablet Chew 1 tablet by mouth daily.  . chlorhexidine (PERIDEX) 0.12 % solution Use as directed 15 mLs in the mouth or throat at bedtime. One teaspoon to brush teeth at bedtime   . citalopram (CELEXA) 10 MG tablet Take 1 tablet (10 mg total) by mouth daily.  . diclofenac sodium (VOLTAREN) 1 % GEL Apply 2 g topically 3 (three) times daily.  . diphenoxylate-atropine (LOMOTIL) 2.5-0.025 MG  tablet 2 tablets twice daily to prevent diarrhea  . dorzolamide (TRUSOPT) 2 % ophthalmic solution Place 1 drop into both eyes 2 (two) times daily.  Marland Kitchen guaiFENesin-codeine (ROBAFEN AC) 100-10 MG/5ML syrup Take by mouth. 4 ml four times daily as needed for cough and con  . hyoscyamine (LEVSIN, ANASPAZ) 0.125 MG tablet 1 tablet before each meal to help relieve gas and abdominal discomfort (Patient taking differently: Take 0.125 mg by mouth 3 (three) times daily. )  . lidocaine (LIDODERM) 5 % APPLY 1 PATCH TO EACH SHOULDER EVERY MORNING, KEEP ON FOR 12 HOURS AND THEN REMOVE FROM EACH SHOULDER AT BEDTIME  . Loratadine (CLARITIN) 10 MG CAPS Take 10 mg by mouth daily as needed (allergies).   . Multiple Vitamin (MULTIVITAMIN WITH MINERALS)  TABS Take 1 tablet by mouth daily with breakfast.   . ranitidine (ZANTAC) 150 MG tablet Take 150 mg by mouth daily as needed for heartburn.   . risperiDONE (RISPERDAL) 0.25 MG tablet Take 0.25 mg by mouth daily as needed. For increased agitation  X 1week.  . saccharomyces boulardii (FLORASTOR) 250 MG capsule Take 250 mg by mouth 2 (two) times daily.  . [DISCONTINUED] QUEtiapine (SEROQUEL) 25 MG tablet Take 25 mg by mouth daily.   No facility-administered encounter medications on file as of 07/07/2016.     Review of Systems  Constitutional: Positive for activity change, appetite change and fatigue. Negative for diaphoresis, fever and unexpected weight change.       Obese  HENT: Positive for hearing loss. Negative for congestion, dental problem, drooling, ear discharge, ear pain, facial swelling, mouth sores, nosebleeds, postnasal drip, rhinorrhea, sinus pain, sinus pressure, sneezing, sore throat, tinnitus, trouble swallowing and voice change.   Eyes: Negative for photophobia, pain, discharge, redness, itching and visual disturbance.  Respiratory: Positive for cough and shortness of breath (at rest). Negative for apnea, choking, chest tightness and wheezing.     Cardiovascular: Negative for chest pain, palpitations and leg swelling.  Gastrointestinal: Positive for diarrhea. Negative for abdominal distention, abdominal pain, anal bleeding, blood in stool, constipation, nausea, rectal pain and vomiting.  Endocrine: Negative for cold intolerance, heat intolerance, polydipsia, polyphagia and polyuria.  Genitourinary: Negative for decreased urine volume, difficulty urinating, dysuria, flank pain, frequency, hematuria and urgency.       UTI 05/07/16 with E. Coli treated with Septra DS. CKD @  Musculoskeletal: Positive for arthralgias, back pain and gait problem (poor balance).       W/c for mobility. Hx plantar fasciitis.  Skin: Negative for color change, pallor, rash and wound.       Dark circle around her eyes.   Allergic/Immunologic: Positive for food allergies. Negative for environmental allergies and immunocompromised state.  Neurological: Negative for dizziness, tremors, seizures, syncope, facial asymmetry, speech difficulty, weakness, light-headedness and numbness.  Hematological: Negative for adenopathy. Does not bruise/bleed easily.       Anemia  Psychiatric/Behavioral: Positive for confusion and suicidal ideas. Negative for agitation, behavioral problems, hallucinations, self-injury and sleep disturbance. The patient is nervous/anxious. The patient is not hyperactive.     Immunization History  Administered Date(s) Administered  . Influenza,inj,Quad PF,36+ Mos 11/07/2012, 10/03/2013  . Influenza-Unspecified 12/30/2009, 11/17/2010, 11/30/2011  . Pneumococcal Conjugate-13 01/05/2014  . Pneumococcal Polysaccharide-23 01/30/2006   Pertinent  Health Maintenance Due  Topic Date Due  . INFLUENZA VACCINE  08/30/2016  . DEXA SCAN  Completed  . PNA vac Low Risk Adult  Completed   Fall Risk  05/29/2016 01/05/2014 10/03/2013 09/05/2012 09/05/2012  Falls in the past year? Yes Yes Yes Yes Yes  Number falls in past yr: 2 or more 2 or more 2 or more - 2 or more   Injury with Fall? Yes - - - Yes  Risk Factor Category  High Fall Risk - High Fall Risk - High Fall Risk  Risk for fall due to : - - History of fall(s);Impaired balance/gait;Impaired mobility;Impaired vision;Mental status change - Impaired balance/gait;Impaired mobility   Functional Status Survey:    Vitals:   07/07/16 1225  BP: 110/60  Pulse: 60  Resp: 16  Temp: (!) 96.6 F (35.9 C)  Weight: 195 lb 9.6 oz (88.7 kg)  Height: 5\' 5"  (1.651 m)   Body mass index is 32.55 kg/m. Physical Exam  Constitutional: She appears well-developed  and well-nourished. No distress.  Eyes: Conjunctivae and EOM are normal. Pupils are equal, round, and reactive to light. Right eye exhibits no discharge. Left eye exhibits no discharge.  Neck: Normal range of motion. Neck supple. No JVD present. No tracheal deviation present. No thyromegaly present.  Cardiovascular: Normal rate, regular rhythm, normal heart sounds and intact distal pulses.   Pulmonary/Chest: Breath sounds normal. No stridor. No respiratory distress. She has no wheezes. She has no rales.  Abdominal: Soft. Bowel sounds are normal. She exhibits no distension and no mass. There is no tenderness.  Musculoskeletal: Normal range of motion.  Difficulty standing on her own, w/c for mobility.   Neurological: She is alert.  dementia  Skin: Skin is warm and dry.  Psychiatric: She has a normal mood and affect. Her behavior is normal.  Feel anxious at times    Labs reviewed:  Recent Labs  09/12/15 1335 05/09/16 06/30/16 07/06/16 2025  NA 142 142 141 141  K 4.4 4.9 4.6 4.9  CL 109  --   --  108  CO2 27  --   --  27  GLUCOSE 87  --   --  114*  BUN 17 30* 22* 26*  CREATININE 1.22* 1.6* 1.2* 1.18*  CALCIUM 9.6  --   --  9.3    Recent Labs  05/09/16 06/30/16 07/06/16 2025  AST 12* 15 18  ALT 9 9 15   ALKPHOS 66 74 75  BILITOT  --   --  0.7  PROT  --   --  7.4  ALBUMIN  --   --  3.9    Recent Labs  09/12/15 1335  06/06/16 06/30/16  07/06/16 2025  WBC 3.8*  < > 4.0 4.4 4.1  NEUTROABS  --   --   --   --  2.6  HGB 10.9*  < > 10.2* 10.0* 10.5*  HCT 36.5  < > 32* 31* 33.7*  MCV 100.6*  --   --   --  99.1  PLT 166  < > 152 181 201  < > = values in this interval not displayed. Lab Results  Component Value Date   TSH 2.03 05/09/2016   Lab Results  Component Value Date   HGBA1C 5.4 06/04/2014   Lab Results  Component Value Date   CHOL 193 06/04/2014   HDL 49 06/04/2014   LDLCALC 124 06/04/2014   TRIG 102 06/04/2014   CHOLHDL 2.7 06/27/2012    Significant Diagnostic Results in last 30 days:  Dg Chest 2 View  Result Date: 07/06/2016 CLINICAL DATA:  Altered mental status EXAM: CHEST  2 VIEW COMPARISON:  09/12/2015 FINDINGS: The lungs are clear wiithout focal pneumonia, edema, pneumothorax or pleural effusion. Cardiopericardial silhouette is at upper limits of normal for size. The visualized bony structures of the thorax are intact. Telemetry leads overlie the chest. IMPRESSION: Stable.  No acute findings. Electronically Signed   By: Misty Stanley M.D.   On: 07/06/2016 19:34    Assessment/Plan Depression, psychotic ([place]) ED eval 07/06/16 for AMS, UA negative for UTI, BNP, CBC CMP troponin I, CXR EKG done, unremarkable. Seroquel 25mg  started 07/05/16, total 2 doses may contributory. The patient is alert, verbal, no focal neurological symptoms upon my visit today. She is afebrile, no O2 desaturation, denied chest pain, abd pain, nausea, vomiting, or dysuria.  07/07/16 dc Seroquel, continue Celexa 10mg , observe the patient.   Essential hypertension, benign Controlled, continue Amlodipine 5mg  qd  GERD (gastroesophageal reflux disease) Stable, continue  Zantac.   Altered mental status, unspecified She is at her baseline mentation, in her usual state of health upon my examination today, Seroquel may contributory, dc Seroquel, observe the patient.      Family/ staff Communication: [place]   Labs/tests ordered:  none

## 2016-07-07 NOTE — Assessment & Plan Note (Signed)
Stable, continue Zantac.

## 2016-07-07 NOTE — Assessment & Plan Note (Signed)
ED eval 07/06/16 for AMS, UA negative for UTI, BNP, CBC CMP troponin I, CXR EKG done, unremarkable. Seroquel 25mg  started 07/05/16, total 2 doses may contributory. The patient is alert, verbal, no focal neurological symptoms upon my visit today. She is afebrile, no O2 desaturation, denied chest pain, abd pain, nausea, vomiting, or dysuria.  07/07/16 dc Seroquel, continue Celexa 10mg , observe the patient.

## 2016-07-07 NOTE — Discharge Instructions (Signed)
There were no abnormalities on the urine. Please communicate with your primary physician regarding your new medications as you may be over medicated. Return to the ED as needed.

## 2016-07-11 NOTE — Patient Instructions (Signed)
Monitor for return of targeted symptoms.

## 2016-07-17 ENCOUNTER — Encounter: Payer: Self-pay | Admitting: Internal Medicine

## 2016-07-17 ENCOUNTER — Non-Acute Institutional Stay (SKILLED_NURSING_FACILITY): Payer: Medicare Other | Admitting: Internal Medicine

## 2016-07-17 DIAGNOSIS — F0280 Dementia in other diseases classified elsewhere without behavioral disturbance: Secondary | ICD-10-CM | POA: Diagnosis not present

## 2016-07-17 DIAGNOSIS — G8929 Other chronic pain: Secondary | ICD-10-CM

## 2016-07-17 DIAGNOSIS — G309 Alzheimer's disease, unspecified: Secondary | ICD-10-CM

## 2016-07-17 DIAGNOSIS — R2681 Unsteadiness on feet: Secondary | ICD-10-CM | POA: Diagnosis not present

## 2016-07-17 DIAGNOSIS — K219 Gastro-esophageal reflux disease without esophagitis: Secondary | ICD-10-CM

## 2016-07-17 DIAGNOSIS — J309 Allergic rhinitis, unspecified: Secondary | ICD-10-CM | POA: Diagnosis not present

## 2016-07-17 DIAGNOSIS — N183 Chronic kidney disease, stage 3 unspecified: Secondary | ICD-10-CM | POA: Insufficient documentation

## 2016-07-17 DIAGNOSIS — H409 Unspecified glaucoma: Secondary | ICD-10-CM | POA: Diagnosis not present

## 2016-07-17 DIAGNOSIS — F329 Major depressive disorder, single episode, unspecified: Secondary | ICD-10-CM

## 2016-07-17 DIAGNOSIS — I10 Essential (primary) hypertension: Secondary | ICD-10-CM | POA: Diagnosis not present

## 2016-07-17 DIAGNOSIS — Z86000 Personal history of in-situ neoplasm of breast: Secondary | ICD-10-CM | POA: Diagnosis not present

## 2016-07-17 DIAGNOSIS — F32A Depression, unspecified: Secondary | ICD-10-CM

## 2016-07-17 DIAGNOSIS — M545 Low back pain, unspecified: Secondary | ICD-10-CM

## 2016-07-17 NOTE — Progress Notes (Signed)
Provider:   Location:  [place] Room Number: 15 Place of Service:  SNF (31)  PCP: Blanchie Serve, MD Patient Care Team: Blanchie Serve, MD as PCP - General (Internal Medicine) Stark Klein, MD as Consulting Physician (General Surgery) Magrinat, Virgie Dad, MD as Consulting Physician (Hematology and Oncology) Kyung Rudd, MD as Consulting Physician (Radiation Oncology) Luberta Mutter, MD as Consulting Physician (Ophthalmology) Mast, Man X, NP as Nurse Practitioner (Internal Medicine)  Extended Emergency Contact Information Primary Emergency Contact: [name],[name] Address: [address]          [place], [place] [address] [place] Phone: [phone] Work Phone: [phone] Relation: Daughter  Code Status: full code  Goals of Care: Advanced Directive information Advanced Directives 07/17/2016  Does Patient Have a Medical Advance Directive? Yes  Type of Paramedic of [place];Living will  Does patient want to make changes to medical advance directive? -  Copy of [place] in Chart? Yes  Would patient like information on creating a medical advance directive? -  Pre-existing out of facility DNR order (yellow form or pink MOST form) -      Chief Complaint  Patient presents with  . New Admit To SNF    admit from AL    HPI: Patient is a 83 y.o. female seen today for admission to skilled nursing facility. She has been residing in assisted living facility prior to this. Due to her increased need for care with her ADLs, she has moved to SNF. She requires 2 person assist with sit to stand transfer. She requires transfer by hoyer lift in and out of bed. She is wheelchair bound. She has medical history of alzheimer's dementia, GERD, HTN, ckd stage 3, chronic back pain among others. She is seen in her room today.   Past Medical History:  Diagnosis Date  . Abnormal weight gain   . Abnormality of  gait   . Acute upper respiratory infections of unspecified site   . Anemia, unspecified   . Appendicitis   . Arthritis   . Chronic kidney disease, stage III (moderate)   . Dementia in conditions classified elsewhere without behavioral disturbance   . Depression   . Diverticulitis   . Diverticulosis of colon (without mention of hemorrhage)   . Dizziness and giddiness   . Edema of leg   . GERD (gastroesophageal reflux disease)   . Glaucoma   . Hyperlipidemia LDL goal < 100   . Hypertension   . Irritable bowel syndrome   . Irritable bowel syndrome   . Leukocytopenia, unspecified   . Malignant neoplasm of breast (female), unspecified site   . Memory loss   . Other B-complex deficiencies   . Other malaise and fatigue   . Other specified symptom associated with female genital organs   . Personal history of fall   . Polymyalgia rheumatica ([place])   . Regional enteritis of unspecified site   . Sciatica   . Sciatica   . Sebaceous cyst   . Unspecified arthropathy, shoulder region   . Unspecified glaucoma(365.9)   . Unspecified vitamin D deficiency   . Urge incontinence   . Vertigo    Past Surgical History:  Procedure Laterality Date  . BREAST LUMPECTOMY  10/28/10   left breast   . BREAST SURGERY     R breast lumpectomy for benign disease  . CATARACT EXTRACTION  1990  . CHOLECYSTECTOMY  1972  . NASAL SINUS SURGERY  1966    reports that she has never smoked. She has never used smokeless tobacco. She reports that she does not drink alcohol or use drugs. Social History   Social History  . Marital status: Widowed    Spouse name: N/A  . Number of children: N/A  . Years of education: N/A   Occupational History  . retired    Social History Main Topics  . Smoking status: Never Smoker  . Smokeless tobacco: Never Used  . Alcohol use No  . Drug use: No  . Sexual activity: No   Other Topics Concern  . Not on file   Social History Narrative  . No narrative on file     Functional Status Survey:    Family History  Problem Relation Age of Onset  . Stroke Father   . Hypertension Mother   . Stroke Mother   . Breast cancer Mother   . Heart attack Brother   . Cancer Brother   . Stroke Brother   . Heart attack Son   . Colon cancer Sister     Health Maintenance  Topic Date Due  . Samul Dada  03/16/1939  . INFLUENZA VACCINE  08/30/2016  . DEXA SCAN  Completed  . PNA vac Low Risk Adult  Completed    Allergies  Allergen Reactions  . Codeine     States she blacks out. Listed on [facility]    Outpatient Encounter Prescriptions as of 07/17/2016  Medication Sig  . acetaminophen (TYLENOL) 500 MG chewable tablet Chew 500 mg by mouth every 6 (six) hours as needed for pain.   Marland Kitchen acetaminophen (TYLENOL) 500 MG tablet Take 1,000 mg by mouth 3 (three) times daily. 8AM, 2PM, 8PM  . amLODipine (NORVASC) 5 MG tablet Take 5 mg by mouth daily.  . bimatoprost (LUMIGAN) 0.01 % SOLN Place 1 drop into both eyes. One drop into both eyes at bedtime  . brimonidine-timolol (COMBIGAN) 0.2-0.5 % ophthalmic solution Place 1 drop into both eyes 2 (two) times daily.   . calcium carbonate (TUMS - DOSED IN MG ELEMENTAL CALCIUM) 500 MG chewable tablet Chew 1 tablet by mouth daily.  . chlorhexidine (PERIDEX) 0.12 % solution Use as directed 15 mLs in the mouth or throat at bedtime. One teaspoon to brush teeth at bedtime   . citalopram (CELEXA) 10 MG tablet Take 1 tablet (10 mg total) by mouth daily.  . diclofenac sodium (VOLTAREN) 1 % GEL Apply 2 g topically 3 (three) times daily.  . diphenoxylate-atropine (LOMOTIL) 2.5-0.025 MG tablet 2 tablets twice daily to prevent diarrhea  . dorzolamide (TRUSOPT) 2 % ophthalmic solution Place 2 drops into both eyes 2 (two) times daily.  Marland Kitchen guaiFENesin-codeine (ROBAFEN AC) 100-10 MG/5ML syrup Take 4 mLs by mouth 4 (four) times daily as needed for cough or congestion.  . hyoscyamine (LEVSIN, ANASPAZ) 0.125 MG tablet Take 0.125 mg by mouth 3  (three) times daily with meals. Take at 8AM, 12PM, and 5PM to help relieve gas and abdominal discomfort.  Marland Kitchen JOHNSONS BABY CORNSTARCH EX Apply 1 application topically 2 (two) times daily as needed. Apply under breast for moisture  . lidocaine (LIDODERM) 5 % APPLY 1 PATCH TO EACH SHOULDER EVERY MORNING, KEEP ON FOR 12 HOURS AND THEN REMOVE FROM EACH SHOULDER AT BEDTIME  . loperamide (IMODIUM A-D) 2 MG tablet Take 2 mg by mouth as directed. Give 4mg  initial dose, then 2mg  after each loose stool x48 hours with a maximum of 16mg  in 24 hours. If diarrhea persists,  notify MD.  . Loratadine (CLARITIN) 10 MG CAPS Take 10 mg by mouth daily as needed (allergies).   . Multiple Vitamin (MULTIVITAMIN WITH MINERALS) TABS Take 1 tablet by mouth daily with breakfast.   . ranitidine (ZANTAC) 150 MG tablet Take 150 mg by mouth daily as needed for heartburn.   . [DISCONTINUED] dorzolamide (TRUSOPT) 2 % ophthalmic solution Place 1 drop into both eyes 2 (two) times daily. (Patient not taking: Reported on 07/17/2016)  . [DISCONTINUED] guaiFENesin-codeine (ROBAFEN AC) 100-10 MG/5ML syrup Take by mouth. 4 ml four times daily as needed for cough and con  . [DISCONTINUED] hyoscyamine (LEVSIN, ANASPAZ) 0.125 MG tablet 1 tablet before each meal to help relieve gas and abdominal discomfort (Patient not taking: Reported on 07/17/2016)  . [DISCONTINUED] risperiDONE (RISPERDAL) 0.25 MG tablet Take 0.25 mg by mouth daily as needed. For increased agitation  X 1week.  . [DISCONTINUED] saccharomyces boulardii (FLORASTOR) 250 MG capsule Take 250 mg by mouth 2 (two) times daily.   No facility-administered encounter medications on file as of 07/17/2016.     Review of Systems  Constitutional: Negative for appetite change, chills and fever.       Feeds herself after tray set up, compliant with her medications  HENT: Negative for congestion, hearing loss, mouth sores and rhinorrhea.   Eyes: Negative for pain and visual disturbance.        Wears corrective glasses  Respiratory: Negative for cough and shortness of breath.   Cardiovascular: Negative for chest pain, palpitations and leg swelling.  Gastrointestinal: Positive for constipation. Negative for abdominal pain, nausea and vomiting.  Musculoskeletal: Positive for gait problem. Negative for back pain and neck pain.       No fall has been reported.   Skin:       No pressure ulcer or wound reported  Neurological: Negative for syncope, light-headedness and headaches.  Psychiatric/Behavioral: Negative for dysphoric mood and sleep disturbance. The patient is nervous/anxious.     Vitals:   07/17/16 0906  BP: 118/70  Pulse: 82  Resp: 20  Temp: 98.1 F (36.7 C)  Weight: 195 lb (88.5 kg)  Height: 5\' 5"  (1.651 m)   Body mass index is 32.45 kg/m. Physical Exam  Constitutional: She appears well-developed. No distress.  obese  HENT:  Head: Normocephalic and atraumatic.  Nose: Nose normal.  Mouth/Throat: Oropharynx is clear and moist. No oropharyngeal exudate.  Eyes: Conjunctivae and EOM are normal. Pupils are equal, round, and reactive to light.  Has corrective glasses  Neck: Normal range of motion. Neck supple.  Cardiovascular: Normal rate and regular rhythm.   Pulmonary/Chest: Effort normal and breath sounds normal. She has no wheezes. She has no rales.  Abdominal: Soft. Bowel sounds are normal. She exhibits distension. There is no tenderness. There is no guarding.  Musculoskeletal:  Limited ROM to her shoulders, able to move all 4 extremities, 2 person assist with transfer from sit to stand, hoyer lift for transfer. Trace leg edema. Scoliosis present.   Lymphadenopathy:    She has no cervical adenopathy.  Neurological: She is alert.  Oriented to self only.   Skin: Skin is warm and dry. No rash noted. She is not diaphoretic.  Psychiatric: She has a normal mood and affect.  Poor insight    Labs reviewed: Basic Metabolic Panel:  Recent Labs  09/12/15 1335  05/09/16 06/30/16 07/06/16 2025  NA 142 142 141 141  K 4.4 4.9 4.6 4.9  CL 109  --   --  108  CO2 27  --   --  27  GLUCOSE 87  --   --  114*  BUN 17 30* 22* 26*  CREATININE 1.22* 1.6* 1.2* 1.18*  CALCIUM 9.6  --   --  9.3   Liver Function Tests:  Recent Labs  05/09/16 06/30/16 07/06/16 2025  AST 12* 15 18  ALT 9 9 15   ALKPHOS 66 74 75  BILITOT  --   --  0.7  PROT  --   --  7.4  ALBUMIN  --   --  3.9   No results for input(s): LIPASE, AMYLASE in the last 8760 hours. No results for input(s): AMMONIA in the last 8760 hours. CBC:  Recent Labs  09/12/15 1335  06/06/16 06/30/16 07/06/16 2025  WBC 3.8*  < > 4.0 4.4 4.1  NEUTROABS  --   --   --   --  2.6  HGB 10.9*  < > 10.2* 10.0* 10.5*  HCT 36.5  < > 32* 31* 33.7*  MCV 100.6*  --   --   --  99.1  PLT 166  < > 152 181 201  < > = values in this interval not displayed. Cardiac Enzymes: No results for input(s): CKTOTAL, CKMB, CKMBINDEX, TROPONINI in the last 8760 hours. BNP: Invalid input(s): POCBNP Lab Results  Component Value Date   HGBA1C 5.4 06/04/2014   Lab Results  Component Value Date   TSH 2.03 05/09/2016   No results found for: VITAMINB12 No results found for: FOLATE No results found for: IRON, TIBC, FERRITIN  Imaging and Procedures obtained prior to SNF admission: Dg Chest 2 View  Result Date: 07/06/2016 CLINICAL DATA:  Altered mental status EXAM: CHEST  2 VIEW COMPARISON:  09/12/2015 FINDINGS: The lungs are clear wiithout focal pneumonia, edema, pneumothorax or pleural effusion. Cardiopericardial silhouette is at upper limits of normal for size. The visualized bony structures of the thorax are intact. Telemetry leads overlie the chest. IMPRESSION: Stable.  No acute findings. Electronically Signed   By: Misty Stanley M.D.   On: 07/06/2016 19:34    Assessment/Plan  Unsteady gait Will have her work with physical therapy and occupational therapy team to help with gait training and muscle strengthening  exercises.fall precautions. Skin care. Encourage to be out of bed.   Hypertension Monitor BP. Continue amlodipine 5 mg daily  Alzheimer's dementia Oriented only to self. Provide supportive care. Will need to set up care plan meeting to review goals of care. SLP to evaluate and treat for risk for aspiration and cognitive exercises. Fall precautions. Pressure ulcer prophylaxis.   Depression Stable mood. Monitor and provide supportive care. Continue celexa 10 mg daily.  Glaucoma Continue her eye drops, ophthalmology follow up  Neoplasm left breast S/p left lumpectomy 10/28/10 for DCIS, high grade, ER and PR positive with negative margins and s/p letrozole for 1 year. On chart review, appears to have missed her last oncology appointment in 03/2014. Make follow up with oncology Dr Doris Cheadle.   Allergic rhinitis Currently on loratadine daily as needed, monitor clinically, if stable and has not required prn dosing, consider discontinuation next visit.  gerd Continue ranitidine 150 mg daily as needed and monitor  Chronic back pain Xray thoracic and lumbar spine reviewed showing scoliosis and degenerative disc disease. Continue tylenol 1000 mg tid, lidocaine patch daily, prn voltaren gel and prn tylenol for breakthrough pain. Back precautions.   ckd stage 3 Monitor BMP periodically. Reviewed recent bmp   Family/ staff Communication: reviewed care plan  with patient and charge nurse.    Labs/tests ordered: none  Blanchie Serve, MD Internal Medicine [facility] [address] [place], [place] [address] Cell Phone (Monday-Friday 8 am - 5 pm): [phone] On Call: [phone] and follow prompts after 5 pm and on weekends Office Phone: [phone] Office Fax: [phone]

## 2016-07-18 ENCOUNTER — Encounter: Payer: Self-pay | Admitting: Internal Medicine

## 2016-07-18 ENCOUNTER — Non-Acute Institutional Stay (SKILLED_NURSING_FACILITY): Payer: Medicare Other | Admitting: Internal Medicine

## 2016-07-18 DIAGNOSIS — R638 Other symptoms and signs concerning food and fluid intake: Secondary | ICD-10-CM

## 2016-07-18 DIAGNOSIS — M19012 Primary osteoarthritis, left shoulder: Secondary | ICD-10-CM | POA: Diagnosis not present

## 2016-07-18 DIAGNOSIS — R400 Somnolence: Secondary | ICD-10-CM

## 2016-07-18 DIAGNOSIS — M19011 Primary osteoarthritis, right shoulder: Secondary | ICD-10-CM | POA: Diagnosis not present

## 2016-07-18 DIAGNOSIS — R258 Other abnormal involuntary movements: Secondary | ICD-10-CM

## 2016-07-18 NOTE — Progress Notes (Signed)
Location:  [facility] Room Number: 15 Place of Service:  SNF [phone]) Provider:  Kin Galbraith Molly Maduro, Karalee Height, MD  Patient Care Team: Blanchie Serve, MD as PCP - General (Internal Medicine) Stark Klein, MD as Consulting Physician (General Surgery) Magrinat, Virgie Dad, MD as Consulting Physician (Hematology and Oncology) Kyung Rudd, MD as Consulting Physician (Radiation Oncology) Luberta Mutter, MD as Consulting Physician (Ophthalmology) Mast, Man X, NP as Nurse Practitioner (Internal Medicine)  Extended Emergency Contact Information Primary Emergency Contact: [name],[name] Address: [address]          [place], [place] [address] [place] Phone: [phone] Work Phone: [phone] Relation: Daughter  Code Status:  Full Code  Goals of care: Advanced Directive information Advanced Directives 07/17/2016  Does Patient Have a Medical Advance Directive? Yes  Type of Paramedic of [place];Living will  Does patient want to make changes to medical advance directive? -  Copy of [place] in Chart? Yes  Would patient like information on creating a medical advance directive? -  Pre-existing out of facility DNR order (yellow form or pink MOST form) -     Chief Complaint  Patient presents with  . Acute Visit    Tremor    HPI:  Pt is a 83 y.o. female seen today for an acute visit for tremors noted by her daughter. Patient is seen in her room today. She appears somnolent. She responds to her name call by opening her eyes but goes back to sleep. She has been eating minimally since yesterday and did not eat any breakfast this am. She took her medication with water. Staff have noted few involuntary jerking movement with her arms and legs at times. No witnessed active seizure activity.    Past Medical History:  Diagnosis Date  . Abnormal weight gain   . Abnormality of gait   . Acute upper  respiratory infections of unspecified site   . Anemia, unspecified   . Appendicitis   . Arthritis   . Chronic kidney disease, stage III (moderate)   . Dementia in conditions classified elsewhere without behavioral disturbance   . Depression   . Diverticulitis   . Diverticulosis of colon (without mention of hemorrhage)   . Dizziness and giddiness   . Edema of leg   . GERD (gastroesophageal reflux disease)   . Glaucoma   . Hyperlipidemia LDL goal < 100   . Hypertension   . Irritable bowel syndrome   . Irritable bowel syndrome   . Leukocytopenia, unspecified   . Malignant neoplasm of breast (female), unspecified site   . Memory loss   . Other B-complex deficiencies   . Other malaise and fatigue   . Other specified symptom associated with female genital organs   . Personal history of fall   . Polymyalgia rheumatica ([place])   . Regional enteritis of unspecified site   . Sciatica   . Sciatica   . Sebaceous cyst   . Unspecified arthropathy, shoulder region   . Unspecified glaucoma(365.9)   . Unspecified vitamin D deficiency   . Urge incontinence   . Vertigo    Past Surgical History:  Procedure Laterality Date  . BREAST LUMPECTOMY  10/28/10   left breast   . BREAST SURGERY     R breast lumpectomy for benign disease  . CATARACT EXTRACTION  1990  . CHOLECYSTECTOMY  1972  . NASAL SINUS SURGERY  1966    Allergies  Allergen Reactions  .  Codeine     States she blacks out. Listed on [facility]    Outpatient Encounter Prescriptions as of 07/18/2016  Medication Sig  . acetaminophen (TYLENOL) 500 MG chewable tablet Chew 500 mg by mouth daily as needed for pain.   Marland Kitchen acetaminophen (TYLENOL) 500 MG tablet Take 1,000 mg by mouth 3 (three) times daily. 8AM, 1PM, 6PM  . amLODipine (NORVASC) 5 MG tablet Take 5 mg by mouth daily.  . bimatoprost (LUMIGAN) 0.01 % SOLN Place 1 drop into both eyes. One drop into both eyes at bedtime  . brimonidine-timolol (COMBIGAN) 0.2-0.5 % ophthalmic solution  Place 1 drop into both eyes 2 (two) times daily.   . calcium carbonate (TUMS - DOSED IN MG ELEMENTAL CALCIUM) 500 MG chewable tablet Chew 1 tablet by mouth daily.  . chlorhexidine (PERIDEX) 0.12 % solution Use as directed 15 mLs in the mouth or throat at bedtime. One teaspoon to brush teeth at bedtime   . citalopram (CELEXA) 10 MG tablet Take 1 tablet (10 mg total) by mouth daily.  . diclofenac sodium (VOLTAREN) 1 % GEL Apply 2 g topically 3 (three) times daily.  . diphenoxylate-atropine (LOMOTIL) 2.5-0.025 MG tablet 2 tablets twice daily to prevent diarrhea  . dorzolamide (TRUSOPT) 2 % ophthalmic solution Place 2 drops into both eyes 2 (two) times daily.  Marland Kitchen guaiFENesin-codeine (ROBAFEN AC) 100-10 MG/5ML syrup Take 4 mLs by mouth 4 (four) times daily as needed for cough or congestion.  . hyoscyamine (LEVSIN, ANASPAZ) 0.125 MG tablet Take 0.125 mg by mouth 3 (three) times daily with meals. Take at 8AM, 12PM, and 5PM to help relieve gas and abdominal discomfort.  . lidocaine (LIDODERM) 5 % APPLY 1 PATCH TO EACH SHOULDER EVERY MORNING, KEEP ON FOR 12 HOURS AND THEN REMOVE FROM EACH SHOULDER AT BEDTIME  . loperamide (IMODIUM A-D) 2 MG tablet Take 2 mg by mouth as directed. Give 4mg  initial dose, then 2mg  after each loose stool x48 hours with a maximum of 16mg  in 24 hours. If diarrhea persists, notify MD.  . Loratadine (CLARITIN) 10 MG CAPS Take 10 mg by mouth daily as needed (allergies).   . Multiple Vitamin (MULTIVITAMIN WITH MINERALS) TABS Take 1 tablet by mouth daily with breakfast.   . ranitidine (ZANTAC) 150 MG tablet Take 150 mg by mouth daily as needed for heartburn.   . [DISCONTINUED] JOHNSONS BABY CORNSTARCH EX Apply 1 application topically 2 (two) times daily as needed. Apply under breast for moisture   No facility-administered encounter medications on file as of 07/18/2016.     Review of Systems  Unable to perform ROS: Dementia (with her advanced dementia and somnolence)    Immunization  History  Administered Date(s) Administered  . Influenza,inj,Quad PF,36+ Mos 11/07/2012, 10/03/2013  . Influenza-Unspecified 12/30/2009, 11/17/2010, 11/30/2011  . Pneumococcal Conjugate-13 01/05/2014  . Pneumococcal Polysaccharide-23 01/30/2006   Pertinent  Health Maintenance Due  Topic Date Due  . INFLUENZA VACCINE  08/30/2016  . DEXA SCAN  Completed  . PNA vac Low Risk Adult  Completed   Fall Risk  05/29/2016 01/05/2014 10/03/2013 09/05/2012 09/05/2012  Falls in the past year? Yes Yes Yes Yes Yes  Number falls in past yr: 2 or more 2 or more 2 or more - 2 or more  Injury with Fall? Yes - - - Yes  Risk Factor Category  High Fall Risk - High Fall Risk - High Fall Risk  Risk for fall due to : - - History of fall(s);Impaired balance/gait;Impaired mobility;Impaired vision;Mental status change -  Impaired balance/gait;Impaired mobility   Functional Status Survey:    Vitals:   07/18/16 1045  BP: 138/86  Pulse: 81  Resp: 20  Temp: 98.1 F (36.7 C)  TempSrc: Oral  SpO2: 93%  Weight: 195 lb (88.5 kg)  Height: 5\' 5"  (1.651 m)   Body mass index is 32.45 kg/m. Physical Exam  Constitutional: No distress.  Elderly, obese female  HENT:  Head: Normocephalic and atraumatic.  Mouth/Throat: Oropharynx is clear and moist.  Eyes: Conjunctivae are normal. Pupils are equal, round, and reactive to light.  Neck: Normal range of motion. Neck supple.  Cardiovascular: Normal rate and regular rhythm.   Pulmonary/Chest: Effort normal and breath sounds normal. No respiratory distress. She has no wheezes. She has no rales. She exhibits no tenderness.  Abdominal: Soft. Bowel sounds are normal. She exhibits no distension. There is no tenderness. There is no rebound and no guarding.  Musculoskeletal: She exhibits edema.  On wheelchair, falling asleep during conversation, involuntary jerking movement with right hand, left leg noted at times during conversation  Lymphadenopathy:    She has no cervical  adenopathy.  Neurological:  Mostly falling asleep during conversation but opens eyes to name call. Patient was alert yesterday  Skin: Skin is warm and dry. No rash noted. She is not diaphoretic.  Psychiatric:  Unable to assess    Labs reviewed:  Recent Labs  09/12/15 1335 05/09/16 06/30/16 07/06/16 2025  NA 142 142 141 141  K 4.4 4.9 4.6 4.9  CL 109  --   --  108  CO2 27  --   --  27  GLUCOSE 87  --   --  114*  BUN 17 30* 22* 26*  CREATININE 1.22* 1.6* 1.2* 1.18*  CALCIUM 9.6  --   --  9.3    Recent Labs  05/09/16 06/30/16 07/06/16 2025  AST 12* 15 18  ALT 9 9 15   ALKPHOS 66 74 75  BILITOT  --   --  0.7  PROT  --   --  7.4  ALBUMIN  --   --  3.9    Recent Labs  09/12/15 1335  06/06/16 06/30/16 07/06/16 2025  WBC 3.8*  < > 4.0 4.4 4.1  NEUTROABS  --   --   --   --  2.6  HGB 10.9*  < > 10.2* 10.0* 10.5*  HCT 36.5  < > 32* 31* 33.7*  MCV 100.6*  --   --   --  99.1  PLT 166  < > 152 181 201  < > = values in this interval not displayed. Lab Results  Component Value Date   TSH 2.03 05/09/2016   Lab Results  Component Value Date   HGBA1C 5.4 06/04/2014   Lab Results  Component Value Date   CHOL 193 06/04/2014   HDL 49 06/04/2014   LDLCALC 124 06/04/2014   TRIG 102 06/04/2014   CHOLHDL 2.7 06/27/2012    Significant Diagnostic Results in last 30 days:  Dg Chest 2 View  Result Date: 07/06/2016 CLINICAL DATA:  Altered mental status EXAM: CHEST  2 VIEW COMPARISON:  09/12/2015 FINDINGS: The lungs are clear wiithout focal pneumonia, edema, pneumothorax or pleural effusion. Cardiopericardial silhouette is at upper limits of normal for size. The visualized bony structures of the thorax are intact. Telemetry leads overlie the chest. IMPRESSION: Stable.  No acute findings. Electronically Signed   By: Misty Stanley M.D.   On: 07/06/2016 19:34    Assessment/Plan  Somnolence  Increased somnolence noted. cbg on review 141. Vital signs are stable. On review of her  chart, pt was recently in ED with similar complaints. Infectious workup was negative and there was concern of polypharmacy and she was sent back to her apartment. Her seroquel was then discontinued. Reviewed her medication list today. Concern for polypharmacy persists with her dementia worsening it. She is on cough syrup with codeine in it, discontinue this for now. She is also on hyocyamine, will discontinue this and change to bid prn only as it can contribute to confusion and drowsiness with other anticholinergic side effect. Another possibility is with lidocaine patch. Discontinue this as well. Once she is more awake and alert, consider pain management if indicated. With get lab work done to rule out metabolic etiology as well.   Poor oral intake Likely due to somnolence along with dementia. Will rule out infectious workup. Assist with feed and to take aspiration precautions. SLP to evaluate.  Involuntary jerky movement Likely medication related. Discontinue hyoscyamine and lidocaine and monitor. Fall precaution  Shoulder OA With pain and on daily lidocaine patch. Side effect of this is increased confusion and somnolence. Discontinue this for now and monitor. Tylenol 1000 mg tid scheduled for now.   Family/ staff Communication: reviewed care plan with patient and charge nurse.    Labs/tests ordered:  Cbc with diff, cmp   Blanchie Serve, MD Internal Medicine [facility] [address] [place], [place] [address] Cell Phone (Monday-Friday 8 am - 5 pm): [phone] On Call: [phone] and follow prompts after 5 pm and on weekends Office Phone: [phone] Office Fax: [phone]

## 2016-07-20 DIAGNOSIS — I10 Essential (primary) hypertension: Secondary | ICD-10-CM | POA: Diagnosis not present

## 2016-07-20 DIAGNOSIS — F323 Major depressive disorder, single episode, severe with psychotic features: Secondary | ICD-10-CM | POA: Diagnosis not present

## 2016-07-20 DIAGNOSIS — E785 Hyperlipidemia, unspecified: Secondary | ICD-10-CM | POA: Diagnosis not present

## 2016-07-20 DIAGNOSIS — R4182 Altered mental status, unspecified: Secondary | ICD-10-CM | POA: Diagnosis not present

## 2016-07-20 LAB — CBC AND DIFFERENTIAL
HEMATOCRIT: 30 — AB (ref 36–46)
Hemoglobin: 9.9 — AB (ref 12.0–16.0)
PLATELETS: 245 (ref 150–399)
WBC: 4.9

## 2016-07-20 LAB — HEPATIC FUNCTION PANEL
ALK PHOS: 66 (ref 25–125)
ALT: 10 (ref 7–35)
AST: 14 (ref 13–35)
BILIRUBIN, TOTAL: 0.6

## 2016-07-20 LAB — BASIC METABOLIC PANEL
BUN: 23 — AB (ref 4–21)
Creatinine: 1.3 — AB (ref 0.5–1.1)
Glucose: 100
Potassium: 4.5 (ref 3.4–5.3)
Sodium: 139 (ref 137–147)

## 2016-07-21 ENCOUNTER — Telehealth: Payer: Self-pay | Admitting: Oncology

## 2016-07-21 NOTE — Telephone Encounter (Signed)
Received new patient referral for established patient. Per desk nurse patient scheduled for 7/5 @ 8:30 am with GM - no lab. Spoke with Rhona Leavens, LPN re appointment date/time.

## 2016-07-24 NOTE — Assessment & Plan Note (Signed)
Mood instability, the patient appears anxious, irritated, angry, and somehow suspicious since her Celexa 10mg  qd was discontinued. She states "I am going to kill myself" often when she is angry or frustrated, but she has no suicidal plans. She resides in [place] and under the around clock check for safety. She stated she wouldn't eat until her decreased husband came. Celexa 10mg  qd is resumed 07/05/16 adding Seroquel 25mg  qd if POA consents.

## 2016-07-24 NOTE — Assessment & Plan Note (Signed)
Controlled, continue Amlodipine 5mg  qd

## 2016-07-24 NOTE — Assessment & Plan Note (Signed)
06/30/16 wbc 4.4, Hgb 10.0, plt 181, Na 141, K 4.6, Bun 22, creat 1.21 07/02/16 urine culture E. Coli >100,000c/ml, Augmentin 875mg  bid x 7days

## 2016-07-24 NOTE — Assessment & Plan Note (Signed)
06/30/16 Hgb 10.0

## 2016-07-24 NOTE — Assessment & Plan Note (Signed)
Managed with Levsin 0.125mg , prn Lomotil

## 2016-07-24 NOTE — Assessment & Plan Note (Signed)
Stable, 06/30/16 Bun 22, creat 1.21

## 2016-07-24 NOTE — Progress Notes (Signed)
Location:  [facility] Room Number: 024 Place of Service:  ALF [phone]) Provider:  Mast, Manxie   NP  Blanchie Serve, MD  Patient Care Team: Blanchie Serve, MD as PCP - General (Internal Medicine) Stark Klein, MD as Consulting Physician (General Surgery) Magrinat, Virgie Dad, MD as Consulting Physician (Hematology and Oncology) Kyung Rudd, MD as Consulting Physician (Radiation Oncology) Luberta Mutter, MD as Consulting Physician (Ophthalmology) Mast, Man X, NP as Nurse Practitioner (Internal Medicine)  Extended Emergency Contact Information Primary Emergency Contact: [name],[name] Address: [address]          [place], [place] [address] [place] Phone: [phone] Work Phone: [phone] Relation: Daughter  Code Status:  Full Code Goals of care: Advanced Directive information Advanced Directives 07/17/2016  Does Patient Have a Medical Advance Directive? Yes  Type of Paramedic of [place];Living will  Does patient want to make changes to medical advance directive? -  Copy of [place] in Chart? Yes  Would patient like information on creating a medical advance directive? -  Pre-existing out of facility DNR order (yellow form or pink MOST form) -     Chief Complaint  Patient presents with  . Acute Visit    UTI/ follow-up on depression    HPI:  Pt is a 83 y.o. female seen today for an acute visit for UTI, 07/02/16 urine culture E. Coli >100,000c/ml, Augmentin 875mg  bid x 7days  Mood instability, the patient appears anxious, irritated, angry, and somehow suspicious since her Celexa 10mg  qd was discontinued. She states "I am going to kill myself" often when she is angry or frustrated, but she has no suicidal plans. She resides in AL and under around clock check. She stated she wound eat until her deceased husband came. Celexa 10mg  qd resumed.   Hx of dementia, not taking memory preserving  meds, resides in [place] for care assistance. HTN controlled on Amlodipine 5mg  qd. GERD, taking Zantac. Diarrhea,  on Levsin 0.125mg , Lomotil prn   Past Medical History:  Diagnosis Date  . Abnormal weight gain   . Abnormality of gait   . Acute upper respiratory infections of unspecified site   . Anemia, unspecified   . Appendicitis   . Arthritis   . Chronic kidney disease, stage III (moderate)   . Dementia in conditions classified elsewhere without behavioral disturbance   . Depression   . Diverticulitis   . Diverticulosis of colon (without mention of hemorrhage)   . Dizziness and giddiness   . Edema of leg   . GERD (gastroesophageal reflux disease)   . Glaucoma   . Hyperlipidemia LDL goal < 100   . Hypertension   . Irritable bowel syndrome   . Irritable bowel syndrome   . Leukocytopenia, unspecified   . Malignant neoplasm of breast (female), unspecified site   . Memory loss   . Other B-complex deficiencies   . Other malaise and fatigue   . Other specified symptom associated with female genital organs   . Personal history of fall   . Polymyalgia rheumatica ([place])   . Regional enteritis of unspecified site   . Sciatica   . Sciatica   . Sebaceous cyst   . Unspecified arthropathy, shoulder region   . Unspecified glaucoma(365.9)   . Unspecified vitamin D deficiency   . Urge incontinence   . Vertigo    Past Surgical History:  Procedure Laterality Date  . BREAST LUMPECTOMY  10/28/10   left  breast   . BREAST SURGERY     R breast lumpectomy for benign disease  . CATARACT EXTRACTION  1990  . CHOLECYSTECTOMY  1972  . NASAL SINUS SURGERY  1966    Allergies  Allergen Reactions  . Codeine     States she blacks out. Listed on [facility]    Outpatient Encounter Prescriptions as of 07/05/2016  Medication Sig  . acetaminophen (TYLENOL) 500 MG chewable tablet Chew 500 mg by mouth daily as needed for pain.   Marland Kitchen amLODipine (NORVASC) 5 MG tablet Take 5 mg by mouth daily.  . [EXPIRED]  amoxicillin-clavulanate (AUGMENTIN) 500-125 MG tablet Take 1 tablet by mouth 2 (two) times daily.  . bimatoprost (LUMIGAN) 0.01 % SOLN Place 1 drop into both eyes. One drop into both eyes at bedtime  . brimonidine-timolol (COMBIGAN) 0.2-0.5 % ophthalmic solution Place 1 drop into both eyes 2 (two) times daily.   . calcium carbonate (TUMS - DOSED IN MG ELEMENTAL CALCIUM) 500 MG chewable tablet Chew 1 tablet by mouth daily.  . chlorhexidine (PERIDEX) 0.12 % solution Use as directed 15 mLs in the mouth or throat at bedtime. One teaspoon to brush teeth at bedtime   . citalopram (CELEXA) 10 MG tablet Take 1 tablet (10 mg total) by mouth daily.  . diclofenac sodium (VOLTAREN) 1 % GEL Apply 2 g topically 3 (three) times daily.  . diphenoxylate-atropine (LOMOTIL) 2.5-0.025 MG tablet 2 tablets twice daily to prevent diarrhea  . lidocaine (LIDODERM) 5 % APPLY 1 PATCH TO EACH SHOULDER EVERY MORNING, KEEP ON FOR 12 HOURS AND THEN REMOVE FROM EACH SHOULDER AT BEDTIME  . Loratadine (CLARITIN) 10 MG CAPS Take 10 mg by mouth daily as needed (allergies).   . Multiple Vitamin (MULTIVITAMIN WITH MINERALS) TABS Take 1 tablet by mouth daily with breakfast.   . ranitidine (ZANTAC) 150 MG tablet Take 150 mg by mouth daily as needed for heartburn.   . [DISCONTINUED] dorzolamide (TRUSOPT) 2 % ophthalmic solution Place 1 drop into both eyes 2 (two) times daily. (Patient not taking: Reported on 07/17/2016)  . [DISCONTINUED] guaiFENesin-codeine (ROBAFEN AC) 100-10 MG/5ML syrup Take by mouth. 4 ml four times daily as needed for cough and con  . [DISCONTINUED] hyoscyamine (LEVSIN, ANASPAZ) 0.125 MG tablet 1 tablet before each meal to help relieve gas and abdominal discomfort (Patient not taking: Reported on 07/17/2016)  . [DISCONTINUED] QUEtiapine (SEROQUEL) 25 MG tablet Take 25 mg by mouth daily.  . [DISCONTINUED] risperiDONE (RISPERDAL) 0.25 MG tablet Take 0.25 mg by mouth daily as needed. For increased agitation  X 1week.  .  [DISCONTINUED] saccharomyces boulardii (FLORASTOR) 250 MG capsule Take 250 mg by mouth 2 (two) times daily.   No facility-administered encounter medications on file as of 07/05/2016.     Review of Systems  Constitutional: Positive for activity change, appetite change and fatigue. Negative for diaphoresis, fever and unexpected weight change.       Obese  HENT: Positive for hearing loss. Negative for congestion, dental problem, drooling, ear discharge, ear pain, facial swelling, mouth sores, nosebleeds, postnasal drip, rhinorrhea, sinus pain, sinus pressure, sneezing, sore throat, tinnitus, trouble swallowing and voice change.   Eyes: Negative for photophobia, pain, discharge, redness, itching and visual disturbance.  Respiratory: Positive for cough and shortness of breath (at rest). Negative for apnea, choking, chest tightness and wheezing.   Cardiovascular: Negative for chest pain, palpitations and leg swelling.  Gastrointestinal: Positive for diarrhea. Negative for abdominal distention, abdominal pain, anal bleeding, blood in stool, constipation, nausea,  rectal pain and vomiting.  Endocrine: Negative for cold intolerance, heat intolerance, polydipsia, polyphagia and polyuria.  Genitourinary: Negative for decreased urine volume, difficulty urinating, dysuria, flank pain, frequency, hematuria and urgency.       UTI 05/07/16 with E. Coli treated with Septra DS. CKD @  Musculoskeletal: Positive for arthralgias, back pain and gait problem (poor balance).       W/c for mobility. Hx plantar fasciitis.  Skin: Negative for color change, pallor, rash and wound.       Dark circle around her eyes.   Allergic/Immunologic: Positive for food allergies. Negative for environmental allergies and immunocompromised state.  Neurological: Negative for dizziness, tremors, seizures, syncope, facial asymmetry, speech difficulty, weakness, light-headedness and numbness.  Hematological: Negative for adenopathy. Does not  bruise/bleed easily.       Anemia  Psychiatric/Behavioral: Positive for confusion and suicidal ideas. Negative for agitation, behavioral problems, hallucinations, self-injury and sleep disturbance. The patient is nervous/anxious. The patient is not hyperactive.     Immunization History  Administered Date(s) Administered  . Influenza,inj,Quad PF,36+ Mos 11/07/2012, 10/03/2013  . Influenza-Unspecified 12/30/2009, 11/17/2010, 11/30/2011  . Pneumococcal Conjugate-13 01/05/2014  . Pneumococcal Polysaccharide-23 01/30/2006   Pertinent  Health Maintenance Due  Topic Date Due  . INFLUENZA VACCINE  08/30/2016  . DEXA SCAN  Completed  . PNA vac Low Risk Adult  Completed   Fall Risk  05/29/2016 01/05/2014 10/03/2013 09/05/2012 09/05/2012  Falls in the past year? Yes Yes Yes Yes Yes  Number falls in past yr: 2 or more 2 or more 2 or more - 2 or more  Injury with Fall? Yes - - - Yes  Risk Factor Category  High Fall Risk - High Fall Risk - High Fall Risk  Risk for fall due to : - - History of fall(s);Impaired balance/gait;Impaired mobility;Impaired vision;Mental status change - Impaired balance/gait;Impaired mobility   Functional Status Survey:    Vitals:   07/05/16 1051  BP: 128/80  Pulse: 76  Resp: 20  Temp: 97.9 F (36.6 C)  Weight: 195 lb 9.6 oz (88.7 kg)  Height: 5\' 5"  (1.651 m)   Body mass index is 32.55 kg/m. Physical Exam  Labs reviewed:  Recent Labs  09/12/15 1335 05/09/16 06/30/16 07/06/16 2025  NA 142 142 141 141  K 4.4 4.9 4.6 4.9  CL 109  --   --  108  CO2 27  --   --  27  GLUCOSE 87  --   --  114*  BUN 17 30* 22* 26*  CREATININE 1.22* 1.6* 1.2* 1.18*  CALCIUM 9.6  --   --  9.3    Recent Labs  05/09/16 06/30/16 07/06/16 2025  AST 12* 15 18  ALT 9 9 15   ALKPHOS 66 74 75  BILITOT  --   --  0.7  PROT  --   --  7.4  ALBUMIN  --   --  3.9    Recent Labs  09/12/15 1335  06/06/16 06/30/16 07/06/16 2025  WBC 3.8*  < > 4.0 4.4 4.1  NEUTROABS  --   --   --   --   2.6  HGB 10.9*  < > 10.2* 10.0* 10.5*  HCT 36.5  < > 32* 31* 33.7*  MCV 100.6*  --   --   --  99.1  PLT 166  < > 152 181 201  < > = values in this interval not displayed. Lab Results  Component Value Date   TSH 2.03 05/09/2016  Lab Results  Component Value Date   HGBA1C 5.4 06/04/2014   Lab Results  Component Value Date   CHOL 193 06/04/2014   HDL 49 06/04/2014   LDLCALC 124 06/04/2014   TRIG 102 06/04/2014   CHOLHDL 2.7 06/27/2012    Significant Diagnostic Results in last 30 days:  Dg Chest 2 View  Result Date: 07/06/2016 CLINICAL DATA:  Altered mental status EXAM: CHEST  2 VIEW COMPARISON:  09/12/2015 FINDINGS: The lungs are clear wiithout focal pneumonia, edema, pneumothorax or pleural effusion. Cardiopericardial silhouette is at upper limits of normal for size. The visualized bony structures of the thorax are intact. Telemetry leads overlie the chest. IMPRESSION: Stable.  No acute findings. Electronically Signed   By: Misty Stanley M.D.   On: 07/06/2016 19:34    Assessment/Plan UTI (urinary tract infection) 06/30/16 wbc 4.4, Hgb 10.0, plt 181, Na 141, K 4.6, Bun 22, creat 1.21 07/02/16 urine culture E. Coli >100,000c/ml, Augmentin 875mg  bid x 7days  Essential hypertension, benign Controlled, continue Amlodipine 5mg  qd  GERD (gastroesophageal reflux disease) Stable, continue Zantac.   Alzheimer's dementia Not on memory preserving meds, obtain MMSE   Depression, psychotic ([place]) Mood instability, the patient appears anxious, irritated, angry, and somehow suspicious since her Celexa 10mg  qd was discontinued. She states "I am going to kill myself" often when she is angry or frustrated, but she has no suicidal plans. She resides in [place] and under around clock check. She stated she wound eat until her deceased husband came. Celexa 10mg  qd resumed.  07/05/16 adding Seroquel 25mg  qd if POA consents  Acute renal failure superimposed on stage 3 chronic kidney disease ([place]) 06/30/16  Bun 22, creat 1.21  Anemia associated with chronic renal failure 06/30/16 Hgb 10.0  Diarrhea Managed with Levsin 0.125mg , prn Lomotil, 06/30/16 wbc 4.4, Hgb 10.0, plt 181, Na 141, K 4.6, Bun 22, creat 1.21     Family/ staff Communication: AL   Labs/tests ordered:  none  This encounter was created in error - please disregard.

## 2016-07-24 NOTE — Assessment & Plan Note (Signed)
Not on memory preserving meds, obtain MMSE

## 2016-07-24 NOTE — Progress Notes (Signed)
Location:  [facility] Room Number: 888 Place of Service:  ALF [phone]) Provider:  Archit Leger, Manxie   NP  Blanchie Serve, MD  Patient Care Team: Blanchie Serve, MD as PCP - General (Internal Medicine) Stark Klein, MD as Consulting Physician (General Surgery) Magrinat, Virgie Dad, MD as Consulting Physician (Hematology and Oncology) Kyung Rudd, MD as Consulting Physician (Radiation Oncology) Luberta Mutter, MD as Consulting Physician (Ophthalmology) Courtnie Brenes X, NP as Nurse Practitioner (Internal Medicine)  Extended Emergency Contact Information Primary Emergency Contact: [name],[name] Address: [address]          [place], [place] [address] [place] Phone: [phone] Work Phone: [phone] Relation: Daughter  Code Status:  Full Code Goals of care: Advanced Directive information Advanced Directives 07/17/2016  Does Patient Have a Medical Advance Directive? Yes  Type of Paramedic of [place];Living will  Does patient want to make changes to medical advance directive? -  Copy of [place] in Chart? Yes  Would patient like information on creating a medical advance directive? -  Pre-existing out of facility DNR order (yellow form or pink MOST form) -         Chief Complaint  Patient presents with  . Acute Visit    UTI/ follow-up on depression    HPI:  Pt is a 83 y.o. female seen today for an acute visit for UTI, 07/02/16 urine culture E. Coli >100,000c/ml, Augmentin 875mg  bid x 7days             Mood instability, the patient appears anxious, irritated, angry, and somehow suspicious since her Celexa 10mg  qd was discontinued. She states "I am going to kill myself" often when she is angry or frustrated, but she has no suicidal plans. She resides in AL and under around clock check. She stated she wound eat until her deceased husband came. Celexa 10mg  qd resumed.              Hx of  dementia, not taking memory preserving meds, resides in [place] for care assistance. HTN controlled on Amlodipine 5mg  qd. GERD, taking Zantac. Diarrhea, on Levsin 0.125mg , Lomotil prn       Past Medical History:  Diagnosis Date  . Abnormal weight gain   . Abnormality of gait   . Acute upper respiratory infections of unspecified site   . Anemia, unspecified   . Appendicitis   . Arthritis   . Chronic kidney disease, stage III (moderate)   . Dementia in conditions classified elsewhere without behavioral disturbance   . Depression   . Diverticulitis   . Diverticulosis of colon (without mention of hemorrhage)   . Dizziness and giddiness   . Edema of leg   . GERD (gastroesophageal reflux disease)   . Glaucoma   . Hyperlipidemia LDL goal < 100   . Hypertension   . Irritable bowel syndrome   . Irritable bowel syndrome   . Leukocytopenia, unspecified   . Malignant neoplasm of breast (female), unspecified site   . Memory loss   . Other B-complex deficiencies   . Other malaise and fatigue   . Other specified symptom associated with female genital organs   . Personal history of fall   . Polymyalgia rheumatica ([place])   . Regional enteritis of unspecified site   . Sciatica   . Sciatica   . Sebaceous cyst   . Unspecified arthropathy, shoulder region   . Unspecified glaucoma(365.9)   . Unspecified vitamin D  deficiency   . Urge incontinence   . Vertigo         Past Surgical History:  Procedure Laterality Date  . BREAST LUMPECTOMY  10/28/10   left breast   . BREAST SURGERY     R breast lumpectomy for benign disease  . CATARACT EXTRACTION  1990  . CHOLECYSTECTOMY  1972  . NASAL SINUS SURGERY  1966         Allergies  Allergen Reactions  . Codeine     States she blacks out. Listed on [facility]        Outpatient Encounter Prescriptions as of 07/05/2016  Medication Sig  . acetaminophen (TYLENOL) 500 MG chewable tablet Chew 500 mg by  mouth daily as needed for pain.   Marland Kitchen amLODipine (NORVASC) 5 MG tablet Take 5 mg by mouth daily.  . [EXPIRED] amoxicillin-clavulanate (AUGMENTIN) 500-125 MG tablet Take 1 tablet by mouth 2 (two) times daily.  . bimatoprost (LUMIGAN) 0.01 % SOLN Place 1 drop into both eyes. One drop into both eyes at bedtime  . brimonidine-timolol (COMBIGAN) 0.2-0.5 % ophthalmic solution Place 1 drop into both eyes 2 (two) times daily.   . calcium carbonate (TUMS - DOSED IN MG ELEMENTAL CALCIUM) 500 MG chewable tablet Chew 1 tablet by mouth daily.  . chlorhexidine (PERIDEX) 0.12 % solution Use as directed 15 mLs in the mouth or throat at bedtime. One teaspoon to brush teeth at bedtime   . citalopram (CELEXA) 10 MG tablet Take 1 tablet (10 mg total) by mouth daily.  . diclofenac sodium (VOLTAREN) 1 % GEL Apply 2 g topically 3 (three) times daily.  . diphenoxylate-atropine (LOMOTIL) 2.5-0.025 MG tablet 2 tablets twice daily to prevent diarrhea  . lidocaine (LIDODERM) 5 % APPLY 1 PATCH TO EACH SHOULDER EVERY MORNING, KEEP ON FOR 12 HOURS AND THEN REMOVE FROM EACH SHOULDER AT BEDTIME  . Loratadine (CLARITIN) 10 MG CAPS Take 10 mg by mouth daily as needed (allergies).   . Multiple Vitamin (MULTIVITAMIN WITH MINERALS) TABS Take 1 tablet by mouth daily with breakfast.   . ranitidine (ZANTAC) 150 MG tablet Take 150 mg by mouth daily as needed for heartburn.   . [DISCONTINUED] dorzolamide (TRUSOPT) 2 % ophthalmic solution Place 1 drop into both eyes 2 (two) times daily. (Patient not taking: Reported on 07/17/2016)  . [DISCONTINUED] guaiFENesin-codeine (ROBAFEN AC) 100-10 MG/5ML syrup Take by mouth. 4 ml four times daily as needed for cough and con  . [DISCONTINUED] hyoscyamine (LEVSIN, ANASPAZ) 0.125 MG tablet 1 tablet before each meal to help relieve gas and abdominal discomfort (Patient not taking: Reported on 07/17/2016)  . [DISCONTINUED] QUEtiapine (SEROQUEL) 25 MG tablet Take 25 mg by mouth daily.  . [DISCONTINUED]  risperiDONE (RISPERDAL) 0.25 MG tablet Take 0.25 mg by mouth daily as needed. For increased agitation  X 1week.  . [DISCONTINUED] saccharomyces boulardii (FLORASTOR) 250 MG capsule Take 250 mg by mouth 2 (two) times daily.   No facility-administered encounter medications on file as of 07/05/2016.     Review of Systems  Constitutional: Positive for activity change, appetite change and fatigue. Negative for diaphoresis, fever and unexpected weight change.       Obese  HENT: Positive for hearing loss. Negative for congestion, dental problem, drooling, ear discharge, ear pain, facial swelling, mouth sores, nosebleeds, postnasal drip, rhinorrhea, sinus pain, sinus pressure, sneezing, sore throat, tinnitus, trouble swallowing and voice change.   Eyes: Negative for photophobia, pain, discharge, redness, itching and visual disturbance.  Respiratory: Positive for cough  and shortness of breath (at rest). Negative for apnea, choking, chest tightness and wheezing.   Cardiovascular: Negative for chest pain, palpitations and leg swelling.  Gastrointestinal: Positive for diarrhea. Negative for abdominal distention, abdominal pain, anal bleeding, blood in stool, constipation, nausea, rectal pain and vomiting.  Endocrine: Negative for cold intolerance, heat intolerance, polydipsia, polyphagia and polyuria.  Genitourinary: Negative for decreased urine volume, difficulty urinating, dysuria, flank pain, frequency, hematuria and urgency.       UTI 05/07/16 with E. Coli treated with Septra DS. CKD @  Musculoskeletal: Positive for arthralgias, back pain and gait problem (poor balance).       W/c for mobility. Hx plantar fasciitis.  Skin: Negative for color change, pallor, rash and wound.       Dark circle around her eyes.   Allergic/Immunologic: Positive for food allergies. Negative for environmental allergies and immunocompromised state.  Neurological: Negative for dizziness, tremors, seizures, syncope, facial  asymmetry, speech difficulty, weakness, light-headedness and numbness.  Hematological: Negative for adenopathy. Does not bruise/bleed easily.       Anemia  Psychiatric/Behavioral: Positive for confusion and suicidal ideas. Negative for agitation, behavioral problems, hallucinations, self-injury and sleep disturbance. The patient is nervous/anxious. The patient is not hyperactive.         Immunization History  Administered Date(s) Administered  . Influenza,inj,Quad PF,36+ Mos 11/07/2012, 10/03/2013  . Influenza-Unspecified 12/30/2009, 11/17/2010, 11/30/2011  . Pneumococcal Conjugate-13 01/05/2014  . Pneumococcal Polysaccharide-23 01/30/2006       Pertinent  Health Maintenance Due  Topic Date Due  . INFLUENZA VACCINE  08/30/2016  . DEXA SCAN  Completed  . PNA vac Low Risk Adult  Completed   Fall Risk  05/29/2016 01/05/2014 10/03/2013 09/05/2012 09/05/2012  Falls in the past year? Yes Yes Yes Yes Yes  Number falls in past yr: 2 or more 2 or more 2 or more - 2 or more  Injury with Fall? Yes - - - Yes  Risk Factor Category  High Fall Risk - High Fall Risk - High Fall Risk  Risk for fall due to : - - History of fall(s);Impaired balance/gait;Impaired mobility;Impaired vision;Mental status change - Impaired balance/gait;Impaired mobility   Functional Status Survey:     Vitals:   07/05/16 1051  BP: 128/80  Pulse: 76  Resp: 20  Temp: 97.9 F (36.6 C)  Weight: 195 lb 9.6 oz (88.7 kg)  Height: 5\' 5"  (1.651 m)   Body mass index is 32.55 kg/m. Physical Exam  Labs reviewed:  RecentLabs(withinlast365days)   Recent Labs  09/12/15 1335 05/09/16 06/30/16 07/06/16 2025  NA 142 142 141 141  K 4.4 4.9 4.6 4.9  CL 109  --   --  108  CO2 27  --   --  27  GLUCOSE 87  --   --  114*  BUN 17 30* 22* 26*  CREATININE 1.22* 1.6* 1.2* 1.18*  CALCIUM 9.6  --   --  9.3      RecentLabs(withinlast365days)   Recent Labs  05/09/16 06/30/16 07/06/16 2025  AST 12* 15 18    ALT 9 9 15   ALKPHOS 66 74 75  BILITOT  --   --  0.7  PROT  --   --  7.4  ALBUMIN  --   --  3.9      RecentLabs(withinlast365days)   Recent Labs  09/12/15 1335  06/06/16 06/30/16 07/06/16 2025  WBC 3.8*  < > 4.0 4.4 4.1  NEUTROABS  --   --   --   --  2.6  HGB 10.9*  < > 10.2* 10.0* 10.5*  HCT 36.5  < > 32* 31* 33.7*  MCV 100.6*  --   --   --  99.1  PLT 166  < > 152 181 201  < > = values in this interval not displayed.   RecentLabs       Lab Results  Component Value Date   TSH 2.03 05/09/2016     RecentLabs       Lab Results  Component Value Date   HGBA1C 5.4 06/04/2014     RecentLabs       Lab Results  Component Value Date   CHOL 193 06/04/2014   HDL 49 06/04/2014   LDLCALC 124 06/04/2014   TRIG 102 06/04/2014   CHOLHDL 2.7 06/27/2012      Significant Diagnostic Results in last 30 days:   ImagingResults  Dg Chest 2 View  Result Date: 07/06/2016 CLINICAL DATA:  Altered mental status EXAM: CHEST  2 VIEW COMPARISON:  09/12/2015 FINDINGS: The lungs are clear wiithout focal pneumonia, edema, pneumothorax or pleural effusion. Cardiopericardial silhouette is at upper limits of normal for size. The visualized bony structures of the thorax are intact. Telemetry leads overlie the chest. IMPRESSION: Stable.  No acute findings. Electronically Signed   By: Misty Stanley M.D.   On: 07/06/2016 19:34     Assessment/Plan UTI (urinary tract infection) 06/30/16 wbc 4.4, Hgb 10.0, plt 181, Na 141, K 4.6, Bun 22, creat 1.21 07/02/16 urine culture E. Coli >100,000c/ml, Augmentin 875mg  bid x 7days  Essential hypertension, benign Controlled, continue Amlodipine 5mg  qd  GERD (gastroesophageal reflux disease) Stable, continue Zantac.   Alzheimer's dementia Not on memory preserving meds, obtain MMSE   Depression, psychotic ([place]) Mood instability, the patient appears anxious, irritated, angry, and somehow suspicious since her Celexa 10mg  qd was  discontinued. She states "I am going to kill myself" often when she is angry or frustrated, but she has no suicidal plans. She resides in AL and under around clock check. She stated she wound eat until her deceased husband came. Celexa 10mg  qd resumed.  07/05/16 adding Seroquel 25mg  qd if POA consents  Acute renal failure superimposed on stage 3 chronic kidney disease ([place]) 06/30/16 Bun 22, creat 1.21  Anemia associated with chronic renal failure 06/30/16 Hgb 10.0  Diarrhea Managed with Levsin 0.125mg , prn Lomotil, 06/30/16 wbc 4.4, Hgb 10.0, plt 181, Na 141, K 4.6, Bun 22, creat 1.21     Family/ staff Communication: AL   Labs/tests ordered:  none

## 2016-07-24 NOTE — Assessment & Plan Note (Signed)
Stable, continue Zantac.

## 2016-08-02 NOTE — Progress Notes (Signed)
ID: Michelle [name]   DOB: December 24, 1920  MR#: [id number]  MVH#:[id number]  PCP: Oneal Grout, MD GYN:  SU: Almond Lint OTHER MD: Dorothy Puffer   HISTORY OF PRESENT ILLNESS: From the original intake note:  Ms. Michelle [name] had routine screening mammography September 16, 2010.  There were new microcalcifications noted in the left breast and she was brought back for additional views on September 27, 2010.  Bilateral diagnostic mammography on that day showed a cluster of calcifications in the lower inner quadrant of the left breast measuring up to 2.9 cm.  There was a faint parenchymal density associated with these calcifications.  Accordingly, the patient proceeded to stereotactically guided vacuum-assisted biopsy of the left breast October 04, 2010.  The pathology from that procedure ([id number]) showed a ductal carcinoma in situ.  This appeared to be intermediate to high-grade and it was strongly estrogen receptor positive at 100%, weakly progesterone receptor positive at 6%. Her subsequent history is as detailed below  INTERVAL HISTORY: Michelle [name] returns today for follow-up of her noninvasive breast cancer accompanied by one of her caregivers, Michelle [name]. [facility] I have not seen her in 2 years. Recently she was found to be somewhat somnolent and to have some tremors which may have triggered this visit (I do not find a direct referral). She had some medication changes, as the cause seem to be polypharmacy.  She is here today for routine follow-up  REVIEW OF SYSTEMS: Michelle [name] is concerned about her eyes. She feels she is not seeing as well as she would like. She was not able to give me a spontaneous review of systems even in [place]. However she was alert, interactive, and very pleasant throughout the visit. There was no evidence of sleepiness or jerkiness. A detailed review of systems elicited no other complaints   PAST MEDICAL HISTORY: Past  Medical History:  Diagnosis Date  . Abnormal weight gain   . Abnormality of gait   . Acute upper respiratory infections of unspecified site   . Anemia, unspecified   . Appendicitis   . Arthritis   . Chronic kidney disease, stage III (moderate)   . Dementia in conditions classified elsewhere without behavioral disturbance   . Depression   . Diverticulitis   . Diverticulosis of colon (without mention of hemorrhage)   . Dizziness and giddiness   . Edema of leg   . GERD (gastroesophageal reflux disease)   . Glaucoma   . Hyperlipidemia LDL goal < 100   . Hypertension   . Irritable bowel syndrome   . Irritable bowel syndrome   . Leukocytopenia, unspecified   . Malignant neoplasm of breast (female), unspecified site   . Memory loss   . Other B-complex deficiencies   . Other malaise and fatigue   . Other specified symptom associated with female genital organs   . Personal history of fall   . Polymyalgia rheumatica ([facility])   . Regional enteritis of unspecified site   . Sciatica   . Sciatica   . Sebaceous cyst   . Unspecified arthropathy, shoulder region   . Unspecified glaucoma(365.9)   . Unspecified vitamin D deficiency   . Urge incontinence   . Vertigo   The patient has an extensive medical history including hypertension, hyperlipidemia, polymyalgia rheumatica, GERD, irritable bowel syndrome, diverticulosis, sciatica, hyperactive bladder, vertigo, phlebitis, glaucoma and remote appendicitis.  She has undergone sinus surgery, cataract surgery and is status post cholecystectomy.  PAST SURGICAL  HISTORY: Past Surgical History:  Procedure Laterality Date  . BREAST LUMPECTOMY  10/28/10   left breast   . BREAST SURGERY     R breast lumpectomy for benign disease  . CATARACT EXTRACTION  1990  . CHOLECYSTECTOMY  1972  . NASAL SINUS SURGERY  1966    FAMILY HISTORY Family History  Problem Relation Age of Onset  . Stroke Father   . Hypertension Mother   . Stroke Mother   . Breast  cancer Mother   . Heart attack Brother   . Cancer Brother   . Stroke Brother   . Heart attack Son   . Colon cancer Sister   The patient's father died from a stroke at the age of 63.  The patient's mother died at the age of 62 from breast cancer.  The patient had a brother who died at age 20 from a stroke and a sister who died at age 68 from colon cancer.  Another brother had Hodgkin's disease.  There were altogether 5 brothers and 5 sisters.  GYNECOLOGIC HISTORY: She had menarche age 73.  She is GX, P1, age at first live birth of 36.  She never took hormone replacement.  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   Her daughter, Michelle [name], present today, is Insurance risk surveyor at [employer] for [employer]  The patient's husband, Michelle [name], died many years ago.  Michelle [name] lives in a retirement home and, aside from breakfast, she does not cook any meals, no longer drives and mostly stays around the retirement home.  She does do some rehab exercises there. She at tends [facility] the [employer]   ADVANCED DIRECTIVES: Health care power of attorney and living will in place  HEALTH MAINTENANCE: Social History  Substance Use Topics  . Smoking status: Never Smoker  . Smokeless tobacco: Never Used  . Alcohol use No     Allergies  Allergen Reactions  . Codeine     States she blacks out. Listed on [facility]    Current Outpatient Prescriptions  Medication Sig Dispense Refill  . acetaminophen (TYLENOL) 500 MG chewable tablet Chew 500 mg by mouth daily as needed for pain.     Marland Kitchen acetaminophen (TYLENOL) 500 MG tablet Take 1,000 mg by mouth 3 (three) times daily. 8AM, 1PM, 6PM    . amLODipine (NORVASC) 5 MG tablet Take 5 mg by mouth daily.    . bimatoprost (LUMIGAN) 0.01 % SOLN Place 1 drop into both eyes. One drop into both eyes at bedtime    . brimonidine-timolol (COMBIGAN) 0.2-0.5 % ophthalmic solution Place 1 drop into  both eyes 2 (two) times daily.     . calcium carbonate (TUMS - DOSED IN MG ELEMENTAL CALCIUM) 500 MG chewable tablet Chew 1 tablet by mouth daily.    . chlorhexidine (PERIDEX) 0.12 % solution Use as directed 15 mLs in the mouth or throat at bedtime. One teaspoon to brush teeth at bedtime     . citalopram (CELEXA) 10 MG tablet Take 1 tablet (10 mg total) by mouth daily. 30 tablet 5  . diclofenac sodium (VOLTAREN) 1 % GEL Apply 2 g topically 3 (three) times daily.    . diphenoxylate-atropine (LOMOTIL) 2.5-0.025 MG tablet 2 tablets twice daily to prevent diarrhea 120 tablet 0  . dorzolamide (TRUSOPT) 2 % ophthalmic solution Place 2 drops into both eyes 2 (two) times daily.    Marland Kitchen guaiFENesin-codeine (ROBAFEN  AC) 100-10 MG/5ML syrup Take 4 mLs by mouth 4 (four) times daily as needed for cough or congestion.    . hyoscyamine (LEVSIN, ANASPAZ) 0.125 MG tablet Take 0.125 mg by mouth 3 (three) times daily with meals. Take at 8AM, 12PM, and 5PM to help relieve gas and abdominal discomfort.    . lidocaine (LIDODERM) 5 % APPLY 1 PATCH TO EACH SHOULDER EVERY MORNING, KEEP ON FOR 12 HOURS AND THEN REMOVE FROM EACH SHOULDER AT BEDTIME 60 patch 0  . loperamide (IMODIUM A-D) 2 MG tablet Take 2 mg by mouth as directed. Give 4mg  initial dose, then 2mg  after each loose stool x48 hours with a maximum of 16mg  in 24 hours. If diarrhea persists, notify MD.    . Loratadine (CLARITIN) 10 MG CAPS Take 10 mg by mouth daily as needed (allergies).     . Multiple Vitamin (MULTIVITAMIN WITH MINERALS) TABS Take 1 tablet by mouth daily with breakfast.     . ranitidine (ZANTAC) 150 MG tablet Take 150 mg by mouth daily as needed for heartburn.      No current facility-administered medications for this visit.     OBJECTIVE: Elderly [place] speaker examined in a wheelchair Vitals:   08/03/16 0946  BP: 103/84  Pulse: (!) 51  Resp: 18  Temp: 97.6 F (36.4 C)     There is no height or weight on file to calculate BMI.    ECOG FS:  2  Sclerae unicteric, pupils round, the left slightly smaller than the right Oropharynx clear and moist No cervical or supraclavicular adenopathy Lungs no rales or rhonchi Heart regular rate and rhythm Abd soft, obese, nontender, positive bowel sounds MSK mild kyphosis but no focal spinal tenderness Neuro: nonfocal, follows simple commands, appropriate and pleasant affect Breasts: The right breast is unremarkable. There are no suspicious masses and no skin or nipple changes of concern. The left breast is status post remote lumpectomy. There is no evidence of local recurrence. Both axillae are benign.   LAB RESULTS: Lab Results  Component Value Date   WBC 4.1 07/06/2016   NEUTROABS 2.6 07/06/2016   HGB 10.5 (L) 07/06/2016   HCT 33.7 (L) 07/06/2016   MCV 99.1 07/06/2016   PLT 201 07/06/2016      Chemistry      Component Value Date/Time   NA 141 07/06/2016 2025   NA 141 06/30/2016   NA 142 11/10/2013 1153   K 4.9 07/06/2016 2025   K 4.7 11/10/2013 1153   CL 108 07/06/2016 2025   CL 109 (H) 01/04/2012 1144   CO2 27 07/06/2016 2025   CO2 26 11/10/2013 1153   BUN 26 (H) 07/06/2016 2025   BUN 22 (A) 06/30/2016   BUN 24.1 11/10/2013 1153   CREATININE 1.18 (H) 07/06/2016 2025   CREATININE 1.2 (H) 11/10/2013 1153   GLU 87 06/30/2016      Component Value Date/Time   CALCIUM 9.3 07/06/2016 2025   CALCIUM 9.6 11/10/2013 1153   ALKPHOS 75 07/06/2016 2025   ALKPHOS 66 11/10/2013 1153   AST 18 07/06/2016 2025   AST 17 11/10/2013 1153   ALT 15 07/06/2016 2025   ALT 13 11/10/2013 1153   BILITOT 0.7 07/06/2016 2025   BILITOT 0.63 11/10/2013 1153       No results found for: LABCA2  No components found for: LABCA125  No results for input(s): INR in the last 168 hours.  Urinalysis    Component Value Date/Time   COLORURINE YELLOW 07/06/2016 2330  APPEARANCEUR CLEAR 07/06/2016 2330   LABSPEC 1.014 07/06/2016 2330   PHURINE 6.0 07/06/2016 2330   GLUCOSEU NEGATIVE  07/06/2016 2330   HGBUR NEGATIVE 07/06/2016 2330   BILIRUBINUR NEGATIVE 07/06/2016 2330   BILIRUBINUR Neg 12/16/2013 1650   KETONESUR NEGATIVE 07/06/2016 2330   PROTEINUR NEGATIVE 07/06/2016 2330   UROBILINOGEN 1.0 10/17/2014 0109   NITRITE NEGATIVE 07/06/2016 2330   LEUKOCYTESUR NEGATIVE 07/06/2016 2330    STUDIES: Dg Chest 2 View  Result Date: 07/06/2016 CLINICAL DATA:  Altered mental status EXAM: CHEST  2 VIEW COMPARISON:  09/12/2015 FINDINGS: The lungs are clear wiithout focal pneumonia, edema, pneumothorax or pleural effusion. Cardiopericardial silhouette is at upper limits of normal for size. The visualized bony structures of the thorax are intact. Telemetry leads overlie the chest. IMPRESSION: Stable.  No acute findings. Electronically Signed   By: Kennith Center M.D.   On: 07/06/2016 19:34     ASSESSMENT:  83 y.o. old [place] woman status post left lumpectomy October 28, 2010 for a 1.8 cm ductal carcinoma in situ, high-grade, estrogen and progesterone receptor positive at 100% and 6%, with negative margins. She did not receive radiation, but took letrozole for one year, stopping October 2013  PLAN:   Ree Kida leading is now 6 years out from definitive surgery for her noninvasive breast cancer. This is very favorable.  It is not otiose to remember that she had a noninvasive breast cancer. There is means the cancer cells are trapped inside the ducts were they started. This cancer therefore could not travel to a vital organ and could not take the patient's life.  In general in patients with easy to examine breasts who are over 90 I do not recommend yearly mammography. It is very uncomfortable for them. I feel Lenis Noon can be followed with breast exam alone and that is the plan  Accordingly I will see her again a year from now. It also reassures her that we don't see any evidence of active cancer  She will return to see me June of next year. Of course I will be glad to see her at  any time before that if the need arises.  Zienna Ahlin C    08/03/2016

## 2016-08-03 ENCOUNTER — Ambulatory Visit (HOSPITAL_BASED_OUTPATIENT_CLINIC_OR_DEPARTMENT_OTHER): Payer: Medicare Other | Admitting: Oncology

## 2016-08-03 VITALS — BP 103/84 | HR 51 | Temp 97.6°F | Resp 18 | Ht 65.0 in

## 2016-08-03 DIAGNOSIS — Z86000 Personal history of in-situ neoplasm of breast: Secondary | ICD-10-CM

## 2016-08-03 DIAGNOSIS — I10 Essential (primary) hypertension: Secondary | ICD-10-CM

## 2016-08-03 DIAGNOSIS — H538 Other visual disturbances: Secondary | ICD-10-CM | POA: Diagnosis not present

## 2016-08-03 DIAGNOSIS — D0512 Intraductal carcinoma in situ of left breast: Secondary | ICD-10-CM

## 2016-08-11 ENCOUNTER — Non-Acute Institutional Stay (SKILLED_NURSING_FACILITY): Payer: Medicare Other | Admitting: Nurse Practitioner

## 2016-08-11 ENCOUNTER — Encounter: Payer: Self-pay | Admitting: Nurse Practitioner

## 2016-08-11 DIAGNOSIS — F0280 Dementia in other diseases classified elsewhere without behavioral disturbance: Secondary | ICD-10-CM

## 2016-08-11 DIAGNOSIS — F323 Major depressive disorder, single episode, severe with psychotic features: Secondary | ICD-10-CM | POA: Diagnosis not present

## 2016-08-11 DIAGNOSIS — M545 Low back pain, unspecified: Secondary | ICD-10-CM

## 2016-08-11 DIAGNOSIS — G309 Alzheimer's disease, unspecified: Secondary | ICD-10-CM | POA: Diagnosis not present

## 2016-08-11 DIAGNOSIS — I10 Essential (primary) hypertension: Secondary | ICD-10-CM

## 2016-08-11 DIAGNOSIS — G8929 Other chronic pain: Secondary | ICD-10-CM

## 2016-08-11 DIAGNOSIS — W19XXXD Unspecified fall, subsequent encounter: Secondary | ICD-10-CM

## 2016-08-11 NOTE — Assessment & Plan Note (Signed)
Not on memory preserving meds, SNF for care assistance.

## 2016-08-11 NOTE — Assessment & Plan Note (Signed)
Managed with Tylenol 1000mg  tid and w/c for mobility.

## 2016-08-11 NOTE — Assessment & Plan Note (Signed)
Stable since 07/07/16 off Seroquel, continued Celexa 10mg , observe the patient.

## 2016-08-11 NOTE — Assessment & Plan Note (Signed)
08/11/16 fall, apparently from bed, the patient has no recollection of the event, no injury noted, her lack of safety awareness related to her dementia is contributory, close supervision needed for safety.

## 2016-08-11 NOTE — Assessment & Plan Note (Signed)
Controlled, continue Amlodipine 5mg  qd

## 2016-08-11 NOTE — Progress Notes (Signed)
Location:  [facility] Room Number: 15 Place of Service:  SNF (31) Provider:  Alvey Brockel, Manxie   NP  Blanchie Serve, MD  Patient Care Team: Blanchie Serve, MD as PCP - General (Internal Medicine) Stark Klein, MD as Consulting Physician (General Surgery) Magrinat, Virgie Dad, MD as Consulting Physician (Hematology and Oncology) Kyung Rudd, MD as Consulting Physician (Radiation Oncology) Luberta Mutter, MD as Consulting Physician (Ophthalmology) Wannetta Langland X, NP as Nurse Practitioner (Internal Medicine)  Extended Emergency Contact Information Primary Emergency Contact: [name],[name] Address: [address]          [place], [place] [address] [place] Phone: [phone] Work Phone: [phone] Relation: Daughter  Code Status:  Full Code Goals of care: Advanced Directive information Advanced Directives 08/11/2016  Does Patient Have a Medical Advance Directive? Yes  Type of Paramedic of [place];Living will  Does patient want to make changes to medical advance directive? No - Patient declined  Copy of [place] in Chart? Yes  Would patient like information on creating a medical advance directive? -  Pre-existing out of facility DNR order (yellow form or pink MOST form) Yellow form placed in chart (order not valid for inpatient use)     Chief Complaint  Patient presents with  . Acute Visit    Fall, increased confusion,    HPI:  Pt is a 83 y.o. female seen today for an acute visit for fell 08/11/16, she was noted on the floor in a sitting position beside of her w/c wrapped in her covers from bed, no apparent injury, she denied pain, no recollection of her falling.    Hx of dementia, not taking memory preserving meds, her mood is managed with Celexa 10mg  qd,  HTN controlled on Amlodipine 5mg  qd. GERD, taking Zantac. Diarrhea,  on Levsin 0.125mg  tid prn, Lomotil bid, she takes Tylenol 1000mg  tid for  osteoarthritic pain.   Past Medical History:  Diagnosis Date  . Abnormal weight gain   . Abnormality of gait   . Acute upper respiratory infections of unspecified site   . Anemia, unspecified   . Appendicitis   . Arthritis   . Chronic kidney disease, stage III (moderate)   . Dementia in conditions classified elsewhere without behavioral disturbance   . Depression   . Diverticulitis   . Diverticulosis of colon (without mention of hemorrhage)   . Dizziness and giddiness   . Edema of leg   . GERD (gastroesophageal reflux disease)   . Glaucoma   . Hyperlipidemia LDL goal < 100   . Hypertension   . Irritable bowel syndrome   . Irritable bowel syndrome   . Leukocytopenia, unspecified   . Malignant neoplasm of breast (female), unspecified site   . Memory loss   . Other B-complex deficiencies   . Other malaise and fatigue   . Other specified symptom associated with female genital organs   . Personal history of fall   . Polymyalgia rheumatica ([place])   . Regional enteritis of unspecified site   . Sciatica   . Sciatica   . Sebaceous cyst   . Unspecified arthropathy, shoulder region   . Unspecified glaucoma(365.9)   . Unspecified vitamin D deficiency   . Urge incontinence   . Vertigo     Past Medical History:  Diagnosis Date  . Abnormal weight gain   . Abnormality of gait   . Acute upper respiratory infections of unspecified site   . Anemia,  unspecified   . Appendicitis   . Arthritis   . Chronic kidney disease, stage III (moderate)   . Dementia in conditions classified elsewhere without behavioral disturbance   . Depression   . Diverticulitis   . Diverticulosis of colon (without mention of hemorrhage)   . Dizziness and giddiness   . Edema of leg   . GERD (gastroesophageal reflux disease)   . Glaucoma   . Hyperlipidemia LDL goal < 100   . Hypertension   . Irritable bowel syndrome   . Irritable bowel syndrome   . Leukocytopenia, unspecified   . Malignant neoplasm of  breast (female), unspecified site   . Memory loss   . Other B-complex deficiencies   . Other malaise and fatigue   . Other specified symptom associated with female genital organs   . Personal history of fall   . Polymyalgia rheumatica ([place])   . Regional enteritis of unspecified site   . Sciatica   . Sciatica   . Sebaceous cyst   . Unspecified arthropathy, shoulder region   . Unspecified glaucoma(365.9)   . Unspecified vitamin D deficiency   . Urge incontinence   . Vertigo    Past Surgical History:  Procedure Laterality Date  . BREAST LUMPECTOMY  10/28/10   left breast   . BREAST SURGERY     R breast lumpectomy for benign disease  . CATARACT EXTRACTION  1990  . CHOLECYSTECTOMY  1972  . NASAL SINUS SURGERY  1966    Allergies  Allergen Reactions  . Codeine     States she blacks out. Listed on [facility]    Outpatient Encounter Prescriptions as of 08/11/2016  Medication Sig  . acetaminophen (TYLENOL) 500 MG chewable tablet Chew 500 mg by mouth daily as needed for pain.   Marland Kitchen acetaminophen (TYLENOL) 500 MG tablet Take 1,000 mg by mouth 3 (three) times daily. 8AM, 1PM, 6PM  . amLODipine (NORVASC) 5 MG tablet Take 5 mg by mouth daily.  . bimatoprost (LUMIGAN) 0.01 % SOLN Place 1 drop into both eyes. One drop into both eyes at bedtime  . brimonidine-timolol (COMBIGAN) 0.2-0.5 % ophthalmic solution Place 1 drop into both eyes 2 (two) times daily.   . calcium carbonate (TUMS - DOSED IN MG ELEMENTAL CALCIUM) 500 MG chewable tablet Chew 1 tablet by mouth daily.  . chlorhexidine (PERIDEX) 0.12 % solution Use as directed 15 mLs in the mouth or throat at bedtime. One teaspoon to brush teeth at bedtime   . citalopram (CELEXA) 10 MG tablet Take 1 tablet (10 mg total) by mouth daily.  . diclofenac sodium (VOLTAREN) 1 % GEL Apply 2 g topically 3 (three) times daily.  . diphenoxylate-atropine (LOMOTIL) 2.5-0.025 MG tablet 2 tablets twice daily to prevent diarrhea  . dorzolamide (TRUSOPT) 2 %  ophthalmic solution Place 2 drops into both eyes 2 (two) times daily.  Marland Kitchen guaiFENesin-codeine (ROBAFEN AC) 100-10 MG/5ML syrup Take 4 mLs by mouth 4 (four) times daily as needed for cough or congestion.  . hyoscyamine (LEVSIN, ANASPAZ) 0.125 MG tablet Take 0.125 mg by mouth 3 (three) times daily with meals. Take at 8AM, 12PM, and 5PM to help relieve gas and abdominal discomfort.  . lidocaine (LIDODERM) 5 % APPLY 1 PATCH TO EACH SHOULDER EVERY MORNING, KEEP ON FOR 12 HOURS AND THEN REMOVE FROM EACH SHOULDER AT BEDTIME  . loperamide (IMODIUM A-D) 2 MG tablet Take 2 mg by mouth as directed. Give 4mg  initial dose, then 2mg  after each loose stool x48 hours with  a maximum of 16mg  in 24 hours. If diarrhea persists, notify MD.  . Loratadine (CLARITIN) 10 MG CAPS Take 10 mg by mouth daily as needed (allergies).   . Multiple Vitamin (MULTIVITAMIN WITH MINERALS) TABS Take 1 tablet by mouth daily with breakfast.   . ranitidine (ZANTAC) 150 MG tablet Take 150 mg by mouth daily as needed for heartburn.    No facility-administered encounter medications on file as of 08/11/2016.     Review of Systems  Constitutional: Positive for fatigue.       Obese  HENT: Positive for hearing loss. Negative for ear pain, sore throat, tinnitus, trouble swallowing and voice change.   Eyes: Negative for redness.  Respiratory: Negative for cough and shortness of breath (at rest).   Cardiovascular: Negative for chest pain, palpitations and leg swelling.  Gastrointestinal: Positive for diarrhea. Negative for vomiting.  Genitourinary: Positive for difficulty urinating. Negative for dysuria, frequency, hematuria and urgency.       UTI 05/07/16 with E. Coli treated with Septra DS. CKD @  Musculoskeletal: Positive for arthralgias, back pain and gait problem (poor balance).       W/c for mobility. Hx plantar fasciitis. Chronic lower back pain.   Skin: Negative for rash and wound.       Dark circle around her eyes.     Allergic/Immunologic: Negative for food allergies.  Neurological: Negative for syncope, speech difficulty and light-headedness.  Hematological:       Anemia, Hgb 10s  Psychiatric/Behavioral: Positive for confusion. Negative for agitation, behavioral problems, hallucinations, self-injury, sleep disturbance and suicidal ideas. The patient is not nervous/anxious and is not hyperactive.     Immunization History  Administered Date(s) Administered  . Influenza,inj,Quad PF,36+ Mos 11/07/2012, 10/03/2013  . Influenza-Unspecified 12/30/2009, 11/17/2010, 11/30/2011  . Pneumococcal Conjugate-13 01/05/2014  . Pneumococcal Polysaccharide-23 01/30/2006   Pertinent  Health Maintenance Due  Topic Date Due  . INFLUENZA VACCINE  08/30/2016  . DEXA SCAN  Completed  . PNA vac Low Risk Adult  Completed   Fall Risk  05/29/2016 01/05/2014 10/03/2013 09/05/2012 09/05/2012  Falls in the past year? Yes Yes Yes Yes Yes  Number falls in past yr: 2 or more 2 or more 2 or more - 2 or more  Injury with Fall? Yes - - - Yes  Risk Factor Category  High Fall Risk - High Fall Risk - High Fall Risk  Risk for fall due to : - - History of fall(s);Impaired balance/gait;Impaired mobility;Impaired vision;Mental status change - Impaired balance/gait;Impaired mobility   Functional Status Survey:    Vitals:   08/11/16 1346  BP: 120/70  Pulse: 68  Resp: 20  Temp: 98.3 F (36.8 C)  SpO2: 96%  Weight: 191 lb 11.2 oz (87 kg)  Height: 5\' 5"  (1.651 m)   Body mass index is 31.9 kg/m. Physical Exam  Constitutional: She appears well-developed and well-nourished. No distress.  Eyes: Pupils are equal, round, and reactive to light. Conjunctivae and EOM are normal. Right eye exhibits no discharge. Left eye exhibits no discharge.  Neck: Normal range of motion. Neck supple. No JVD present. No tracheal deviation present. No thyromegaly present.  Cardiovascular: Normal rate, regular rhythm, normal heart sounds and intact distal pulses.    Pulmonary/Chest: Breath sounds normal. No stridor. No respiratory distress. She has no wheezes. She has no rales.  Abdominal: Soft. Bowel sounds are normal. She exhibits no distension and no mass. There is no tenderness.  Musculoskeletal: Normal range of motion. She exhibits edema.  Difficulty  standing on her own, w/c for mobility. Trance edema in ankles.  Neurological: She is alert.  dementia  Skin: Skin is warm and dry.  Psychiatric:  Feel anxious at times    Labs reviewed:  Recent Labs  09/12/15 1335 05/09/16 06/30/16 07/06/16 2025  NA 142 142 141 141  K 4.4 4.9 4.6 4.9  CL 109  --   --  108  CO2 27  --   --  27  GLUCOSE 87  --   --  114*  BUN 17 30* 22* 26*  CREATININE 1.22* 1.6* 1.2* 1.18*  CALCIUM 9.6  --   --  9.3    Recent Labs  05/09/16 06/30/16 07/06/16 2025  AST 12* 15 18  ALT 9 9 15   ALKPHOS 66 74 75  BILITOT  --   --  0.7  PROT  --   --  7.4  ALBUMIN  --   --  3.9    Recent Labs  09/12/15 1335  06/06/16 06/30/16 07/06/16 2025  WBC 3.8*  < > 4.0 4.4 4.1  NEUTROABS  --   --   --   --  2.6  HGB 10.9*  < > 10.2* 10.0* 10.5*  HCT 36.5  < > 32* 31* 33.7*  MCV 100.6*  --   --   --  99.1  PLT 166  < > 152 181 201  < > = values in this interval not displayed. Lab Results  Component Value Date   TSH 2.03 05/09/2016   Lab Results  Component Value Date   HGBA1C 5.4 06/04/2014   Lab Results  Component Value Date   CHOL 193 06/04/2014   HDL 49 06/04/2014   LDLCALC 124 06/04/2014   TRIG 102 06/04/2014   CHOLHDL 2.7 06/27/2012    Significant Diagnostic Results in last 30 days:  No results found.  Assessment/Plan Fall 08/11/16 fall, apparently from bed, the patient has no recollection of the event, no injury noted, her lack of safety awareness related to her dementia is contributory, close supervision needed for safety.   Alzheimer's dementia Not on memory preserving meds, SNF for care assistance.   Essential hypertension, benign Controlled,  continue Amlodipine 5mg  qd  Depression, psychotic ([place]) Stable since 07/07/16 off Seroquel, continued Celexa 10mg , observe the patient.   Chronic midline low back pain without sciatica Managed with Tylenol 1000mg  tid and w/c for mobility.      Family/ staff Communication: AL   Labs/tests ordered:  none

## 2016-08-11 NOTE — Progress Notes (Signed)
Location:  [facility] Room Number: 15 Place of Service:  SNF (31) Provider:  Mast, Manxie  NP  Blanchie Serve, MD  Patient Care Team: Blanchie Serve, MD as PCP - General (Internal Medicine) Stark Klein, MD as Consulting Physician (General Surgery) Magrinat, Virgie Dad, MD as Consulting Physician (Hematology and Oncology) Kyung Rudd, MD as Consulting Physician (Radiation Oncology) Luberta Mutter, MD as Consulting Physician (Ophthalmology) Mast, Man X, NP as Nurse Practitioner (Internal Medicine)  Extended Emergency Contact Information Primary Emergency Contact: [name],[name] Address: [address]          [place], [place] [address] [place] Phone: [phone] Work Phone: [phone] Relation: Daughter  Code Status:  DNR Goals of care: Advanced Directive information Advanced Directives 08/11/2016  Does Patient Have a Medical Advance Directive? Yes  Type of Paramedic of [place];Living will  Does patient want to make changes to medical advance directive? No - Patient declined  Copy of [place] in Chart? Yes  Would patient like information on creating a medical advance directive? -  Pre-existing out of facility DNR order (yellow form or pink MOST form) Yellow form placed in chart (order not valid for inpatient use)     Chief Complaint  Patient presents with  . Acute Visit    Fall, increased confusion,    HPI:  Pt is a 83 y.o. female seen today for an acute visit for    Past Medical History:  Diagnosis Date  . Abnormal weight gain   . Abnormality of gait   . Acute upper respiratory infections of unspecified site   . Anemia, unspecified   . Appendicitis   . Arthritis   . Chronic kidney disease, stage III (moderate)   . Dementia in conditions classified elsewhere without behavioral disturbance   . Depression   . Diverticulitis   . Diverticulosis of colon (without mention of  hemorrhage)   . Dizziness and giddiness   . Edema of leg   . GERD (gastroesophageal reflux disease)   . Glaucoma   . Hyperlipidemia LDL goal < 100   . Hypertension   . Irritable bowel syndrome   . Irritable bowel syndrome   . Leukocytopenia, unspecified   . Malignant neoplasm of breast (female), unspecified site   . Memory loss   . Other B-complex deficiencies   . Other malaise and fatigue   . Other specified symptom associated with female genital organs   . Personal history of fall   . Polymyalgia rheumatica ([place])   . Regional enteritis of unspecified site   . Sciatica   . Sciatica   . Sebaceous cyst   . Unspecified arthropathy, shoulder region   . Unspecified glaucoma(365.9)   . Unspecified vitamin D deficiency   . Urge incontinence   . Vertigo    Past Surgical History:  Procedure Laterality Date  . BREAST LUMPECTOMY  10/28/10   left breast   . BREAST SURGERY     R breast lumpectomy for benign disease  . CATARACT EXTRACTION  1990  . CHOLECYSTECTOMY  1972  . NASAL SINUS SURGERY  1966    Allergies  Allergen Reactions  . Codeine     States she blacks out. Listed on [facility]    Outpatient Encounter Prescriptions as of 08/11/2016  Medication Sig  . acetaminophen (TYLENOL) 500 MG chewable tablet Chew 500 mg by mouth daily as needed for pain.   Marland Kitchen acetaminophen (TYLENOL) 500 MG tablet Take 1,000 mg  by mouth 3 (three) times daily. 8AM, 1PM, 6PM  . amLODipine (NORVASC) 5 MG tablet Take 5 mg by mouth daily.  . bimatoprost (LUMIGAN) 0.01 % SOLN Place 1 drop into both eyes. One drop into both eyes at bedtime  . brimonidine-timolol (COMBIGAN) 0.2-0.5 % ophthalmic solution Place 1 drop into both eyes 2 (two) times daily.   . calcium carbonate (TUMS - DOSED IN MG ELEMENTAL CALCIUM) 500 MG chewable tablet Chew 1 tablet by mouth daily.  . chlorhexidine (PERIDEX) 0.12 % solution Use as directed 15 mLs in the mouth or throat at bedtime. One teaspoon to brush teeth at bedtime   .  citalopram (CELEXA) 10 MG tablet Take 1 tablet (10 mg total) by mouth daily.  . diclofenac sodium (VOLTAREN) 1 % GEL Apply 2 g topically 3 (three) times daily.  . diphenoxylate-atropine (LOMOTIL) 2.5-0.025 MG tablet 2 tablets twice daily to prevent diarrhea  . dorzolamide (TRUSOPT) 2 % ophthalmic solution Place 2 drops into both eyes 2 (two) times daily.  Marland Kitchen guaiFENesin-codeine (ROBAFEN AC) 100-10 MG/5ML syrup Take 4 mLs by mouth 4 (four) times daily as needed for cough or congestion.  . hyoscyamine (LEVSIN, ANASPAZ) 0.125 MG tablet Take 0.125 mg by mouth 3 (three) times daily with meals. Take at 8AM, 12PM, and 5PM to help relieve gas and abdominal discomfort.  . lidocaine (LIDODERM) 5 % APPLY 1 PATCH TO EACH SHOULDER EVERY MORNING, KEEP ON FOR 12 HOURS AND THEN REMOVE FROM EACH SHOULDER AT BEDTIME  . loperamide (IMODIUM A-D) 2 MG tablet Take 2 mg by mouth as directed. Give 4mg  initial dose, then 2mg  after each loose stool x48 hours with a maximum of 16mg  in 24 hours. If diarrhea persists, notify MD.  . Loratadine (CLARITIN) 10 MG CAPS Take 10 mg by mouth daily as needed (allergies).   . Multiple Vitamin (MULTIVITAMIN WITH MINERALS) TABS Take 1 tablet by mouth daily with breakfast.   . ranitidine (ZANTAC) 150 MG tablet Take 150 mg by mouth daily as needed for heartburn.    No facility-administered encounter medications on file as of 08/11/2016.     Review of Systems  Immunization History  Administered Date(s) Administered  . Influenza,inj,Quad PF,36+ Mos 11/07/2012, 10/03/2013  . Influenza-Unspecified 12/30/2009, 11/17/2010, 11/30/2011  . Pneumococcal Conjugate-13 01/05/2014  . Pneumococcal Polysaccharide-23 01/30/2006   Pertinent  Health Maintenance Due  Topic Date Due  . INFLUENZA VACCINE  08/30/2016  . DEXA SCAN  Completed  . PNA vac Low Risk Adult  Completed   Fall Risk  05/29/2016 01/05/2014 10/03/2013 09/05/2012 09/05/2012  Falls in the past year? Yes Yes Yes Yes Yes  Number falls in past  yr: 2 or more 2 or more 2 or more - 2 or more  Injury with Fall? Yes - - - Yes  Risk Factor Category  High Fall Risk - High Fall Risk - High Fall Risk  Risk for fall due to : - - History of fall(s);Impaired balance/gait;Impaired mobility;Impaired vision;Mental status change - Impaired balance/gait;Impaired mobility   Functional Status Survey:    Vitals:   08/11/16 1346  BP: 120/70  Pulse: 68  Resp: 20  Temp: 98.3 F (36.8 C)  SpO2: 96%  Weight: 191 lb 11.2 oz (87 kg)  Height: 5\' 5"  (1.651 m)   Body mass index is 31.9 kg/m. Physical Exam  Labs reviewed:  Recent Labs  09/12/15 1335 05/09/16 06/30/16 07/06/16 2025  NA 142 142 141 141  K 4.4 4.9 4.6 4.9  CL 109  --   --  108  CO2 27  --   --  27  GLUCOSE 87  --   --  114*  BUN 17 30* 22* 26*  CREATININE 1.22* 1.6* 1.2* 1.18*  CALCIUM 9.6  --   --  9.3    Recent Labs  05/09/16 06/30/16 07/06/16 2025  AST 12* 15 18  ALT 9 9 15   ALKPHOS 66 74 75  BILITOT  --   --  0.7  PROT  --   --  7.4  ALBUMIN  --   --  3.9    Recent Labs  09/12/15 1335  06/06/16 06/30/16 07/06/16 2025  WBC 3.8*  < > 4.0 4.4 4.1  NEUTROABS  --   --   --   --  2.6  HGB 10.9*  < > 10.2* 10.0* 10.5*  HCT 36.5  < > 32* 31* 33.7*  MCV 100.6*  --   --   --  99.1  PLT 166  < > 152 181 201  < > = values in this interval not displayed. Lab Results  Component Value Date   TSH 2.03 05/09/2016   Lab Results  Component Value Date   HGBA1C 5.4 06/04/2014   Lab Results  Component Value Date   CHOL 193 06/04/2014   HDL 49 06/04/2014   LDLCALC 124 06/04/2014   TRIG 102 06/04/2014   CHOLHDL 2.7 06/27/2012    Significant Diagnostic Results in last 30 days:  No results found.  Assessment/Plan There are no diagnoses linked to this encounter.   Family/ staff Communication:   Labs/tests ordered:

## 2016-09-04 DIAGNOSIS — D649 Anemia, unspecified: Secondary | ICD-10-CM | POA: Diagnosis not present

## 2016-09-08 ENCOUNTER — Encounter: Payer: Self-pay | Admitting: Internal Medicine

## 2016-09-08 ENCOUNTER — Non-Acute Institutional Stay (SKILLED_NURSING_FACILITY): Payer: Medicare Other | Admitting: Internal Medicine

## 2016-09-08 DIAGNOSIS — J309 Allergic rhinitis, unspecified: Secondary | ICD-10-CM | POA: Diagnosis not present

## 2016-09-08 DIAGNOSIS — I10 Essential (primary) hypertension: Secondary | ICD-10-CM

## 2016-09-08 DIAGNOSIS — G8929 Other chronic pain: Secondary | ICD-10-CM

## 2016-09-08 DIAGNOSIS — K219 Gastro-esophageal reflux disease without esophagitis: Secondary | ICD-10-CM

## 2016-09-08 DIAGNOSIS — M545 Low back pain: Secondary | ICD-10-CM | POA: Diagnosis not present

## 2016-09-08 DIAGNOSIS — F0281 Dementia in other diseases classified elsewhere with behavioral disturbance: Secondary | ICD-10-CM

## 2016-09-08 DIAGNOSIS — G309 Alzheimer's disease, unspecified: Secondary | ICD-10-CM

## 2016-09-08 NOTE — Progress Notes (Addendum)
[facility]  Provider: Blanchie Serve MD   Location:  Central Room Number: 60-A Place of Service:  SNF (31)  PCP: Blanchie Serve, MD Patient Care Team: Blanchie Serve, MD as PCP - General (Internal Medicine) Stark Klein, MD as Consulting Physician (General Surgery) Magrinat, Virgie Dad, MD as Consulting Physician (Hematology and Oncology) Kyung Rudd, MD as Consulting Physician (Radiation Oncology) Luberta Mutter, MD as Consulting Physician (Ophthalmology) Mast, Man X, NP as Nurse Practitioner (Internal Medicine)  Extended Emergency Contact Information Primary Emergency Contact: [name],[name] Address: [address]          [place], [place] [address] Johnnette Litter of [place] Phone: [phone] Work Phone: [phone] Relation: Daughter  Code Status:  DNR Goals of Care: Advanced Directive information Advanced Directives 08/11/2016  Does Patient Have a Medical Advance Directive? Yes  Type of Paramedic of [place];Living will  Does patient want to make changes to medical advance directive? No - Patient declined  Copy of [place] in Chart? Yes  Would patient like information on creating a medical advance directive? -  Pre-existing out of facility DNR order (yellow form or pink MOST form) Yellow form placed in chart (order not valid for inpatient use)      Chief Complaint  Patient presents with  . Medical Management of Chronic Issues    Routine Visit     HPI: Patient is a 83 y.o. female seen today for routine visit. She tends to get more confused and has some agitation in the evening per nursing. She is pleasantly confused, has aphasia and is found mumbling to herself. She does not participate in  HPI and ROS. No fall reported since 08/29/16. Last fall on 08/29/16 and also on 08/11/16 on chart review. No apparent injury reported. She feeds herself. She takes her medication. No pressure  ulcer or wound reported.   Past Medical History:  Diagnosis Date  . Abnormal weight gain   . Abnormality of gait   . Acute upper respiratory infections of unspecified site   . Anemia, unspecified   . Appendicitis   . Arthritis   . Chronic kidney disease, stage III (moderate)   . Dementia in conditions classified elsewhere without behavioral disturbance   . Depression   . Diverticulitis   . Diverticulosis of colon (without mention of hemorrhage)   . Dizziness and giddiness   . Edema of leg   . GERD (gastroesophageal reflux disease)   . Glaucoma   . Hyperlipidemia LDL goal < 100   . Hypertension   . Irritable bowel syndrome   . Irritable bowel syndrome   . Leukocytopenia, unspecified   . Malignant neoplasm of breast (female), unspecified site   . Memory loss   . Other B-complex deficiencies   . Other malaise and fatigue   . Other specified symptom associated with female genital organs   . Personal history of fall   . Polymyalgia rheumatica ([place])   . Regional enteritis of unspecified site   . Sciatica   . Sciatica   . Sebaceous cyst   . Unspecified arthropathy, shoulder region   . Unspecified glaucoma(365.9)   . Unspecified vitamin D deficiency   . Urge incontinence   . Vertigo    Past Surgical History:  Procedure Laterality Date  . BREAST LUMPECTOMY  10/28/10   left breast   . BREAST SURGERY     R breast lumpectomy for benign disease  . CATARACT EXTRACTION  [place]  . NASAL SINUS SURGERY  1966    reports that she has never smoked. She has never used smokeless tobacco. She reports that she does not drink alcohol or use drugs. Social History   Social History  . Marital status: Widowed    Spouse name: N/A  . Number of children: N/A  . Years of education: N/A   Occupational History  . retired    Social History Main Topics  . Smoking status: Never Smoker  . Smokeless tobacco: Never Used  . Alcohol use No  . Drug use: No  . Sexual  activity: No   Other Topics Concern  . Not on file   Social History Narrative  . No narrative on file    Functional Status Survey:    Family History  Problem Relation Age of Onset  . Stroke Father   . Hypertension Mother   . Stroke Mother   . Breast cancer Mother   . Heart attack Brother   . Cancer Brother   . Stroke Brother   . Heart attack Son   . Colon cancer Sister     Health Maintenance  Topic Date Due  . INFLUENZA VACCINE  10/30/2016 (Originally 08/30/2016)  . TETANUS/TDAP  01/30/2017 (Originally 03/16/1939)  . DEXA SCAN  Completed  . PNA vac Low Risk Adult  Completed    Allergies  Allergen Reactions  . Codeine     States she blacks out. Listed on [facility]    Outpatient Encounter Prescriptions as of 09/08/2016  Medication Sig  . acetaminophen (TYLENOL) 500 MG tablet Take 1,000 mg by mouth 3 (three) times daily. 8AM, 1PM, 6PM  . amLODipine (NORVASC) 5 MG tablet Take 5 mg by mouth daily.  . bimatoprost (LUMIGAN) 0.01 % SOLN Place 1 drop into both eyes at bedtime.   . brimonidine-timolol (COMBIGAN) 0.2-0.5 % ophthalmic solution Place 1 drop into both eyes 2 (two) times daily.   . calcium carbonate (TUMS - DOSED IN MG ELEMENTAL CALCIUM) 500 MG chewable tablet Chew 1 tablet by mouth daily.  . chlorhexidine (PERIDEX) 0.12 % solution Use as directed 15 mLs in the mouth or throat at bedtime. One teaspoon to brush teeth at bedtime   . citalopram (CELEXA) 10 MG tablet Take 1 tablet (10 mg total) by mouth daily.  . diclofenac sodium (VOLTAREN) 1 % GEL Apply 2 g topically 3 (three) times daily.  . diphenoxylate-atropine (LOMOTIL) 2.5-0.025 MG tablet Take 1 tablet by mouth 2 (two) times daily.  . dorzolamide (TRUSOPT) 2 % ophthalmic solution Place 2 drops into both eyes 2 (two) times daily.  Marland Kitchen guaiFENesin-codeine (ROBAFEN AC) 100-10 MG/5ML syrup Take 4 mLs by mouth 4 (four) times daily as needed for cough or congestion.  . hyoscyamine (LEVSIN, ANASPAZ) 0.125 MG tablet Take 0.125  mg by mouth 3 (three) times daily as needed. Take at 8AM, 12PM, and 5PM to help relieve gas and abdominal discomfort.   . loperamide (IMODIUM A-D) 2 MG tablet Take 2 mg by mouth as directed. Give 4mg  initial dose, then 2mg  after each loose stool x48 hours with a maximum of 16mg  in 24 hours. If diarrhea persists, notify MD.  . Loratadine (CLARITIN) 10 MG CAPS Take 10 mg by mouth daily as needed (allergies).   . Multiple Vitamin (MULTIVITAMIN WITH MINERALS) TABS Take 1 tablet by mouth daily with breakfast.   . ranitidine (ZANTAC) 150 MG tablet Take 150 mg by mouth daily as needed for heartburn.   . [  DISCONTINUED] acetaminophen (TYLENOL) 500 MG chewable tablet Chew 500 mg by mouth daily as needed for pain.   . [DISCONTINUED] diphenoxylate-atropine (LOMOTIL) 2.5-0.025 MG tablet 2 tablets twice daily to prevent diarrhea  . [DISCONTINUED] lidocaine (LIDODERM) 5 % APPLY 1 PATCH TO EACH SHOULDER EVERY MORNING, KEEP ON FOR 12 HOURS AND THEN REMOVE FROM [facility] SHOULDER AT BEDTIME   No facility-administered encounter medications on file as of 09/08/2016.     Review of Systems  Reason unable to perform ROS: limited with her dementia and aphasia.  Constitutional: Negative for fever.  HENT: Negative for trouble swallowing.   Eyes: Positive for visual disturbance.       Has corrective glasses  Respiratory: Negative for shortness of breath.   Gastrointestinal: Negative for abdominal pain.  Musculoskeletal: Positive for gait problem.       Hoyer lift for transfer  Neurological: Positive for speech difficulty. Negative for tremors and seizures.  Hematological: Bruises/bleeds easily.  Psychiatric/Behavioral: Positive for behavioral problems and confusion.    Vitals:   09/08/16 1410  BP: 122/65  Pulse: 67  Resp: 18  Temp: 97.9 F (36.6 C)  TempSrc: Oral  SpO2: 95%  Weight: 191 lb 3.2 oz (86.7 kg)  Height: 5\' 5"  (1.651 m)   Body mass index is 31.82 kg/m.   Wt Readings from Last 3 Encounters:    09/08/16 191 lb 3.2 oz (86.7 kg)  08/11/16 191 lb 11.2 oz (87 kg)  07/18/16 195 lb (88.5 kg)    Physical Exam  Constitutional: She appears well-developed. No distress.  obese  HENT:  Head: Normocephalic and atraumatic.  Mouth/Throat: Oropharynx is clear and moist.  Eyes: Pupils are equal, round, and reactive to light. Conjunctivae and EOM are normal.  Neck: Normal range of motion. Neck supple. No thyromegaly present.  Cardiovascular: Normal rate and regular rhythm.   Pulmonary/Chest: Effort normal and breath sounds normal. She has no wheezes. She has no rales.  Abdominal: Soft. Bowel sounds are normal. There is no tenderness. There is no guarding.  Musculoskeletal: She exhibits edema and deformity.  Arthritis changes to fingers, trace leg edema, can move all 4 extremities, on wheelchair and needs assistance with transfer  Lymphadenopathy:    She has no cervical adenopathy.  Neurological: She is alert.  Skin: Skin is warm and dry. No rash noted. She is not diaphoretic.  Chronic stasis changes to her legs. Bruise to right leg  Psychiatric:  Pleasantly confused, calm this visit    Labs reviewed: Basic Metabolic Panel:  Recent Labs  09/12/15 1335  06/30/16 07/06/16 2025 07/20/16  NA 142  < > 141 141 139  K 4.4  < > 4.6 4.9 4.5  CL 109  --   --  108  --   CO2 27  --   --  27  --   GLUCOSE 87  --   --  114*  --   BUN 17  < > 22* 26* 23*  CREATININE 1.22*  < > 1.2* 1.18* 1.3*  CALCIUM 9.6  --   --  9.3  --   < > = values in this interval not displayed. Liver Function Tests:  Recent Labs  06/30/16 07/06/16 2025 07/20/16  AST 15 18 14   ALT 9 15 10   ALKPHOS 74 75 66  BILITOT  --  0.7  --   PROT  --  7.4  --   ALBUMIN  --  3.9  --    No results for input(s): LIPASE, AMYLASE  in the last 8760 hours. No results for input(s): AMMONIA in the last 8760 hours. CBC:  Recent Labs  09/12/15 1335  06/30/16 07/06/16 2025 07/20/16  WBC 3.8*  < > 4.4 4.1 4.9  NEUTROABS  --    --   --  2.6  --   HGB 10.9*  < > 10.0* 10.5* 9.9*  HCT 36.5  < > 31* 33.7* 30*  MCV 100.6*  --   --  99.1  --   PLT 166  < > 181 201 245  < > = values in this interval not displayed. Cardiac Enzymes: No results for input(s): CKTOTAL, CKMB, CKMBINDEX, TROPONINI in the last 8760 hours. BNP: Invalid input(s): POCBNP Lab Results  Component Value Date   HGBA1C 5.4 06/04/2014   Lab Results  Component Value Date   TSH 2.03 05/09/2016   No results found for: VITAMINB12 No results found for: FOLATE No results found for: IRON, TIBC, FERRITIN  Lipid Panel: No results for input(s): CHOL, HDL, LDLCALC, TRIG, CHOLHDL, LDLDIRECT in the last 8760 hours. Lab Results  Component Value Date   HGBA1C 5.4 06/04/2014    Procedures since last visit: No results found.  Assessment/Plan  Hypertension SBP all below 140 with one reading of 100/60. On review, taking amlodipine 5 mg daily. Has had frequent falls. D/c amlodipine for now and monitor BP reading. If readings are persistently >150/90, start antihypertensives.  gerd No heartburn complaints. Currently on prn ranitidine and has not asked for it. D/c rantidine order to reduce pill burden  Allergic rhinitis Appears stable, d/c prn claritin order  Chronic back pain Does not appear in distress. Limited mobility. Continue tylenol 1000 mg tid and monitor LFT periodically  Dementia with behavior disturbance Appears to have sundowning with change in behavior more towards evening. Currently on celexa 10 mg daily. Obtaining MMSE will be difficult with her aphasia. Will start her on donepezil 5 mg daily x 4 week and then 10 mg daily. Increase celexa to 20 mg daily and monitor    Labs/tests ordered:  none   I spent 35 minutes in total face-to-face time with the patient, more than 50% of which was spent in coordination of care, reviewing test results, reviewing medication and formulating care plan with patient's nurse.    Blanchie Serve,  MD Internal Medicine [facility] [address] [place], [place] [address] Cell Phone (Monday-Friday 8 am - 5 pm): [phone] On Call: [phone] and follow prompts after 5 pm and on weekends Office Phone: [phone] Office Fax: [phone]

## 2016-09-11 ENCOUNTER — Encounter: Payer: Self-pay | Admitting: Nurse Practitioner

## 2016-09-11 ENCOUNTER — Non-Acute Institutional Stay (SKILLED_NURSING_FACILITY): Payer: Medicare Other | Admitting: Nurse Practitioner

## 2016-09-11 DIAGNOSIS — F22 Delusional disorders: Secondary | ICD-10-CM | POA: Diagnosis not present

## 2016-09-11 DIAGNOSIS — F323 Major depressive disorder, single episode, severe with psychotic features: Secondary | ICD-10-CM

## 2016-09-11 DIAGNOSIS — F0280 Dementia in other diseases classified elsewhere without behavioral disturbance: Secondary | ICD-10-CM | POA: Diagnosis not present

## 2016-09-11 DIAGNOSIS — G301 Alzheimer's disease with late onset: Secondary | ICD-10-CM

## 2016-09-11 DIAGNOSIS — I10 Essential (primary) hypertension: Secondary | ICD-10-CM

## 2016-09-11 NOTE — Assessment & Plan Note (Addendum)
09/06/16 the patient was agitated at dinner, propelling self up and down the unit looking for her baby to breast feed the baby, told the nurse to shut her mouth when she was redirected, refused her meds, resolved until she was assisted to bed for night sleep. 09/07/16 wandering around facility attempting several times to exit the unit, combative when assisted with ADLs, meds refusal. 09/10/16 agitation, anxiety, refused dinner, aggressive with others at dinner table, 09/11/16 today: she stated she delivered a baby 4 days ago, feeling better now. 09/08/16 started Aricept 5mg  qd x 4 weeks, increased Citalopram 20mg  qd. Will continue to monitor patient's behaviors, will hold  Aricept if no better, update CBC CMP, Lorazepam 0.5mg  x1 at 4:12Pm due to onset of agitation.

## 2016-09-11 NOTE — Assessment & Plan Note (Signed)
Started Aricept 09/08/16, too soon to eval, no noted nausea, vomiting, , diarrhea, headache, insomnia, pain, dizziness, muscle cramps. Continue to monitor her behaviors of agitation, aggression, delusions, then re-eval.

## 2016-09-11 NOTE — Assessment & Plan Note (Signed)
Agitation, aggression, confusion, delusion, continued Celexa 20mg , observe the patient.

## 2016-09-11 NOTE — Assessment & Plan Note (Signed)
Permissive blood pressure control, 154/84, denied headache, change of vision, chest pain/pressure, palpitation. Continue to monitor BP

## 2016-09-11 NOTE — Progress Notes (Signed)
Location:  [facility] Room Number: 60-A Place of Service:  SNF (31) Provider:  Mast, Manxie  NP  Blanchie Serve, MD  Patient Care Team: Blanchie Serve, MD as PCP - General (Internal Medicine) Stark Klein, MD as Consulting Physician (General Surgery) Magrinat, Virgie Dad, MD as Consulting Physician (Hematology and Oncology) Kyung Rudd, MD as Consulting Physician (Radiation Oncology) Luberta Mutter, MD as Consulting Physician (Ophthalmology) Mast, Man X, NP as Nurse Practitioner (Internal Medicine)  Extended Emergency Contact Information Primary Emergency Contact: [name],[name] Address: [address]          [place],  [address] [place] Phone: [phone] Work Phone: [phone] Relation: Daughter  Code Status: DNR Goals of care: Advanced Directive information Advanced Directives 09/11/2016  Does Patient Have a Medical Advance Directive? Yes  Type of Paramedic of [place];Living will  Does patient want to make changes to medical advance directive? No - Patient declined  Copy of [place] in Chart? Yes  Would patient like information on creating a medical advance directive? -  Pre-existing out of facility DNR order (yellow form or pink MOST form) Yellow form placed in chart (order not valid for inpatient use)     Chief Complaint  Patient presents with  . Acute Visit    Behavioral issues    HPI:  Pt is a 83 y.o. female seen today for an acute visit for behavioral episodes, 09/06/16 the patient was agitated at dinner, propelling self up and down the unit looking for her baby to breast feed the baby, told the nurse to shut her mouth when she was redirected, refused her meds, resolved until she was assisted to bed for night sleep. 09/07/16 wandering around facility attempting several times to exit the unit, combative when assisted with ADLs, meds refusal. 09/10/16 agitation, anxiety,  refused dinner, aggressive with others at dinner table, 09/11/16 today: she stated she delivered a baby 4 days ago, feeling better now. 09/08/16 started Aricept 5mg  qd x 4 weeks, increased Citalopram 20mg  qd, dc'd Amlodipine. BP 154/84 today. She denied cough, chest pain, palpitation, abd pain, dysuria, or constipation, she is afebrile, no O2 desaturation.   At 4:00pm, the patient is agitated, talking loud, difficulty of redirecting, angry, denied pain, SOB, bathroom needs, or palpitation.   Past Medical History:  Diagnosis Date  . Abnormal weight gain   . Abnormality of gait   . Acute upper respiratory infections of unspecified site   . Anemia, unspecified   . Appendicitis   . Arthritis   . Chronic kidney disease, stage III (moderate)   . Dementia in conditions classified elsewhere without behavioral disturbance   . Depression   . Diverticulitis   . Diverticulosis of colon (without mention of hemorrhage)   . Dizziness and giddiness   . Edema of leg   . GERD (gastroesophageal reflux disease)   . Glaucoma   . Hyperlipidemia LDL goal < 100   . Hypertension   . Irritable bowel syndrome   . Irritable bowel syndrome   . Leukocytopenia, unspecified   . Malignant neoplasm of breast (female), unspecified site   . Memory loss   . Other B-complex deficiencies   . Other malaise and fatigue   . Other specified symptom associated with female genital organs   . Personal history of fall   . Polymyalgia rheumatica ([place])   . Regional enteritis of unspecified site   . Sciatica   . Sciatica   . Sebaceous  cyst   . Unspecified arthropathy, shoulder region   . Unspecified glaucoma(365.9)   . Unspecified vitamin D deficiency   . Urge incontinence   . Vertigo    Past Surgical History:  Procedure Laterality Date  . BREAST LUMPECTOMY  10/28/10   left breast   . BREAST SURGERY     R breast lumpectomy for benign disease  . CATARACT EXTRACTION  1990  . CHOLECYSTECTOMY  1972  . NASAL SINUS SURGERY   1966    Allergies  Allergen Reactions  . Codeine     States she blacks out. Listed on [facility]    Outpatient Encounter Prescriptions as of 09/11/2016  Medication Sig  . acetaminophen (TYLENOL) 500 MG tablet Take 1,000 mg by mouth 3 (three) times daily. 8AM, 1PM, 6PM  . bimatoprost (LUMIGAN) 0.01 % SOLN Place 1 drop into both eyes at bedtime.   . brimonidine-timolol (COMBIGAN) 0.2-0.5 % ophthalmic solution Place 1 drop into both eyes 2 (two) times daily.   . calcium carbonate (TUMS - DOSED IN MG ELEMENTAL CALCIUM) 500 MG chewable tablet Chew 1 tablet by mouth daily.  . chlorhexidine (PERIDEX) 0.12 % solution Use as directed 15 mLs in the mouth or throat at bedtime. One teaspoon to brush teeth at bedtime   . diclofenac sodium (VOLTAREN) 1 % GEL Apply 2 g topically 3 (three) times daily.  . diphenoxylate-atropine (LOMOTIL) 2.5-0.025 MG tablet Take 1 tablet by mouth 2 (two) times daily.  . dorzolamide (TRUSOPT) 2 % ophthalmic solution Place 2 drops into both eyes 2 (two) times daily.  Marland Kitchen guaiFENesin-codeine (ROBAFEN AC) 100-10 MG/5ML syrup Take 4 mLs by mouth 4 (four) times daily as needed for cough or congestion.  . hyoscyamine (LEVSIN, ANASPAZ) 0.125 MG tablet Take 0.125 mg by mouth 3 (three) times daily as needed. Take at 8AM, 12PM, and 5PM to help relieve gas and abdominal discomfort.   . loperamide (IMODIUM A-D) 2 MG tablet Take 2 mg by mouth as directed. Give 4mg  initial dose, then 2mg  after each loose stool x48 hours with a maximum of 16mg  in 24 hours. If diarrhea persists, notify MD.  . Multiple Vitamin (MULTIVITAMIN WITH MINERALS) TABS Take 1 tablet by mouth daily with breakfast.   . [DISCONTINUED] amLODipine (NORVASC) 5 MG tablet Take 5 mg by mouth daily.  . [DISCONTINUED] citalopram (CELEXA) 10 MG tablet Take 1 tablet (10 mg total) by mouth daily.  . [DISCONTINUED] Loratadine (CLARITIN) 10 MG CAPS Take 10 mg by mouth daily as needed (allergies).   . [DISCONTINUED] ranitidine (ZANTAC) 150 MG  tablet Take 150 mg by mouth daily as needed for heartburn.    No facility-administered encounter medications on file as of 09/11/2016.    ROS is provided by assistance of staff Review of Systems  Reason unable to perform ROS: limited with her dementia and aphasia.  Constitutional: Negative for activity change, appetite change, chills, diaphoresis, fatigue and fever.  HENT: Negative for congestion and trouble swallowing.   Eyes: Negative for visual disturbance.       Has corrective glasses  Respiratory: Negative for cough, choking and shortness of breath.   Cardiovascular: Negative for chest pain, palpitations and leg swelling.  Gastrointestinal: Negative for abdominal distention, abdominal pain, constipation, diarrhea, nausea and vomiting.  Genitourinary: Negative for difficulty urinating, dysuria and urgency.  Musculoskeletal: Positive for gait problem.       W/c for mobility  Skin: Negative for pallor and rash.  Neurological: Positive for speech difficulty. Negative for tremors, seizures and headaches.  Hematological: Bruises/bleeds easily.  Psychiatric/Behavioral: Positive for agitation, behavioral problems and confusion. Negative for sleep disturbance.       Delusional    Immunization History  Administered Date(s) Administered  . Influenza,inj,Quad PF,36+ Mos 11/07/2012, 10/03/2013  . Influenza-Unspecified 12/30/2009, 11/17/2010, 11/30/2011  . Pneumococcal Conjugate-13 01/05/2014  . Pneumococcal Polysaccharide-23 01/30/2006   Pertinent  Health Maintenance Due  Topic Date Due  . INFLUENZA VACCINE  10/30/2016 (Originally 08/30/2016)  . DEXA SCAN  Completed  . PNA vac Low Risk Adult  Completed   Fall Risk  05/29/2016 01/05/2014 10/03/2013 09/05/2012 09/05/2012  Falls in the past year? Yes Yes Yes Yes Yes  Number falls in past yr: 2 or more 2 or more 2 or more - 2 or more  Comment - - no injury per patient - -  Injury with Fall? Yes - - - Yes  Comment back pain - - - -  Risk Factor  Category  High Fall Risk - High Fall Risk - High Fall Risk  Risk for fall due to : - - History of fall(s);Impaired balance/gait;Impaired mobility;Impaired vision;Mental status change - Impaired balance/gait;Impaired mobility   Functional Status Survey:    Vitals:   09/11/16 1201  BP: (!) 154/84  Pulse: 74  Resp: 20  Temp: 98.9 F (37.2 C)  Weight: 191 lb 3.2 oz (86.7 kg)  Height: 5\' 5"  (1.651 m)   Body mass index is 31.82 kg/m. Physical Exam  Constitutional: She appears well-developed. No distress.  obese  HENT:  Head: Normocephalic and atraumatic.  Mouth/Throat: Oropharynx is clear and moist.  Eyes: Pupils are equal, round, and reactive to light. Conjunctivae and EOM are normal.  Neck: Normal range of motion. Neck supple. No thyromegaly present.  Cardiovascular: Normal rate and regular rhythm.   Pulmonary/Chest: Effort normal and breath sounds normal. She has no wheezes. She has no rales.  Abdominal: Soft. Bowel sounds are normal. She exhibits no distension. There is no tenderness.  Musculoskeletal: She exhibits no edema or deformity.  Lymphadenopathy:    She has cervical adenopathy.  Neurological: She is alert.  Oriented to self.   Skin: She is not diaphoretic.  Psychiatric:  Confusion, delusions upon my visit.     Labs reviewed:  Recent Labs  06/30/16 07/06/16 2025 07/20/16  NA 141 141 139  K 4.6 4.9 4.5  CL  --  108  --   CO2  --  27  --   GLUCOSE  --  114*  --   BUN 22* 26* 23*  CREATININE 1.2* 1.18* 1.3*  CALCIUM  --  9.3  --     Recent Labs  06/30/16 07/06/16 2025 07/20/16  AST 15 18 14   ALT 9 15 10   ALKPHOS 74 75 66  BILITOT  --  0.7  --   PROT  --  7.4  --   ALBUMIN  --  3.9  --     Recent Labs  06/30/16 07/06/16 2025 07/20/16  WBC 4.4 4.1 4.9  NEUTROABS  --  2.6  --   HGB 10.0* 10.5* 9.9*  HCT 31* 33.7* 30*  MCV  --  99.1  --   PLT 181 201 245   Lab Results  Component Value Date   TSH 2.03 05/09/2016   Lab Results  Component  Value Date   HGBA1C 5.4 06/04/2014   Lab Results  Component Value Date   CHOL 193 06/04/2014   HDL 49 06/04/2014   LDLCALC 124 06/04/2014   TRIG 102  06/04/2014   CHOLHDL 2.7 06/27/2012    Significant Diagnostic Results in last 30 days:  No results found.  Assessment/Plan Delusion ([facility]) 09/06/16 the patient was agitated at dinner, propelling self up and down the unit looking for her baby to breast feed the baby, told the nurse to shut her mouth when she was redirected, refused her meds, resolved until she was assisted to bed for night sleep. 09/07/16 wandering around facility attempting several times to exit the unit, combative when assisted with ADLs, meds refusal. 09/10/16 agitation, anxiety, refused dinner, aggressive with others at dinner table, 09/11/16 today: she stated she delivered a baby 4 days ago, feeling better now. 09/08/16 started Aricept 5mg  qd x 4 weeks, increased Citalopram 20mg  qd. Will continue to monitor patient's behaviors, will hold  Aricept if no better, update CBC CMP, Lorazepam 0.5mg  x1 at 4:12Pm due to onset of agitation.   Depression, psychotic ([facility]) Agitation, aggression, confusion, delusion, continued Celexa 20mg , observe the patient.   Alzheimer's dementia Started Aricept 09/08/16, too soon to eval, no noted nausea, vomiting, , diarrhea, headache, insomnia, pain, dizziness, muscle cramps. Continue to monitor her behaviors of agitation, aggression, delusions, then re-eval.   Essential hypertension, benign Permissive blood pressure control, 154/84, denied headache, change of vision, chest pain/pressure, palpitation. Continue to monitor BP     Family/ staff Communication: plan of care reviewed with the patient and charge nurse  Labs/tests ordered:  CBC CMP   Time spend 25 minutes

## 2016-09-12 ENCOUNTER — Other Ambulatory Visit: Payer: Self-pay | Admitting: *Deleted

## 2016-09-12 DIAGNOSIS — N649 Disorder of breast, unspecified: Secondary | ICD-10-CM | POA: Diagnosis not present

## 2016-09-12 LAB — BASIC METABOLIC PANEL
BUN: 22 — AB (ref 4–21)
Creatinine: 1.2 — AB (ref <=1.1)
Glucose: 93
POTASSIUM: 4.4 (ref 3.4–5.3)
Sodium: 145 (ref 137–147)

## 2016-09-12 LAB — CBC AND DIFFERENTIAL
HEMATOCRIT: 29 — AB (ref 36–46)
HEMOGLOBIN: 9.3 — AB (ref 12.0–16.0)
Neutrophils Absolute: 38
Platelets: 175 (ref 150–399)
WBC: 3

## 2016-09-12 LAB — HEPATIC FUNCTION PANEL
ALK PHOS: 58 (ref 25–125)
ALT: 10 (ref 7–35)
AST: 15 (ref 13–35)
Bilirubin, Total: 0.5

## 2016-09-19 ENCOUNTER — Non-Acute Institutional Stay: Payer: Self-pay | Admitting: Internal Medicine

## 2016-09-19 ENCOUNTER — Encounter: Payer: Self-pay | Admitting: Internal Medicine

## 2016-09-19 ENCOUNTER — Non-Acute Institutional Stay (SKILLED_NURSING_FACILITY): Payer: Medicare Other

## 2016-09-19 DIAGNOSIS — G301 Alzheimer's disease with late onset: Secondary | ICD-10-CM

## 2016-09-19 DIAGNOSIS — F29 Unspecified psychosis not due to a substance or known physiological condition: Secondary | ICD-10-CM

## 2016-09-19 DIAGNOSIS — F0281 Dementia in other diseases classified elsewhere with behavioral disturbance: Secondary | ICD-10-CM

## 2016-09-19 DIAGNOSIS — Z0000 Encounter for general adult medical examination without abnormal findings: Secondary | ICD-10-CM | POA: Diagnosis not present

## 2016-09-19 NOTE — Patient Instructions (Signed)
Michelle [name] , Thank you for taking time to come for your Medicare Wellness Visit. I appreciate your ongoing commitment to your health goals. Please review the following plan we discussed and let me know if I can assist you in the future.   Screening recommendations/referrals: Colonoscopy excluded pt over age 81 Mammogram excluded pt over age 13 Bone Density up to date Recommended yearly ophthalmology/optometry visit for glaucoma screening and checkup Recommended yearly dental visit for hygiene and checkup  Vaccinations: Influenza vaccine due 2018 fall season Pneumococcal vaccine up to date Tdap vaccine due Shingles vaccine not in records    Advanced directives: In Chart  Conditions/risks identified: None  Next appointment: Dr. Bubba Camp makes rounds   Preventive Care 53 Years and Older, Female Preventive care refers to lifestyle choices and visits with your health care provider that can promote health and wellness. What does preventive care include?  A yearly physical exam. This is also called an annual well check.  Dental exams once or twice a year.  Routine eye exams. Ask your health care provider how often you should have your eyes checked.  Personal lifestyle choices, including:  Daily care of your teeth and gums.  Regular physical activity.  Eating a healthy diet.  Avoiding tobacco and drug use.  Limiting alcohol use.  Practicing safe sex.  Taking low-dose aspirin every day.  Taking vitamin and mineral supplements as recommended by your health care provider. What happens during an annual well check? The services and screenings done by your health care provider during your annual well check will depend on your age, overall health, lifestyle risk factors, and family history of disease. Counseling  Your health care provider may ask you questions about your:  Alcohol use.  Tobacco use.  Drug use.  Emotional well-being.  Home and relationship  well-being.  Sexual activity.  Eating habits.  History of falls.  Memory and ability to understand (cognition).  Work and work Statistician.  Reproductive health. Screening  You may have the following tests or measurements:  Height, weight, and BMI.  Blood pressure.  Lipid and cholesterol levels. These may be checked every 5 years, or more frequently if you are over 43 years old.  Skin check.  Lung cancer screening. You may have this screening every year starting at age 32 if you have a 30-pack-year history of smoking and currently smoke or have quit within the past 15 years.  Fecal occult blood test (FOBT) of the stool. You may have this test every year starting at age 13.  Flexible sigmoidoscopy or colonoscopy. You may have a sigmoidoscopy every 5 years or a colonoscopy every 10 years starting at age 82.  Hepatitis C blood test.  Hepatitis B blood test.  Sexually transmitted disease (STD) testing.  Diabetes screening. This is done by checking your blood sugar (glucose) after you have not eaten for a while (fasting). You may have this done every 1-3 years.  Bone density scan. This is done to screen for osteoporosis. You may have this done starting at age 71.  Mammogram. This may be done every 1-2 years. Talk to your health care provider about how often you should have regular mammograms. Talk with your health care provider about your test results, treatment options, and if necessary, the need for more tests. Vaccines  Your health care provider may recommend certain vaccines, such as:  Influenza vaccine. This is recommended every year.  Tetanus, diphtheria, and acellular pertussis (Tdap, Td) vaccine. You may need a Td  booster every 10 years.  Zoster vaccine. You may need this after age 55.  Pneumococcal 13-valent conjugate (PCV13) vaccine. One dose is recommended after age 47.  Pneumococcal polysaccharide (PPSV23) vaccine. One dose is recommended after age  39. Talk to your health care provider about which screenings and vaccines you need and how often you need them. This information is not intended to replace advice given to you by your health care provider. Make sure you discuss any questions you have with your health care provider. Document Released: 02/12/2015 Document Revised: 10/06/2015 Document Reviewed: 11/17/2014 Elsevier Interactive Patient Education  2017 [employer] Prevention in the Home Falls can cause injuries. They can happen to people of all ages. There are many things you can do to make your home safe and to help prevent falls. What can I do on the outside of my home?  Regularly fix the edges of walkways and driveways and fix any cracks.  Remove anything that might make you trip as you walk through a door, such as a raised step or threshold.  Trim any bushes or trees on the path to your home.  Use bright outdoor lighting.  Clear any walking paths of anything that might make someone trip, such as rocks or tools.  Regularly check to see if handrails are loose or broken. Make sure that both sides of any steps have handrails.  Any raised decks and porches should have guardrails on the edges.  Have any leaves, snow, or ice cleared regularly.  Use sand or salt on walking paths during winter.  Clean up any spills in your garage right away. This includes oil or grease spills. What can I do in the bathroom?  Use night lights.  Install grab bars by the toilet and in the tub and shower. Do not use towel bars as grab bars.  Use non-skid mats or decals in the tub or shower.  If you need to sit down in the shower, use a plastic, non-slip stool.  Keep the floor dry. Clean up any water that spills on the floor as soon as it happens.  Remove soap buildup in the tub or shower regularly.  Attach bath mats securely with double-sided non-slip rug tape.  Do not have throw rugs and other things on the floor that can make  you trip. What can I do in the bedroom?  Use night lights.  Make sure that you have a light by your bed that is easy to reach.  Do not use any sheets or blankets that are too big for your bed. They should not hang down onto the floor.  Have a firm chair that has side arms. You can use this for support while you get dressed.  Do not have throw rugs and other things on the floor that can make you trip. What can I do in the kitchen?  Clean up any spills right away.  Avoid walking on wet floors.  Keep items that you use a lot in easy-to-reach places.  If you need to reach something above you, use a strong step stool that has a grab bar.  Keep electrical cords out of the way.  Do not use floor polish or wax that makes floors slippery. If you must use wax, use non-skid floor wax.  Do not have throw rugs and other things on the floor that can make you trip. What can I do with my stairs?  Do not leave any items on the stairs.  Make sure that there are handrails on both sides of the stairs and use them. Fix handrails that are broken or loose. Make sure that handrails are as long as the stairways.  Check any carpeting to make sure that it is firmly attached to the stairs. Fix any carpet that is loose or worn.  Avoid having throw rugs at the top or bottom of the stairs. If you do have throw rugs, attach them to the floor with carpet tape.  Make sure that you have a light switch at the top of the stairs and the bottom of the stairs. If you do not have them, ask someone to add them for you. What else can I do to help prevent falls?  Wear shoes that:  Do not have high heels.  Have rubber bottoms.  Are comfortable and fit you well.  Are closed at the toe. Do not wear sandals.  If you use a stepladder:  Make sure that it is fully opened. Do not climb a closed stepladder.  Make sure that both sides of the stepladder are locked into place.  Ask someone to hold it for you, if  possible.  Clearly mark and make sure that you can see:  Any grab bars or handrails.  First and last steps.  Where the edge of each step is.  Use tools that help you move around (mobility aids) if they are needed. These include:  Canes.  Walkers.  Scooters.  Crutches.  Turn on the lights when you go into a dark area. Replace any light bulbs as soon as they burn out.  Set up your furniture so you have a clear path. Avoid moving your furniture around.  If any of your floors are uneven, fix them.  If there are any pets around you, be aware of where they are.  Review your medicines with your doctor. Some medicines can make you feel dizzy. This can increase your chance of falling. Ask your doctor what other things that you can do to help prevent falls. This information is not intended to replace advice given to you by your health care provider. Make sure you discuss any questions you have with your health care provider. Document Released: 11/12/2008 Document Revised: 06/24/2015 Document Reviewed: 02/20/2014 Elsevier Interactive Patient Education  2017 [employer].

## 2016-09-19 NOTE — Progress Notes (Signed)
Subjective:   Michelle [name] is a 83 y.o. female who presents for an Initial Medicare Annual Wellness Visit at [facility]; incapacitated patient unable to answer questions appropriately        Objective:    Today's Vitals   09/19/16 1551  BP: 120/65  Pulse: (!) 53  Temp: 97.6 F (36.4 C)  TempSrc: Oral  SpO2: 92%  Weight: 191 lb (86.6 kg)  Height: 5\' 5"  (1.651 m)   Body mass index is 31.78 kg/m.   Current Medications (verified) Outpatient Encounter Prescriptions as of 09/19/2016  Medication Sig  . acetaminophen (TYLENOL) 500 MG tablet Take 1,000 mg by mouth 3 (three) times daily. 8AM, 1PM, 6PM  . bimatoprost (LUMIGAN) 0.01 % SOLN Place 1 drop into both eyes at bedtime.   . brimonidine-timolol (COMBIGAN) 0.2-0.5 % ophthalmic solution Place 1 drop into both eyes 2 (two) times daily.   . calcium carbonate (TUMS - DOSED IN MG ELEMENTAL CALCIUM) 500 MG chewable tablet Chew 1 tablet by mouth daily.  . chlorhexidine (PERIDEX) 0.12 % solution Use as directed 15 mLs in the mouth or throat at bedtime. One teaspoon to brush teeth at bedtime   . citalopram (CELEXA) 20 MG tablet Take 20 mg by mouth daily.  . diclofenac sodium (VOLTAREN) 1 % GEL Apply 2 g topically 3 (three) times daily.  . diphenoxylate-atropine (LOMOTIL) 2.5-0.025 MG tablet Take 1 tablet by mouth 2 (two) times daily.  Marland Kitchen donepezil (ARICEPT) 10 MG tablet Take 10 mg by mouth at bedtime. Start on 10/01/16  . donepezil (ARICEPT) 5 MG tablet Take 5 mg by mouth at bedtime. X 4 weeks then start Aricept 10 mg daily at bedtime  . dorzolamide (TRUSOPT) 2 % ophthalmic solution Place 2 drops into both eyes 2 (two) times daily.  Marland Kitchen guaiFENesin-codeine (ROBAFEN AC) 100-10 MG/5ML syrup Take 4 mLs by mouth 4 (four) times daily as needed for cough or congestion.  . hyoscyamine (LEVSIN, ANASPAZ) 0.125 MG tablet Take 0.125 mg by mouth 3 (three) times daily as needed. Take at 8AM, 12PM, and 5PM to help relieve gas and abdominal  discomfort.   . loperamide (IMODIUM A-D) 2 MG tablet Take 2 mg by mouth as directed. Give 4mg  initial dose, then 2mg  after each loose stool x48 hours with a maximum of 16mg  in 24 hours. If diarrhea persists, notify MD.  . Multiple Vitamin (MULTIVITAMIN WITH MINERALS) TABS Take 1 tablet by mouth daily with breakfast.    No facility-administered encounter medications on file as of 09/19/2016.     Allergies (verified) Codeine   History: Past Medical History:  Diagnosis Date  . Abnormal weight gain   . Abnormality of gait   . Acute upper respiratory infections of unspecified site   . Anemia, unspecified   . Appendicitis   . Arthritis   . Chronic kidney disease, stage III (moderate)   . Dementia in conditions classified elsewhere without behavioral disturbance   . Depression   . Diverticulitis   . Diverticulosis of colon (without mention of hemorrhage)   . Dizziness and giddiness   . Edema of leg   . GERD (gastroesophageal reflux disease)   . Glaucoma   . Hyperlipidemia LDL goal < 100   . Hypertension   . Irritable bowel syndrome   . Irritable bowel syndrome   . Leukocytopenia, unspecified   . Malignant neoplasm of breast (female), unspecified site   . Memory loss   . Other B-complex deficiencies   . Other malaise and  fatigue   . Other specified symptom associated with female genital organs   . Personal history of fall   . Polymyalgia rheumatica ([place])   . Regional enteritis of unspecified site   . Sciatica   . Sciatica   . Sebaceous cyst   . Unspecified arthropathy, shoulder region   . Unspecified glaucoma(365.9)   . Unspecified vitamin D deficiency   . Urge incontinence   . Vertigo    Past Surgical History:  Procedure Laterality Date  . BREAST LUMPECTOMY  10/28/10   left breast   . BREAST SURGERY     R breast lumpectomy for benign disease  . CATARACT EXTRACTION  1990  . CHOLECYSTECTOMY  1972  . NASAL SINUS SURGERY  1966   Family History  Problem Relation Age of  Onset  . Stroke Father   . Hypertension Mother   . Stroke Mother   . Breast cancer Mother   . Heart attack Brother   . Cancer Brother   . Stroke Brother   . Heart attack Son   . Colon cancer Sister    Social History   Occupational History  . retired    Social History Main Topics  . Smoking status: Never Smoker  . Smokeless tobacco: Never Used  . Alcohol use No  . Drug use: No  . Sexual activity: No    Tobacco Counseling Counseling given: Not Answered   Activities of Daily Living No flowsheet data found.  Immunizations and Health Maintenance Immunization History  Administered Date(s) Administered  . Influenza,inj,Quad PF,6+ Mos 11/07/2012, 10/03/2013  . Influenza-Unspecified 12/30/2009, 11/17/2010, 11/30/2011  . Pneumococcal Conjugate-13 01/05/2014  . Pneumococcal Polysaccharide-23 01/30/2006   There are no preventive care reminders to display for this patient.  Patient Care Team: Blanchie Serve, MD as PCP - General (Internal Medicine) Stark Klein, MD as Consulting Physician (General Surgery) Magrinat, Virgie Dad, MD as Consulting Physician (Hematology and Oncology) Kyung Rudd, MD as Consulting Physician (Radiation Oncology) Luberta Mutter, MD as Consulting Physician (Ophthalmology) Mast, Man X, NP as Nurse Practitioner (Internal Medicine)  Indicate any recent Medical Services you may have received from other than [facility] providers in the past year (date may be approximate).     Assessment:   This is a routine wellness examination for [place].   Hearing/Vision screen No exam data present  Dietary issues and exercise activities discussed:    Goals    None     Depression Screen PHQ 2/9 Scores 10/03/2013 09/05/2012 07/15/2012 05/23/2012  PHQ - 2 Score 0 0 0 0    Fall Risk Fall Risk  05/29/2016 01/05/2014 10/03/2013 09/05/2012 09/05/2012  Falls in the past year? Yes Yes Yes Yes Yes  Number falls in past yr: 2 or more 2 or more 2 or more - 2 or more  Comment -  - no injury per patient - -  Injury with Fall? Yes - - - Yes  Comment back pain - - - -  Risk Factor Category  High Fall Risk - High Fall Risk - High Fall Risk  Risk for fall due to : - - History of fall(s);Impaired balance/gait;Impaired mobility;Impaired vision;Mental status change - Impaired balance/gait;Impaired mobility    Cognitive Function: MMSE - Mini Mental State Exam 09/05/2012 05/23/2012  Orientation to time 4 4  Orientation to Place 5 4  Registration 3 3  Attention/ Calculation 4 0  Recall 2 2  Language- name 2 objects 2 2  Language- repeat 1 0  Language- follow 3 step command  3 3  Language- read & follow direction 1 1  Write a sentence 1 1  Copy design 1 0  Total score 27 20        Screening Tests Health Maintenance  Topic Date Due  . INFLUENZA VACCINE  10/30/2016 (Originally 08/30/2016)  . TETANUS/TDAP  01/30/2017 (Originally 03/16/1939)  . DEXA SCAN  Completed  . PNA vac Low Risk Adult  Completed      Plan:    I have personally reviewed and addressed the Medicare Annual Wellness questionnaire and have noted the following in the patient's chart:  A. Medical and social history B. Use of alcohol, tobacco or illicit drugs  C. Current medications and supplements D. Functional ability and status E.  Nutritional status F.  Physical activity G. Advance directives H. List of other physicians I.  Hospitalizations, surgeries, and ER visits in previous 12 months J.  [place] to include hearing, vision, cognitive, depression L. Referrals and appointments - none  In addition, I am unable to review and discuss with incapacitated patient certain preventive protocols, quality metrics, and best practice recommendations. A written personalized care plan for preventive services as well as general preventive health recommendations were provided to patient.   See attached scanned questionnaire for additional information.   Signed,   Rich Reining, RN Nurse  Health Advisor   Quick Notes   Health Maintenance: TDAP due     Abnormal Screen: MMSE 10/30. Did not pass clock drawing     Patient Concerns: None     Nurse Concerns: None

## 2016-09-19 NOTE — Progress Notes (Signed)
[facility]  Provider: Blanchie Serve MD   Location:  [place] Room Number: 60-A Place of Service:  SNF (31)  PCP: Blanchie Serve, MD Patient Care Team: Blanchie Serve, MD as PCP - General (Internal Medicine) Stark Klein, MD as Consulting Physician (General Surgery) Magrinat, Virgie Dad, MD as Consulting Physician (Hematology and Oncology) Kyung Rudd, MD as Consulting Physician (Radiation Oncology) Luberta Mutter, MD as Consulting Physician (Ophthalmology) Mast, Man X, NP as Nurse Practitioner (Internal Medicine)  Extended Emergency Contact Information Primary Emergency Contact: [name],[name] Address: [address]          [place], [place] [address] [address] Phone: [phone] Work Phone: [phone] Relation: Daughter  Code Status:  DNR  Goals of Care: Advanced Directive information Advanced Directives 09/11/2016  Does Patient Have a Medical Advance Directive? Yes  Type of Paramedic of [place];Living will  Does patient want to make changes to medical advance directive? No - Patient declined  Copy of [place] in Chart? Yes  Would patient like information on creating a medical advance directive? -  Pre-existing out of facility DNR order (yellow form or pink MOST form) Yellow form placed in chart (order not valid for inpatient use)     Chief Complaint  Patient presents with  . Acute Visit    Behavorial changes     HPI: Patient is a 83 y.o. female seen today for acute visit. She has been agitate and restless per nursing. She has had episodes where she has refused care and yelled at staff. Her behavior changes are noted in the evening. She is pleasantly confused throughout the day, talking mostly in [place] but appears to have agitation and restlessness with anxiety towards evening. Has refused care at times.    Past Medical History:  Diagnosis Date  . Abnormal  weight gain   . Abnormality of gait   . Acute upper respiratory infections of unspecified site   . Anemia, unspecified   . Appendicitis   . Arthritis   . Chronic kidney disease, stage III (moderate)   . Dementia in conditions classified elsewhere without behavioral disturbance   . Depression   . Diverticulitis   . Diverticulosis of colon (without mention of hemorrhage)   . Dizziness and giddiness   . Edema of leg   . GERD (gastroesophageal reflux disease)   . Glaucoma   . Hyperlipidemia LDL goal < 100   . Hypertension   . Irritable bowel syndrome   . Irritable bowel syndrome   . Leukocytopenia, unspecified   . Malignant neoplasm of breast (female), unspecified site   . Memory loss   . Other B-complex deficiencies   . Other malaise and fatigue   . Other specified symptom associated with female genital organs   . Personal history of fall   . Polymyalgia rheumatica ([place])   . Regional enteritis of unspecified site   . Sciatica   . Sciatica   . Sebaceous cyst   . Unspecified arthropathy, shoulder region   . Unspecified glaucoma(365.9)   . Unspecified vitamin D deficiency   . Urge incontinence   . Vertigo    Past Surgical History:  Procedure Laterality Date  . BREAST LUMPECTOMY  10/28/10   left breast   . BREAST SURGERY     R breast lumpectomy for benign disease  . CATARACT EXTRACTION  1990  . CHOLECYSTECTOMY  1972  . NASAL SINUS SURGERY  1966  reports that she has never smoked. She has never used smokeless tobacco. She reports that she does not drink alcohol or use drugs. Social History   Social History  . Marital status: Widowed    Spouse name: N/A  . Number of children: N/A  . Years of education: N/A   Occupational History  . retired    Social History Main Topics  . Smoking status: Never Smoker  . Smokeless tobacco: Never Used  . Alcohol use No  . Drug use: No  . Sexual activity: No   Other Topics Concern  . Not on file   Social History Narrative  .  No narrative on file    Functional Status Survey:    Family History  Problem Relation Age of Onset  . Stroke Father   . Hypertension Mother   . Stroke Mother   . Breast cancer Mother   . Heart attack Brother   . Cancer Brother   . Stroke Brother   . Heart attack Son   . Colon cancer Sister     Health Maintenance  Topic Date Due  . INFLUENZA VACCINE  10/30/2016 (Originally 08/30/2016)  . TETANUS/TDAP  01/30/2017 (Originally 03/16/1939)  . DEXA SCAN  Completed  . PNA vac Low Risk Adult  Completed    Allergies  Allergen Reactions  . Codeine     States she blacks out. Listed on [facility]    Outpatient Encounter Prescriptions as of 09/19/2016  Medication Sig  . acetaminophen (TYLENOL) 500 MG tablet Take 1,000 mg by mouth 3 (three) times daily. 8AM, 1PM, 6PM  . bimatoprost (LUMIGAN) 0.01 % SOLN Place 1 drop into both eyes at bedtime.   . brimonidine-timolol (COMBIGAN) 0.2-0.5 % ophthalmic solution Place 1 drop into both eyes 2 (two) times daily.   . calcium carbonate (TUMS - DOSED IN MG ELEMENTAL CALCIUM) 500 MG chewable tablet Chew 1 tablet by mouth daily.  . chlorhexidine (PERIDEX) 0.12 % solution Use as directed 15 mLs in the mouth or throat at bedtime. One teaspoon to brush teeth at bedtime   . citalopram (CELEXA) 20 MG tablet Take 20 mg by mouth daily.  . diclofenac sodium (VOLTAREN) 1 % GEL Apply 2 g topically 3 (three) times daily.  . diphenoxylate-atropine (LOMOTIL) 2.5-0.025 MG tablet Take 1 tablet by mouth 2 (two) times daily.  Marland Kitchen donepezil (ARICEPT) 10 MG tablet Take 10 mg by mouth at bedtime. Start on 10/01/16  . donepezil (ARICEPT) 5 MG tablet Take 5 mg by mouth at bedtime. X 4 weeks then start Aricept 10 mg daily at bedtime  . dorzolamide (TRUSOPT) 2 % ophthalmic solution Place 2 drops into both eyes 2 (two) times daily.  Marland Kitchen guaiFENesin-codeine (ROBAFEN AC) 100-10 MG/5ML syrup Take 4 mLs by mouth 4 (four) times daily as needed for cough or congestion.  . hyoscyamine (LEVSIN,  ANASPAZ) 0.125 MG tablet Take 0.125 mg by mouth 3 (three) times daily as needed. Take at 8AM, 12PM, and 5PM to help relieve gas and abdominal discomfort.   . loperamide (IMODIUM A-D) 2 MG tablet Take 2 mg by mouth as directed. Give 4mg  initial dose, then 2mg  after each loose stool x48 hours with a maximum of 16mg  in 24 hours. If diarrhea persists, notify MD.  . Multiple Vitamin (MULTIVITAMIN WITH MINERALS) TABS Take 1 tablet by mouth daily with breakfast.    No facility-administered encounter medications on file as of 09/19/2016.     Review of Systems  Reason unable to perform ROS: limited with  her dementia and aphasia.  Constitutional: Negative for fever.  HENT: Negative for trouble swallowing.   Eyes: Positive for visual disturbance.       Has corrective glasses  Respiratory: Negative for shortness of breath.   Gastrointestinal: Negative for abdominal pain.  Musculoskeletal: Positive for gait problem.       Hoyer lift for transfer  Neurological: Positive for speech difficulty. Negative for tremors and seizures.  Hematological: Bruises/bleeds easily.  Psychiatric/Behavioral: Positive for behavioral problems and confusion.    Vitals:   09/19/16 1433  BP: (!) 148/80  Pulse: 76  Resp: 20  Temp: 98.9 F (37.2 C)  TempSrc: Oral  SpO2: 95%  Weight: 191 lb 3.2 oz (86.7 kg)  Height: 5\' 5"  (1.651 m)   Body mass index is 31.82 kg/m.   Wt Readings from Last 3 Encounters:  09/19/16 191 lb 3.2 oz (86.7 kg)  09/11/16 191 lb 3.2 oz (86.7 kg)  09/08/16 191 lb 3.2 oz (86.7 kg)    Physical Exam  Constitutional: She appears well-developed. No distress.  obese  HENT:  Head: Normocephalic and atraumatic.  Mouth/Throat: Oropharynx is clear and moist.  Eyes: Pupils are equal, round, and reactive to light. EOM are normal.  Neck: Neck supple. No thyromegaly present.  Cardiovascular: Normal rate and regular rhythm.   Pulmonary/Chest: Effort normal and breath sounds normal. She has no  wheezes. She has no rales.  Abdominal: Soft. Bowel sounds are normal. There is no tenderness. There is no guarding.  Musculoskeletal: She exhibits edema and deformity.  Arthritis changes to fingers, trace leg edema, can move all 4 extremities, on wheelchair and needs assistance with transfer  Lymphadenopathy:    She has no cervical adenopathy.  Neurological: She is alert.  Skin: Skin is warm and dry. No rash noted. She is not diaphoretic.  Chronic stasis changes to her legs. Bruise to right leg  Psychiatric:  Pleasantly confused, calm this visit    Labs reviewed: Basic Metabolic Panel:  Recent Labs  07/06/16 2025 07/20/16 09/12/16  NA 141 139 145  K 4.9 4.5 4.4  CL 108  --   --   CO2 27  --   --   GLUCOSE 114*  --   --   BUN 26* 23* 22*  CREATININE 1.18* 1.3* 1.2*  CALCIUM 9.3  --   --    Liver Function Tests:  Recent Labs  07/06/16 2025 07/20/16 09/12/16  AST 18 14 15   ALT 15 10 10   ALKPHOS 75 66 58  BILITOT 0.7  --   --   PROT 7.4  --   --   ALBUMIN 3.9  --   --    No results for input(s): LIPASE, AMYLASE in the last 8760 hours. No results for input(s): AMMONIA in the last 8760 hours. CBC:  Recent Labs  07/06/16 2025 07/20/16 09/12/16  WBC 4.1 4.9 3.0  NEUTROABS 2.6  --  38  HGB 10.5* 9.9* 9.3*  HCT 33.7* 30* 29*  MCV 99.1  --   --   PLT 201 245 175   Cardiac Enzymes: No results for input(s): CKTOTAL, CKMB, CKMBINDEX, TROPONINI in the last 8760 hours. BNP: Invalid input(s): POCBNP Lab Results  Component Value Date   HGBA1C 5.4 06/04/2014   Lab Results  Component Value Date   TSH 2.03 05/09/2016   No results found for: VITAMINB12 No results found for: FOLATE No results found for: IRON, TIBC, FERRITIN  Lipid Panel: No results for input(s): CHOL, HDL, LDLCALC,  TRIG, CHOLHDL, LDLDIRECT in the last 8760 hours. Lab Results  Component Value Date   HGBA1C 5.4 06/04/2014    Procedures since last visit: No results  found.  Assessment/Plan  Unspecified psychosis With her progressive dementia, start seroquel 12.5 mg at 3 pm and monitor  Alzheimer's Dementia with behavior disturbance MMSE 10/30 and failed clock draw today. Currently on donepezil 5 mg daily and celexa 20 mg daily. Starting seroquel for her psychosis as above. Monitor clinically. Supportive care.    Labs/tests ordered:  none   I spent 30 minutes in total face-to-face time with the patient, more than 50% of which was spent in coordination of care, reviewing test results, reviewing medication and formulating care plan with patient's nurse.    Blanchie Serve, MD Internal Medicine [facility] [address] [place], [place] [address] Cell Phone (Monday-Friday 8 am - 5 pm): [phone] On Call: [phone] and follow prompts after 5 pm and on weekends Office Phone: [phone] Office Fax: [phone]

## 2016-09-29 ENCOUNTER — Non-Acute Institutional Stay (SKILLED_NURSING_FACILITY): Payer: Medicare Other | Admitting: Nurse Practitioner

## 2016-09-29 DIAGNOSIS — G301 Alzheimer's disease with late onset: Secondary | ICD-10-CM

## 2016-09-29 DIAGNOSIS — R4182 Altered mental status, unspecified: Secondary | ICD-10-CM | POA: Insufficient documentation

## 2016-09-29 DIAGNOSIS — F323 Major depressive disorder, single episode, severe with psychotic features: Secondary | ICD-10-CM | POA: Diagnosis not present

## 2016-09-29 DIAGNOSIS — Z0189 Encounter for other specified special examinations: Secondary | ICD-10-CM | POA: Diagnosis not present

## 2016-09-29 DIAGNOSIS — F0281 Dementia in other diseases classified elsewhere with behavioral disturbance: Secondary | ICD-10-CM | POA: Diagnosis not present

## 2016-09-29 DIAGNOSIS — F02818 Dementia in other diseases classified elsewhere, unspecified severity, with other behavioral disturbance: Secondary | ICD-10-CM

## 2016-09-29 DIAGNOSIS — R404 Transient alteration of awareness: Secondary | ICD-10-CM

## 2016-09-29 LAB — CBC AND DIFFERENTIAL
HCT: 30 — AB (ref 36–46)
Hemoglobin: 9.5 — AB (ref 12.0–16.0)
PLATELETS: 108 — AB (ref 150–399)
WBC: 2.7

## 2016-09-29 LAB — BASIC METABOLIC PANEL
BUN: 24 — AB (ref 4–21)
CREATININE: 1.2 — AB (ref 0.5–1.1)
GLUCOSE: 85
Potassium: 4.7 (ref 3.4–5.3)
Sodium: 141 (ref 137–147)

## 2016-09-29 NOTE — Assessment & Plan Note (Signed)
reported the patient has lethargic episodes 3-11 shift 09/28/16, obtained CBC BMP order from oncall provider. 09/29/16 Na 141, K 4.7, Bun 24, creat 1.21, wbc 10.9, Hgb 9.5, plt 108, neutrophils 39.8. She is in her usual state of mentation today upon my examination. She is afebrile, denied dysuria, lower abd/CVA pain, no hematuria noted, she wears adult depend for urinary incontinent care. She denied chest pain/palpitation, dizziness, or change of vision. No O2 desaturation. No focal neurological symptoms present. Continue to monitor the patient.

## 2016-09-29 NOTE — Progress Notes (Signed)
Location:  [facility] Room Number: 60-A Place of Service:  SNF (31) Provider:  Mast, Manxie  NP  Blanchie Serve, MD  Patient Care Team: Blanchie Serve, MD as PCP - General (Internal Medicine) Stark Klein, MD as Consulting Physician (General Surgery) Magrinat, Virgie Dad, MD as Consulting Physician (Hematology and Oncology) Kyung Rudd, MD as Consulting Physician (Radiation Oncology) Luberta Mutter, MD as Consulting Physician (Ophthalmology) Mast, Man X, NP as Nurse Practitioner (Internal Medicine)  Extended Emergency Contact Information Primary Emergency Contact: [name],[name] Address: [address]          [place], [place] [address] [place] Phone: [phone] Work Phone: [phone] Relation: Daughter  Code Status:  DNR Goals of care: Advanced Directive information Advanced Directives 09/29/2016  Does Patient Have a Medical Advance Directive? Yes  Type of Paramedic of [place];Living will;Out of facility DNR (pink MOST or yellow form)  Does patient want to make changes to medical advance directive? No - Patient declined  Copy of [place] in Chart? Yes  Would patient like information on creating a medical advance directive? -  Pre-existing out of facility DNR order (yellow form or pink MOST form) Yellow form placed in chart (order not valid for inpatient use)     Chief Complaint  Patient presents with  . Acute Visit    Lethargic    HPI:  Pt is a 83 y.o. female seen today for an acute visit for    Past Medical History:  Diagnosis Date  . Abnormal weight gain   . Abnormality of gait   . Acute upper respiratory infections of unspecified site   . Anemia, unspecified   . Appendicitis   . Arthritis   . Chronic kidney disease, stage III (moderate)   . Dementia in conditions classified elsewhere without behavioral disturbance   . Depression   . Diverticulitis   .  Diverticulosis of colon (without mention of hemorrhage)   . Dizziness and giddiness   . Edema of leg   . GERD (gastroesophageal reflux disease)   . Glaucoma   . Hyperlipidemia LDL goal < 100   . Hypertension   . Irritable bowel syndrome   . Irritable bowel syndrome   . Leukocytopenia, unspecified   . Malignant neoplasm of breast (female), unspecified site   . Memory loss   . Other B-complex deficiencies   . Other malaise and fatigue   . Other specified symptom associated with female genital organs   . Personal history of fall   . Polymyalgia rheumatica ([place])   . Regional enteritis of unspecified site   . Sciatica   . Sciatica   . Sebaceous cyst   . Unspecified arthropathy, shoulder region   . Unspecified glaucoma(365.9)   . Unspecified vitamin D deficiency   . Urge incontinence   . Vertigo    Past Surgical History:  Procedure Laterality Date  . BREAST LUMPECTOMY  10/28/10   left breast   . BREAST SURGERY     R breast lumpectomy for benign disease  . CATARACT EXTRACTION  1990  . CHOLECYSTECTOMY  1972  . NASAL SINUS SURGERY  1966    Allergies  Allergen Reactions  . Codeine     States she blacks out. Listed on [facility]    Outpatient Encounter Prescriptions as of 09/29/2016  Medication Sig  . acetaminophen (TYLENOL) 500 MG tablet Take 1,000 mg by mouth 3 (three) times daily. 8AM, 1PM, 6PM  . bimatoprost (LUMIGAN)  0.01 % SOLN Place 1 drop into both eyes at bedtime.   . brimonidine-timolol (COMBIGAN) 0.2-0.5 % ophthalmic solution Place 1 drop into both eyes 2 (two) times daily.   . calcium carbonate (TUMS - DOSED IN MG ELEMENTAL CALCIUM) 500 MG chewable tablet Chew 1 tablet by mouth daily.  . chlorhexidine (PERIDEX) 0.12 % solution Use as directed 15 mLs in the mouth or throat at bedtime. One teaspoon to brush teeth at bedtime   . citalopram (CELEXA) 20 MG tablet Take 20 mg by mouth daily.  . diclofenac sodium (VOLTAREN) 1 % GEL Apply 2 g topically 3 (three) times daily.  .  diphenoxylate-atropine (LOMOTIL) 2.5-0.025 MG tablet Take 1 tablet by mouth 2 (two) times daily.  Marland Kitchen donepezil (ARICEPT) 10 MG tablet Take 10 mg by mouth at bedtime. Start on 10/01/16  . donepezil (ARICEPT) 5 MG tablet Take 5 mg by mouth at bedtime. X 4 weeks then start Aricept 10 mg daily at bedtime  . dorzolamide (TRUSOPT) 2 % ophthalmic solution Place 2 drops into both eyes 2 (two) times daily.  Marland Kitchen guaiFENesin-codeine (ROBAFEN AC) 100-10 MG/5ML syrup Take 4 mLs by mouth 4 (four) times daily as needed for cough or congestion.  . hyoscyamine (LEVSIN, ANASPAZ) 0.125 MG tablet Take 0.125 mg by mouth 3 (three) times daily as needed. Take at 8AM, 12PM, and 5PM to help relieve gas and abdominal discomfort.   . loperamide (IMODIUM A-D) 2 MG tablet Take 2 mg by mouth as directed. Give 4mg  initial dose, then 2mg  after each loose stool x48 hours with a maximum of 16mg  in 24 hours. If diarrhea persists, notify MD.  . Multiple Vitamin (MULTIVITAMIN WITH MINERALS) TABS Take 1 tablet by mouth daily with breakfast.    No facility-administered encounter medications on file as of 09/29/2016.     Review of Systems  Immunization History  Administered Date(s) Administered  . Influenza,inj,Quad PF,6+ Mos 11/07/2012, 10/03/2013  . Influenza-Unspecified 12/30/2009, 11/17/2010, 11/30/2011  . Pneumococcal Conjugate-13 01/05/2014  . Pneumococcal Polysaccharide-23 01/30/2006   Pertinent  Health Maintenance Due  Topic Date Due  . INFLUENZA VACCINE  10/30/2016 (Originally 08/30/2016)  . DEXA SCAN  Completed  . PNA vac Low Risk Adult  Completed   Fall Risk  09/19/2016 05/29/2016 01/05/2014 10/03/2013 09/05/2012  Falls in the past year? Yes Yes Yes Yes Yes  Number falls in past yr: 1 2 or more 2 or more 2 or more -  Comment - - - no injury per patient -  Injury with Fall? No Yes - - -  Comment - back pain - - -  Risk Factor Category  - High Fall Risk - High Fall Risk -  Risk for fall due to : - - - History of  fall(s);Impaired balance/gait;Impaired mobility;Impaired vision;Mental status change -   Functional Status Survey:    Vitals:   09/29/16 1434  BP: (!) 148/100  Pulse: 68  Resp: 20  Temp: (!) 97.3 F (36.3 C)  Weight: 191 lb (86.6 kg)  Height: 5\' 5"  (1.651 m)   Body mass index is 31.78 kg/m. Physical Exam  Labs reviewed:  Recent Labs  07/06/16 2025 07/20/16 09/12/16  NA 141 139 145  K 4.9 4.5 4.4  CL 108  --   --   CO2 27  --   --   GLUCOSE 114*  --   --   BUN 26* 23* 22*  CREATININE 1.18* 1.3* 1.2*  CALCIUM 9.3  --   --  Recent Labs  07/06/16 2025 07/20/16 09/12/16  AST 18 14 15   ALT 15 10 10   ALKPHOS 75 66 58  BILITOT 0.7  --   --   PROT 7.4  --   --   ALBUMIN 3.9  --   --     Recent Labs  07/06/16 2025 07/20/16 09/12/16  WBC 4.1 4.9 3.0  NEUTROABS 2.6  --  38  HGB 10.5* 9.9* 9.3*  HCT 33.7* 30* 29*  MCV 99.1  --   --   PLT 201 245 175   Lab Results  Component Value Date   TSH 2.03 05/09/2016   Lab Results  Component Value Date   HGBA1C 5.4 06/04/2014   Lab Results  Component Value Date   CHOL 193 06/04/2014   HDL 49 06/04/2014   LDLCALC 124 06/04/2014   TRIG 102 06/04/2014   CHOLHDL 2.7 06/27/2012    Significant Diagnostic Results in last 30 days:  No results found.  Assessment/Plan 1. Transient alteration of awareness     Family/ staff Communication:   Labs/tests ordered:

## 2016-09-29 NOTE — Assessment & Plan Note (Signed)
Difficulty with memory recalls, continue SNF, continue Aricept.

## 2016-09-29 NOTE — Assessment & Plan Note (Signed)
Mood is stable, continue Citalopram 20mg  qd.

## 2016-09-29 NOTE — Progress Notes (Signed)
Location:   [facility] Room Number: 60-A Place of Service:  SNF (31) Provider: Lennie Odor Mast NP  Blanchie Serve, MD  Patient Care Team: Blanchie Serve, MD as PCP - General (Internal Medicine) Stark Klein, MD as Consulting Physician (General Surgery) Magrinat, Virgie Dad, MD as Consulting Physician (Hematology and Oncology) Kyung Rudd, MD as Consulting Physician (Radiation Oncology) Luberta Mutter, MD as Consulting Physician (Ophthalmology) Mast, Man X, NP as Nurse Practitioner (Internal Medicine)  Extended Emergency Contact Information Primary Emergency Contact: [name],[name] Address: [address]          [place], [place] [address] [place] Phone: [phone] Work Phone: [phone] Relation: Daughter  Code Status: DNR Goals of care: Advanced Directive information Advanced Directives 09/29/2016  Does Patient Have a Medical Advance Directive? Yes  Type of Paramedic of [place];Living will;Out of facility DNR (pink MOST or yellow form)  Does patient want to make changes to medical advance directive? No - Patient declined  Copy of [place] in Chart? Yes  Would patient like information on creating a medical advance directive? -  Pre-existing out of facility DNR order (yellow form or pink MOST form) Yellow form placed in chart (order not valid for inpatient use)     Chief Complaint  Patient presents with  . Acute Visit    Lethargic    HPI:  Pt is a 83 y.o. female seen today for an acute visit for reported the patient has lethargic episodes 3-11 shift 09/28/16, not sure how frequent or how long lasted each episode, no reported focal weakness or not associated with the episodes. Cannot determine if the patient was  tired or had TIA, she takes Citalopram 20mg  qd for mood,  obtained CBC BMP order from oncall provider. 09/29/16 Na 141, K 4.7, Bun 24, creat 1.21, wbc 10.9, Hgb 9.5, plt 108, neutrophils 39.8. She  is in her usual state of mentation today upon my examination. She is afebrile, denied dysuria, lower abd/CVA pain, no hematuria noted, she wears adult depend for urinary incontinent care. She denied chest pain/palpitation, dizziness, or change of vision. No O2 desaturation. No focal neurological symptoms present. The patient has dementia, resides in SNF, takes Aricept for memory   Past Medical History:  Diagnosis Date  . Abnormal weight gain   . Abnormality of gait   . Acute upper respiratory infections of unspecified site   . Anemia, unspecified   . Appendicitis   . Arthritis   . Chronic kidney disease, stage III (moderate)   . Dementia in conditions classified elsewhere without behavioral disturbance   . Depression   . Diverticulitis   . Diverticulosis of colon (without mention of hemorrhage)   . Dizziness and giddiness   . Edema of leg   . GERD (gastroesophageal reflux disease)   . Glaucoma   . Hyperlipidemia LDL goal < 100   . Hypertension   . Irritable bowel syndrome   . Irritable bowel syndrome   . Leukocytopenia, unspecified   . Malignant neoplasm of breast (female), unspecified site   . Memory loss   . Other B-complex deficiencies   . Other malaise and fatigue   . Other specified symptom associated with female genital organs   . Personal history of fall   . Polymyalgia rheumatica ([place])   . Regional enteritis of unspecified site   . Sciatica   . Sciatica   . Sebaceous cyst   . Unspecified arthropathy, shoulder region   . Unspecified  glaucoma(365.9)   . Unspecified vitamin D deficiency   . Urge incontinence   . Vertigo    Past Surgical History:  Procedure Laterality Date  . BREAST LUMPECTOMY  10/28/10   left breast   . BREAST SURGERY     R breast lumpectomy for benign disease  . CATARACT EXTRACTION  1990  . CHOLECYSTECTOMY  1972  . NASAL SINUS SURGERY  1966    Allergies  Allergen Reactions  . Codeine     States she blacks out. Listed on [facility]    Allergies  as of 09/29/2016      Reactions   Codeine    States she blacks out. Listed on [facility]      Medication List       Accurate as of 09/29/16  4:02 PM. Always use your most recent med list.          acetaminophen 500 MG tablet Commonly known as:  TYLENOL Take 1,000 mg by mouth 3 (three) times daily. 8AM, 1PM, 6PM   calcium carbonate 500 MG chewable tablet Commonly known as:  TUMS - dosed in mg elemental calcium Chew 1 tablet by mouth daily.   chlorhexidine 0.12 % solution Commonly known as:  PERIDEX Use as directed 15 mLs in the mouth or throat at bedtime. One teaspoon to brush teeth at bedtime   citalopram 20 MG tablet Commonly known as:  CELEXA Take 20 mg by mouth daily.   COMBIGAN 0.2-0.5 % ophthalmic solution Generic drug:  brimonidine-timolol Place 1 drop into both eyes 2 (two) times daily.   diclofenac sodium 1 % Gel Commonly known as:  VOLTAREN Apply 2 g topically 3 (three) times daily.   diphenoxylate-atropine 2.5-0.025 MG tablet Commonly known as:  LOMOTIL Take 1 tablet by mouth 2 (two) times daily.   donepezil 10 MG tablet Commonly known as:  ARICEPT Take 10 mg by mouth at bedtime. Start on 10/01/16   donepezil 5 MG tablet Commonly known as:  ARICEPT Take 5 mg by mouth at bedtime. X 4 weeks then start Aricept 10 mg daily at bedtime   dorzolamide 2 % ophthalmic solution Commonly known as:  TRUSOPT Place 2 drops into both eyes 2 (two) times daily.   hyoscyamine 0.125 MG tablet Commonly known as:  LEVSIN, ANASPAZ Take 0.125 mg by mouth 3 (three) times daily as needed. Take at 8AM, 12PM, and 5PM to help relieve gas and abdominal discomfort.   loperamide 2 MG tablet Commonly known as:  IMODIUM A-D Take 2 mg by mouth as directed. Give 4mg  initial dose, then 2mg  after each loose stool x48 hours with a maximum of 16mg  in 24 hours. If diarrhea persists, notify MD.   LUMIGAN 0.01 % Soln Generic drug:  bimatoprost Place 1 drop into both eyes at bedtime.     multivitamin with minerals Tabs tablet Take 1 tablet by mouth daily with breakfast.   ROBAFEN AC 100-10 MG/5ML syrup Generic drug:  guaiFENesin-codeine Take 4 mLs by mouth 4 (four) times daily as needed for cough or congestion.      ROS was provided with assistance of staff Review of Systems  Constitutional: Negative for activity change, appetite change, chills, diaphoresis, fatigue and fever.  HENT: Negative for congestion.   Eyes: Negative for visual disturbance.  Respiratory: Negative for cough, chest tightness, shortness of breath and wheezing.   Cardiovascular: Negative for chest pain, palpitations and leg swelling.  Gastrointestinal: Negative for abdominal pain, constipation, diarrhea, nausea and vomiting.  Genitourinary: Negative for difficulty  urinating, dysuria and hematuria.       Incontinent urine, wears adult depends.   Musculoskeletal: Positive for gait problem.  Neurological: Negative for dizziness, speech difficulty, weakness and headaches.  Psychiatric/Behavioral: Positive for confusion. Negative for agitation, behavioral problems, hallucinations and sleep disturbance. The patient is not nervous/anxious.     Immunization History  Administered Date(s) Administered  . Influenza,inj,Quad PF,6+ Mos 11/07/2012, 10/03/2013  . Influenza-Unspecified 12/30/2009, 11/17/2010, 11/30/2011  . Pneumococcal Conjugate-13 01/05/2014  . Pneumococcal Polysaccharide-23 01/30/2006   Pertinent  Health Maintenance Due  Topic Date Due  . INFLUENZA VACCINE  10/30/2016 (Originally 08/30/2016)  . DEXA SCAN  Completed  . PNA vac Low Risk Adult  Completed   Fall Risk  09/19/2016 05/29/2016 01/05/2014 10/03/2013 09/05/2012  Falls in the past year? Yes Yes Yes Yes Yes  Number falls in past yr: 1 2 or more 2 or more 2 or more -  Comment - - - no injury per patient -  Injury with Fall? No Yes - - -  Comment - back pain - - -  Risk Factor Category  - High Fall Risk - High Fall Risk -  Risk for fall  due to : - - - History of fall(s);Impaired balance/gait;Impaired mobility;Impaired vision;Mental status change -   Functional Status Survey:    Vitals:   09/29/16 1434  BP: (!) 148/100  Pulse: 68  Resp: 20  Temp: (!) 97.3 F (36.3 C)  Weight: 191 lb (86.6 kg)  Height: 5\' 5"  (1.651 m)   Body mass index is 31.78 kg/m. Physical Exam  Constitutional: She appears well-developed and well-nourished. No distress.  HENT:  Head: Normocephalic and atraumatic.  Eyes: Pupils are equal, round, and reactive to light. Conjunctivae and EOM are normal.  Neck: Normal range of motion. Neck supple. No thyromegaly present.  Cardiovascular: Normal rate.   No murmur heard. Pulmonary/Chest: Effort normal and breath sounds normal. She has no wheezes. She has no rales.  Abdominal: Soft. Bowel sounds are normal. She exhibits no distension. There is no tenderness.  Lymphadenopathy:    She has no cervical adenopathy.  Neurological: She is alert. No cranial nerve deficit. She exhibits normal muscle tone. Coordination normal.  Oriented to self.   Skin: She is not diaphoretic.  Psychiatric: She has a normal mood and affect. Her behavior is normal.    Labs reviewed:  Recent Labs  07/06/16 2025 07/20/16 09/12/16  NA 141 139 145  K 4.9 4.5 4.4  CL 108  --   --   CO2 27  --   --   GLUCOSE 114*  --   --   BUN 26* 23* 22*  CREATININE 1.18* 1.3* 1.2*  CALCIUM 9.3  --   --     Recent Labs  07/06/16 2025 07/20/16 09/12/16  AST 18 14 15   ALT 15 10 10   ALKPHOS 75 66 58  BILITOT 0.7  --   --   PROT 7.4  --   --   ALBUMIN 3.9  --   --     Recent Labs  07/06/16 2025 07/20/16 09/12/16  WBC 4.1 4.9 3.0  NEUTROABS 2.6  --  38  HGB 10.5* 9.9* 9.3*  HCT 33.7* 30* 29*  MCV 99.1  --   --   PLT 201 245 175   Lab Results  Component Value Date   TSH 2.03 05/09/2016   Lab Results  Component Value Date   HGBA1C 5.4 06/04/2014   Lab Results  Component Value  Date   CHOL 193 06/04/2014   HDL 49  06/04/2014   LDLCALC 124 06/04/2014   TRIG 102 06/04/2014   CHOLHDL 2.7 06/27/2012    Significant Diagnostic Results in last 30 days:  No results found.  Assessment/Plan: Altered mental state reported the patient has lethargic episodes 3-11 shift 09/28/16, obtained CBC BMP order from oncall provider. 09/29/16 Na 141, K 4.7, Bun 24, creat 1.21, wbc 10.9, Hgb 9.5, plt 108, neutrophils 39.8. She is in her usual state of mentation today upon my examination. She is afebrile, denied dysuria, lower abd/CVA pain, no hematuria noted, she wears adult depend for urinary incontinent care. She denied chest pain/palpitation, dizziness, or change of vision. No O2 desaturation. No focal neurological symptoms present. Continue to monitor the patient.   Depression, psychotic ([place]) Mood is stable, continue Citalopram 20mg  qd.   Alzheimer's dementia with behavioral disturbance Difficulty with memory recalls, continue SNF, continue Aricept.     Family/ staff Communication: plan of care reviewed with the patient and charge nures.   Labs/tests ordered:  Pending CBC BMP  Time spend 25 minutes

## 2016-10-10 ENCOUNTER — Non-Acute Institutional Stay (SKILLED_NURSING_FACILITY): Payer: Medicare Other | Admitting: Nurse Practitioner

## 2016-10-10 ENCOUNTER — Encounter: Payer: Self-pay | Admitting: Nurse Practitioner

## 2016-10-10 DIAGNOSIS — F0281 Dementia in other diseases classified elsewhere with behavioral disturbance: Secondary | ICD-10-CM | POA: Diagnosis not present

## 2016-10-10 DIAGNOSIS — R262 Difficulty in walking, not elsewhere classified: Secondary | ICD-10-CM

## 2016-10-10 DIAGNOSIS — K219 Gastro-esophageal reflux disease without esophagitis: Secondary | ICD-10-CM

## 2016-10-10 DIAGNOSIS — I10 Essential (primary) hypertension: Secondary | ICD-10-CM | POA: Diagnosis not present

## 2016-10-10 DIAGNOSIS — G301 Alzheimer's disease with late onset: Secondary | ICD-10-CM | POA: Diagnosis not present

## 2016-10-10 NOTE — Progress Notes (Signed)
Location:  [facility] Room Number: 60-A Place of Service:  SNF (31) Provider:  Makarios Madlock, Manxie  NP  Blanchie Serve, MD  Patient Care Team: Blanchie Serve, MD as PCP - General (Internal Medicine) Stark Klein, MD as Consulting Physician (General Surgery) Magrinat, Virgie Dad, MD as Consulting Physician (Hematology and Oncology) Kyung Rudd, MD as Consulting Physician (Radiation Oncology) Luberta Mutter, MD as Consulting Physician (Ophthalmology) Joannah Gitlin X, NP as Nurse Practitioner (Internal Medicine)  Extended Emergency Contact Information Primary Emergency Contact: [name],[name] Address: [address]          [place], [place] [address] [place] Phone: [phone] Work Phone: [phone] Relation: Daughter  Code Status:  DNR Goals of care: Advanced Directive information Advanced Directives 10/10/2016  Does Patient Have a Medical Advance Directive? Yes  Type of Paramedic of [place];Living will;Out of facility DNR (pink MOST or yellow form)  Does patient want to make changes to medical advance directive? No - Patient declined  Copy of [place] in Chart? Yes  Would patient like information on creating a medical advance directive? -  Pre-existing out of facility DNR order (yellow form or pink MOST form) Yellow form placed in chart (order not valid for inpatient use)     Chief Complaint  Patient presents with  . Medical Management of Chronic Issues    HPI:  Pt is a 83 y.o. female seen today for medical management of chronic diseases.     The patient has history of Alzheimer's dementia, psychosis, she is taking Donepezil 10mg  qd, psychosis is better controlled since Seroquel 12.5mg  09/19/16. Her mood is table, taking Celexa 20mg  qd. Blood pressure is in control, off Amlodipine since 09/08/16.       Past Medical History:  Diagnosis Date  . Abnormal weight gain   . Abnormality of gait     . Acute upper respiratory infections of unspecified site   . Anemia, unspecified   . Appendicitis   . Arthritis   . Chronic kidney disease, stage III (moderate)   . Dementia in conditions classified elsewhere without behavioral disturbance   . Depression   . Diverticulitis   . Diverticulosis of colon (without mention of hemorrhage)   . Dizziness and giddiness   . Edema of leg   . GERD (gastroesophageal reflux disease)   . Glaucoma   . Hyperlipidemia LDL goal < 100   . Hypertension   . Irritable bowel syndrome   . Irritable bowel syndrome   . Leukocytopenia, unspecified   . Malignant neoplasm of breast (female), unspecified site   . Memory loss   . Other B-complex deficiencies   . Other malaise and fatigue   . Other specified symptom associated with female genital organs   . Personal history of fall   . Polymyalgia rheumatica ([place])   . Regional enteritis of unspecified site   . Sciatica   . Sciatica   . Sebaceous cyst   . Unspecified arthropathy, shoulder region   . Unspecified glaucoma(365.9)   . Unspecified vitamin D deficiency   . Urge incontinence   . Vertigo    Past Surgical History:  Procedure Laterality Date  . BREAST LUMPECTOMY  10/28/10   left breast   . BREAST SURGERY     R breast lumpectomy for benign disease  . CATARACT EXTRACTION  1990  . CHOLECYSTECTOMY  1972  . NASAL SINUS SURGERY  1966    Allergies  Allergen Reactions  .  Codeine     States she blacks out. Listed on [facility]    Outpatient Encounter Prescriptions as of 10/10/2016  Medication Sig  . acetaminophen (TYLENOL) 500 MG tablet Take 1,000 mg by mouth 3 (three) times daily. 8AM, 1PM, 6PM  . bimatoprost (LUMIGAN) 0.01 % SOLN Place 1 drop into both eyes at bedtime.   . brimonidine-timolol (COMBIGAN) 0.2-0.5 % ophthalmic solution Place 1 drop into both eyes 2 (two) times daily.   . calcium carbonate (TUMS - DOSED IN MG ELEMENTAL CALCIUM) 500 MG chewable tablet Chew 1 tablet by mouth daily.  .  chlorhexidine (PERIDEX) 0.12 % solution Use as directed 15 mLs in the mouth or throat at bedtime. One teaspoon to brush teeth at bedtime   . citalopram (CELEXA) 20 MG tablet Take 20 mg by mouth daily.  . diclofenac sodium (VOLTAREN) 1 % GEL Apply 2 g topically 3 (three) times daily.  . diphenoxylate-atropine (LOMOTIL) 2.5-0.025 MG tablet Take 1 tablet by mouth 2 (two) times daily.  Marland Kitchen donepezil (ARICEPT) 10 MG tablet Take 10 mg by mouth at bedtime. Start on 10/01/16  . dorzolamide (TRUSOPT) 2 % ophthalmic solution Place 2 drops into both eyes 2 (two) times daily.  Marland Kitchen guaiFENesin-codeine (ROBAFEN AC) 100-10 MG/5ML syrup Take 4 mLs by mouth 4 (four) times daily as needed for cough or congestion.  . hyoscyamine (LEVSIN, ANASPAZ) 0.125 MG tablet Take 0.125 mg by mouth 3 (three) times daily as needed. Take at 8AM, 12PM, and 5PM to help relieve gas and abdominal discomfort.   . loperamide (IMODIUM A-D) 2 MG tablet Take 2 mg by mouth as directed. Give 4mg  initial dose, then 2mg  after each loose stool x48 hours with a maximum of 16mg  in 24 hours. If diarrhea persists, notify MD.  . Multiple Vitamin (MULTIVITAMIN WITH MINERALS) TABS Take 1 tablet by mouth daily with breakfast.   . QUEtiapine (SEROQUEL) 25 MG tablet Take 25 mg by mouth daily.  . [DISCONTINUED] donepezil (ARICEPT) 5 MG tablet Take 5 mg by mouth at bedtime. X 4 weeks then start Aricept 10 mg daily at bedtime   No facility-administered encounter medications on file as of 10/10/2016.    ROS was provided with assistance of staff.  Review of Systems  Constitutional: Negative for appetite change, chills, diaphoresis, fatigue and fever.  HENT: Positive for hearing loss. Negative for congestion, trouble swallowing and voice change.   Eyes: Negative for visual disturbance.  Respiratory: Negative for cough, choking, chest tightness and shortness of breath.   Cardiovascular: Negative for chest pain, palpitations and leg swelling.  Gastrointestinal:  Negative for abdominal distention, abdominal pain, constipation, diarrhea, nausea and vomiting.  Genitourinary: Negative for difficulty urinating and dysuria.       Incontinent of urine.   Musculoskeletal: Positive for gait problem.       W/c to get around, one person assist for transfer.   Skin: Negative for rash and wound.  Neurological: Positive for speech difficulty. Negative for tremors, weakness and headaches.       Dementia. Mumbles at times.   Psychiatric/Behavioral: Positive for behavioral problems and confusion. Negative for agitation, hallucinations and sleep disturbance. The patient is not nervous/anxious.     Immunization History  Administered Date(s) Administered  . Influenza,inj,Quad PF,6+ Mos 11/07/2012, 10/03/2013  . Influenza-Unspecified 12/30/2009, 11/17/2010, 11/30/2011  . Pneumococcal Conjugate-13 01/05/2014  . Pneumococcal Polysaccharide-23 01/30/2006   Pertinent  Health Maintenance Due  Topic Date Due  . INFLUENZA VACCINE  10/30/2016 (Originally 08/30/2016)  . DEXA SCAN  Completed  . PNA vac Low Risk Adult  Completed   Fall Risk  09/19/2016 05/29/2016 01/05/2014 10/03/2013 09/05/2012  Falls in the past year? Yes Yes Yes Yes Yes  Number falls in past yr: 1 2 or more 2 or more 2 or more -  Comment - - - no injury per patient -  Injury with Fall? No Yes - - -  Comment - back pain - - -  Risk Factor Category  - High Fall Risk - High Fall Risk -  Risk for fall due to : - - - History of fall(s);Impaired balance/gait;Impaired mobility;Impaired vision;Mental status change -   Functional Status Survey:    Vitals:   10/10/16 1122  BP: 140/68  Pulse: 72  Resp: 17  Temp: (!) 97.5 F (36.4 C)  SpO2: 96%  Weight: 192 lb 14.4 oz (87.5 kg)  Height: 5\' 5"  (1.651 m)   Body mass index is 32.1 kg/m. Physical Exam  Constitutional: She appears well-developed and well-nourished. No distress.  HENT:  Head: Normocephalic and atraumatic.  Eyes: Pupils are equal, round, and  reactive to light. Conjunctivae and EOM are normal.  Neck: Normal range of motion. Neck supple. No JVD present.  Cardiovascular: Normal rate, regular rhythm and normal heart sounds.   No murmur heard. Pulmonary/Chest: Effort normal and breath sounds normal. She has no wheezes. She has no rales.  Abdominal: Soft. Bowel sounds are normal. She exhibits no distension. There is no tenderness.  Musculoskeletal: Normal range of motion. She exhibits no edema or tenderness.  Neurological: She is alert.  Oriented to self and her room  Skin: Skin is warm and dry. She is not diaphoretic.  Psychiatric: She has a normal mood and affect. Her behavior is normal.    Labs reviewed:  Recent Labs  07/06/16 2025 07/20/16 09/12/16  NA 141 139 145  K 4.9 4.5 4.4  CL 108  --   --   CO2 27  --   --   GLUCOSE 114*  --   --   BUN 26* 23* 22*  CREATININE 1.18* 1.3* 1.2*  CALCIUM 9.3  --   --     Recent Labs  07/06/16 2025 07/20/16 09/12/16  AST 18 14 15   ALT 15 10 10   ALKPHOS 75 66 58  BILITOT 0.7  --   --   PROT 7.4  --   --   ALBUMIN 3.9  --   --     Recent Labs  07/06/16 2025 07/20/16 09/12/16  WBC 4.1 4.9 3.0  NEUTROABS 2.6  --  38  HGB 10.5* 9.9* 9.3*  HCT 33.7* 30* 29*  MCV 99.1  --   --   PLT 201 245 175   Lab Results  Component Value Date   TSH 2.03 05/09/2016   Lab Results  Component Value Date   HGBA1C 5.4 06/04/2014   Lab Results  Component Value Date   CHOL 193 06/04/2014   HDL 49 06/04/2014   LDLCALC 124 06/04/2014   TRIG 102 06/04/2014   CHOLHDL 2.7 06/27/2012    Significant Diagnostic Results in last 30 days:  No results found.  Assessment/Plan Alzheimer's dementia with behavioral disturbance Resides in SNF, managing her psychosis with Seroquel 12.5mg  qd since 09/19/16. Continue Donepezil 10mg  qd.   Essential hypertension, benign Controlled, off Amlodipine since 09/08/16  GERD (gastroesophageal reflux disease) Stable, off acid reducer.   Difficulty  walking Continue wheel chair for mobility.     Family/ staff Communication: plan  of care reviewed with the patient and charge nurse  Labs/tests ordered:  none  Time spend 25 minutes

## 2016-10-10 NOTE — Assessment & Plan Note (Signed)
Stable, off acid reducer 

## 2016-10-10 NOTE — Assessment & Plan Note (Signed)
Continue wheel chair for mobility.

## 2016-10-10 NOTE — Assessment & Plan Note (Signed)
Controlled, off Amlodipine since 09/08/16

## 2016-10-10 NOTE — Assessment & Plan Note (Addendum)
Resides in SNF, managing her psychosis with Seroquel 12.5mg  qd since 09/19/16. Continue Donepezil 10mg  qd.

## 2016-10-13 ENCOUNTER — Encounter: Payer: Self-pay | Admitting: *Deleted

## 2016-10-16 ENCOUNTER — Encounter: Payer: Self-pay | Admitting: Nurse Practitioner

## 2016-10-16 ENCOUNTER — Non-Acute Institutional Stay (SKILLED_NURSING_FACILITY): Payer: Medicare Other | Admitting: Nurse Practitioner

## 2016-10-16 DIAGNOSIS — G301 Alzheimer's disease with late onset: Secondary | ICD-10-CM

## 2016-10-16 DIAGNOSIS — F323 Major depressive disorder, single episode, severe with psychotic features: Secondary | ICD-10-CM | POA: Diagnosis not present

## 2016-10-16 DIAGNOSIS — F02818 Dementia in other diseases classified elsewhere, unspecified severity, with other behavioral disturbance: Secondary | ICD-10-CM

## 2016-10-16 DIAGNOSIS — F0281 Dementia in other diseases classified elsewhere with behavioral disturbance: Secondary | ICD-10-CM | POA: Diagnosis not present

## 2016-10-16 NOTE — Progress Notes (Signed)
Location:  [facility] Room Number: 60-A Place of Service:  SNF (31) Provider: Lennie Odor Cleburn Maiolo NP  Blanchie Serve, MD  Patient Care Team: Blanchie Serve, MD as PCP - General (Internal Medicine) Stark Klein, MD as Consulting Physician (General Surgery) Magrinat, Virgie Dad, MD as Consulting Physician (Hematology and Oncology) Kyung Rudd, MD as Consulting Physician (Radiation Oncology) Luberta Mutter, MD as Consulting Physician (Ophthalmology) Joana Nolton X, NP as Nurse Practitioner (Internal Medicine)  Extended Emergency Contact Information Primary Emergency Contact: [name],[name] Address: [address]          [place], [place] [address] [place] Phone: [phone] Work Phone: [phone] Relation: Daughter  Code Status:  DNR Goals of care: Advanced Directive information Advanced Directives 10/16/2016  Does Patient Have a Medical Advance Directive? Yes  Type of Paramedic of [place];Living will;Out of facility DNR (pink MOST or yellow form)  Does patient want to make changes to medical advance directive? No - Patient declined  Copy of [place] in Chart? Yes  Would patient like information on creating a medical advance directive? -  Pre-existing out of facility DNR order (yellow form or pink MOST form) Yellow form placed in chart (order not valid for inpatient use)     Chief Complaint  Patient presents with  . Acute Visit    Increase in Seroquel    HPI:  Pt is a 83 y.o. female seen today for an acute visit for persisted anxiety, irritability, and delusion, less severity/frquency since Seroquel 12.5mg  started , subsequently Seroquel was increased to 50mg  qd daily 10/13/16, prn Lorazepam 0.5mg  q12h prn x 3days(9/14, 9/15, 9/16)-not used. The patient is calmer and no delusions upon my visit today.    The patient has history of dementia, resides in SNF, takes Donepezil 10mg  daily, she propels  self in wheel chair to get around.  Past Medical History:  Diagnosis Date  . Abnormal weight gain   . Abnormality of gait   . Acute upper respiratory infections of unspecified site   . Anemia, unspecified   . Appendicitis   . Arthritis   . Chronic kidney disease, stage III (moderate)   . Dementia in conditions classified elsewhere without behavioral disturbance   . Depression   . Diverticulitis   . Diverticulosis of colon (without mention of hemorrhage)   . Dizziness and giddiness   . Edema of leg   . GERD (gastroesophageal reflux disease)   . Glaucoma   . Hyperlipidemia LDL goal < 100   . Hypertension   . Irritable bowel syndrome   . Irritable bowel syndrome   . Leukocytopenia, unspecified   . Malignant neoplasm of breast (female), unspecified site   . Memory loss   . Other B-complex deficiencies   . Other malaise and fatigue   . Other specified symptom associated with female genital organs   . Personal history of fall   . Polymyalgia rheumatica ([place])   . Regional enteritis of unspecified site   . Sciatica   . Sciatica   . Sebaceous cyst   . Unspecified arthropathy, shoulder region   . Unspecified glaucoma(365.9)   . Unspecified vitamin D deficiency   . Urge incontinence   . Vertigo    Past Surgical History:  Procedure Laterality Date  . BREAST LUMPECTOMY  10/28/10   left breast   . BREAST SURGERY     R breast lumpectomy for benign disease  . CATARACT EXTRACTION  1990  . CHOLECYSTECTOMY  [place]    Allergies  Allergen Reactions  . Codeine     States she blacks out. Listed on [facility]    Outpatient Encounter Prescriptions as of 10/16/2016  Medication Sig  . acetaminophen (TYLENOL) 500 MG tablet Take 1,000 mg by mouth 3 (three) times daily. 8AM, 1PM, 6PM  . bimatoprost (LUMIGAN) 0.01 % SOLN Place 1 drop into both eyes at bedtime.   . brimonidine-timolol (COMBIGAN) 0.2-0.5 % ophthalmic solution Place 1 drop into both eyes 2 (two) times  daily.   . calcium carbonate (TUMS - DOSED IN MG ELEMENTAL CALCIUM) 500 MG chewable tablet Chew 1 tablet by mouth daily.  . chlorhexidine (PERIDEX) 0.12 % solution Use as directed 15 mLs in the mouth or throat at bedtime. One teaspoon to brush teeth at bedtime   . citalopram (CELEXA) 20 MG tablet Take 20 mg by mouth daily.  . diclofenac sodium (VOLTAREN) 1 % GEL Apply 2 g topically 3 (three) times daily.  . diphenoxylate-atropine (LOMOTIL) 2.5-0.025 MG tablet Take 1 tablet by mouth 2 (two) times daily.  Marland Kitchen donepezil (ARICEPT) 10 MG tablet Take 10 mg by mouth at bedtime. Start on 10/01/16  . dorzolamide (TRUSOPT) 2 % ophthalmic solution Place 2 drops into both eyes 2 (two) times daily.  Marland Kitchen guaiFENesin-codeine (ROBAFEN AC) 100-10 MG/5ML syrup Take 4 mLs by mouth 4 (four) times daily as needed for cough or congestion.  . hyoscyamine (LEVSIN, ANASPAZ) 0.125 MG tablet Take 0.125 mg by mouth 3 (three) times daily as needed. Take at 8AM, 12PM, and 5PM to help relieve gas and abdominal discomfort.   . loperamide (IMODIUM A-D) 2 MG tablet Take 2 mg by mouth as directed. Give 4mg  initial dose, then 2mg  after each loose stool x48 hours with a maximum of 16mg  in 24 hours. If diarrhea persists, notify MD.  . Multiple Vitamin (MULTIVITAMIN WITH MINERALS) TABS Take 1 tablet by mouth daily with breakfast.   . QUEtiapine (SEROQUEL) 25 MG tablet Take 25 mg by mouth daily.   No facility-administered encounter medications on file as of 10/16/2016.    ROS was provided with assistance of staff.  Review of Systems  Constitutional: Negative for activity change, appetite change, chills, diaphoresis, fatigue and fever.  HENT: Negative for congestion.   Eyes: Negative for visual disturbance.  Respiratory: Negative for cough and choking.   Cardiovascular: Negative for chest pain, palpitations and leg swelling.  Gastrointestinal: Negative for abdominal distention and abdominal pain.  Endocrine: Negative for cold intolerance.    Genitourinary: Negative for difficulty urinating, dysuria, frequency and urgency.       Leakage of urine, uses adult depends   Musculoskeletal: Positive for gait problem.       Wheel chair to get around.   Skin: Negative for pallor and wound.  Neurological: Negative for dizziness, tremors, speech difficulty, weakness and headaches.  Psychiatric/Behavioral: Positive for agitation, behavioral problems and confusion. The patient is nervous/anxious.        Delusions, reported 10/13/16    Immunization History  Administered Date(s) Administered  . Influenza,inj,Quad PF,6+ Mos 11/07/2012, 10/03/2013  . Influenza-Unspecified 12/30/2009, 11/17/2010, 11/30/2011  . Pneumococcal Conjugate-13 01/05/2014  . Pneumococcal Polysaccharide-23 01/30/2006   Pertinent  Health Maintenance Due  Topic Date Due  . INFLUENZA VACCINE  10/30/2016 (Originally 08/30/2016)  . DEXA SCAN  Completed  . PNA vac Low Risk Adult  Completed   Fall Risk  09/19/2016 05/29/2016 01/05/2014 10/03/2013 09/05/2012  Falls in the past year? Yes  Yes Yes Yes Yes  Number falls in past yr: 1 2 or more 2 or more 2 or more -  Comment - - - no injury per patient -  Injury with Fall? No Yes - - -  Comment - back pain - - -  Risk Factor Category  - High Fall Risk - High Fall Risk -  Risk for fall due to : - - - History of fall(s);Impaired balance/gait;Impaired mobility;Impaired vision;Mental status change -   Functional Status Survey:    Vitals:   10/16/16 1515  BP: 114/68  Pulse: 68  Resp: 17  Temp: (!) 97.5 F (36.4 C)  Weight: 192 lb 14.4 oz (87.5 kg)  Height: 5\' 5"  (1.651 m)   Body mass index is 32.1 kg/m. Physical Exam  Constitutional: She appears well-developed and well-nourished. No distress.  HENT:  Head: Normocephalic and atraumatic.  Eyes: Pupils are equal, round, and reactive to light. Conjunctivae and EOM are normal.  Cardiovascular: Normal rate, regular rhythm and normal heart sounds.   No murmur  heard. Pulmonary/Chest: Effort normal and breath sounds normal. She has no wheezes. She has no rales.  Musculoskeletal: Normal range of motion. She exhibits no edema or tenderness.  Neurological: She is alert. No cranial nerve deficit. She exhibits normal muscle tone.  Oriented to person and her room  Skin: She is not diaphoretic.  Psychiatric: She has a normal mood and affect. Her behavior is normal.  Upon my examination today.     Labs reviewed:  Recent Labs  07/06/16 2025 07/20/16 09/12/16  NA 141 139 145  K 4.9 4.5 4.4  CL 108  --   --   CO2 27  --   --   GLUCOSE 114*  --   --   BUN 26* 23* 22*  CREATININE 1.18* 1.3* 1.2*  CALCIUM 9.3  --   --     Recent Labs  07/06/16 2025 07/20/16 09/12/16  AST 18 14 15   ALT 15 10 10   ALKPHOS 75 66 58  BILITOT 0.7  --   --   PROT 7.4  --   --   ALBUMIN 3.9  --   --     Recent Labs  07/06/16 2025 07/20/16 09/12/16  WBC 4.1 4.9 3.0  NEUTROABS 2.6  --  38  HGB 10.5* 9.9* 9.3*  HCT 33.7* 30* 29*  MCV 99.1  --   --   PLT 201 245 175   Lab Results  Component Value Date   TSH 2.03 05/09/2016   Lab Results  Component Value Date   HGBA1C 5.4 06/04/2014   Lab Results  Component Value Date   CHOL 193 06/04/2014   HDL 49 06/04/2014   LDLCALC 124 06/04/2014   TRIG 102 06/04/2014   CHOLHDL 2.7 06/27/2012    Significant Diagnostic Results in last 30 days:  No results found.  Assessment/Plan Depression, psychotic ([place]) The patient has on and off anxiety, irritability, delusion, less severity since Seroquel 12.5mg  started 09/19/16, subsequently Seroquel was increased to 50mg  qd daily 10/13/16 for worsening of the above stated symptoms, prn Lorazepam 0.5mg  q12h prn x 3days(9/14, 9/15, 9/16) was not used. Will continue Citalopram 20mg  qd and Seroquel 25mg /50mg  po qd. Observe.   Alzheimer's dementia with behavioral disturbance Resides in SNF, she is stable.  Continue Donepezil 10mg  qd. Will       Family/ staff  Communication: plan of care reviewed with the patient and charge nurse.   Labs/tests ordered:  none  Time spend 25 minutes

## 2016-10-16 NOTE — Assessment & Plan Note (Addendum)
The patient has on and off anxiety, irritability, delusion, less severity since Seroquel 12.5mg  started 09/19/16, subsequently Seroquel was increased to 50mg  qd daily 10/13/16 for worsening of the above stated symptoms, prn Lorazepam 0.5mg  q12h prn x 3days(9/14, 9/15, 9/16) was not used. Will continue Citalopram 20mg  qd and Seroquel 25mg /50mg  po qd. Observe.

## 2016-10-16 NOTE — Progress Notes (Signed)
Location:  [facility] Room Number: 60-A Place of Service:  SNF (31) Provider:  Mast, Manxie  NP  Blanchie Serve, MD  Patient Care Team: Blanchie Serve, MD as PCP - General (Internal Medicine) Stark Klein, MD as Consulting Physician (General Surgery) Magrinat, Virgie Dad, MD as Consulting Physician (Hematology and Oncology) Kyung Rudd, MD as Consulting Physician (Radiation Oncology) Luberta Mutter, MD as Consulting Physician (Ophthalmology) Mast, Man X, NP as Nurse Practitioner (Internal Medicine)  Extended Emergency Contact Information Primary Emergency Contact: [name],[name] Address: [address]          [place], [place] [address] [place] Phone: [phone] Work Phone: [phone] Relation: Daughter  Code Status:  DNR Goals of care: Advanced Directive information Advanced Directives 10/16/2016  Does Patient Have a Medical Advance Directive? Yes  Type of Paramedic of [place];Living will;Out of facility DNR (pink MOST or yellow form)  Does patient want to make changes to medical advance directive? No - Patient declined  Copy of [place] in Chart? Yes  Would patient like information on creating a medical advance directive? -  Pre-existing out of facility DNR order (yellow form or pink MOST form) Yellow form placed in chart (order not valid for inpatient use)     Chief Complaint  Patient presents with  . Acute Visit    Increase in Seroquel    HPI:  Pt is a 83 y.o. female seen today for an acute visit for    Past Medical History:  Diagnosis Date  . Abnormal weight gain   . Abnormality of gait   . Acute upper respiratory infections of unspecified site   . Anemia, unspecified   . Appendicitis   . Arthritis   . Chronic kidney disease, stage III (moderate)   . Dementia in conditions classified elsewhere without behavioral disturbance   . Depression   . Diverticulitis   .  Diverticulosis of colon (without mention of hemorrhage)   . Dizziness and giddiness   . Edema of leg   . GERD (gastroesophageal reflux disease)   . Glaucoma   . Hyperlipidemia LDL goal < 100   . Hypertension   . Irritable bowel syndrome   . Irritable bowel syndrome   . Leukocytopenia, unspecified   . Malignant neoplasm of breast (female), unspecified site   . Memory loss   . Other B-complex deficiencies   . Other malaise and fatigue   . Other specified symptom associated with female genital organs   . Personal history of fall   . Polymyalgia rheumatica ([place])   . Regional enteritis of unspecified site   . Sciatica   . Sciatica   . Sebaceous cyst   . Unspecified arthropathy, shoulder region   . Unspecified glaucoma(365.9)   . Unspecified vitamin D deficiency   . Urge incontinence   . Vertigo    Past Surgical History:  Procedure Laterality Date  . BREAST LUMPECTOMY  10/28/10   left breast   . BREAST SURGERY     R breast lumpectomy for benign disease  . CATARACT EXTRACTION  1990  . CHOLECYSTECTOMY  1972  . NASAL SINUS SURGERY  1966    Allergies  Allergen Reactions  . Codeine     States she blacks out. Listed on [facility]    Outpatient Encounter Prescriptions as of 10/16/2016  Medication Sig  . acetaminophen (TYLENOL) 500 MG tablet Take 1,000 mg by mouth 3 (three) times daily. 8AM, 1PM, 6PM  .  bimatoprost (LUMIGAN) 0.01 % SOLN Place 1 drop into both eyes at bedtime.   . brimonidine-timolol (COMBIGAN) 0.2-0.5 % ophthalmic solution Place 1 drop into both eyes 2 (two) times daily.   . calcium carbonate (TUMS - DOSED IN MG ELEMENTAL CALCIUM) 500 MG chewable tablet Chew 1 tablet by mouth daily.  . chlorhexidine (PERIDEX) 0.12 % solution Use as directed 15 mLs in the mouth or throat at bedtime. One teaspoon to brush teeth at bedtime   . citalopram (CELEXA) 20 MG tablet Take 20 mg by mouth daily.  . diclofenac sodium (VOLTAREN) 1 % GEL Apply 2 g topically 3 (three) times daily.  .  diphenoxylate-atropine (LOMOTIL) 2.5-0.025 MG tablet Take 1 tablet by mouth 2 (two) times daily.  Marland Kitchen donepezil (ARICEPT) 10 MG tablet Take 10 mg by mouth at bedtime. Start on 10/01/16  . dorzolamide (TRUSOPT) 2 % ophthalmic solution Place 2 drops into both eyes 2 (two) times daily.  Marland Kitchen guaiFENesin-codeine (ROBAFEN AC) 100-10 MG/5ML syrup Take 4 mLs by mouth 4 (four) times daily as needed for cough or congestion.  . hyoscyamine (LEVSIN, ANASPAZ) 0.125 MG tablet Take 0.125 mg by mouth 3 (three) times daily as needed. Take at 8AM, 12PM, and 5PM to help relieve gas and abdominal discomfort.   . loperamide (IMODIUM A-D) 2 MG tablet Take 2 mg by mouth as directed. Give 4mg  initial dose, then 2mg  after each loose stool x48 hours with a maximum of 16mg  in 24 hours. If diarrhea persists, notify MD.  . LORazepam (ATIVAN) 0.5 MG tablet Take 0.5 mg by mouth every 12 (twelve) hours as needed for anxiety. For 3 days  . Multiple Vitamin (MULTIVITAMIN WITH MINERALS) TABS Take 1 tablet by mouth daily with breakfast.   . QUEtiapine (SEROQUEL) 25 MG tablet Take 50 mg by mouth daily.    No facility-administered encounter medications on file as of 10/16/2016.     Review of Systems  Immunization History  Administered Date(s) Administered  . Influenza,inj,Quad PF,6+ Mos 11/07/2012, 10/03/2013  . Influenza-Unspecified 12/30/2009, 11/17/2010, 11/30/2011  . Pneumococcal Conjugate-13 01/05/2014  . Pneumococcal Polysaccharide-23 01/30/2006   Pertinent  Health Maintenance Due  Topic Date Due  . INFLUENZA VACCINE  10/30/2016 (Originally 08/30/2016)  . DEXA SCAN  Completed  . PNA vac Low Risk Adult  Completed   Fall Risk  09/19/2016 05/29/2016 01/05/2014 10/03/2013 09/05/2012  Falls in the past year? Yes Yes Yes Yes Yes  Number falls in past yr: 1 2 or more 2 or more 2 or more -  Comment - - - no injury per patient -  Injury with Fall? No Yes - - -  Comment - back pain - - -  Risk Factor Category  - High Fall Risk - High Fall  Risk -  Risk for fall due to : - - - History of fall(s);Impaired balance/gait;Impaired mobility;Impaired vision;Mental status change -   Functional Status Survey:    Vitals:   10/16/16 1515  BP: 114/68  Pulse: 68  Resp: 17  Temp: (!) 97.5 F (36.4 C)  Weight: 192 lb 14.4 oz (87.5 kg)  Height: 5\' 5"  (1.651 m)   Body mass index is 32.1 kg/m. Physical Exam  Labs reviewed:  Recent Labs  07/06/16 2025 07/20/16 09/12/16  NA 141 139 145  K 4.9 4.5 4.4  CL 108  --   --   CO2 27  --   --   GLUCOSE 114*  --   --   BUN 26* 23* 22*  CREATININE 1.18* 1.3* 1.2*  CALCIUM 9.3  --   --     Recent Labs  07/06/16 2025 07/20/16 09/12/16  AST 18 14 15   ALT 15 10 10   ALKPHOS 75 66 58  BILITOT 0.7  --   --   PROT 7.4  --   --   ALBUMIN 3.9  --   --     Recent Labs  07/06/16 2025 07/20/16 09/12/16  WBC 4.1 4.9 3.0  NEUTROABS 2.6  --  38  HGB 10.5* 9.9* 9.3*  HCT 33.7* 30* 29*  MCV 99.1  --   --   PLT 201 245 175   Lab Results  Component Value Date   TSH 2.03 05/09/2016   Lab Results  Component Value Date   HGBA1C 5.4 06/04/2014   Lab Results  Component Value Date   CHOL 193 06/04/2014   HDL 49 06/04/2014   LDLCALC 124 06/04/2014   TRIG 102 06/04/2014   CHOLHDL 2.7 06/27/2012    Significant Diagnostic Results in last 30 days:  No results found.  Assessment/Plan 1. Depression, psychotic ([place])   2. Late onset Alzheimer's disease with behavioral disturbance     Family/ staff Communication:   Labs/tests ordered:

## 2016-10-16 NOTE — Assessment & Plan Note (Addendum)
Resides in SNF, she is stable.  Continue Donepezil 10mg  qd. Will

## 2016-10-24 ENCOUNTER — Emergency Department (HOSPITAL_COMMUNITY): Payer: Medicare Other

## 2016-10-24 ENCOUNTER — Encounter (HOSPITAL_COMMUNITY): Payer: Self-pay | Admitting: Radiology

## 2016-10-24 ENCOUNTER — Emergency Department (HOSPITAL_COMMUNITY)
Admission: EM | Admit: 2016-10-24 | Discharge: 2016-10-24 | Disposition: A | Discharge status: Home / Self Care | Payer: Medicare Other | Attending: Emergency Medicine | Admitting: Emergency Medicine

## 2016-10-24 DIAGNOSIS — W19XXXA Unspecified fall, initial encounter: Secondary | ICD-10-CM | POA: Diagnosis not present

## 2016-10-24 DIAGNOSIS — N183 Chronic kidney disease, stage 3 (moderate): Secondary | ICD-10-CM | POA: Insufficient documentation

## 2016-10-24 DIAGNOSIS — Y999 Unspecified external cause status: Secondary | ICD-10-CM | POA: Diagnosis not present

## 2016-10-24 DIAGNOSIS — Z79899 Other long term (current) drug therapy: Secondary | ICD-10-CM | POA: Insufficient documentation

## 2016-10-24 DIAGNOSIS — S0990XA Unspecified injury of head, initial encounter: Secondary | ICD-10-CM | POA: Diagnosis not present

## 2016-10-24 DIAGNOSIS — I129 Hypertensive chronic kidney disease with stage 1 through stage 4 chronic kidney disease, or unspecified chronic kidney disease: Secondary | ICD-10-CM | POA: Diagnosis not present

## 2016-10-24 DIAGNOSIS — G309 Alzheimer's disease, unspecified: Secondary | ICD-10-CM | POA: Diagnosis not present

## 2016-10-24 DIAGNOSIS — Z043 Encounter for examination and observation following other accident: Secondary | ICD-10-CM | POA: Diagnosis not present

## 2016-10-24 DIAGNOSIS — Y939 Activity, unspecified: Secondary | ICD-10-CM | POA: Diagnosis not present

## 2016-10-24 DIAGNOSIS — R4182 Altered mental status, unspecified: Secondary | ICD-10-CM | POA: Diagnosis not present

## 2016-10-24 DIAGNOSIS — Y929 Unspecified place or not applicable: Secondary | ICD-10-CM | POA: Diagnosis not present

## 2016-10-24 DIAGNOSIS — S0190XA Unspecified open wound of unspecified part of head, initial encounter: Secondary | ICD-10-CM | POA: Diagnosis not present

## 2016-10-24 DIAGNOSIS — S199XXA Unspecified injury of neck, initial encounter: Secondary | ICD-10-CM | POA: Diagnosis not present

## 2016-10-24 DIAGNOSIS — S299XXA Unspecified injury of thorax, initial encounter: Secondary | ICD-10-CM | POA: Diagnosis not present

## 2016-10-24 DIAGNOSIS — S0081XA Abrasion of other part of head, initial encounter: Secondary | ICD-10-CM | POA: Diagnosis not present

## 2016-10-24 DIAGNOSIS — M542 Cervicalgia: Secondary | ICD-10-CM | POA: Diagnosis not present

## 2016-10-24 DIAGNOSIS — R2689 Other abnormalities of gait and mobility: Secondary | ICD-10-CM | POA: Diagnosis not present

## 2016-10-24 DIAGNOSIS — S4991XA Unspecified injury of right shoulder and upper arm, initial encounter: Secondary | ICD-10-CM | POA: Diagnosis not present

## 2016-10-24 NOTE — ED Notes (Signed)
Bed: [facility] Expected date:  Expected time:  Means of arrival:  Comments: EMS- 83yo F, fall/neck pain

## 2016-10-24 NOTE — Discharge Instructions (Signed)
As discussed, your evaluation today has been largely reassuring.  But, it is important that you monitor your condition carefully, and do not hesitate to return to the ED if you develop new, or concerning changes in your condition. ? ?Otherwise, please follow-up with your physician for appropriate ongoing care. ? ?

## 2016-10-24 NOTE — ED Triage Notes (Signed)
Per EMS, pt is coming from [facility] after pt experienced an unwitnessed fall at approx 0430 this morning. Pt was being observed at the nurses station and started complaining of neck pain. Pt has an abrasion on the forehead and no swelling. Pt does not take anticoagulants. Pt has a hx of dementia.

## 2016-10-24 NOTE — ED Notes (Signed)
Pt's daughterDaun Peacock [phone]                                     Work [phone]

## 2016-10-24 NOTE — ED Provider Notes (Signed)
[facility] DEPT Provider Note   CSN: [id number] Arrival date & time: 10/24/16  9326     History   Chief Complaint Chief Complaint  Patient presents with  . Fall; Neck Pain    HPI Michelle [name] is a 83 y.o. female.  HPI  Patient presents from her nursing facility after a fall that occurred approximately 4 hours ago. Follows witnessed, patient fell onto her head/face. No reported loss of consciousness. Since the fall there has been an increasingly large hematoma, with minor abrasion on the forehead. Initially patient seemingly denied complaints, but over the ensuing hours developed pain in the forehead as well as neck. Currently the patient planes of pain in her forehead, neck, right shoulder, right upper chest. Patient does have history of dementia, level V caveat, but for specific brief questions she seems to answer answers appropriately. History also provided by EMS and nursing home notes.  Past Medical History:  Diagnosis Date  . Abnormal weight gain   . Abnormality of gait   . Acute upper respiratory infections of unspecified site   . Anemia, unspecified   . Appendicitis   . Arthritis   . Chronic kidney disease, stage III (moderate)   . Dementia in conditions classified elsewhere without behavioral disturbance   . Depression   . Diverticulitis   . Diverticulosis of colon (without mention of hemorrhage)   . Dizziness and giddiness   . Edema of leg   . GERD (gastroesophageal reflux disease)   . Glaucoma   . Hyperlipidemia LDL goal < 100   . Hypertension   . Irritable bowel syndrome   . Irritable bowel syndrome   . Leukocytopenia, unspecified   . Malignant neoplasm of breast (female), unspecified site   . Memory loss   . Other B-complex deficiencies   . Other malaise and fatigue   . Other specified symptom associated with female genital organs   . Personal history of fall   . Polymyalgia rheumatica ([place])   . Regional enteritis of unspecified site   .  Sciatica   . Sciatica   . Sebaceous cyst   . Unspecified arthropathy, shoulder region   . Unspecified glaucoma(365.9)   . Unspecified vitamin D deficiency   . Urge incontinence   . Vertigo     Patient Active Problem List   Diagnosis Date Noted  . Altered mental state 09/29/2016  . Delusion ([place]) 09/11/2016  . Localized osteoarthritis of shoulder regions, bilateral 07/18/2016  . CKD (chronic kidney disease) stage 3, GFR 30-59 ml/min 07/17/2016  . Allergic rhinitis 07/17/2016  . Chronic midline low back pain without sciatica 07/17/2016  . History of ductal carcinoma in situ (DCIS) of breast 07/17/2016  . Altered mental status, unspecified 07/07/2016  . Paresthesia 05/29/2016  . Anemia associated with chronic renal failure 05/10/2016  . Depression, psychotic ([place]) 05/04/2016  . GERD (gastroesophageal reflux disease) 05/04/2016  . Diarrhea 05/04/2016  . Acute renal failure superimposed on stage 3 chronic kidney disease ([place]) 03/31/2014  . Traumatic subdural hematoma ([facility])   . Rib pain on right side 10/03/2013  . Glaucoma 10/03/2013  . Essential hypertension, benign 10/03/2013  . Myalgia and myositis 10/03/2013  . 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 Alzheimer's dementia with behavioral disturbance 06/30/2012  . Urge incontinence   . Hyperlipidemia LDL goal < 100   . Fall 02/13/2011  . UTI (urinary tract infection) 02/13/2011  . Difficulty walking 02/12/2011  . Polymyalgia rheumatica (  [place]) 02/12/2011    Past Surgical History:  Procedure Laterality Date  . BREAST LUMPECTOMY  10/28/10   left breast   . BREAST SURGERY     R breast lumpectomy for benign disease  . CATARACT EXTRACTION  1990  . CHOLECYSTECTOMY  1972  . NASAL SINUS SURGERY  1966    OB History    No data available       Home Medications    Prior to Admission medications   Medication Sig Start Date End Date Taking? Authorizing Provider   acetaminophen (TYLENOL) 500 MG tablet Take 1,000 mg by mouth 3 (three) times daily. 8AM, 1PM, 6PM    [provider]  bimatoprost (LUMIGAN) 0.01 % SOLN Place 1 drop into both eyes at bedtime.     [provider]  brimonidine-timolol (COMBIGAN) 0.2-0.5 % ophthalmic solution Place 1 drop into both eyes 2 (two) times daily.     [provider]  calcium carbonate (TUMS - DOSED IN MG ELEMENTAL CALCIUM) 500 MG chewable tablet Chew 1 tablet by mouth daily.    [provider]  chlorhexidine (PERIDEX) 0.12 % solution Use as directed 15 mLs in the mouth or throat at bedtime. One teaspoon to brush teeth at bedtime     [provider]  citalopram (CELEXA) 20 MG tablet Take 20 mg by mouth daily.    [provider]  diclofenac sodium (VOLTAREN) 1 % GEL Apply 2 g topically 3 (three) times daily.    [provider]  diphenoxylate-atropine (LOMOTIL) 2.5-0.025 MG tablet Take 1 tablet by mouth 2 (two) times daily.    [provider]  donepezil (ARICEPT) 10 MG tablet Take 10 mg by mouth at bedtime. Start on 10/01/16    [provider]  dorzolamide (TRUSOPT) 2 % ophthalmic solution Place 2 drops into both eyes 2 (two) times daily.    [provider]  guaiFENesin-codeine (ROBAFEN AC) 100-10 MG/5ML syrup Take 4 mLs by mouth 4 (four) times daily as needed for cough or congestion.    [provider]  hyoscyamine (LEVSIN, ANASPAZ) 0.125 MG tablet Take 0.125 mg by mouth 3 (three) times daily as needed. Take at 8AM, 12PM, and 5PM to help relieve gas and abdominal discomfort.     [provider]  loperamide (IMODIUM A-D) 2 MG tablet Take 2 mg by mouth as directed. Give 4mg  initial dose, then 2mg  after each loose stool x48 hours with a maximum of 16mg  in 24 hours. If diarrhea persists, notify MD.    [provider]  Multiple Vitamin (MULTIVITAMIN WITH MINERALS) TABS Take 1 tablet by mouth daily with breakfast.      [provider]  QUEtiapine (SEROQUEL) 25 MG tablet Take 50 mg by mouth daily.     [provider]    Family History Family History  Problem Relation Age of Onset  . Stroke Father   . Hypertension Mother   . Stroke Mother   . Breast cancer Mother   . Heart attack Brother   . Cancer Brother   . Stroke Brother   . Heart attack Son   . Colon cancer Sister     Social History Social History  Substance Use Topics  . Smoking status: Never Smoker  . Smokeless tobacco: Never Used  . Alcohol use No     Allergies   Codeine   Review of Systems Review of Systems  Unable to perform ROS: Dementia     Physical Exam Updated Vital Signs SpO2  97%   Physical Exam  Constitutional: She has a sickly appearance.  Elderly female resting, towel roll immobilization in place around her neck.   HENT:  Head: Normocephalic and atraumatic.    Eyes: Conjunctivae and EOM are normal.  Neck:  Patient hesitant to move secondary to pain, no gross deformity  Cardiovascular: Normal rate and regular rhythm.   Pulmonary/Chest: Effort normal and breath sounds normal. No stridor. No respiratory distress.  Abdominal: She exhibits no distension.  Musculoskeletal: She exhibits no edema.       Arms: Neurological: No cranial nerve deficit.  No facial asymmetry, speech is brief, clear, minimal [place], patient is primary [place] speaker. She does move all extremity spontaneously  Skin: Skin is warm and dry.  Psychiatric: She has a normal mood and affect.  Nursing note and vitals reviewed.    ED Treatments / Results   Radiology Dg Chest 2 View  Result Date: 10/24/2016 CLINICAL DATA:  Unwitnessed fall at skilled nursing facility today. The patient is complaining of anterior chest discomfort in right shoulder discomfort. EXAM: CHEST  2 VIEW COMPARISON:  Chest x-ray of July 06, 2016 FINDINGS: The lungs are adequately inflated and clear. The heart is top-normal in size. The pulmonary  vascularity is normal. There is calcification in the wall of the thoracic aorta. As best as can be determined the thoracic vertebral bodies are preserved in height. The observed ribs are unremarkable. IMPRESSION: No evidence of acute post traumatic injury of the thorax. Thoracic aortic atherosclerosis. Electronically Signed   By: David  Martinique M.D.   On: 10/24/2016 10:07   Dg Shoulder Right  Result Date: 10/24/2016 CLINICAL DATA:  Unwitnessed fall at skilled nursing facility today. The patient complains of right shoulder discomfort. EXAM: RIGHT SHOULDER - 2+ VIEW COMPARISON:  Right shoulder series of October 13, 2014 FINDINGS: Positioning of the shoulder on today's study is limited due to the patient's symptoms. There is considerable degenerative change of the glenohumeral joint. The joint spaces never seen non [place]. No acute fracture of the humeral head is observed. The coracoid and acromion are grossly intact. The observed portions of the right clavicle appear normal. There is considerable soft tissue calcification surrounding the glenohumeral joint. IMPRESSION: Chronic degenerative change of the glenohumeral joint. No objective evidence of an acute fracture or dislocation. Electronically Signed   By: David  Martinique M.D.   On: 10/24/2016 10:09   Ct Head Wo Contrast  Result Date: 10/24/2016 CLINICAL DATA:  Fall, frontal abrasions. EXAM: CT HEAD WITHOUT CONTRAST CT CERVICAL SPINE WITHOUT CONTRAST TECHNIQUE: Multidetector CT imaging of the head and cervical spine was performed following the standard protocol without intravenous contrast. Multiplanar CT image reconstructions of the cervical spine were also generated. COMPARISON:  10/17/2014 FINDINGS: CT HEAD FINDINGS Brain: There is atrophy and chronic small vessel disease changes. No acute intracranial abnormality. Specifically, no hemorrhage, hydrocephalus, mass lesion, acute infarction, or significant intracranial injury. Vascular: No hyperdense vessel or  unexpected calcification. Skull: No acute calvarial abnormality. Sinuses/Orbits: Postoperative changes in the right paranasal sinuses. Mucosal thickening in scattered paranasal sinuses. Mastoid air cells are clear. Orbital soft tissues unremarkable. Other: None CT CERVICAL SPINE FINDINGS Alignment: Normal Skull base and vertebrae: No fracture Soft tissues and spinal canal: Prevertebral soft tissues are normal. No epidural or paraspinal hematoma. Disc levels: Mild diffuse degenerative facet disease. Early degenerative disc disease in the lower cervical spine with anterior spurring. Upper chest: Negative Other: None IMPRESSION: No acute intracranial abnormality. Atrophy, chronic microvascular disease. Degenerative changes in  the cervical spine. No acute bony abnormality. Electronically Signed   By: Rolm Baptise M.D.   On: 10/24/2016 10:14   Ct Cervical Spine Wo Contrast  Result Date: 10/24/2016 CLINICAL DATA:  Fall, frontal abrasions. EXAM: CT HEAD WITHOUT CONTRAST CT CERVICAL SPINE WITHOUT CONTRAST TECHNIQUE: Multidetector CT imaging of the head and cervical spine was performed following the standard protocol without intravenous contrast. Multiplanar CT image reconstructions of the cervical spine were also generated. COMPARISON:  10/17/2014 FINDINGS: CT HEAD FINDINGS Brain: There is atrophy and chronic small vessel disease changes. No acute intracranial abnormality. Specifically, no hemorrhage, hydrocephalus, mass lesion, acute infarction, or significant intracranial injury. Vascular: No hyperdense vessel or unexpected calcification. Skull: No acute calvarial abnormality. Sinuses/Orbits: Postoperative changes in the right paranasal sinuses. Mucosal thickening in scattered paranasal sinuses. Mastoid air cells are clear. Orbital soft tissues unremarkable. Other: None CT CERVICAL SPINE FINDINGS Alignment: Normal Skull base and vertebrae: No fracture Soft tissues and spinal canal: Prevertebral soft tissues are  normal. No epidural or paraspinal hematoma. Disc levels: Mild diffuse degenerative facet disease. Early degenerative disc disease in the lower cervical spine with anterior spurring. Upper chest: Negative Other: None IMPRESSION: No acute intracranial abnormality. Atrophy, chronic microvascular disease. Degenerative changes in the cervical spine. No acute bony abnormality. Electronically Signed   By: Rolm Baptise M.D.   On: 10/24/2016 10:14    Procedures Procedures (including critical care time)   Initial Impression / Assessment and Plan / ED Course  I have reviewed the triage vital signs and the nursing notes.  Pertinent labs & imaging results that were available during my care of the patient were reviewed by me and considered in my medical decision making (see chart for details).  On repeat exam the patient is in no distress. Findings are reassuring, vital signs unremarkable, and no evidence for new neurologic changes per With reassuring CT scans, vitals, patient will be returned to her nursing facility.  Final Clinical Impressions(s) / ED Diagnoses  Fall, initial encounter Head trauma Facial abrasion   Carmin Muskrat, MD 10/24/16 1042

## 2016-10-24 NOTE — ED Notes (Signed)
Attempted to call report. Nurse unavailable. Message was left regarding pt condition and return to facility.

## 2016-10-26 ENCOUNTER — Non-Acute Institutional Stay (SKILLED_NURSING_FACILITY): Payer: Medicare Other | Admitting: Internal Medicine

## 2016-10-26 ENCOUNTER — Encounter: Payer: Self-pay | Admitting: Internal Medicine

## 2016-10-26 DIAGNOSIS — F0281 Dementia in other diseases classified elsewhere with behavioral disturbance: Secondary | ICD-10-CM

## 2016-10-26 DIAGNOSIS — S0081XA Abrasion of other part of head, initial encounter: Secondary | ICD-10-CM | POA: Insufficient documentation

## 2016-10-26 DIAGNOSIS — F23 Brief psychotic disorder: Secondary | ICD-10-CM

## 2016-10-26 DIAGNOSIS — S0083XS Contusion of other part of head, sequela: Secondary | ICD-10-CM

## 2016-10-26 DIAGNOSIS — W19XXXD Unspecified fall, subsequent encounter: Secondary | ICD-10-CM | POA: Diagnosis not present

## 2016-10-26 DIAGNOSIS — G301 Alzheimer's disease with late onset: Secondary | ICD-10-CM

## 2016-10-26 DIAGNOSIS — S0081XD Abrasion of other part of head, subsequent encounter: Secondary | ICD-10-CM | POA: Diagnosis not present

## 2016-10-26 NOTE — Progress Notes (Signed)
Location:  [facility] Room Number: [place] of Service:  SNF ([phone]) Provider:  Blanchie Serve, MD  Blanchie Serve, MD  Patient Care Team: Blanchie Serve, MD as PCP - General (Internal Medicine) Stark Klein, MD as Consulting Physician (General Surgery) Magrinat, Virgie Dad, MD as Consulting Physician (Hematology and Oncology) Kyung Rudd, MD as Consulting Physician (Radiation Oncology) Luberta Mutter, MD as Consulting Physician (Ophthalmology) Mast, Man X, NP as Nurse Practitioner (Internal Medicine)  Extended Emergency Contact Information Primary Emergency Contact: [name],[name] Address: [address]          [place], [place] [address] [place] Phone: [phone] Work Phone: [phone] Relation: Daughter  Code Status:  DNR Goals of care: Advanced Directive information Advanced Directives 10/26/2016  Does Patient Have a Medical Advance Directive? Yes  Type of Paramedic of [place];Out of facility DNR (pink MOST or yellow form)  Does patient want to make changes to medical advance directive? No - Patient declined  Copy of [place] in Chart? Yes  Would patient like information on creating a medical advance directive? -  Pre-existing out of facility DNR order (yellow form or pink MOST form) Yellow form placed in chart (order not valid for inpatient use)     Chief Complaint  Patient presents with  . Acute Visit    Fall and change in behavior    HPI:  Pt is a 83 y.o. female seen today for an acute visit for fall. She had a fall on 10/25/16 and hit her face/ head. She did not lose consciousness but developed hematoma and abrasion. She was seen in the ED and had CT head and C-spine. No intracranial bleed or hematoma noted. No fracture was seen. She was sent back to the facility. She has had increased confusion since prior to fall per nursing. Today she mentions having delivered a baby boy and  that someone has taken him away. She will go home with her child and is looking for him. Per nursing she has increased behavior issues in the afternoon.    Past Medical History:  Diagnosis Date  . Abnormal weight gain   . Abnormality of gait   . Acute upper respiratory infections of unspecified site   . Anemia, unspecified   . Appendicitis   . Arthritis   . Chronic kidney disease, stage III (moderate)   . Dementia in conditions classified elsewhere without behavioral disturbance   . Depression   . Diverticulitis   . Diverticulosis of colon (without mention of hemorrhage)   . Dizziness and giddiness   . Edema of leg   . GERD (gastroesophageal reflux disease)   . Glaucoma   . Hyperlipidemia LDL goal < 100   . Hypertension   . Irritable bowel syndrome   . Irritable bowel syndrome   . Leukocytopenia, unspecified   . Malignant neoplasm of breast (female), unspecified site   . Memory loss   . Other B-complex deficiencies   . Other malaise and fatigue   . Other specified symptom associated with female genital organs   . Personal history of fall   . Polymyalgia rheumatica ([place])   . Regional enteritis of unspecified site   . Sciatica   . Sciatica   . Sebaceous cyst   . Unspecified arthropathy, shoulder region   . Unspecified glaucoma(365.9)   . Unspecified vitamin D deficiency   . Urge incontinence   . Vertigo    Past Surgical History:  Procedure  Laterality Date  . BREAST LUMPECTOMY  10/28/10   left breast   . BREAST SURGERY     R breast lumpectomy for benign disease  . CATARACT EXTRACTION  1990  . CHOLECYSTECTOMY  1972  . NASAL SINUS SURGERY  1966    Allergies  Allergen Reactions  . Codeine     States she blacks out. Listed on [facility]    Outpatient Encounter Prescriptions as of 10/26/2016  Medication Sig  . acetaminophen (TYLENOL) 500 MG tablet Take 1,000 mg by mouth 3 (three) times daily. 8AM, 1PM, 6PM  . bimatoprost (LUMIGAN) 0.01 % SOLN Place 1 drop into both eyes  at bedtime.   . brimonidine-timolol (COMBIGAN) 0.2-0.5 % ophthalmic solution Place 1 drop into both eyes 2 (two) times daily.   . calcium carbonate (TUMS - DOSED IN MG ELEMENTAL CALCIUM) 500 MG chewable tablet Chew 1 tablet by mouth daily.  . chlorhexidine (PERIDEX) 0.12 % solution Use as directed 15 mLs in the mouth or throat at bedtime. One teaspoon to brush teeth at bedtime   . citalopram (CELEXA) 20 MG tablet Take 20 mg by mouth daily.  . diclofenac sodium (VOLTAREN) 1 % GEL Apply 2 g topically 3 (three) times daily. Lower back  . diphenoxylate-atropine (LOMOTIL) 2.5-0.025 MG tablet Take 1 tablet by mouth 2 (two) times daily.  Marland Kitchen donepezil (ARICEPT) 10 MG tablet Take 10 mg by mouth at bedtime. Start on 10/01/16  . dorzolamide (TRUSOPT) 2 % ophthalmic solution Place 2 drops into both eyes 2 (two) times daily.  Marland Kitchen guaiFENesin-codeine (ROBAFEN AC) 100-10 MG/5ML syrup Take 4 mLs by mouth 4 (four) times daily as needed for cough or congestion.  . hyoscyamine (LEVSIN, ANASPAZ) 0.125 MG tablet Take 0.125 mg by mouth 3 (three) times daily as needed for cramping. Take at 8AM, 12PM, and 5PM to help relieve gas and abdominal discomfort.   . loperamide (IMODIUM A-D) 2 MG tablet Take 4 mg by mouth as directed. Give 4mg  initial dose, then 2mg  after each loose stool x48 hours with a maximum of 16mg  in 24 hours. If diarrhea persists, notify MD.   . Multiple Vitamins-Minerals (MULTIVITAMIN WITH MINERALS) tablet Take 1 tablet by mouth daily.  . QUEtiapine (SEROQUEL) 25 MG tablet Take 25 mg by mouth daily.   . [DISCONTINUED] LORazepam (ATIVAN) 2 MG/ML injection Inject 0.5 mg into the vein every 12 (twelve) hours as needed for anxiety.   No facility-administered encounter medications on file as of 10/26/2016.     Review of Systems  Reason unable to perform ROS: limited with her dementia and language barrier.   Constitutional: Negative for fever.  HENT: Negative for trouble swallowing.   Respiratory: Negative for  shortness of breath.   Cardiovascular: Negative for chest pain.  Gastrointestinal: Negative for abdominal pain, nausea and vomiting.  Genitourinary: Negative for dysuria.  Musculoskeletal: Positive for gait problem and neck pain.  Skin: Negative for wound.  Neurological: Negative for headaches.  Psychiatric/Behavioral: Positive for behavioral problems and confusion.    Immunization History  Administered Date(s) Administered  . Influenza,inj,Quad PF,6+ Mos 11/07/2012, 10/03/2013  . Influenza-Unspecified 12/30/2009, 11/17/2010, 11/30/2011  . Pneumococcal Conjugate-13 01/05/2014  . Pneumococcal Polysaccharide-23 01/30/2006   Pertinent  Health Maintenance Due  Topic Date Due  . INFLUENZA VACCINE  10/30/2016 (Originally 08/30/2016)  . DEXA SCAN  Completed  . PNA vac Low Risk Adult  Completed   Fall Risk  09/19/2016 05/29/2016 01/05/2014 10/03/2013 09/05/2012  Falls in the past year? Yes Yes Yes Yes Yes  Number falls in past yr: 1 2 or more 2 or more 2 or more -  Comment - - - no injury per patient -  Injury with Fall? No Yes - - -  Comment - back pain - - -  Risk Factor Category  - High Fall Risk - High Fall Risk -  Risk for fall due to : - - - History of fall(s);Impaired balance/gait;Impaired mobility;Impaired vision;Mental status change -   Functional Status Survey:    Vitals:   10/26/16 1140  BP: 140/68  Pulse: 70  Resp: 18  Temp: 98.1 F (36.7 C)  TempSrc: Oral  SpO2: 97%  Weight: 192 lb 14.4 oz (87.5 kg)  Height: 5\' 5"  (1.651 m)   Body mass index is 32.1 kg/m. Physical Exam  Constitutional:  Obese elderly female in no distress  HENT:  Head: Normocephalic.  Mouth/Throat: Oropharynx is clear and moist.  Abrasion to forehead, swelling to forehead has surprised.   Eyes: Pupils are equal, round, and reactive to light. Conjunctivae and EOM are normal.  Neck: Neck supple.  Cardiovascular: Normal rate and regular rhythm.   Pulmonary/Chest: Effort normal and breath sounds  normal. No respiratory distress. She has no wheezes. She has no rales.  Abdominal: Soft. Bowel sounds are normal. She exhibits no distension. There is no tenderness. There is no guarding.  Musculoskeletal:  On wheelchair, needs assistance with transfer, can move all 4 extremities, able to move her neck  Lymphadenopathy:    She has no cervical adenopathy.  Neurological: She is alert.  Oriented to self only  Skin: Skin is warm and dry. No rash noted.  Psychiatric:  Has visual and auditory hallucination.     Labs reviewed:  Recent Labs  07/06/16 2025 07/20/16 09/12/16 09/29/16  NA 141 139 145 141  K 4.9 4.5 4.4 4.7  CL 108  --   --   --   CO2 27  --   --   --   GLUCOSE 114*  --   --   --   BUN 26* 23* 22* 24*  CREATININE 1.18* 1.3* 1.2* 1.2*  CALCIUM 9.3  --   --   --     Recent Labs  07/06/16 2025 07/20/16 09/12/16  AST 18 14 15   ALT 15 10 10   ALKPHOS 75 66 58  BILITOT 0.7  --   --   PROT 7.4  --   --   ALBUMIN 3.9  --   --     Recent Labs  07/06/16 2025 07/20/16 09/12/16 09/29/16  WBC 4.1 4.9 3.0 2.7  NEUTROABS 2.6  --  38  --   HGB 10.5* 9.9* 9.3* 9.5*  HCT 33.7* 30* 29* 30*  MCV 99.1  --   --   --   PLT 201 245 175 108*   Lab Results  Component Value Date   TSH 2.03 05/09/2016   Lab Results  Component Value Date   HGBA1C 5.4 06/04/2014   Lab Results  Component Value Date   CHOL 193 06/04/2014   HDL 49 06/04/2014   LDLCALC 124 06/04/2014   TRIG 102 06/04/2014   CHOLHDL 2.7 06/27/2012    Significant Diagnostic Results in last 30 days:  Dg Chest 2 View  Result Date: 10/24/2016 CLINICAL DATA:  Unwitnessed fall at skilled nursing facility today. The patient is complaining of anterior chest discomfort in right shoulder discomfort. EXAM: CHEST  2 VIEW COMPARISON:  Chest x-ray of July 06, 2016 FINDINGS: The lungs  are adequately inflated and clear. The heart is top-normal in size. The pulmonary vascularity is normal. There is calcification in the wall of  the thoracic aorta. As best as can be determined the thoracic vertebral bodies are preserved in height. The observed ribs are unremarkable. IMPRESSION: No evidence of acute post traumatic injury of the thorax. Thoracic aortic atherosclerosis. Electronically Signed   By: David  Martinique M.D.   On: 10/24/2016 10:07   Dg Shoulder Right  Result Date: 10/24/2016 CLINICAL DATA:  Unwitnessed fall at skilled nursing facility today. The patient complains of right shoulder discomfort. EXAM: RIGHT SHOULDER - 2+ VIEW COMPARISON:  Right shoulder series of October 13, 2014 FINDINGS: Positioning of the shoulder on today's study is limited due to the patient's symptoms. There is considerable degenerative change of the glenohumeral joint. The joint spaces never seen non Foss. No acute fracture of the humeral head is observed. The coracoid and acromion are grossly intact. The observed portions of the right clavicle appear normal. There is considerable soft tissue calcification surrounding the glenohumeral joint. IMPRESSION: Chronic degenerative change of the glenohumeral joint. No objective evidence of an acute fracture or dislocation. Electronically Signed   By: David  Martinique M.D.   On: 10/24/2016 10:09   Ct Head Wo Contrast  Result Date: 10/24/2016 CLINICAL DATA:  Fall, frontal abrasions. EXAM: CT HEAD WITHOUT CONTRAST CT CERVICAL SPINE WITHOUT CONTRAST TECHNIQUE: Multidetector CT imaging of the head and cervical spine was performed following the standard protocol without intravenous contrast. Multiplanar CT image reconstructions of the cervical spine were also generated. COMPARISON:  10/17/2014 FINDINGS: CT HEAD FINDINGS Brain: There is atrophy and chronic small vessel disease changes. No acute intracranial abnormality. Specifically, no hemorrhage, hydrocephalus, mass lesion, acute infarction, or significant intracranial injury. Vascular: No hyperdense vessel or unexpected calcification. Skull: No acute calvarial  abnormality. Sinuses/Orbits: Postoperative changes in the right paranasal sinuses. Mucosal thickening in scattered paranasal sinuses. Mastoid air cells are clear. Orbital soft tissues unremarkable. Other: None CT CERVICAL SPINE FINDINGS Alignment: Normal Skull base and vertebrae: No fracture Soft tissues and spinal canal: Prevertebral soft tissues are normal. No epidural or paraspinal hematoma. Disc levels: Mild diffuse degenerative facet disease. Early degenerative disc disease in the lower cervical spine with anterior spurring. Upper chest: Negative Other: None IMPRESSION: No acute intracranial abnormality. Atrophy, chronic microvascular disease. Degenerative changes in the cervical spine. No acute bony abnormality. Electronically Signed   By: Rolm Baptise M.D.   On: 10/24/2016 10:14   Ct Cervical Spine Wo Contrast  Result Date: 10/24/2016 CLINICAL DATA:  Fall, frontal abrasions. EXAM: CT HEAD WITHOUT CONTRAST CT CERVICAL SPINE WITHOUT CONTRAST TECHNIQUE: Multidetector CT imaging of the head and cervical spine was performed following the standard protocol without intravenous contrast. Multiplanar CT image reconstructions of the cervical spine were also generated. COMPARISON:  10/17/2014 FINDINGS: CT HEAD FINDINGS Brain: There is atrophy and chronic small vessel disease changes. No acute intracranial abnormality. Specifically, no hemorrhage, hydrocephalus, mass lesion, acute infarction, or significant intracranial injury. Vascular: No hyperdense vessel or unexpected calcification. Skull: No acute calvarial abnormality. Sinuses/Orbits: Postoperative changes in the right paranasal sinuses. Mucosal thickening in scattered paranasal sinuses. Mastoid air cells are clear. Orbital soft tissues unremarkable. Other: None CT CERVICAL SPINE FINDINGS Alignment: Normal Skull base and vertebrae: No fracture Soft tissues and spinal canal: Prevertebral soft tissues are normal. No epidural or paraspinal hematoma. Disc levels:  Mild diffuse degenerative facet disease. Early degenerative disc disease in the lower cervical spine with anterior spurring. Upper chest: Negative  Other: None IMPRESSION: No acute intracranial abnormality. Atrophy, chronic microvascular disease. Degenerative changes in the cervical spine. No acute bony abnormality. Electronically Signed   By: Rolm Baptise M.D.   On: 10/24/2016 10:14    Assessment/Plan  Acute psychosis Has paranoia and hallucination. Likely with advanced dementia. Rule out infection and metabolic etiology. Increase dosing of seroquel for now and monitor. Supportive care.   Dementia with behavioral disturbance Progressive with worsening behavior. Will change her seroquel to 50 mg daily at 2 pm. Continue donepezil and citalopram.   Fall subsequent encounter Hit her head, bleed and intracranial abnormalities ruled out. High fall risk. Tylenol as needed for pain. Continue with neurochecks.   Abrasion to forehead Post fall. Denies pain. Monitor for now. Continue tylenol for pain.   Hematoma to forehead Has resolved. No headache.     Family/ staff Communication: reviewed care plan with patient and charge nurse.   Labs/tests ordered:  Cbc with diff, cmp  Blanchie Serve, MD Internal Medicine [facility] [address] [place], [place] [address] Cell Phone (Monday-Friday 8 am - 5 pm): [phone] On Call: [phone] and follow prompts after 5 pm and on weekends Office Phone: [phone] Office Fax: [phone]

## 2016-10-31 DIAGNOSIS — N183 Chronic kidney disease, stage 3 (moderate): Secondary | ICD-10-CM | POA: Diagnosis not present

## 2016-10-31 DIAGNOSIS — F323 Major depressive disorder, single episode, severe with psychotic features: Secondary | ICD-10-CM | POA: Diagnosis not present

## 2016-10-31 DIAGNOSIS — N179 Acute kidney failure, unspecified: Secondary | ICD-10-CM | POA: Diagnosis not present

## 2016-10-31 DIAGNOSIS — I10 Essential (primary) hypertension: Secondary | ICD-10-CM | POA: Diagnosis not present

## 2016-10-31 LAB — CBC AND DIFFERENTIAL
HCT: 33 — AB (ref 36–46)
HEMOGLOBIN: 10.3 — AB (ref 12.0–16.0)
PLATELETS: 192 (ref 150–399)
WBC: 3.8

## 2016-10-31 LAB — BASIC METABOLIC PANEL
BUN: 24 — AB (ref 4–21)
Creatinine: 1.1 (ref <=1.1)
GLUCOSE: 94
Potassium: 4.2 (ref 3.4–5.3)
Sodium: 145 (ref 137–147)

## 2016-10-31 LAB — HEPATIC FUNCTION PANEL
ALT: 10 (ref 7–35)
AST: 15 (ref 13–35)
Alkaline Phosphatase: 71 (ref 25–125)
Bilirubin, Total: 0.4

## 2016-11-02 ENCOUNTER — Other Ambulatory Visit: Payer: Self-pay | Admitting: *Deleted

## 2016-11-16 ENCOUNTER — Encounter: Payer: Self-pay | Admitting: Internal Medicine

## 2016-11-16 ENCOUNTER — Non-Acute Institutional Stay (SKILLED_NURSING_FACILITY): Payer: Medicare Other | Admitting: Internal Medicine

## 2016-11-16 DIAGNOSIS — M159 Polyosteoarthritis, unspecified: Secondary | ICD-10-CM | POA: Insufficient documentation

## 2016-11-16 DIAGNOSIS — F0280 Dementia in other diseases classified elsewhere without behavioral disturbance: Secondary | ICD-10-CM | POA: Diagnosis not present

## 2016-11-16 DIAGNOSIS — I10 Essential (primary) hypertension: Secondary | ICD-10-CM

## 2016-11-16 DIAGNOSIS — D638 Anemia in other chronic diseases classified elsewhere: Secondary | ICD-10-CM | POA: Diagnosis not present

## 2016-11-16 DIAGNOSIS — G309 Alzheimer's disease, unspecified: Secondary | ICD-10-CM | POA: Diagnosis not present

## 2016-11-16 DIAGNOSIS — H409 Unspecified glaucoma: Secondary | ICD-10-CM

## 2016-11-16 DIAGNOSIS — F0150 Vascular dementia without behavioral disturbance: Secondary | ICD-10-CM | POA: Diagnosis not present

## 2016-11-16 NOTE — Progress Notes (Signed)
[facility]  Provider: Blanchie Serve MD   Location:  [facility] Room Number: 60-A Place of Service:  SNF (31)  PCP: Blanchie Serve, MD Patient Care Team: Blanchie Serve, MD as PCP - General (Internal Medicine) Stark Klein, MD as Consulting Physician (General Surgery) Magrinat, Virgie Dad, MD as Consulting Physician (Hematology and Oncology) Kyung Rudd, MD as Consulting Physician (Radiation Oncology) Luberta Mutter, MD as Consulting Physician (Ophthalmology) Mast, Man X, NP as Nurse Practitioner (Internal Medicine)  Extended Emergency Contact Information Primary Emergency Contact: [name],[name] Address: [address]          [place], [place] [address] Johnnette Litter of [place] Phone: [phone] Work Phone: [phone] Relation: Daughter  Code Status:  DNR Goals of Care: Advanced Directive information Advanced Directives 11/16/2016  Does Patient Have a Medical Advance Directive? Yes  Type of Paramedic of [place];Out of facility DNR (pink MOST or yellow form)  Does patient want to make changes to medical advance directive? No - Patient declined  Copy of [place] in Chart? Yes  Would patient like information on creating a medical advance directive? -  Pre-existing out of facility DNR order (yellow form or pink MOST form) Yellow form placed in chart (order not valid for inpatient use)      Chief Complaint  Patient presents with  . Medical Management of Chronic Issues    Routine Visit     HPI: Patient is a 83 y.o. [name] seen today for routine visit. Her behavior during daytime has improved per nursing. She participates some in activities. She needs assistance setting up her meals and is followed with aspiration precautions. During evening times there has been episodes of anxiety and restlessness. Has been refusing care at times.    Past Medical History:  Diagnosis Date  .  Abnormal weight gain   . Abnormality of gait   . Acute upper respiratory infections of unspecified site   . Anemia, unspecified   . Appendicitis   . Arthritis   . Chronic kidney disease, stage III (moderate) (HCC)   . Dementia in conditions classified elsewhere without behavioral disturbance   . Depression   . Diverticulitis   . Diverticulosis of colon (without mention of hemorrhage)   . Dizziness and giddiness   . Edema of leg   . GERD (gastroesophageal reflux disease)   . Glaucoma   . Hyperlipidemia LDL goal < 100   . Hypertension   . Irritable bowel syndrome   . Irritable bowel syndrome   . Leukocytopenia, unspecified   . Malignant neoplasm of breast ([name]), unspecified site   . Memory loss   . Other B-complex deficiencies   . Other malaise and fatigue   . Other specified symptom associated with [name] genital organs   . Personal history of fall   . Polymyalgia rheumatica ([place])   . Regional enteritis of unspecified site   . Sciatica   . Sciatica   . Sebaceous cyst   . Unspecified arthropathy, shoulder region   . Unspecified glaucoma(365.9)   . Unspecified vitamin D deficiency   . Urge incontinence   . Vertigo    Past Surgical History:  Procedure Laterality Date  . BREAST LUMPECTOMY  9/Michelle/12   left breast   . BREAST SURGERY     R breast lumpectomy for benign disease  . CATARACT EXTRACTION  1990  . CHOLECYSTECTOMY  1972  . NASAL SINUS SURGERY  1966  reports that she has never smoked. She has never used smokeless tobacco. She reports that she does not drink alcohol or use drugs. Social History   Social History  . Marital status: Widowed    Spouse name: N/A  . Number of children: N/A  . Years of education: N/A   Occupational History  . retired    Social History Main Topics  . Smoking status: Never Smoker  . Smokeless tobacco: Never Used  . Alcohol use No  . Drug use: No  . Sexual activity: No   Other Topics Concern  . Not on file   Social  History Narrative  . No narrative on file    Functional Status Survey:    Family History  Problem Relation Age of Onset  . Stroke Father   . Hypertension Mother   . Stroke Mother   . Breast cancer Mother   . Heart attack Brother   . Cancer Brother   . Stroke Brother   . Heart attack Son   . Colon cancer Sister     Health Maintenance  Topic Date Due  . INFLUENZA VACCINE  08/30/2016  . TETANUS/TDAP  01/30/2017 (Originally 03/16/1939)  . DEXA SCAN  Completed  . PNA vac Low Risk Adult  Completed    Allergies  Allergen Reactions  . Codeine     States she blacks out. Listed on [facility]    Outpatient Encounter Prescriptions as of 11/16/2016  Medication Sig  . acetaminophen (TYLENOL) 500 MG tablet Take 1,000 mg by mouth 3 (three) times daily. 8AM, 1PM, 6PM  . bimatoprost (LUMIGAN) 0.01 % SOLN Place 1 drop into both eyes at bedtime.   . brimonidine-timolol (COMBIGAN) 0.2-0.5 % ophthalmic solution Place 1 drop into both eyes 2 (two) times daily.   . calcium carbonate (TUMS - DOSED IN MG ELEMENTAL CALCIUM) 500 MG chewable tablet Chew 1 tablet by mouth daily.  . chlorhexidine (PERIDEX) 0.12 % solution Use as directed 15 mLs in the mouth or throat at bedtime. One teaspoon to brush teeth at bedtime   . citalopram (CELEXA) 20 MG tablet Take 20 mg by mouth daily.  . diclofenac sodium (VOLTAREN) 1 % GEL Apply 2 g topically 3 (three) times daily. Lower back  . diphenoxylate-atropine (LOMOTIL) 2.5-0.025 MG tablet Take 1 tablet by mouth 2 (two) times daily.  Marland Kitchen donepezil (ARICEPT) 10 MG tablet Take 10 mg by mouth at bedtime. Start on 10/01/16  . dorzolamide (TRUSOPT) 2 % ophthalmic solution Place 2 drops into both eyes 2 (two) times daily.  Marland Kitchen guaiFENesin-codeine (ROBAFEN AC) 100-10 MG/5ML syrup Take 4 mLs by mouth 4 (four) times daily as needed for cough or congestion.  . hyoscyamine (LEVSIN, ANASPAZ) 0.125 MG tablet Take 0.125 mg by mouth 3 (three) times daily as needed for cramping. Take at 8AM,  12PM, and 5PM to help relieve gas and abdominal discomfort.   . loperamide (IMODIUM A-D) 2 MG tablet Take 4 mg by mouth as directed. Give 4mg  initial dose, then 2mg  after each loose stool x48 hours with a maximum of 16mg  in 24 hours. If diarrhea persists, notify MD.   . Multiple Vitamins-Minerals (MULTIVITAMIN WITH MINERALS) tablet Take 1 tablet by mouth daily.  . QUEtiapine (SEROQUEL) 50 MG tablet Take 50 mg by mouth at bedtime.  . [DISCONTINUED] QUEtiapine (SEROQUEL) 25 MG tablet Take 25 mg by mouth daily.    No facility-administered encounter medications on file as of 11/16/2016.     Review of Systems  Reason unable to  perform ROS: limited with her dementia and aphasia.  Constitutional: Negative for diaphoresis and fever.  HENT: Positive for trouble swallowing. Negative for congestion.   Eyes: Positive for visual disturbance.       Has corrective glasses  Respiratory: Negative for cough and shortness of breath.   Cardiovascular: Positive for leg swelling. Negative for chest pain and palpitations.       Has ted hose  Gastrointestinal: Negative for abdominal pain, nausea and vomiting.       Moved her bowels today  Genitourinary: Negative for dysuria.       Has urinary incontinence  Musculoskeletal: Positive for gait problem.       Needs 2 person assistance with transfer. Hoyer lift for transfer  Skin: Negative for rash and wound.  Neurological: Positive for speech difficulty. Negative for tremors, seizures and headaches.  Hematological: Bruises/bleeds easily.  Psychiatric/Behavioral: Positive for behavioral problems and confusion.    Vitals:   11/16/16 0918  BP: 136/68  Pulse: 68  Resp: 18  Temp: 98.1 F (36.7 C)  TempSrc: Oral  SpO2: 97%  Weight: 193 lb (87.5 kg)  Height: 5\' 5"  (1.651 m)   Body mass index is 32.12 kg/m.   Wt Readings from Last 3 Encounters:  11/16/16 193 lb (87.5 kg)  10/26/16 192 lb 14.4 oz (87.5 kg)  10/16/16 192 lb 14.4 oz (87.5 kg)    Physical  Exam  Constitutional: She appears well-developed. No distress.  obese  HENT:  Head: Normocephalic and atraumatic.  Mouth/Throat: Oropharynx is clear and moist.  Eyes: Pupils are equal, round, and reactive to light. Conjunctivae and EOM are normal.  Neck: Normal range of motion. Neck supple. No thyromegaly present.  Cardiovascular: Normal rate and regular rhythm.   Pulmonary/Chest: Effort normal and breath sounds normal. She has no wheezes. She has no rales.  Abdominal: Soft. Bowel sounds are normal. There is no tenderness. There is no guarding.  Musculoskeletal: She exhibits edema and deformity.  Arthritis changes to fingers, trace leg edema, can move all 4 extremities, on wheelchair and needs assistance with transfer  Lymphadenopathy:    She has no cervical adenopathy.  Neurological: She is alert.  Skin: Skin is warm and dry. No rash noted. She is not diaphoretic.  Chronic stasis changes to her legs.   Psychiatric:  Pleasantly confused, calm this visit    Labs reviewed: Basic Metabolic Panel:  Recent Labs  07/06/16 2025  09/12/16 09/29/16 10/31/16  NA 141  < > 145 141 145  K 4.9  < > 4.4 4.7 4.2  CL 108  --   --   --   --   CO2 27  --   --   --   --   GLUCOSE 114*  --   --   --   --   BUN 26*  < > 22* 24* 24*  CREATININE 1.18*  < > 1.2* 1.2* 1.1  CALCIUM 9.3  --   --   --   --   < > = values in this interval not displayed. Liver Function Tests:  Recent Labs  07/06/16 2025 07/20/16 09/12/16 10/31/16  AST 18 14 15 15   ALT 15 10 10 10   ALKPHOS 75 66 58 71  BILITOT 0.7  --   --   --   PROT 7.4  --   --   --   ALBUMIN 3.9  --   --   --    No results for input(s): LIPASE, AMYLASE  in the last 8760 hours. No results for input(s): AMMONIA in the last 8760 hours. CBC:  Recent Labs  07/06/16 2025  09/12/16 09/29/16 10/31/16  WBC 4.1  < > 3.0 2.7 3.8  NEUTROABS 2.6  --  38  --   --   HGB 10.5*  < > 9.3* 9.5* 10.3*  HCT 33.7*  < > 29* 30* 33*  MCV 99.1  --   --   --    --   PLT 201  < > 175 108* 192  < > = values in this interval not displayed. Cardiac Enzymes: No results for input(s): CKTOTAL, CKMB, CKMBINDEX, TROPONINI in the last 8760 hours. BNP: Invalid input(s): POCBNP Lab Results  Component Value Date   HGBA1C 5.4 06/04/2014   Lab Results  Component Value Date   TSH 2.03 05/09/2016   No results found for: VITAMINB12 No results found for: FOLATE No results found for: IRON, TIBC, FERRITIN  Lipid Panel: No results for input(s): CHOL, HDL, LDLCALC, TRIG, CHOLHDL, LDLDIRECT in the last 8760 hours. Lab Results  Component Value Date   HGBA1C 5.4 06/04/2014    Procedures since last visit: Dg Chest 2 View  Result Date: 10/24/2016 CLINICAL DATA:  Unwitnessed fall at skilled nursing facility today. The patient is complaining of anterior chest discomfort in right shoulder discomfort. EXAM: CHEST  2 VIEW COMPARISON:  Chest x-ray of July 06, 2016 FINDINGS: The lungs are adequately inflated and clear. The heart is top-normal in size. The pulmonary vascularity is normal. There is calcification in the wall of the thoracic aorta. As best as can be determined the thoracic vertebral bodies are preserved in height. The observed ribs are unremarkable. IMPRESSION: No evidence of acute post traumatic injury of the thorax. Thoracic aortic atherosclerosis. Electronically Signed   By: David  Martinique M.D.   On: 10/24/2016 10:07   Dg Shoulder Right  Result Date: 10/24/2016 CLINICAL DATA:  Unwitnessed fall at skilled nursing facility today. The patient complains of right shoulder discomfort. EXAM: RIGHT SHOULDER - 2+ VIEW COMPARISON:  Right shoulder series of October 13, 2014 FINDINGS: Positioning of the shoulder on today's study is limited due to the patient's symptoms. There is considerable degenerative change of the glenohumeral joint. The joint spaces never seen non Foss. No acute fracture of the humeral head is observed. The coracoid and acromion are grossly intact.  The observed portions of the right clavicle appear normal. There is considerable soft tissue calcification surrounding the glenohumeral joint. IMPRESSION: Chronic degenerative change of the glenohumeral joint. No objective evidence of an acute fracture or dislocation. Electronically Signed   By: David  Martinique M.D.   On: 10/24/2016 10:09   Ct Head Wo Contrast  Result Date: 10/24/2016 CLINICAL DATA:  Fall, frontal abrasions. EXAM: CT HEAD WITHOUT CONTRAST CT CERVICAL SPINE WITHOUT CONTRAST TECHNIQUE: Multidetector CT imaging of the head and cervical spine was performed following the standard protocol without intravenous contrast. Multiplanar CT image reconstructions of the cervical spine were also generated. COMPARISON:  10/17/2014 FINDINGS: CT HEAD FINDINGS Brain: There is atrophy and chronic small vessel disease changes. No acute intracranial abnormality. Specifically, no hemorrhage, hydrocephalus, mass lesion, acute infarction, or significant intracranial injury. Vascular: No hyperdense vessel or unexpected calcification. Skull: No acute calvarial abnormality. Sinuses/Orbits: Postoperative changes in the right paranasal sinuses. Mucosal thickening in scattered paranasal sinuses. Mastoid air cells are clear. Orbital soft tissues unremarkable. Other: None CT CERVICAL SPINE FINDINGS Alignment: Normal Skull base and vertebrae: No fracture Soft tissues and spinal canal:  Prevertebral soft tissues are normal. No epidural or paraspinal hematoma. Disc levels: Mild diffuse degenerative facet disease. Early degenerative disc disease in the lower cervical spine with anterior spurring. Upper chest: Negative Other: None IMPRESSION: No acute intracranial abnormality. Atrophy, chronic microvascular disease. Degenerative changes in the cervical spine. No acute bony abnormality. Electronically Signed   By: Rolm Baptise M.D.   On: 10/24/2016 10:14   Ct Cervical Spine Wo Contrast  Result Date: 10/24/2016 CLINICAL DATA:  Fall,  frontal abrasions. EXAM: CT HEAD WITHOUT CONTRAST CT CERVICAL SPINE WITHOUT CONTRAST TECHNIQUE: Multidetector CT imaging of the head and cervical spine was performed following the standard protocol without intravenous contrast. Multiplanar CT image reconstructions of the cervical spine were also generated. COMPARISON:  10/17/2014 FINDINGS: CT HEAD FINDINGS Brain: There is atrophy and chronic small vessel disease changes. No acute intracranial abnormality. Specifically, no hemorrhage, hydrocephalus, mass lesion, acute infarction, or significant intracranial injury. Vascular: No hyperdense vessel or unexpected calcification. Skull: No acute calvarial abnormality. Sinuses/Orbits: Postoperative changes in the right paranasal sinuses. Mucosal thickening in scattered paranasal sinuses. Mastoid air cells are clear. Orbital soft tissues unremarkable. Other: None CT CERVICAL SPINE FINDINGS Alignment: Normal Skull base and vertebrae: No fracture Soft tissues and spinal canal: Prevertebral soft tissues are normal. No epidural or paraspinal hematoma. Disc levels: Mild diffuse degenerative facet disease. Early degenerative disc disease in the lower cervical spine with anterior spurring. Upper chest: Negative Other: None IMPRESSION: No acute intracranial abnormality. Atrophy, chronic microvascular disease. Degenerative changes in the cervical spine. No acute bony abnormality. Electronically Signed   By: Rolm Baptise M.D.   On: 10/24/2016 10:14    Assessment/Plan  Alzheimer's and Vascular dementia with behavioral disturbance Has ongoing behavioral issues, improved some from before. Currently on seroquel 50 mg qhs. change this to 25 mg in am and 50 mg in pm and monitor. Continue with supportive care. Continue donepezil. Continue citalopram 20 mg daily.   Anemia of chronic disease Has ckd stage 3. Check ferritin level. Start ferrous sulfate 325 mg daily and monitor cbc periodically.  OA Continue acetaminophen 1000 mg  tid. Continue calcium supplement.   Hypertension Controlled reading. Off amlodipine and BP has been good. Monitor clinically  Glaucoma Continue her eye drops   Labs/tests ordered:  ferritin   Blanchie Serve, MD Internal Medicine [facility] [address] [place], [place] [address] Cell Phone (Monday-Friday 8 am - 5 pm): [phone] On Call: [phone] and follow prompts after 5 pm and on weekends Office Phone: [phone] Office Fax: [phone]

## 2016-11-28 DIAGNOSIS — N179 Acute kidney failure, unspecified: Secondary | ICD-10-CM | POA: Diagnosis not present

## 2016-11-28 DIAGNOSIS — I10 Essential (primary) hypertension: Secondary | ICD-10-CM | POA: Diagnosis not present

## 2016-11-28 DIAGNOSIS — E785 Hyperlipidemia, unspecified: Secondary | ICD-10-CM | POA: Diagnosis not present

## 2016-11-28 DIAGNOSIS — E162 Hypoglycemia, unspecified: Secondary | ICD-10-CM | POA: Diagnosis not present

## 2016-11-28 LAB — LIPID PANEL
CHOLESTEROL: 157 (ref 0–200)
HDL: 55 (ref 35–70)
LDL Cholesterol: 85
LDL/HDL RATIO: 2.9
Triglycerides: 79 (ref 40–160)

## 2016-11-28 LAB — HEMOGLOBIN A1C: Hemoglobin A1C: 5.2

## 2016-11-29 ENCOUNTER — Other Ambulatory Visit: Payer: Self-pay | Admitting: *Deleted

## 2016-11-29 ENCOUNTER — Encounter: Payer: Self-pay | Admitting: Nurse Practitioner

## 2016-12-18 ENCOUNTER — Non-Acute Institutional Stay (SKILLED_NURSING_FACILITY): Payer: Medicare Other | Admitting: Nurse Practitioner

## 2016-12-18 ENCOUNTER — Encounter: Payer: Self-pay | Admitting: Nurse Practitioner

## 2016-12-18 DIAGNOSIS — F0280 Dementia in other diseases classified elsewhere without behavioral disturbance: Secondary | ICD-10-CM

## 2016-12-18 DIAGNOSIS — D638 Anemia in other chronic diseases classified elsewhere: Secondary | ICD-10-CM

## 2016-12-18 DIAGNOSIS — F323 Major depressive disorder, single episode, severe with psychotic features: Secondary | ICD-10-CM | POA: Diagnosis not present

## 2016-12-18 DIAGNOSIS — N183 Chronic kidney disease, stage 3 unspecified: Secondary | ICD-10-CM

## 2016-12-18 DIAGNOSIS — G309 Alzheimer's disease, unspecified: Secondary | ICD-10-CM

## 2016-12-18 DIAGNOSIS — F0150 Vascular dementia without behavioral disturbance: Secondary | ICD-10-CM

## 2016-12-18 NOTE — Assessment & Plan Note (Addendum)
resides in SNF, w/c dependent, two person transfer, continue Donepezil 10mg  daily for memory.

## 2016-12-18 NOTE — Assessment & Plan Note (Signed)
Continue Ferrous sulfate 325mg  qd, Hgb 9.5 09/29/16, ferritin 98 11/21/16, update CBC

## 2016-12-18 NOTE — Assessment & Plan Note (Signed)
He mood is stable, continue Citalopram 20mg  qd, psychosis, stable, continue Seroquel 25mg  am, 50mg  pm, Hgb a1c 5.2 11/29/16.

## 2016-12-18 NOTE — Assessment & Plan Note (Signed)
last Bun 24, creat 1.21 09/29/16, update CMP

## 2016-12-18 NOTE — Progress Notes (Signed)
Location:  [facility] Room Number: 60-A Place of Service:  SNF (31) Provider:  Jazzma Neidhardt, Manxie  NP  Blanchie Serve, MD  Patient Care Team: Blanchie Serve, MD as PCP - General (Internal Medicine) Stark Klein, MD as Consulting Physician (General Surgery) Magrinat, Virgie Dad, MD as Consulting Physician (Hematology and Oncology) Kyung Rudd, MD as Consulting Physician (Radiation Oncology) Luberta Mutter, MD as Consulting Physician (Ophthalmology) Thersa Mohiuddin X, NP as Nurse Practitioner (Internal Medicine)  Extended Emergency Contact Information Primary Emergency Contact: [name],[name] Address: [address]          [place], [place] [address] [place] Phone: [phone] Work Phone: [phone] Relation: Daughter  Code Status:  DNR Goals of care: Advanced Directive information Advanced Directives 11/16/2016  Does Patient Have a Medical Advance Directive? Yes  Type of Paramedic of [place];Out of facility DNR (pink MOST or yellow form)  Does patient want to make changes to medical advance directive? No - Patient declined  Copy of [place] in Chart? Yes  Would patient like information on creating a medical advance directive? -  Pre-existing out of facility DNR order (yellow form or pink MOST form) Yellow form placed in chart (order not valid for inpatient use)     Chief Complaint  Patient presents with  . Medical Management of Chronic Issues    HPI:  Pt is a 83 y.o. female seen today for medical management of chronic diseases.     The patient has history of psychosis, stable while on Seroquel 25mg  am, 50mg  pm, Hgb a1c 5.2 11/29/16.  Her anemia is manage with Ferrous sulfate 325mg  qd, Hgb 9.5 09/29/16, ferritin 98 11/21/16. Dementia, resides in SNF, w/c dependent, two person transfer, taking Donepezil 10mg  daily for memory. He mood is stable on Citalopram 20mg  qd. CKD last Bun 24, creat 1.21  09/29/16.    Past Medical History:  Diagnosis Date  . Abnormal weight gain   . Abnormality of gait   . Acute upper respiratory infections of unspecified site   . Anemia, unspecified   . Appendicitis   . Arthritis   . Chronic kidney disease, stage III (moderate) (HCC)   . Dementia in conditions classified elsewhere without behavioral disturbance   . Depression   . Diverticulitis   . Diverticulosis of colon (without mention of hemorrhage)   . Dizziness and giddiness   . Edema of leg   . GERD (gastroesophageal reflux disease)   . Glaucoma   . Hyperlipidemia LDL goal < 100   . Hypertension   . Irritable bowel syndrome   . Irritable bowel syndrome   . Leukocytopenia, unspecified   . Malignant neoplasm of breast (female), unspecified site   . Memory loss   . Other B-complex deficiencies   . Other malaise and fatigue   . Other specified symptom associated with female genital organs   . Personal history of fall   . Polymyalgia rheumatica ([place])   . Regional enteritis of unspecified site   . Sciatica   . Sciatica   . Sebaceous cyst   . Unspecified arthropathy, shoulder region   . Unspecified glaucoma(365.9)   . Unspecified vitamin D deficiency   . Urge incontinence   . Vertigo    Past Surgical History:  Procedure Laterality Date  . BREAST LUMPECTOMY  10/28/10   left breast   . BREAST SURGERY     R breast lumpectomy for benign disease  . CATARACT EXTRACTION  1990  .  CHOLECYSTECTOMY  1972  . NASAL SINUS SURGERY  1966    Allergies  Allergen Reactions  . Codeine     States she blacks out. Listed on [facility]    Outpatient Encounter Medications as of 12/18/2016  Medication Sig  . acetaminophen (TYLENOL) 500 MG tablet Take 1,000 mg by mouth 3 (three) times daily. 8AM, 1PM, 6PM  . bimatoprost (LUMIGAN) 0.01 % SOLN Place 1 drop into both eyes at bedtime.   . brimonidine-timolol (COMBIGAN) 0.2-0.5 % ophthalmic solution Place 1 drop into both eyes 2 (two) times daily.   . calcium  carbonate (TUMS - DOSED IN MG ELEMENTAL CALCIUM) 500 MG chewable tablet Chew 1 tablet by mouth daily.  . chlorhexidine (PERIDEX) 0.12 % solution Use as directed 15 mLs in the mouth or throat at bedtime. One teaspoon to brush teeth at bedtime   . citalopram (CELEXA) 20 MG tablet Take 20 mg by mouth daily.  . diclofenac sodium (VOLTAREN) 1 % GEL Apply 2 g topically 3 (three) times daily. Lower back  . diphenoxylate-atropine (LOMOTIL) 2.5-0.025 MG tablet Take 1 tablet by mouth 2 (two) times daily.  Marland Kitchen donepezil (ARICEPT) 10 MG tablet Take 10 mg by mouth at bedtime. Start on 10/01/16  . dorzolamide (TRUSOPT) 2 % ophthalmic solution Place 2 drops into both eyes 2 (two) times daily.  . ferrous sulfate 325 (65 FE) MG tablet Take 325 mg daily with breakfast by mouth.  Marland Kitchen guaiFENesin-codeine (ROBAFEN AC) 100-10 MG/5ML syrup Take 4 mLs by mouth 4 (four) times daily as needed for cough or congestion.  . hyoscyamine (LEVSIN, ANASPAZ) 0.125 MG tablet Take 0.125 mg by mouth 3 (three) times daily as needed for cramping. Take at 8AM, 12PM, and 5PM to help relieve gas and abdominal discomfort.   . loperamide (IMODIUM A-D) 2 MG tablet Take 4 mg by mouth as directed. Give 4mg  initial dose, then 2mg  after each loose stool x48 hours with a maximum of 16mg  in 24 hours. If diarrhea persists, notify MD.   . Multiple Vitamins-Minerals (MULTIVITAMIN WITH MINERALS) tablet Take 1 tablet by mouth daily.  . QUEtiapine (SEROQUEL) 50 MG tablet Take 50 mg by mouth at bedtime.   No facility-administered encounter medications on file as of 12/18/2016.    ROS was provided with assistance of staff Review of Systems  Constitutional: Negative for activity change, appetite change, chills, diaphoresis, fatigue and fever.  HENT: Positive for hearing loss. Negative for congestion, trouble swallowing and voice change.   Eyes: Negative for visual disturbance.       Corrective lenses  Respiratory: Negative for cough, choking, chest tightness,  shortness of breath and wheezing.   Cardiovascular: Positive for leg swelling. Negative for chest pain and palpitations.       Trace edema in legs, wearing compression hosiery.   Gastrointestinal: Negative for abdominal distention, abdominal pain, constipation, diarrhea, nausea and vomiting.  Genitourinary: Negative for difficulty urinating, dysuria and urgency.       Incontinent urine.   Musculoskeletal: Positive for gait problem.       Wheelchair dependent, two person transfer.   Skin: Negative for pallor, rash and wound.  Neurological: Negative for dizziness, tremors, speech difficulty and weakness.       Dementia.   Psychiatric/Behavioral: Positive for confusion. Negative for agitation, behavioral problems, hallucinations and sleep disturbance. The patient is not nervous/anxious.     Immunization History  Administered Date(s) Administered  . Influenza,inj,Quad PF,6+ Mos 11/07/2012, 10/03/2013  . Influenza-Unspecified 12/30/2009, 11/17/2010, 11/30/2011, 11/16/2016  . Pneumococcal  Conjugate-13 01/05/2014  . Pneumococcal Polysaccharide-23 01/30/2006   Pertinent  Health Maintenance Due  Topic Date Due  . INFLUENZA VACCINE  Completed  . DEXA SCAN  Completed  . PNA vac Low Risk Adult  Completed   Fall Risk  09/19/2016 05/29/2016 01/05/2014 10/03/2013 09/05/2012  Falls in the past year? Yes Yes Yes Yes Yes  Number falls in past yr: 1 2 or more 2 or more 2 or more -  Comment - - - no injury per patient -  Injury with Fall? No Yes - - -  Comment - back pain - - -  Risk Factor Category  - High Fall Risk - High Fall Risk -  Risk for fall due to : - - - History of fall(s);Impaired balance/gait;Impaired mobility;Impaired vision;Mental status change -   Functional Status Survey:    Vitals:   12/18/16 1407  BP: 140/88  Pulse: 70  Resp: (!) 22  Temp: 98.3 F (36.8 C)  Weight: 187 lb 11.2 oz (85.1 kg)  Height: 5\' 5"  (1.651 m)   Body mass index is 31.23 kg/m. Physical Exam    Constitutional: She appears well-developed and well-nourished. No distress.  HENT:  Head: Normocephalic and atraumatic.  Eyes: Conjunctivae and EOM are normal. Pupils are equal, round, and reactive to light.  Neck: Normal range of motion. Neck supple. No JVD present. No thyromegaly present.  Cardiovascular: Normal rate, regular rhythm and normal heart sounds.  No murmur heard. Pulmonary/Chest: Effort normal and breath sounds normal. She has no wheezes. She has no rales.  Abdominal: Soft. Bowel sounds are normal. She exhibits no distension. There is no tenderness.  Musculoskeletal: Normal range of motion. She exhibits edema. She exhibits no tenderness.  Two person transfer, wheelchair dependent. Trace edema in BLE, has compression hosiery on  Neurological: She is alert. She exhibits normal muscle tone. Coordination normal.  Oriented to self and her room on unit.   Skin: Skin is warm and dry. No rash noted. She is not diaphoretic. No pallor.  Psychiatric: She has a normal mood and affect. Her behavior is normal.    Labs reviewed: Recent Labs    07/06/16 2025  09/12/16 09/29/16 10/31/16  NA 141   < > 145 141 145  K 4.9   < > 4.4 4.7 4.2  CL 108  --   --   --   --   CO2 27  --   --   --   --   GLUCOSE 114*  --   --   --   --   BUN 26*   < > 22* 24* 24*  CREATININE 1.18*   < > 1.2* 1.2* 1.1  CALCIUM 9.3  --   --   --   --    < > = values in this interval not displayed.   Recent Labs    07/06/16 2025 07/20/16 09/12/16 10/31/16  AST 18 14 15 15   ALT 15 10 10 10   ALKPHOS 75 66 58 71  BILITOT 0.7  --   --   --   PROT 7.4  --   --   --   ALBUMIN 3.9  --   --   --    Recent Labs    07/06/16 2025  09/12/16 09/29/16 10/31/16  WBC 4.1   < > 3.0 2.7 3.8  NEUTROABS 2.6  --  38  --   --   HGB 10.5*   < > 9.3* 9.5* 10.3*  HCT  33.7*   < > 29* 30* 33*  MCV 99.1  --   --   --   --   PLT 201   < > 175 108* 192   < > = values in this interval not displayed.   Lab Results  Component  Value Date   TSH 2.03 05/09/2016   Lab Results  Component Value Date   HGBA1C 5.2 11/28/2016   Lab Results  Component Value Date   CHOL 157 11/28/2016   HDL 55 11/28/2016   LDLCALC 85 11/28/2016   TRIG 79 11/28/2016   CHOLHDL 2.7 06/27/2012    Significant Diagnostic Results in last 30 days:  No results found.  Assessment/Plan Patient Active Problem List   Diagnosis Date Noted  . Mixed Alzheimer's and vascular dementia 11/16/2016  . Generalized osteoarthritis 11/16/2016  . Delusion ([place]) 09/11/2016  . Localized osteoarthritis of shoulder regions, bilateral 07/18/2016  . CKD (chronic kidney disease) stage 3, GFR 30-59 ml/min (HCC) 07/17/2016  . Allergic rhinitis 07/17/2016  . Chronic midline low back pain without sciatica 07/17/2016  . History of ductal carcinoma in situ (DCIS) of breast 07/17/2016  . Paresthesia 05/29/2016  . Anemia of chronic disease 05/10/2016  . Depression, psychotic ([place]) 05/04/2016  . GERD (gastroesophageal reflux disease) 05/04/2016  . Diarrhea 05/04/2016  . Acute renal failure superimposed on stage 3 chronic kidney disease ([place]) 03/31/2014  . Traumatic subdural hematoma (HCC)   . Glaucoma 10/03/2013  . Essential hypertension, benign 10/03/2013  . Myalgia and myositis 10/03/2013  . 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 Alzheimer's dementia with behavioral disturbance 06/30/2012  . Urge incontinence   . Hyperlipidemia with target low density lipoprotein (LDL) cholesterol less than 100 mg/dL   . Fall 02/13/2011  . Difficulty walking 02/12/2011  . Polymyalgia rheumatica ([place]) 02/12/2011  plan and assessment: Mixed Alzheimer's and vascular dementia resides in SNF, w/c dependent, two person transfer, continue Donepezil 10mg  daily for memory.  CKD (chronic kidney disease) stage 3, GFR 30-59 ml/min last Bun 24, creat 1.21 09/29/16, update CMP    Anemia of chronic  disease Continue Ferrous sulfate 325mg  qd, Hgb 9.5 09/29/16, ferritin 98 11/21/16, update CBC  Depression, psychotic ([place]) He mood is stable, continue Citalopram 20mg  qd, psychosis, stable, continue Seroquel 25mg  am, 50mg  pm, Hgb a1c 5.2 11/29/16.      Family/ staff Communication: plan of care reviewed with the patient and charge nurse.   Labs/tests ordered:  CBC CMP   Time spend 25 minutes.

## 2016-12-19 ENCOUNTER — Other Ambulatory Visit: Payer: Self-pay | Admitting: *Deleted

## 2016-12-19 DIAGNOSIS — I10 Essential (primary) hypertension: Secondary | ICD-10-CM | POA: Diagnosis not present

## 2016-12-19 DIAGNOSIS — D631 Anemia in chronic kidney disease: Secondary | ICD-10-CM | POA: Diagnosis not present

## 2016-12-19 LAB — BASIC METABOLIC PANEL
BUN: 34 — AB (ref 4–21)
Creatinine: 1.2 — AB (ref <=1.1)
Potassium: 4.2 (ref 3.4–5.3)
Sodium: 142 (ref 137–147)

## 2016-12-19 LAB — HEPATIC FUNCTION PANEL
ALT: 10 (ref 7–35)
AST: 12 — AB (ref 13–35)
Alkaline Phosphatase: 80 (ref 25–125)
BILIRUBIN, TOTAL: 0.4

## 2016-12-19 LAB — CBC AND DIFFERENTIAL
HCT: 30 — AB (ref 36–46)
HEMOGLOBIN: 10 — AB (ref 12.0–16.0)
PLATELETS: 172 (ref 150–399)
WBC: 3.1

## 2017-01-01 ENCOUNTER — Non-Acute Institutional Stay (SKILLED_NURSING_FACILITY): Payer: Medicare Other | Admitting: Nurse Practitioner

## 2017-01-01 ENCOUNTER — Encounter: Payer: Self-pay | Admitting: Nurse Practitioner

## 2017-01-01 DIAGNOSIS — S91209A Unspecified open wound of unspecified toe(s) with damage to nail, initial encounter: Secondary | ICD-10-CM | POA: Insufficient documentation

## 2017-01-01 DIAGNOSIS — F22 Delusional disorders: Secondary | ICD-10-CM

## 2017-01-01 DIAGNOSIS — F02818 Dementia in other diseases classified elsewhere, unspecified severity, with other behavioral disturbance: Secondary | ICD-10-CM

## 2017-01-01 DIAGNOSIS — F0281 Dementia in other diseases classified elsewhere with behavioral disturbance: Secondary | ICD-10-CM | POA: Diagnosis not present

## 2017-01-01 DIAGNOSIS — G301 Alzheimer's disease with late onset: Secondary | ICD-10-CM | POA: Diagnosis not present

## 2017-01-01 NOTE — Assessment & Plan Note (Signed)
injured right 3rd toenail, painful, loose, no signs of infection. The patient has yellow thickened tod nails. Podiatrist referral ASAP, protective dressing for now.

## 2017-01-01 NOTE — Assessment & Plan Note (Signed)
Resides in SNF, near total care of ADLs, one person transfer, w/c dependent.  Continue Donepezil 10mg  qd.

## 2017-01-01 NOTE — Assessment & Plan Note (Signed)
He mood is stable, continue Citalopram 20mg  qd, psychosis, still delusional at times, but she was able to re-directed,  continue Seroquel 25mg  am, 50mg  pm

## 2017-01-01 NOTE — Progress Notes (Addendum)
Location:  [facility] Room Number: 59 Place of Service:  SNF (31) Provider:  , xie  NP  Blanchie Serve, MD  Patient Care Team: Blanchie Serve, MD as PCP - General (Internal Medicine) Stark Klein, MD as Consulting Physician (General Surgery) Magrinat, Virgie Dad, MD as Consulting Physician (Hematology and Oncology) Kyung Rudd, MD as Consulting Physician (Radiation Oncology) Luberta Mutter, MD as Consulting Physician (Ophthalmology) ,  X, NP as Nurse Practitioner (Internal Medicine)  Extended Emergency Contact Information Primary Emergency Contact: [name],[name] Address: [address]          [place], [place] [address] [place] Phone: [phone] Work Phone: [phone] Relation: Daughter  Code Status:  DNR Goals of care: Advanced Directive information Advanced Directives 11/16/2016  Does Patient Have a Medical Advance Directive? Yes  Type of Paramedic of [place];Out of facility DNR (pink MOST or yellow form)  Does patient want to make changes to medical advance directive? No - Patient declined  Copy of [place] in Chart? Yes  Would patient like information on creating a medical advance directive? -  Pre-existing out of facility DNR order (yellow form or pink MOST form) Yellow form placed in chart (order not valid for inpatient use)     Chief Complaint  Patient presents with  . Acute Visit    toe nail coming off     HPI:  Pt is a 83 y.o. female seen today for an acute visit for injured right 3rd toenail, painful, loose, no signs of infection. The patient has yellow thickened tod nails. The patient has history of dementia, resides in SNF, near total care, takes Aricept to preserve her memory, her mood/psychosis is managed with Celexa 20mg  qd, Seroquel 25mg  am, 50mg  qhs, still some delusions   Past Medical History:  Diagnosis Date  . Abnormal weight gain   .  Abnormality of gait   . Acute upper respiratory infections of unspecified site   . Anemia, unspecified   . Appendicitis   . Arthritis   . Chronic kidney disease, stage III (moderate) (HCC)   . Dementia in conditions classified elsewhere without behavioral disturbance   . Depression   . Diverticulitis   . Diverticulosis of colon (without mention of hemorrhage)   . Dizziness and giddiness   . Edema of leg   . GERD (gastroesophageal reflux disease)   . Glaucoma   . Hyperlipidemia LDL goal < 100   . Hypertension   . Irritable bowel syndrome   . Irritable bowel syndrome   . Leukocytopenia, unspecified   . Malignant neoplasm of breast (female), unspecified site   . Memory loss   . Other B-complex deficiencies   . Other malaise and fatigue   . Other specified symptom associated with female genital organs   . Personal history of fall   . Polymyalgia rheumatica ([place])   . Regional enteritis of unspecified site   . Sciatica   . Sciatica   . Sebaceous cyst   . Unspecified arthropathy, shoulder region   . Unspecified glaucoma(365.9)   . Unspecified vitamin D deficiency   . Urge incontinence   . Vertigo    Past Surgical History:  Procedure Laterality Date  . BREAST LUMPECTOMY  10/28/10   left breast   . BREAST SURGERY     R breast lumpectomy for benign disease  . CATARACT EXTRACTION  1990  . CHOLECYSTECTOMY  1972  . NASAL SINUS SURGERY  1966  Allergies  Allergen Reactions  . Codeine     States she blacks out. Listed on [facility]    Outpatient Encounter Medications as of 01/01/2017  Medication Sig  . acetaminophen (TYLENOL) 500 MG tablet Take 1,000 mg by mouth 3 (three) times daily. 8AM, 1PM, 6PM  . bimatoprost (LUMIGAN) 0.01 % SOLN Place 1 drop into both eyes at bedtime.   . brimonidine-timolol (COMBIGAN) 0.2-0.5 % ophthalmic solution Place 1 drop into both eyes 2 (two) times daily.   . calcium carbonate (TUMS - DOSED IN MG ELEMENTAL CALCIUM) 500 MG chewable tablet Chew 1 tablet  by mouth daily.  . chlorhexidine (PERIDEX) 0.12 % solution Use as directed 15 mLs in the mouth or throat at bedtime. One teaspoon to brush teeth at bedtime   . citalopram (CELEXA) 20 MG tablet Take 20 mg by mouth daily.  . diclofenac sodium (VOLTAREN) 1 % GEL Apply 2 g topically 3 (three) times daily. Lower back  . diphenoxylate-atropine (LOMOTIL) 2.5-0.025 MG tablet Take 1 tablet by mouth 2 (two) times daily.  Marland Kitchen donepezil (ARICEPT) 10 MG tablet Take 10 mg by mouth at bedtime. Start on 10/01/16  . dorzolamide (TRUSOPT) 2 % ophthalmic solution Place 2 drops into both eyes 2 (two) times daily.  . ferrous sulfate 325 (65 FE) MG tablet Take 325 mg daily with breakfast by mouth.  Marland Kitchen guaiFENesin-codeine (ROBAFEN AC) 100-10 MG/5ML syrup Take 4 mLs by mouth 4 (four) times daily as needed for cough or congestion.  . hyoscyamine (LEVSIN, ANASPAZ) 0.125 MG tablet Take 0.125 mg by mouth 3 (three) times daily as needed for cramping. Take at 8AM, 12PM, and 5PM to help relieve gas and abdominal discomfort.   . loperamide (IMODIUM A-D) 2 MG tablet Take 4 mg by mouth as directed. Give 4mg  initial dose, then 2mg  after each loose stool x48 hours with a maximum of 16mg  in 24 hours. If diarrhea persists, notify MD.   . Multiple Vitamins-Minerals (MULTIVITAMIN WITH MINERALS) tablet Take 1 tablet by mouth daily.  . QUEtiapine (SEROQUEL) 50 MG tablet Take 50 mg by mouth at bedtime.   No facility-administered encounter medications on file as of 01/01/2017.    ROS was provided with assistance of staff Review of Systems  Constitutional: Negative for activity change, appetite change, chills, diaphoresis, fatigue and fever.  HENT: Positive for hearing loss. Negative for congestion, trouble swallowing and voice change.   Cardiovascular: Negative for chest pain and leg swelling.  Musculoskeletal: Positive for gait problem.  Skin: Negative for pallor, rash and wound.       The right 3rd toe pain.   Neurological: Negative for  tremors, speech difficulty and weakness.       Dementia  Psychiatric/Behavioral: Positive for behavioral problems and confusion. Negative for agitation, hallucinations and sleep disturbance. The patient is not nervous/anxious.        Delusions.     Immunization History  Administered Date(s) Administered  . Influenza,inj,Quad PF,6+ Mos 11/07/2012, 10/03/2013  . Influenza-Unspecified 12/30/2009, 11/17/2010, 11/30/2011, 11/16/2016  . Pneumococcal Conjugate-13 01/05/2014  . Pneumococcal Polysaccharide-23 01/30/2006   Pertinent  Health Maintenance Due  Topic Date Due  . INFLUENZA VACCINE  Completed  . DEXA SCAN  Completed  . PNA vac Low Risk Adult  Completed   Fall Risk  09/19/2016 05/29/2016 01/05/2014 10/03/2013 09/05/2012  Falls in the past year? Yes Yes Yes Yes Yes  Number falls in past yr: 1 2 or more 2 or more 2 or more -  Comment - - -  no injury per patient -  Injury with Fall? No Yes - - -  Comment - back pain - - -  Risk Factor Category  - High Fall Risk - High Fall Risk -  Risk for fall due to : - - - History of fall(s);Impaired balance/gait;Impaired mobility;Impaired vision;Mental status change -   Functional Status Survey:    Vitals:   01/01/17 1357  BP: 110/62  Pulse: 72  Resp: 18  Temp: 97.9 F (36.6 C)  SpO2: 92%  Weight: 187 lb 11.2 oz (85.1 kg)  Height: 5\' 5"  (1.651 m)   Body mass index is 31.23 kg/m. Physical Exam  Constitutional: She appears well-developed and well-nourished. No distress.  HENT:  Head: Normocephalic and atraumatic.  Musculoskeletal: Normal range of motion. She exhibits no edema or tenderness.  One person transfer, w/c dependent.   Neurological: She is alert.  Oriented to self.   Skin: Skin is warm and dry. No rash noted. She is not diaphoretic. There is erythema. No pallor.  Yellow, thickened toe nails. The right 3rd toe nail is loose, nail bed is erythematous, painful during my examination.  Psychiatric: She has a normal mood and affect.    Not delusional upon my visit.     Labs reviewed: Recent Labs    07/06/16 2025  09/29/16 10/31/16 12/19/16  NA 141   < > 141 145 142  K 4.9   < > 4.7 4.2 4.2  CL 108  --   --   --   --   CO2 27  --   --   --   --   GLUCOSE 114*  --   --   --   --   BUN 26*   < > 24* 24* 34*  CREATININE 1.18*   < > 1.2* 1.1 1.2*  CALCIUM 9.3  --   --   --   --    < > = values in this interval not displayed.   Recent Labs    07/06/16 2025  09/12/16 10/31/16 12/19/16  AST 18   < > 15 15 12*  ALT 15   < > 10 10 10   ALKPHOS 75   < > 58 71 80  BILITOT 0.7  --   --   --   --   PROT 7.4  --   --   --   --   ALBUMIN 3.9  --   --   --   --    < > = values in this interval not displayed.   Recent Labs    07/06/16 2025  09/12/16 09/29/16 10/31/16 12/19/16  WBC 4.1   < > 3.0 2.7 3.8 3.1  NEUTROABS 2.6  --  38  --   --   --   HGB 10.5*   < > 9.3* 9.5* 10.3* 10.0*  HCT 33.7*   < > 29* 30* 33* 30*  MCV 99.1  --   --   --   --   --   PLT 201   < > 175 108* 192 172   < > = values in this interval not displayed.   Lab Results  Component Value Date   TSH 2.03 05/09/2016   Lab Results  Component Value Date   HGBA1C 5.2 11/28/2016   Lab Results  Component Value Date   CHOL 157 11/28/2016   HDL 55 11/28/2016   LDLCALC 85 11/28/2016   TRIG 79 11/28/2016   CHOLHDL 2.7 06/27/2012  Significant Diagnostic Results in last 30 days:  No results found.  Assessment/Plan Traumatic avulsion of nail plate of toe injured right 3rd toenail, painful, loose, no signs of infection. The patient has yellow thickened tod nails. Podiatrist referral ASAP, protective dressing for now.     Alzheimer's dementia with behavioral disturbance Resides in SNF, near total care of ADLs, one person transfer, w/c dependent.  Continue Donepezil 10mg  qd.     Delusion ([place]) He mood is stable, continue Citalopram 20mg  qd, psychosis, still delusional at times, but she was able to re-directed,  continue Seroquel 25mg  am,  50mg  pm     Family/ staff Communication: plan of care reviewed with the patient and charge nurse. Tdap prescription given.   Labs/tests ordered:  none  Time spend 25 minutes.

## 2017-01-04 DIAGNOSIS — M79674 Pain in right toe(s): Secondary | ICD-10-CM | POA: Diagnosis not present

## 2017-01-04 DIAGNOSIS — L03031 Cellulitis of right toe: Secondary | ICD-10-CM | POA: Diagnosis not present

## 2017-01-16 ENCOUNTER — Non-Acute Institutional Stay (SKILLED_NURSING_FACILITY): Payer: Medicare Other | Admitting: Internal Medicine

## 2017-01-16 ENCOUNTER — Encounter: Payer: Self-pay | Admitting: Internal Medicine

## 2017-01-16 DIAGNOSIS — R2681 Unsteadiness on feet: Secondary | ICD-10-CM | POA: Diagnosis not present

## 2017-01-16 DIAGNOSIS — G301 Alzheimer's disease with late onset: Secondary | ICD-10-CM | POA: Diagnosis not present

## 2017-01-16 DIAGNOSIS — N183 Chronic kidney disease, stage 3 unspecified: Secondary | ICD-10-CM

## 2017-01-16 DIAGNOSIS — F0281 Dementia in other diseases classified elsewhere with behavioral disturbance: Secondary | ICD-10-CM | POA: Diagnosis not present

## 2017-01-16 DIAGNOSIS — F02818 Dementia in other diseases classified elsewhere, unspecified severity, with other behavioral disturbance: Secondary | ICD-10-CM

## 2017-01-16 DIAGNOSIS — D638 Anemia in other chronic diseases classified elsewhere: Secondary | ICD-10-CM | POA: Diagnosis not present

## 2017-01-16 NOTE — Progress Notes (Signed)
Location:  [facility] Room Number: 21 Place of Service:  SNF [phone]) Provider:  Blanchie Serve MD  Blanchie Serve, MD  Patient Care Team: Blanchie Serve, MD as PCP - General (Internal Medicine) Stark Klein, MD as Consulting Physician (General Surgery) Magrinat, Virgie Dad, MD as Consulting Physician (Hematology and Oncology) Kyung Rudd, MD as Consulting Physician (Radiation Oncology) Luberta Mutter, MD as Consulting Physician (Ophthalmology) Mast, Man X, NP as Nurse Practitioner (Internal Medicine)  Extended Emergency Contact Information Primary Emergency Contact: [name],[name] Address: [address]          [place], [place] [address] [place] Phone: [phone] Work Phone: [phone] Relation: Daughter  Code Status:  DNR  Goals of care: Advanced Directive information Advanced Directives 01/16/2017  Does Patient Have a Medical Advance Directive? Yes  Type of Paramedic of [place];Living will;Out of facility DNR (pink MOST or yellow form)  Does patient want to make changes to medical advance directive? No - Patient declined  Copy of [place] in Chart? Yes  Would patient like information on creating a medical advance directive? -  Pre-existing out of facility DNR order (yellow form or pink MOST form) Yellow form placed in chart (order not valid for inpatient use)     Chief Complaint  Patient presents with  . Medical Management of Chronic Issues    Routine Visit    HPI:  Pt is a 83 y.o. female seen today for medical management of chronic diseases. She has advanced dementia and this limits her participation in HPI and ROS. She has a Biomedical engineer with her 2 hours 4 days a week. She continues to have hallucination and confusion but no acute behavior changes with agitation/ outbursts reported. She ate 100% of her breakfast this am but hardly ate her lunch. She denies any concern. No  acute concern from nursing. She needs 2 person maximum assist with transfers and is wheeled around on her wheelchair.    Past Medical History:  Diagnosis Date  . Abnormal weight gain   . Abnormality of gait   . Acute upper respiratory infections of unspecified site   . Anemia, unspecified   . Appendicitis   . Arthritis   . Chronic kidney disease, stage III (moderate) (HCC)   . Dementia in conditions classified elsewhere without behavioral disturbance   . Depression   . Diverticulitis   . Diverticulosis of colon (without mention of hemorrhage)   . Dizziness and giddiness   . Edema of leg   . GERD (gastroesophageal reflux disease)   . Glaucoma   . Hyperlipidemia LDL goal < 100   . Hypertension   . Irritable bowel syndrome   . Irritable bowel syndrome   . Leukocytopenia, unspecified   . Malignant neoplasm of breast (female), unspecified site   . Memory loss   . Other B-complex deficiencies   . Other malaise and fatigue   . Other specified symptom associated with female genital organs   . Personal history of fall   . Polymyalgia rheumatica ([place])   . Regional enteritis of unspecified site   . Sciatica   . Sciatica   . Sebaceous cyst   . Unspecified arthropathy, shoulder region   . Unspecified glaucoma(365.9)   . Unspecified vitamin D deficiency   . Urge incontinence   . Vertigo    Past Surgical History:  Procedure Laterality Date  . BREAST LUMPECTOMY  10/28/10   left breast   . BREAST  SURGERY     R breast lumpectomy for benign disease  . CATARACT EXTRACTION  1990  . CHOLECYSTECTOMY  1972  . NASAL SINUS SURGERY  1966    Allergies  Allergen Reactions  . Codeine     States she blacks out. Listed on [facility]    Outpatient Encounter Medications as of 01/16/2017  Medication Sig  . acetaminophen (TYLENOL) 500 MG tablet Take 1,000 mg by mouth 3 (three) times daily. 8AM, 1PM, 6PM  . bimatoprost (LUMIGAN) 0.01 % SOLN Place 1 drop into both eyes at bedtime.   .  brimonidine-timolol (COMBIGAN) 0.2-0.5 % ophthalmic solution Place 1 drop into both eyes 2 (two) times daily.   . calcium carbonate (TUMS - DOSED IN MG ELEMENTAL CALCIUM) 500 MG chewable tablet Chew 1 tablet by mouth daily.  . chlorhexidine (PERIDEX) 0.12 % solution Use as directed 15 mLs in the mouth or throat at bedtime. One teaspoon to brush teeth at bedtime   . citalopram (CELEXA) 20 MG tablet Take 20 mg by mouth daily.  . diclofenac sodium (VOLTAREN) 1 % GEL Apply 2 g topically 3 (three) times daily. Lower back  . diphenoxylate-atropine (LOMOTIL) 2.5-0.025 MG tablet Take 1 tablet by mouth 2 (two) times daily.  Marland Kitchen donepezil (ARICEPT) 10 MG tablet Take 10 mg by mouth at bedtime.   . dorzolamide (TRUSOPT) 2 % ophthalmic solution Place 2 drops into both eyes 2 (two) times daily.  . ferrous sulfate 325 (65 FE) MG tablet Take 325 mg daily with breakfast by mouth.  Marland Kitchen guaiFENesin-codeine (ROBAFEN AC) 100-10 MG/5ML syrup Take 4 mLs by mouth 4 (four) times daily as needed for cough or congestion.  . hyoscyamine (LEVSIN, ANASPAZ) 0.125 MG tablet Take 0.125 mg by mouth 3 (three) times daily as needed for cramping. Take at 8AM, 12PM, and 5PM to help relieve gas and abdominal discomfort.   . loperamide (IMODIUM A-D) 2 MG tablet Take 4 mg by mouth as directed. Give 4mg  initial dose, then 2mg  after each loose stool x48 hours with a maximum of 16mg  in 24 hours. If diarrhea persists, notify MD.   . Multiple Vitamins-Minerals (MULTIVITAMIN WITH MINERALS) tablet Take 1 tablet by mouth daily.  . QUEtiapine (SEROQUEL) 25 MG tablet Take 25 mg by mouth every morning.  Marland Kitchen QUEtiapine (SEROQUEL) 50 MG tablet Take 50 mg by mouth daily.    No facility-administered encounter medications on file as of 01/16/2017.     Review of Systems  Unable to perform ROS: Dementia (limited)  Constitutional: Negative for fever.  HENT: Negative for congestion and rhinorrhea.   Eyes: Positive for visual disturbance.  Respiratory:  Negative for cough and shortness of breath.   Cardiovascular: Negative for chest pain.  Gastrointestinal: Negative for abdominal pain, diarrhea and vomiting.       Continent with her bowel and bladder for most part  Musculoskeletal: Positive for gait problem. Negative for back pain.  Neurological: Positive for weakness. Negative for dizziness and headaches.       Has lower extremity weakness  Psychiatric/Behavioral: Positive for behavioral problems, confusion and hallucinations. Negative for agitation.    Immunization History  Administered Date(s) Administered  . Influenza,inj,Quad PF,6+ Mos 11/07/2012, 10/03/2013  . Influenza-Unspecified 12/30/2009, 11/17/2010, 11/30/2011, 11/16/2016  . Pneumococcal Conjugate-13 01/05/2014  . Pneumococcal Polysaccharide-23 01/30/2006   Pertinent  Health Maintenance Due  Topic Date Due  . INFLUENZA VACCINE  Completed  . DEXA SCAN  Completed  . PNA vac Low Risk Adult  Completed   Fall Risk  09/19/2016 05/29/2016 01/05/2014 10/03/2013 09/05/2012  Falls in the past year? Yes Yes Yes Yes Yes  Number falls in past yr: 1 2 or more 2 or more 2 or more -  Comment - - - no injury per patient -  Injury with Fall? No Yes - - -  Comment - back pain - - -  Risk Factor Category  - High Fall Risk - High Fall Risk -  Risk for fall due to : - - - History of fall(s);Impaired balance/gait;Impaired mobility;Impaired vision;Mental status change -   Functional Status Survey:    Vitals:   01/16/17 1257  BP: 130/70  Pulse: 70  Resp: 20  Temp: (!) 96.8 F (36 C)  TempSrc: Oral  SpO2: 95%  Weight: 187 lb 4.8 oz (85 kg)  Height: 5\' 5"  (1.651 m)   Body mass index is 31.17 kg/m.   Wt Readings from Last 3 Encounters:  01/16/17 187 lb 4.8 oz (85 kg)  01/01/17 187 lb 11.2 oz (85.1 kg)  12/18/16 187 lb 11.2 oz (85.1 kg)   Physical Exam  Constitutional:  Obese, elderly female in no acute distress  HENT:  Head: Normocephalic and atraumatic.  Mouth/Throat:  Oropharynx is clear and moist.  Eyes: Conjunctivae are normal. Pupils are equal, round, and reactive to light.  Neck: Neck supple.  Cardiovascular: Normal rate and regular rhythm.  Pulmonary/Chest: Effort normal. No respiratory distress. She has no wheezes. She has no rales.  Decreased air entry to lung bases  Abdominal: Soft. Bowel sounds are normal. There is no tenderness. There is no guarding.  Musculoskeletal: She exhibits edema and deformity.  Trace leg edema, limited ROM with hip and shoulder, lower extremity weakness, wheelchair for ambulation and needs 2 person assistance with transfers  Lymphadenopathy:    She has no cervical adenopathy.  Neurological: She is alert.  Oriented to self only  Skin: Skin is warm and dry. She is not diaphoretic.  Psychiatric:  Pleasantly confused this visit.    Labs reviewed: Recent Labs    07/06/16 2025  09/29/16 10/31/16 12/19/16  NA 141   < > 141 145 142  K 4.9   < > 4.7 4.2 4.2  CL 108  --   --   --   --   CO2 27  --   --   --   --   GLUCOSE 114*  --   --   --   --   BUN 26*   < > 24* 24* 34*  CREATININE 1.18*   < > 1.2* 1.1 1.2*  CALCIUM 9.3  --   --   --   --    < > = values in this interval not displayed.   Recent Labs    07/06/16 2025  09/12/16 10/31/16 12/19/16  AST 18   < > 15 15 12*  ALT 15   < > 10 10 10   ALKPHOS 75   < > 58 71 80  BILITOT 0.7  --   --   --   --   PROT 7.4  --   --   --   --   ALBUMIN 3.9  --   --   --   --    < > = values in this interval not displayed.   Recent Labs    07/06/16 2025  09/12/16 09/29/16 10/31/16 12/19/16  WBC 4.1   < > 3.0 2.7 3.8 3.1  NEUTROABS 2.6  --  38  --   --   --  HGB 10.5*   < > 9.3* 9.5* 10.3* 10.0*  HCT 33.7*   < > 29* 30* 33* 30*  MCV 99.1  --   --   --   --   --   PLT 201   < > 175 108* 192 172   < > = values in this interval not displayed.   Lab Results  Component Value Date   TSH 2.03 05/09/2016   Lab Results  Component Value Date   HGBA1C 5.2 11/28/2016    Lab Results  Component Value Date   CHOL 157 11/28/2016   HDL 55 11/28/2016   LDLCALC 85 11/28/2016   TRIG 79 11/28/2016   CHOLHDL 2.7 06/27/2012    Significant Diagnostic Results in last 30 days:  No results found.  Assessment/Plan   CKD stage 3 Monitor renal function periodically  Anemia of chronic disease Continue feso4 325 mg daily. Monitor h&h periodically  Unsteady gait Needs 2 person assistance with transfers. High fall risk. Fall precautions  Dementia with Psychosis With dementia, ongoing but stable, continue seroquel 25 mg in am and 50 mg pm for now. Supportive care. Continue aricept    Family/ staff Communication: reviewed care plan with patient and charge nurse.    Labs/tests ordered:  none   Blanchie Serve, MD Internal Medicine [facility] [address] [place], [place] [address] Cell Phone (Monday-Friday 8 am - 5 pm): [phone] On Call: [phone] and follow prompts after 5 pm and on weekends Office Phone: [phone] Office Fax: [phone]

## 2017-01-18 ENCOUNTER — Non-Acute Institutional Stay (SKILLED_NURSING_FACILITY): Payer: Medicare Other | Admitting: Nurse Practitioner

## 2017-01-18 ENCOUNTER — Encounter: Payer: Self-pay | Admitting: Nurse Practitioner

## 2017-01-18 DIAGNOSIS — R400 Somnolence: Secondary | ICD-10-CM | POA: Diagnosis not present

## 2017-01-18 DIAGNOSIS — G301 Alzheimer's disease with late onset: Secondary | ICD-10-CM

## 2017-01-18 DIAGNOSIS — F0281 Dementia in other diseases classified elsewhere with behavioral disturbance: Secondary | ICD-10-CM

## 2017-01-18 DIAGNOSIS — T8189XA Other complications of procedures, not elsewhere classified, initial encounter: Secondary | ICD-10-CM | POA: Diagnosis not present

## 2017-01-18 DIAGNOSIS — F02818 Dementia in other diseases classified elsewhere, unspecified severity, with other behavioral disturbance: Secondary | ICD-10-CM

## 2017-01-18 NOTE — Assessment & Plan Note (Signed)
patient's lethargy and difficult to arouse, the patient was combative when hs care provided,  agitated with attempts to arouse at 12:14am 01/18/17. The patient is alert, responsive, followed simple directions, no focal neurological symptoms upon my visit today.  Update CBC/diff, CMP in am

## 2017-01-18 NOTE — Progress Notes (Signed)
Location:  [facility] Room Number: 61 Place of Service:  SNF (31) Provider:  Danella Philson, Manxie  NP  Blanchie Serve, MD  Michelle [name] Care Team: Blanchie Serve, MD as PCP - General (Internal Medicine) Stark Klein, MD as Consulting Physician (General Surgery) Magrinat, Virgie Dad, MD as Consulting Physician (Hematology and Oncology) Kyung Rudd, MD as Consulting Physician (Radiation Oncology) Luberta Mutter, MD as Consulting Physician (Ophthalmology) Maudene Stotler X, NP as Nurse Practitioner (Internal Medicine)  Extended Emergency Contact Information Primary Emergency Contact: [name],[name] Address: [address]          [place], [place] [address] [place] Phone: [phone] Work Phone: [phone] Relation: Daughter  Code Status:  DNR Goals of care: Advanced Directive information Advanced Directives 01/16/2017  Does Michelle [name] Have a Medical Advance Directive? Yes  Type of Paramedic of [place];Living will;Out of facility DNR (pink MOST or yellow form)  Does Michelle [name] want to make changes to medical advance directive? No - Michelle [name] declined  Copy of [place] in Chart? Yes  Would Michelle [name] like information on creating a medical advance directive? -  Pre-existing out of facility DNR order (yellow form or pink MOST form) Yellow form placed in chart (order not valid for inpatient use)     Chief Complaint  Michelle [name] presents with  . Acute Visit    lethargic and difficult to arouse    HPI:  Pt is a 83 y.o. female seen today for an acute visit for staff reported Michelle [name]'s lethargy and difficult to arouse, the Michelle [name] was combative when hs care provided,  agitated with attempts to arouse at 12:14am 01/18/17. The Michelle [name] is alert, responsive, followed simple directions, no focal neurological symptoms upon my visit today.   Hx of dementia, psychosis, depression/anxiety, taking Seroquel 50mg  pm, 25mg  am, Donepezil  10mg  daily, Citalopram 20mg  daily/. She resides in SNF, 2 person transfer, w/c for mobility.    Past Medical History:  Diagnosis Date  . Abnormal weight gain   . Abnormality of gait   . Acute upper respiratory infections of unspecified site   . Anemia, unspecified   . Appendicitis   . Arthritis   . Chronic kidney disease, stage III (moderate) (HCC)   . Dementia in conditions classified elsewhere without behavioral disturbance   . Depression   . Diverticulitis   . Diverticulosis of colon (without mention of hemorrhage)   . Dizziness and giddiness   . Edema of leg   . GERD (gastroesophageal reflux disease)   . Glaucoma   . Hyperlipidemia LDL goal < 100   . Hypertension   . Irritable bowel syndrome   . Irritable bowel syndrome   . Leukocytopenia, unspecified   . Malignant neoplasm of breast (female), unspecified site   . Memory loss   . Other B-complex deficiencies   . Other malaise and fatigue   . Other specified symptom associated with female genital organs   . Personal history of fall   . Polymyalgia rheumatica ([place])   . Regional enteritis of unspecified site   . Sciatica   . Sciatica   . Sebaceous cyst   . Unspecified arthropathy, shoulder region   . Unspecified glaucoma(365.9)   . Unspecified vitamin D deficiency   . Urge incontinence   . Vertigo    Past Surgical History:  Procedure Laterality Date  . BREAST LUMPECTOMY  10/28/10   left breast   . BREAST SURGERY     R breast lumpectomy for benign  disease  . CATARACT EXTRACTION  1990  . CHOLECYSTECTOMY  1972  . NASAL SINUS SURGERY  1966    Allergies  Allergen Reactions  . Codeine     States she blacks out. Listed on [facility]    Outpatient Encounter Medications as of 01/18/2017  Medication Sig  . acetaminophen (TYLENOL) 500 MG tablet Take 1,000 mg by mouth 3 (three) times daily. 8AM, 1PM, 6PM  . bimatoprost (LUMIGAN) 0.01 % SOLN Place 1 drop into both eyes at bedtime.   . brimonidine-timolol (COMBIGAN) 0.2-0.5  % ophthalmic solution Place 1 drop into both eyes 2 (two) times daily.   . calcium carbonate (TUMS - DOSED IN MG ELEMENTAL CALCIUM) 500 MG chewable tablet Chew 1 tablet by mouth daily.  . chlorhexidine (PERIDEX) 0.12 % solution Use as directed 15 mLs in the mouth or throat at bedtime. One teaspoon to brush teeth at bedtime   . citalopram (CELEXA) 20 MG tablet Take 20 mg by mouth daily.  . diclofenac sodium (VOLTAREN) 1 % GEL Apply 2 g topically 3 (three) times daily. Lower back  . diphenoxylate-atropine (LOMOTIL) 2.5-0.025 MG tablet Take 1 tablet by mouth 2 (two) times daily.  Marland Kitchen donepezil (ARICEPT) 10 MG tablet Take 10 mg by mouth at bedtime.   . dorzolamide (TRUSOPT) 2 % ophthalmic solution Place 2 drops into both eyes 2 (two) times daily.  . ferrous sulfate 325 (65 FE) MG tablet Take 325 mg daily with breakfast by mouth.  Marland Kitchen guaiFENesin-codeine (ROBAFEN AC) 100-10 MG/5ML syrup Take 4 mLs by mouth 4 (four) times daily as needed for cough or congestion.  . hyoscyamine (LEVSIN, ANASPAZ) 0.125 MG tablet Take 0.125 mg by mouth 3 (three) times daily as needed for cramping. Take at 8AM, 12PM, and 5PM to help relieve gas and abdominal discomfort.   . loperamide (IMODIUM A-D) 2 MG tablet Take 4 mg by mouth as directed. Give 4mg  initial dose, then 2mg  after each loose stool x48 hours with a maximum of 16mg  in 24 hours. If diarrhea persists, notify MD.   . Multiple Vitamins-Minerals (MULTIVITAMIN WITH MINERALS) tablet Take 1 tablet by mouth daily.  . QUEtiapine (SEROQUEL) 25 MG tablet Take 25 mg by mouth every morning.  Marland Kitchen QUEtiapine (SEROQUEL) 50 MG tablet Take 50 mg by mouth daily.    No facility-administered encounter medications on file as of 01/18/2017.     Review of Systems  Constitutional: Positive for appetite change and fatigue. Negative for activity change, chills, diaphoresis and fever.  HENT: Positive for hearing loss. Negative for congestion, trouble swallowing and voice change.   Eyes:  Negative for visual disturbance.  Respiratory: Negative for cough, shortness of breath and wheezing.   Cardiovascular: Positive for leg swelling. Negative for chest pain and palpitations.  Gastrointestinal: Negative for abdominal distention, abdominal pain, constipation, diarrhea, nausea and vomiting.  Endocrine: Negative for cold intolerance.  Genitourinary: Negative for difficulty urinating, dysuria and urgency.       Incontinent of urine  Musculoskeletal: Positive for gait problem.  Skin: Negative for color change, pallor, rash and wound.  Neurological: Negative for tremors, speech difficulty, weakness and headaches.       Dementia  Psychiatric/Behavioral: Positive for agitation, behavioral problems and confusion. Negative for hallucinations and sleep disturbance. The Michelle [name] is not nervous/anxious.     Immunization History  Administered Date(s) Administered  . Influenza,inj,Quad PF,6+ Mos 11/07/2012, 10/03/2013  . Influenza-Unspecified 12/30/2009, 11/17/2010, 11/30/2011, 11/16/2016  . Pneumococcal Conjugate-13 01/05/2014  . Pneumococcal Polysaccharide-23 01/30/2006   Pertinent  Health Maintenance Due  Topic Date Due  . INFLUENZA VACCINE  Completed  . DEXA SCAN  Completed  . PNA vac Low Risk Adult  Completed   Fall Risk  09/19/2016 05/29/2016 01/05/2014 10/03/2013 09/05/2012  Falls in the past year? Yes Yes Yes Yes Yes  Number falls in past yr: 1 2 or more 2 or more 2 or more -  Comment - - - no injury per Michelle [name] -  Injury with Fall? No Yes - - -  Comment - back pain - - -  Risk Factor Category  - High Fall Risk - High Fall Risk -  Risk for fall due to : - - - History of fall(s);Impaired balance/gait;Impaired mobility;Impaired vision;Mental status change -   Functional Status Survey:    Vitals:   01/18/17 1111  BP: 130/70  Pulse: 60  Resp: 18  Temp: 97.9 F (36.6 C)  SpO2: 96%  Weight: 187 lb 4.8 oz (85 kg)  Height: 5\' 5"  (1.651 m)   Body mass index is 31.17  kg/m. Physical Exam  Constitutional: She appears well-developed and well-nourished.  HENT:  Head: Normocephalic and atraumatic.  Eyes: Conjunctivae and EOM are normal. Pupils are equal, round, and reactive to light.  Neck: Normal range of motion. Neck supple.  Cardiovascular: Normal rate.  Pulmonary/Chest: Effort normal and breath sounds normal. She has no wheezes. She has no rales.  Abdominal: Soft. Bowel sounds are normal. She exhibits no distension. There is no tenderness.  Musculoskeletal: She exhibits edema. She exhibits no tenderness.  Trace edema in legs. 2 person transfer, w/c for mobility  Neurological: She is alert. She exhibits normal muscle tone. Coordination normal.  Oriented to self.   Skin: Skin is warm and dry. No rash noted. No erythema.  Psychiatric:  Sad facial looks    Labs reviewed: Recent Labs    07/06/16 2025  10/31/16 12/19/16 01/19/17  NA 141   < > 145 142 143  K 4.9   < > 4.2 4.2 4.4  CL 108  --   --   --   --   CO2 27  --   --   --   --   GLUCOSE 114*  --   --   --   --   BUN 26*   < > 24* 34* 29*  CREATININE 1.18*   < > 1.1 1.2* 1.4*  CALCIUM 9.3  --   --   --   --    < > = values in this interval not displayed.   Recent Labs    07/06/16 2025  10/31/16 12/19/16 01/19/17  AST 18   < > 15 12* 16  ALT 15   < > 10 10 16   ALKPHOS 75   < > 71 80 84  BILITOT 0.7  --   --   --   --   PROT 7.4  --   --   --   --   ALBUMIN 3.9  --   --   --   --    < > = values in this interval not displayed.   Recent Labs    07/06/16 2025  09/12/16  10/31/16 12/19/16 01/19/17  WBC 4.1   < > 3.0   < > 3.8 3.1 3.1  NEUTROABS 2.6  --  38  --   --   --   --   HGB 10.5*   < > 9.3*   < > 10.3* 10.0* 3.5*  HCT Michelle.7*   < > 29*   < > Michelle* 30* 11*  MCV 99.1  --   --   --   --   --   --   PLT 201   < > 175   < > 192 172 155   < > = values in this interval not displayed.   Lab Results  Component Value Date   TSH 2.03 05/09/2016   Lab Results  Component Value Date    HGBA1C 5.2 11/28/2016   Lab Results  Component Value Date   CHOL 157 11/28/2016   HDL 55 11/28/2016   LDLCALC 85 11/28/2016   TRIG 79 11/28/2016   CHOLHDL 2.7 06/27/2012    Significant Diagnostic Results in last 30 days:  No results found.  Assessment/Plan Alzheimer's dementia with behavioral disturbance Hx of dementia, psychosis, depression/anxiety, taking Seroquel 50mg  pm, 25mg  am, Donepezil 10mg  daily, Citalopram 20mg  daily/. She resides in SNF, 2 person transfer, w/c for mobility.     Altered mental status  Michelle [name]'s lethargy and difficult to arouse, the Michelle [name] was combative when hs care provided,  agitated with attempts to arouse at 12:14am 01/18/17. The Michelle [name] is alert, responsive, followed simple directions, no focal neurological symptoms upon my visit today.  Update CBC/diff, CMP in am     Family/ staff Communication: plan of care reviewed with the Michelle [name] and charge nurse  Labs/tests ordered:  CBC/diff, CMP  Time spend 25 minutes.

## 2017-01-18 NOTE — Assessment & Plan Note (Addendum)
Hx of dementia, psychosis, depression/anxiety, taking Seroquel 50mg  pm, 25mg  am, Donepezil 10mg  daily, Citalopram 20mg  daily/. She resides in SNF, 2 person transfer, w/c for mobility.

## 2017-01-19 ENCOUNTER — Other Ambulatory Visit: Payer: Self-pay | Admitting: *Deleted

## 2017-01-19 DIAGNOSIS — N183 Chronic kidney disease, stage 3 (moderate): Secondary | ICD-10-CM | POA: Diagnosis not present

## 2017-01-19 DIAGNOSIS — D631 Anemia in chronic kidney disease: Secondary | ICD-10-CM | POA: Diagnosis not present

## 2017-01-19 LAB — BASIC METABOLIC PANEL
BUN: 29 — AB (ref 4–21)
Creatinine: 1.4 — AB (ref <=1.1)
Glucose: 92
Potassium: 4.4 (ref 3.4–5.3)
SODIUM: 143 (ref 137–147)

## 2017-01-19 LAB — CBC AND DIFFERENTIAL
HEMATOCRIT: 11 — AB (ref 36–46)
HEMOGLOBIN: 3.5 — AB (ref 12.0–16.0)
PLATELETS: 155 (ref 150–399)
WBC: 3.1

## 2017-01-19 LAB — HEPATIC FUNCTION PANEL
ALK PHOS: 84 (ref 25–125)
ALT: 16 (ref 7–35)
AST: 16 (ref 13–35)
BILIRUBIN, TOTAL: 0.6

## 2017-02-01 ENCOUNTER — Non-Acute Institutional Stay (SKILLED_NURSING_FACILITY): Payer: Medicare Other | Admitting: Internal Medicine

## 2017-02-01 ENCOUNTER — Encounter: Payer: Self-pay | Admitting: Internal Medicine

## 2017-02-01 DIAGNOSIS — Z7189 Other specified counseling: Secondary | ICD-10-CM

## 2017-02-01 NOTE — Progress Notes (Signed)
Location:  [facility] Room Number: 86 Place of Service:  SNF ([phone]) Provider:  Blanchie Serve, MD  Blanchie Serve, MD  Patient Care Team: Blanchie Serve, MD as PCP - General (Internal Medicine) Stark Klein, MD as Consulting Physician (General Surgery) Magrinat, Virgie Dad, MD as Consulting Physician (Hematology and Oncology) Kyung Rudd, MD as Consulting Physician (Radiation Oncology) Luberta Mutter, MD as Consulting Physician (Ophthalmology) Mast, Man X, NP as Nurse Practitioner (Internal Medicine)  Extended Emergency Contact Information Primary Emergency Contact: [name],[name] Address: [address]          [place], [place] [address] [place] Phone: [phone] Work Phone: [phone] Relation: Daughter  Code Status:  DNR  Goals of care: Advanced Directive information Advanced Directives 02/01/2017  Does Patient Have a Medical Advance Directive? Yes  Type of Paramedic of [place];Living will;Out of facility DNR (pink MOST or yellow form)  Does patient want to make changes to medical advance directive? No - Patient declined  Copy of [place] in Chart? Yes  Would patient like information on creating a medical advance directive? -  Pre-existing out of facility DNR order (yellow form or pink MOST form) Yellow form placed in chart (order not valid for inpatient use)     Chief Complaint  Patient presents with  . Acute Visit    Advanced Care Plan Meeting     HPI:  Pt is a 84 y.o. female seen today for an acute visit for goals of care discussion. She is seen today with her daughter in law present. During the ACP meeting, patient's daughter in [place] and social worker are present. Patient has advanced dementia with behavioral issues. At present, her behavior has been stable for most part with current medications.   Past Medical History:  Diagnosis Date  . Abnormal weight gain   .  Abnormality of gait   . Acute upper respiratory infections of unspecified site   . Anemia, unspecified   . Appendicitis   . Arthritis   . Chronic kidney disease, stage III (moderate) (HCC)   . Dementia in conditions classified elsewhere without behavioral disturbance   . Depression   . Diverticulitis   . Diverticulosis of colon (without mention of hemorrhage)   . Dizziness and giddiness   . Edema of leg   . GERD (gastroesophageal reflux disease)   . Glaucoma   . Hyperlipidemia LDL goal < 100   . Hypertension   . Irritable bowel syndrome   . Irritable bowel syndrome   . Leukocytopenia, unspecified   . Malignant neoplasm of breast (female), unspecified site   . Memory loss   . Other B-complex deficiencies   . Other malaise and fatigue   . Other specified symptom associated with female genital organs   . Personal history of fall   . Polymyalgia rheumatica ([place])   . Regional enteritis of unspecified site   . Sciatica   . Sciatica   . Sebaceous cyst   . Unspecified arthropathy, shoulder region   . Unspecified glaucoma(365.9)   . Unspecified vitamin D deficiency   . Urge incontinence   . Vertigo    Past Surgical History:  Procedure Laterality Date  . BREAST LUMPECTOMY  10/28/10   left breast   . BREAST SURGERY     R breast lumpectomy for benign disease  . CATARACT EXTRACTION  1990  . CHOLECYSTECTOMY  1972  . NASAL SINUS SURGERY  1966    Allergies  Allergen Reactions  . Codeine     States she blacks out. Listed on [facility]    Outpatient Encounter Medications as of 02/01/2017  Medication Sig  . acetaminophen (TYLENOL) 500 MG tablet Take 1,000 mg by mouth 3 (three) times daily. 8AM, 1PM, 6PM  . bimatoprost (LUMIGAN) 0.01 % SOLN Place 1 drop into both eyes at bedtime.   . brimonidine-timolol (COMBIGAN) 0.2-0.5 % ophthalmic solution Place 1 drop into both eyes 2 (two) times daily.   . calcium carbonate (TUMS - DOSED IN MG ELEMENTAL CALCIUM) 500 MG chewable tablet Chew 1 tablet  by mouth daily.  . chlorhexidine (PERIDEX) 0.12 % solution Use as directed 15 mLs in the mouth or throat at bedtime. One teaspoon to brush teeth at bedtime   . citalopram (CELEXA) 20 MG tablet Take 20 mg by mouth daily.  . diclofenac sodium (VOLTAREN) 1 % GEL Apply 2 g topically 3 (three) times daily. Lower back  . diphenoxylate-atropine (LOMOTIL) 2.5-0.025 MG tablet Take 1 tablet by mouth 2 (two) times daily.  Marland Kitchen donepezil (ARICEPT) 10 MG tablet Take 10 mg by mouth at bedtime.   . dorzolamide (TRUSOPT) 2 % ophthalmic solution Place 2 drops into both eyes 2 (two) times daily.  . ferrous sulfate 325 (65 FE) MG tablet Take 325 mg daily with breakfast by mouth.  Marland Kitchen guaiFENesin-codeine (ROBAFEN AC) 100-10 MG/5ML syrup Take 4 mLs by mouth 4 (four) times daily as needed for cough or congestion.  . hyoscyamine (LEVSIN, ANASPAZ) 0.125 MG tablet Take 0.125 mg by mouth 3 (three) times daily as needed for cramping. Take at 8AM, 12PM, and 5PM to help relieve gas and abdominal discomfort.   . loperamide (IMODIUM A-D) 2 MG tablet Take 4 mg by mouth as directed. Give 4mg  initial dose, then 2mg  after each loose stool x48 hours with a maximum of 16mg  in 24 hours. If diarrhea persists, notify MD.   . Multiple Vitamins-Minerals (MULTIVITAMIN WITH MINERALS) tablet Take 1 tablet by mouth daily.  . QUEtiapine (SEROQUEL) 25 MG tablet Take 25 mg by mouth every morning.  Marland Kitchen QUEtiapine (SEROQUEL) 50 MG tablet Take 50 mg by mouth daily.    No facility-administered encounter medications on file as of 02/01/2017.     Review of Systems  Unable to perform ROS: Dementia    Immunization History  Administered Date(s) Administered  . Influenza,inj,Quad PF,6+ Mos 11/07/2012, 10/03/2013  . Influenza-Unspecified 12/30/2009, 11/17/2010, 11/30/2011, 11/16/2016  . Pneumococcal Conjugate-13 01/05/2014  . Pneumococcal Polysaccharide-23 01/30/2006   Pertinent  Health Maintenance Due  Topic Date Due  . INFLUENZA VACCINE  Completed  .  DEXA SCAN  Completed  . PNA vac Low Risk Adult  Completed   Fall Risk  09/19/2016 05/29/2016 01/05/2014 10/03/2013 09/05/2012  Falls in the past year? Yes Yes Yes Yes Yes  Number falls in past yr: 1 2 or more 2 or more 2 or more -  Comment - - - no injury per patient -  Injury with Fall? No Yes - - -  Comment - back pain - - -  Risk Factor Category  - High Fall Risk - High Fall Risk -  Risk for fall due to : - - - History of fall(s);Impaired balance/gait;Impaired mobility;Impaired vision;Mental status change -   Functional Status Survey:    Vitals:   02/01/17 1426  BP: 118/76  Pulse: 63  Resp: 20  Temp: 98.5 F (36.9 C)  TempSrc: Oral  SpO2: 96%  Weight: 186 lb 6.4 oz (84.6 kg)  Height: 5\' 5"  (1.651 m)   Body mass index is 31.02 kg/m. Physical Exam  Constitutional: No distress.  obese  HENT:  Head: Normocephalic and atraumatic.  Musculoskeletal: She exhibits edema.  Neurological:  Alert and oriented only to self  Skin: Skin is warm and dry. She is not diaphoretic.  Psychiatric:  Pleasantly confused    Labs reviewed: Recent Labs    07/06/16 2025  10/31/16 12/19/16 01/19/17  NA 141   < > 145 142 143  K 4.9   < > 4.2 4.2 4.4  CL 108  --   --   --   --   CO2 27  --   --   --   --   GLUCOSE 114*  --   --   --   --   BUN 26*   < > 24* 34* 29*  CREATININE 1.18*   < > 1.1 1.2* 1.4*  CALCIUM 9.3  --   --   --   --    < > = values in this interval not displayed.   Recent Labs    07/06/16 2025  10/31/16 12/19/16 01/19/17  AST 18   < > 15 12* 16  ALT 15   < > 10 10 16   ALKPHOS 75   < > 71 80 84  BILITOT 0.7  --   --   --   --   PROT 7.4  --   --   --   --   ALBUMIN 3.9  --   --   --   --    < > = values in this interval not displayed.   Recent Labs    07/06/16 2025  09/12/16  10/31/16 12/19/16 01/19/17  WBC 4.1   < > 3.0   < > 3.8 3.1 3.1  NEUTROABS 2.6  --  38  --   --   --   --   HGB 10.5*   < > 9.3*   < > 10.3* 10.0* 3.5*  HCT 33.7*   < > 29*   < > 33* 30* 11*   MCV 99.1  --   --   --   --   --   --   PLT 201   < > 175   < > 192 172 155   < > = values in this interval not displayed.   Lab Results  Component Value Date   TSH 2.03 05/09/2016   Lab Results  Component Value Date   HGBA1C 5.2 11/28/2016   Lab Results  Component Value Date   CHOL 157 11/28/2016   HDL 55 11/28/2016   LDLCALC 85 11/28/2016   TRIG 79 11/28/2016   CHOLHDL 2.7 06/27/2012    Significant Diagnostic Results in last 30 days:  No results found.  Assessment/Plan  OA Continue tylenol 1000 mg tid, controlled per nursing, pt in no distress.   Goals of care discussion Reviewed goals of care with patient's HCPOA/ daughter in Sports coach and Education officer, museum. MOST form filled out between 3:05 pm and 3:25 pm. She is a DNR. She would not like any hospitalization. Goal is to focus on comfort measures, no further hospitalization. Antibiotic if indicated for defined trial period, only oral antibiotic, no IV antibiotic. No iv fluids desired. No feeding tube for nutritional support. Form reviewed and signed by her daughter in law/HCPOA. Patient is calm and comfortable this visit  Family/ staff Communication: reviewed care plan with patient's HCPOA and charge  nurse.    Labs/tests ordered:  none  Blanchie Serve, MD Internal Medicine [place], [place] [address] Cell Phone (Monday-Friday 8 am - 5 pm): [phone] On Call: [phone] and follow prompts after 5 pm and on weekends Office Phone: [phone] Office Fax: [phone]

## 2017-02-13 ENCOUNTER — Encounter: Payer: Self-pay | Admitting: Nurse Practitioner

## 2017-02-13 ENCOUNTER — Non-Acute Institutional Stay (SKILLED_NURSING_FACILITY): Payer: Medicare Other | Admitting: Nurse Practitioner

## 2017-02-13 DIAGNOSIS — F0280 Dementia in other diseases classified elsewhere without behavioral disturbance: Secondary | ICD-10-CM | POA: Diagnosis not present

## 2017-02-13 DIAGNOSIS — G309 Alzheimer's disease, unspecified: Secondary | ICD-10-CM

## 2017-02-13 DIAGNOSIS — F323 Major depressive disorder, single episode, severe with psychotic features: Secondary | ICD-10-CM

## 2017-02-13 DIAGNOSIS — R5383 Other fatigue: Secondary | ICD-10-CM | POA: Insufficient documentation

## 2017-02-13 DIAGNOSIS — R197 Diarrhea, unspecified: Secondary | ICD-10-CM | POA: Diagnosis not present

## 2017-02-13 DIAGNOSIS — R29818 Other symptoms and signs involving the nervous system: Secondary | ICD-10-CM | POA: Diagnosis not present

## 2017-02-13 DIAGNOSIS — F0150 Vascular dementia without behavioral disturbance: Secondary | ICD-10-CM

## 2017-02-13 NOTE — Assessment & Plan Note (Signed)
Will hold Seroquel today, then reduce Seroquel to 50mg  qd 02/14/17, observe the patient, VS qshift

## 2017-02-13 NOTE — Assessment & Plan Note (Signed)
Hx of psychosis, decrease to daily 50mg  in pm. Depression/anxiety, continue  Citalopram 20mg  daily.

## 2017-02-13 NOTE — Progress Notes (Signed)
Location:  [facility] Room Number: 39 Place of Service:  SNF (31) Provider:  Kyuss Hale, Manxie NP  Blanchie Serve, MD  Patient Care Team: Blanchie Serve, MD as PCP - General (Internal Medicine) Stark Klein, MD as Consulting Physician (General Surgery) Magrinat, Virgie Dad, MD as Consulting Physician (Hematology and Oncology) Kyung Rudd, MD as Consulting Physician (Radiation Oncology) Luberta Mutter, MD as Consulting Physician (Ophthalmology) Chyann Ambrocio X, NP as Nurse Practitioner (Internal Medicine)  Extended Emergency Contact Information Primary Emergency Contact: [name],[name] Address: [address]          [place], [place] [address] [place] Phone: [phone] Work Phone: [phone] Relation: Daughter  Code Status:  DNR Goals of care: Advanced Directive information Advanced Directives 02/01/2017  Does Patient Have a Medical Advance Directive? Yes  Type of Paramedic of [place];Living will;Out of facility DNR (pink MOST or yellow form)  Does patient want to make changes to medical advance directive? No - Patient declined  Copy of [place] in Chart? Yes  Would patient like information on creating a medical advance directive? -  Pre-existing out of facility DNR order (yellow form or pink MOST form) Yellow form placed in chart (order not valid for inpatient use)     Chief Complaint  Patient presents with  . Acute Visit    Resident is not eating or drinking, taking medications for 2-3 days.    HPI:  Pt is a 84 y.o. female seen today for an acute visit for not eating, drinking, taking oral meds for 2-3 days, she is lethargic, afebrile, no noted cough, nausea, vomiting. She is noted to have tremors in fingers, spasticity in limbs, involuntary lip movement.   Hx of psychosis, taking Seroquel 25mg  in am and 50mg  in pm. Dementia, resides in SNF, on Donepezil for memory. Depression/anxiety  taking Citalopram 20mg  daily.    Past Medical History:  Diagnosis Date  . Abnormal weight gain   . Abnormality of gait   . Acute upper respiratory infections of unspecified site   . Anemia, unspecified   . Appendicitis   . Arthritis   . Chronic kidney disease, stage III (moderate) (HCC)   . Dementia in conditions classified elsewhere without behavioral disturbance   . Depression   . Diverticulitis   . Diverticulosis of colon (without mention of hemorrhage)   . Dizziness and giddiness   . Edema of leg   . GERD (gastroesophageal reflux disease)   . Glaucoma   . Hyperlipidemia LDL goal < 100   . Hypertension   . Irritable bowel syndrome   . Irritable bowel syndrome   . Leukocytopenia, unspecified   . Malignant neoplasm of breast (female), unspecified site   . Memory loss   . Other B-complex deficiencies   . Other malaise and fatigue   . Other specified symptom associated with female genital organs   . Personal history of fall   . Polymyalgia rheumatica ([place])   . Regional enteritis of unspecified site   . Sciatica   . Sciatica   . Sebaceous cyst   . Unspecified arthropathy, shoulder region   . Unspecified glaucoma(365.9)   . Unspecified vitamin D deficiency   . Urge incontinence   . Vertigo    Past Surgical History:  Procedure Laterality Date  . BREAST LUMPECTOMY  10/28/10   left breast   . BREAST SURGERY     R breast lumpectomy for benign disease  . CATARACT EXTRACTION  [place]  . NASAL SINUS SURGERY  1966    Allergies  Allergen Reactions  . Codeine     States she blacks out. Listed on [facility]    Outpatient Encounter Medications as of 02/13/2017  Medication Sig  . acetaminophen (TYLENOL) 500 MG tablet Take 1,000 mg by mouth 3 (three) times daily. 8AM, 1PM, 6PM  . bimatoprost (LUMIGAN) 0.01 % SOLN Place 1 drop into both eyes at bedtime.   . brimonidine-timolol (COMBIGAN) 0.2-0.5 % ophthalmic solution Place 1 drop into both eyes 2 (two) times  daily.   . calcium carbonate (TUMS - DOSED IN MG ELEMENTAL CALCIUM) 500 MG chewable tablet Chew 1 tablet by mouth daily.  . chlorhexidine (PERIDEX) 0.12 % solution Use as directed 15 mLs in the mouth or throat at bedtime. One teaspoon to brush teeth at bedtime   . citalopram (CELEXA) 20 MG tablet Take 20 mg by mouth daily.  . diclofenac sodium (VOLTAREN) 1 % GEL Apply 2 g topically 3 (three) times daily. Lower back  . diphenoxylate-atropine (LOMOTIL) 2.5-0.025 MG tablet Take 1 tablet by mouth 2 (two) times daily.  Marland Kitchen donepezil (ARICEPT) 10 MG tablet Take 10 mg by mouth at bedtime.   . dorzolamide (TRUSOPT) 2 % ophthalmic solution Place 2 drops into both eyes 2 (two) times daily.  . ferrous sulfate 325 (65 FE) MG tablet Take 325 mg daily with breakfast by mouth.  Marland Kitchen guaiFENesin-codeine (ROBAFEN AC) 100-10 MG/5ML syrup Take 4 mLs by mouth 4 (four) times daily as needed for cough or congestion.  . hyoscyamine (LEVSIN, ANASPAZ) 0.125 MG tablet Take 0.125 mg by mouth 3 (three) times daily as needed for cramping. Take at 8AM, 12PM, and 5PM to help relieve gas and abdominal discomfort.   . loperamide (IMODIUM A-D) 2 MG tablet Take 4 mg by mouth as directed. Give 4mg  initial dose, then 2mg  after each loose stool x48 hours with a maximum of 16mg  in 24 hours. If diarrhea persists, notify MD.   . Multiple Vitamins-Minerals (MULTIVITAMIN WITH MINERALS) tablet Take 1 tablet by mouth daily.  . QUEtiapine (SEROQUEL) 25 MG tablet Take 25 mg by mouth every morning.  Marland Kitchen QUEtiapine (SEROQUEL) 50 MG tablet Take 50 mg by mouth daily.    No facility-administered encounter medications on file as of 02/13/2017.    ROS was provided with assistance of staff Review of Systems  Constitutional: Positive for activity change, appetite change and fatigue. Negative for chills, diaphoresis and fever.  HENT: Positive for hearing loss. Negative for congestion.   Eyes: Negative for visual disturbance.  Respiratory: Negative for  choking, shortness of breath and wheezing.   Cardiovascular: Negative for chest pain and leg swelling.  Gastrointestinal: Negative for abdominal distention, abdominal pain, constipation, diarrhea, nausea and vomiting.  Genitourinary: Negative for difficulty urinating, dysuria and urgency.       Incontinent of urine  Musculoskeletal: Positive for gait problem.  Skin: Negative for color change, pallor and rash.  Neurological: Positive for tremors. Negative for speech difficulty, weakness and headaches.       Dementia  Psychiatric/Behavioral: Positive for confusion. Negative for agitation, hallucinations and sleep disturbance. The patient is not nervous/anxious.     Immunization History  Administered Date(s) Administered  . Influenza,inj,Quad PF,6+ Mos 11/07/2012, 10/03/2013  . Influenza-Unspecified 12/30/2009, 11/17/2010, 11/30/2011, 11/16/2016  . Pneumococcal Conjugate-13 01/05/2014  . Pneumococcal Polysaccharide-23 01/30/2006   Pertinent  Health Maintenance Due  Topic Date Due  . INFLUENZA VACCINE  Completed  . DEXA  SCAN  Completed  . PNA vac Low Risk Adult  Completed   Fall Risk  09/19/2016 05/29/2016 01/05/2014 10/03/2013 09/05/2012  Falls in the past year? Yes Yes Yes Yes Yes  Number falls in past yr: 1 2 or more 2 or more 2 or more -  Comment - - - no injury per patient -  Injury with Fall? No Yes - - -  Comment - back pain - - -  Risk Factor Category  - High Fall Risk - High Fall Risk -  Risk for fall due to : - - - History of fall(s);Impaired balance/gait;Impaired mobility;Impaired vision;Mental status change -   Functional Status Survey:    Vitals:   02/13/17 1606  BP: 128/78  Pulse: (!) 54  Resp: 20  Temp: (!) 97.3 F (36.3 C)  SpO2: 95%  Weight: 186 lb 6.4 oz (84.6 kg)  Height: 5\' 5"  (1.651 m)   Body mass index is 31.02 kg/m. Physical Exam  Constitutional: She appears well-developed and well-nourished.  HENT:  Head: Normocephalic and atraumatic.  Eyes:  Conjunctivae and EOM are normal. Pupils are equal, round, and reactive to light.  Neck: Normal range of motion. Neck supple. No JVD present. No thyromegaly present.  Cardiovascular: Normal rate, regular rhythm and normal heart sounds.  No murmur heard. Pulmonary/Chest: Effort normal and breath sounds normal. She has no wheezes. She has no rales.  Abdominal: Soft. Bowel sounds are normal. She exhibits no distension. There is no tenderness.  Musculoskeletal: She exhibits no edema.  Spasticity in limbs.  Neurological: She is alert. She exhibits normal muscle tone. Coordination abnormal.  Oriented to self. Spasticity of limbs, tremor in fingers, lips involuntary movement.   Psychiatric:  lethargic    Labs reviewed: Recent Labs    07/06/16 2025  10/31/16 12/19/16 01/19/17  NA 141   < > 145 142 143  K 4.9   < > 4.2 4.2 4.4  CL 108  --   --   --   --   CO2 27  --   --   --   --   GLUCOSE 114*  --   --   --   --   BUN 26*   < > 24* 34* 29*  CREATININE 1.18*   < > 1.1 1.2* 1.4*  CALCIUM 9.3  --   --   --   --    < > = values in this interval not displayed.   Recent Labs    07/06/16 2025  10/31/16 12/19/16 01/19/17  AST 18   < > 15 12* 16  ALT 15   < > 10 10 16   ALKPHOS 75   < > 71 80 84  BILITOT 0.7  --   --   --   --   PROT 7.4  --   --   --   --   ALBUMIN 3.9  --   --   --   --    < > = values in this interval not displayed.   Recent Labs    07/06/16 2025  09/12/16  10/31/16 12/19/16 01/19/17  WBC 4.1   < > 3.0   < > 3.8 3.1 3.1  NEUTROABS 2.6  --  38  --   --   --   --   HGB 10.5*   < > 9.3*   < > 10.3* 10.0* 3.5*  HCT 33.7*   < > 29*   < > 33* 30* 11*  MCV 99.1  --   --   --   --   --   --   PLT 201   < > 175   < > 192 172 155   < > = values in this interval not displayed.   Lab Results  Component Value Date   TSH 2.03 05/09/2016   Lab Results  Component Value Date   HGBA1C 5.2 11/28/2016   Lab Results  Component Value Date   CHOL 157 11/28/2016   HDL 55  11/28/2016   LDLCALC 85 11/28/2016   TRIG 79 11/28/2016   CHOLHDL 2.7 06/27/2012    Significant Diagnostic Results in last 30 days:  No results found.  Assessment/Plan Extrapyramidal symptom Will hold Seroquel today, then reduce Seroquel to 50mg  qd 02/14/17, observe the patient, VS qshift  Lethargy Hold Seroquel today, then reduce to 50mg  daily, update CBC/diff, CMP in morning, monitor VS every shift.   Depression, psychotic ([place]) Hx of psychosis, decrease to daily 50mg  in pm. Depression/anxiety, continue  Citalopram 20mg  daily.     Mixed Alzheimer's and vascular dementia Dementia, resides in SNF, continue  Donepezil for memory.     Diarrhea Stabilized, will reduce Lomotil 2.5/0.025mg  to qd x 10 days, then dc, then starts Lomotil 2.5/0.025mg  bid prn in setting of patient's lethargy.      Family/ staff Communication: plan of care reviewed with the patient and charge nurse.   Labs/tests ordered:  CBC/diff, CMP  Time spend 25 minutes.

## 2017-02-13 NOTE — Assessment & Plan Note (Signed)
Dementia, resides in SNF, continue  Donepezil for memory.

## 2017-02-13 NOTE — Assessment & Plan Note (Signed)
Hold Seroquel today, then reduce to 50mg  daily, update CBC/diff, CMP in morning, monitor VS every shift.

## 2017-02-14 DIAGNOSIS — F418 Other specified anxiety disorders: Secondary | ICD-10-CM | POA: Diagnosis not present

## 2017-02-14 DIAGNOSIS — F3289 Other specified depressive episodes: Secondary | ICD-10-CM | POA: Diagnosis not present

## 2017-02-14 DIAGNOSIS — R258 Other abnormal involuntary movements: Secondary | ICD-10-CM | POA: Diagnosis not present

## 2017-02-14 DIAGNOSIS — F0280 Dementia in other diseases classified elsewhere without behavioral disturbance: Secondary | ICD-10-CM | POA: Diagnosis not present

## 2017-02-14 DIAGNOSIS — D631 Anemia in chronic kidney disease: Secondary | ICD-10-CM | POA: Diagnosis not present

## 2017-02-14 DIAGNOSIS — N179 Acute kidney failure, unspecified: Secondary | ICD-10-CM | POA: Diagnosis not present

## 2017-02-14 DIAGNOSIS — M1991 Primary osteoarthritis, unspecified site: Secondary | ICD-10-CM | POA: Diagnosis not present

## 2017-02-14 DIAGNOSIS — M19012 Primary osteoarthritis, left shoulder: Secondary | ICD-10-CM | POA: Diagnosis not present

## 2017-02-14 DIAGNOSIS — K219 Gastro-esophageal reflux disease without esophagitis: Secondary | ICD-10-CM | POA: Diagnosis not present

## 2017-02-14 DIAGNOSIS — H409 Unspecified glaucoma: Secondary | ICD-10-CM | POA: Diagnosis not present

## 2017-02-14 DIAGNOSIS — E785 Hyperlipidemia, unspecified: Secondary | ICD-10-CM | POA: Diagnosis not present

## 2017-02-14 DIAGNOSIS — I10 Essential (primary) hypertension: Secondary | ICD-10-CM | POA: Diagnosis not present

## 2017-02-14 DIAGNOSIS — R4182 Altered mental status, unspecified: Secondary | ICD-10-CM | POA: Diagnosis not present

## 2017-02-14 DIAGNOSIS — N183 Chronic kidney disease, stage 3 (moderate): Secondary | ICD-10-CM | POA: Diagnosis not present

## 2017-02-14 DIAGNOSIS — M19011 Primary osteoarthritis, right shoulder: Secondary | ICD-10-CM | POA: Diagnosis not present

## 2017-02-14 DIAGNOSIS — R296 Repeated falls: Secondary | ICD-10-CM | POA: Diagnosis not present

## 2017-02-14 DIAGNOSIS — R1312 Dysphagia, oropharyngeal phase: Secondary | ICD-10-CM | POA: Diagnosis not present

## 2017-02-14 DIAGNOSIS — F323 Major depressive disorder, single episode, severe with psychotic features: Secondary | ICD-10-CM | POA: Diagnosis not present

## 2017-02-14 DIAGNOSIS — R638 Other symptoms and signs concerning food and fluid intake: Secondary | ICD-10-CM | POA: Diagnosis not present

## 2017-02-14 NOTE — Assessment & Plan Note (Signed)
Stabilized, will reduce Lomotil 2.5/0.025mg  to qd x 10 days, then dc, then starts Lomotil 2.5/0.025mg  bid prn in setting of patient's lethargy.

## 2017-02-15 ENCOUNTER — Encounter: Payer: Self-pay | Admitting: Nurse Practitioner

## 2017-02-15 DIAGNOSIS — I10 Essential (primary) hypertension: Secondary | ICD-10-CM | POA: Diagnosis not present

## 2017-02-15 DIAGNOSIS — N183 Chronic kidney disease, stage 3 (moderate): Secondary | ICD-10-CM | POA: Diagnosis not present

## 2017-02-15 DIAGNOSIS — D631 Anemia in chronic kidney disease: Secondary | ICD-10-CM | POA: Diagnosis not present

## 2017-02-15 DIAGNOSIS — R1310 Dysphagia, unspecified: Secondary | ICD-10-CM | POA: Insufficient documentation

## 2017-02-15 DIAGNOSIS — R1312 Dysphagia, oropharyngeal phase: Secondary | ICD-10-CM | POA: Diagnosis not present

## 2017-02-15 DIAGNOSIS — F323 Major depressive disorder, single episode, severe with psychotic features: Secondary | ICD-10-CM | POA: Diagnosis not present

## 2017-02-15 DIAGNOSIS — M19011 Primary osteoarthritis, right shoulder: Secondary | ICD-10-CM | POA: Diagnosis not present

## 2017-02-15 DIAGNOSIS — R638 Other symptoms and signs concerning food and fluid intake: Secondary | ICD-10-CM | POA: Diagnosis not present

## 2017-02-15 DIAGNOSIS — E875 Hyperkalemia: Secondary | ICD-10-CM | POA: Insufficient documentation

## 2017-02-15 DIAGNOSIS — R4182 Altered mental status, unspecified: Secondary | ICD-10-CM | POA: Diagnosis not present

## 2017-02-16 DIAGNOSIS — I10 Essential (primary) hypertension: Secondary | ICD-10-CM | POA: Diagnosis not present

## 2017-02-16 DIAGNOSIS — N183 Chronic kidney disease, stage 3 (moderate): Secondary | ICD-10-CM | POA: Diagnosis not present

## 2017-02-16 DIAGNOSIS — R638 Other symptoms and signs concerning food and fluid intake: Secondary | ICD-10-CM | POA: Diagnosis not present

## 2017-02-16 DIAGNOSIS — F323 Major depressive disorder, single episode, severe with psychotic features: Secondary | ICD-10-CM | POA: Diagnosis not present

## 2017-02-16 DIAGNOSIS — R1312 Dysphagia, oropharyngeal phase: Secondary | ICD-10-CM | POA: Diagnosis not present

## 2017-02-16 DIAGNOSIS — R4182 Altered mental status, unspecified: Secondary | ICD-10-CM | POA: Diagnosis not present

## 2017-02-16 DIAGNOSIS — G309 Alzheimer's disease, unspecified: Secondary | ICD-10-CM | POA: Diagnosis not present

## 2017-02-16 DIAGNOSIS — M19011 Primary osteoarthritis, right shoulder: Secondary | ICD-10-CM | POA: Diagnosis not present

## 2017-02-17 DIAGNOSIS — G309 Alzheimer's disease, unspecified: Secondary | ICD-10-CM | POA: Diagnosis not present

## 2017-02-17 DIAGNOSIS — I10 Essential (primary) hypertension: Secondary | ICD-10-CM | POA: Diagnosis not present

## 2017-02-19 DIAGNOSIS — G309 Alzheimer's disease, unspecified: Secondary | ICD-10-CM | POA: Diagnosis not present

## 2017-02-19 DIAGNOSIS — I10 Essential (primary) hypertension: Secondary | ICD-10-CM | POA: Diagnosis not present

## 2017-02-20 DIAGNOSIS — I10 Essential (primary) hypertension: Secondary | ICD-10-CM | POA: Diagnosis not present

## 2017-02-20 DIAGNOSIS — G309 Alzheimer's disease, unspecified: Secondary | ICD-10-CM | POA: Diagnosis not present

## 2017-02-21 DIAGNOSIS — G309 Alzheimer's disease, unspecified: Secondary | ICD-10-CM | POA: Diagnosis not present

## 2017-02-21 DIAGNOSIS — I10 Essential (primary) hypertension: Secondary | ICD-10-CM | POA: Diagnosis not present

## 2017-02-22 DIAGNOSIS — G309 Alzheimer's disease, unspecified: Secondary | ICD-10-CM | POA: Diagnosis not present

## 2017-02-22 DIAGNOSIS — I10 Essential (primary) hypertension: Secondary | ICD-10-CM | POA: Diagnosis not present

## 2017-02-23 DIAGNOSIS — I10 Essential (primary) hypertension: Secondary | ICD-10-CM | POA: Diagnosis not present

## 2017-02-23 DIAGNOSIS — G309 Alzheimer's disease, unspecified: Secondary | ICD-10-CM | POA: Diagnosis not present

## 2017-02-24 DIAGNOSIS — G309 Alzheimer's disease, unspecified: Secondary | ICD-10-CM | POA: Diagnosis not present

## 2017-02-24 DIAGNOSIS — I10 Essential (primary) hypertension: Secondary | ICD-10-CM | POA: Diagnosis not present

## 2017-03-02 DEATH — deceased

## 2017-07-10 ENCOUNTER — Ambulatory Visit: Payer: Medicare Other | Admitting: Oncology
